# Patient Record
Sex: Female | Born: 1960
Health system: Southern US, Community
[De-identification: ages and names within clinical notes are randomized; demographics above are authoritative.]

## PROBLEM LIST (undated history)

## (undated) DIAGNOSIS — F909 Attention-deficit hyperactivity disorder, unspecified type: Secondary | ICD-10-CM

## (undated) DIAGNOSIS — K635 Polyp of colon: Secondary | ICD-10-CM

## (undated) DIAGNOSIS — K5790 Diverticulosis of intestine, part unspecified, without perforation or abscess without bleeding: Secondary | ICD-10-CM

## (undated) DIAGNOSIS — F32A Depression, unspecified: Secondary | ICD-10-CM

## (undated) DIAGNOSIS — F329 Major depressive disorder, single episode, unspecified: Secondary | ICD-10-CM

## (undated) DIAGNOSIS — E119 Type 2 diabetes mellitus without complications: Secondary | ICD-10-CM

## (undated) DIAGNOSIS — K219 Gastro-esophageal reflux disease without esophagitis: Secondary | ICD-10-CM

## (undated) DIAGNOSIS — K222 Esophageal obstruction: Secondary | ICD-10-CM

## (undated) DIAGNOSIS — F419 Anxiety disorder, unspecified: Secondary | ICD-10-CM

## (undated) DIAGNOSIS — I10 Essential (primary) hypertension: Secondary | ICD-10-CM

## (undated) DIAGNOSIS — E785 Hyperlipidemia, unspecified: Secondary | ICD-10-CM

## (undated) DIAGNOSIS — K449 Diaphragmatic hernia without obstruction or gangrene: Secondary | ICD-10-CM

## (undated) DIAGNOSIS — T7840XA Allergy, unspecified, initial encounter: Secondary | ICD-10-CM

## (undated) HISTORY — PX: COLONOSCOPY: SHX174

## (undated) HISTORY — DX: Diaphragmatic hernia without obstruction or gangrene: K44.9

## (undated) HISTORY — DX: Polyp of colon: K63.5

## (undated) HISTORY — DX: Allergy, unspecified, initial encounter: T78.40XA

## (undated) HISTORY — DX: Attention-deficit hyperactivity disorder, unspecified type: F90.9

## (undated) HISTORY — DX: Esophageal obstruction: K22.2

## (undated) HISTORY — DX: Major depressive disorder, single episode, unspecified: F32.9

## (undated) HISTORY — DX: Essential (primary) hypertension: I10

## (undated) HISTORY — PX: UPPER GASTROINTESTINAL ENDOSCOPY: SHX188

## (undated) HISTORY — DX: Type 2 diabetes mellitus without complications: E11.9

## (undated) HISTORY — DX: Hyperlipidemia, unspecified: E78.5

## (undated) HISTORY — PX: POLYPECTOMY: SHX149

## (undated) HISTORY — DX: Depression, unspecified: F32.A

## (undated) HISTORY — DX: Diverticulosis of intestine, part unspecified, without perforation or abscess without bleeding: K57.90

## (undated) HISTORY — DX: Anxiety disorder, unspecified: F41.9

## (undated) HISTORY — DX: Gastro-esophageal reflux disease without esophagitis: K21.9

## (undated) HISTORY — PX: CARPAL TUNNEL RELEASE: SHX101

---

## 2000-07-22 HISTORY — PX: BACK SURGERY: SHX140

## 2013-08-06 DIAGNOSIS — F4310 Post-traumatic stress disorder, unspecified: Secondary | ICD-10-CM | POA: Diagnosis not present

## 2013-08-11 DIAGNOSIS — F4310 Post-traumatic stress disorder, unspecified: Secondary | ICD-10-CM | POA: Diagnosis not present

## 2013-08-12 DIAGNOSIS — F319 Bipolar disorder, unspecified: Secondary | ICD-10-CM | POA: Diagnosis not present

## 2013-08-12 DIAGNOSIS — F4310 Post-traumatic stress disorder, unspecified: Secondary | ICD-10-CM | POA: Diagnosis not present

## 2013-08-12 DIAGNOSIS — F3110 Bipolar disorder, current episode manic without psychotic features, unspecified: Secondary | ICD-10-CM | POA: Diagnosis not present

## 2013-08-22 HISTORY — PX: SHOULDER SURGERY: SHX246

## 2013-08-26 DIAGNOSIS — F4310 Post-traumatic stress disorder, unspecified: Secondary | ICD-10-CM | POA: Diagnosis not present

## 2013-09-15 DIAGNOSIS — F3110 Bipolar disorder, current episode manic without psychotic features, unspecified: Secondary | ICD-10-CM | POA: Diagnosis not present

## 2013-09-15 DIAGNOSIS — F319 Bipolar disorder, unspecified: Secondary | ICD-10-CM | POA: Diagnosis not present

## 2013-09-22 DIAGNOSIS — F319 Bipolar disorder, unspecified: Secondary | ICD-10-CM | POA: Diagnosis not present

## 2013-09-29 DIAGNOSIS — R42 Dizziness and giddiness: Secondary | ICD-10-CM | POA: Diagnosis not present

## 2013-09-29 DIAGNOSIS — G541 Lumbosacral plexus disorders: Secondary | ICD-10-CM | POA: Diagnosis not present

## 2013-09-29 DIAGNOSIS — M542 Cervicalgia: Secondary | ICD-10-CM | POA: Diagnosis not present

## 2013-09-29 DIAGNOSIS — F519 Sleep disorder not due to a substance or known physiological condition, unspecified: Secondary | ICD-10-CM | POA: Diagnosis not present

## 2013-09-29 DIAGNOSIS — G603 Idiopathic progressive neuropathy: Secondary | ICD-10-CM | POA: Diagnosis not present

## 2013-09-29 DIAGNOSIS — M545 Low back pain, unspecified: Secondary | ICD-10-CM | POA: Diagnosis not present

## 2013-09-29 DIAGNOSIS — Z79899 Other long term (current) drug therapy: Secondary | ICD-10-CM | POA: Diagnosis not present

## 2013-09-29 DIAGNOSIS — G9009 Other idiopathic peripheral autonomic neuropathy: Secondary | ICD-10-CM | POA: Diagnosis not present

## 2013-09-29 DIAGNOSIS — R209 Unspecified disturbances of skin sensation: Secondary | ICD-10-CM | POA: Diagnosis not present

## 2013-09-29 DIAGNOSIS — M25519 Pain in unspecified shoulder: Secondary | ICD-10-CM | POA: Diagnosis not present

## 2013-10-01 ENCOUNTER — Ambulatory Visit (HOSPITAL_COMMUNITY): Admission: RE | Admit: 2013-10-01 | Payer: Medicare Other | Source: Ambulatory Visit

## 2013-10-01 ENCOUNTER — Emergency Department (HOSPITAL_COMMUNITY): Payer: Medicare Other

## 2013-10-01 ENCOUNTER — Emergency Department (HOSPITAL_COMMUNITY)
Admission: EM | Admit: 2013-10-01 | Discharge: 2013-10-01 | Discharge status: Against Medical Advice | Payer: Medicare Other | Attending: Emergency Medicine | Admitting: Emergency Medicine

## 2013-10-01 ENCOUNTER — Encounter (HOSPITAL_COMMUNITY): Payer: Self-pay | Admitting: Emergency Medicine

## 2013-10-01 DIAGNOSIS — G8929 Other chronic pain: Secondary | ICD-10-CM | POA: Diagnosis not present

## 2013-10-01 DIAGNOSIS — M25549 Pain in joints of unspecified hand: Secondary | ICD-10-CM | POA: Insufficient documentation

## 2013-10-01 DIAGNOSIS — Z9889 Other specified postprocedural states: Secondary | ICD-10-CM | POA: Diagnosis not present

## 2013-10-01 DIAGNOSIS — M542 Cervicalgia: Secondary | ICD-10-CM | POA: Insufficient documentation

## 2013-10-01 DIAGNOSIS — R209 Unspecified disturbances of skin sensation: Secondary | ICD-10-CM | POA: Insufficient documentation

## 2013-10-01 DIAGNOSIS — M25519 Pain in unspecified shoulder: Secondary | ICD-10-CM | POA: Diagnosis not present

## 2013-10-01 NOTE — ED Notes (Signed)
Pt states she started having the original pain in August,  Had surgery by Dr Carlyle Dolly in October,  For rotator cuff tear,  States she is worse now than prior to surgery,  Pt is states she is to go to  [place] [facility] follow up appts but doesn't have  Time because she is too busying working,  Pt is tearful in triage and figidty

## 2013-10-01 NOTE — ED Notes (Signed)
Patient left AMA after being seen by a medical provider. She did not alert staff that she was leaving.

## 2013-10-01 NOTE — ED Notes (Signed)
When ask pt about SI and HI she states she has to live for her 52 year old daughter and she is depressed but other than that she doesn't care if she is here,  As stated previous pt is crying and rambling

## 2013-10-02 NOTE — ED Provider Notes (Signed)
CSN: [id number]     Arrival date & time 10/01/13  2045 History   First MD Initiated Contact with Patient 10/01/13 2133     Chief Complaint  Patient presents with  . Shoulder Pain  . Hand Pain     (Consider location/radiation/quality/duration/timing/severity/associated sxs/prior Treatment) HPI Comments: Patient is 53 year old female who presents to the ED with complaints of bilateral shoulder and neck pain - she states that she just moved down here from [facility] where she had been being followed for shoulder pain - she states she had right shoulder rotator cuff repair there and that since the repair she has not had improvement in the pain,  She states that she then had to move here for work and that she has been having left shoulder pain as well.  She states that here she is seeing [facility] for her pain and they want to start doing injections.  She reports also chronic neck pain and numbness and states that the reason for her visit to the ED here is to get a MRI.  She denies any weakness, loss of control of bowels or bladder or change in her chronic pain.  She states she is taking tramadol for pain (did not list this on pain medications)  Patient is a 53 y.o. female presenting with shoulder pain and hand pain. The history is provided by the patient. No language interpreter was used.  Shoulder Pain This is a chronic problem. The current episode started in the past 7 days. The problem occurs constantly. The problem has been unchanged. Associated symptoms include arthralgias, neck pain and numbness. Pertinent negatives include no abdominal pain, anorexia, chest pain, chills, congestion, coughing, fatigue, headaches, joint swelling, nausea, rash, swollen glands, urinary symptoms, vertigo, vomiting or weakness. Nothing aggravates the symptoms. She has tried nothing for the symptoms. The treatment provided no relief.  Hand Pain Associated symptoms include arthralgias, neck pain and numbness. Pertinent  negatives include no abdominal pain, anorexia, chest pain, chills, congestion, coughing, fatigue, headaches, joint swelling, nausea, rash, swollen glands, urinary symptoms, vertigo, vomiting or weakness.    No past medical history on file. Past Surgical History  Procedure Laterality Date  . Shoulder surgery     History reviewed. No pertinent family history. History  Substance Use Topics  . Smoking status: Never Smoker   . Smokeless tobacco: Not on file  . Alcohol Use: No   OB History   Grav Para Term Preterm Abortions TAB SAB Ect Mult Living                 Review of Systems  Constitutional: Negative for chills and fatigue.  HENT: Negative for congestion.   Respiratory: Negative for cough.   Cardiovascular: Negative for chest pain.  Gastrointestinal: Negative for nausea, vomiting, abdominal pain and anorexia.  Musculoskeletal: Positive for arthralgias and neck pain. Negative for joint swelling.  Skin: Negative for rash.  Neurological: Positive for numbness. Negative for vertigo, weakness and headaches.  All other systems reviewed and are negative.      Allergies  Nitroglycerin  Home Medications  No current outpatient prescriptions on file. BP 164/95  Pulse 108  Temp(Src) 98 F (36.7 C) (Oral)  Resp 18  SpO2 99% Physical Exam  Nursing note and vitals reviewed. Constitutional: She is oriented to person, place, and time. She appears well-developed and well-nourished.  Pacing the room, tearful then angry  HENT:  Head: Normocephalic and atraumatic.  Mouth/Throat: Oropharynx is clear and moist.  Eyes:  Conjunctivae are normal. No scleral icterus.  Neck: Normal range of motion. Neck supple. No spinous process tenderness and no muscular tenderness present.  Pulmonary/Chest: Effort normal.  Musculoskeletal:       Right shoulder: She exhibits tenderness, bony tenderness and decreased strength. She exhibits normal range of motion, no spasm and normal pulse.       Left  shoulder: She exhibits tenderness and bony tenderness. She exhibits normal range of motion, normal pulse and normal strength.       Arms: Neurological: She is alert and oriented to person, place, and time. She exhibits normal muscle tone. Coordination normal.  Skin: Skin is warm and dry. No rash noted. No erythema. No pallor.  Psychiatric: She has a normal mood and affect. Her behavior is normal. Judgment and thought content normal.    ED Course  Procedures (including critical care time) Labs Review Labs Reviewed - No data to display Imaging Review No results found.   EKG Interpretation None      MDM   Chronic pain  Patient presents with bilateral shoulder pain and chronic neck pain - she is being followed by the [facility] for pain management and is unhappy with their management of the pain.  When I informed the patient that she would not be getting a MRI tonight, she became more angry and left AMA.  There are no alarming signs to suggest cauda equina or cord compression.    Idalia Needle Joelyn Oms, [name] 10/02/13 [phone]

## 2013-10-02 NOTE — ED Provider Notes (Signed)
Medical screening examination/treatment/procedure(s) were performed by non-physician practitioner and as supervising physician I was immediately available for consultation/collaboration.   EKG Interpretation None        Wandra Arthurs, MD 10/02/13 [phone]

## 2013-10-12 DIAGNOSIS — M5020 Other cervical disc displacement, unspecified cervical region: Secondary | ICD-10-CM | POA: Diagnosis not present

## 2013-10-13 DIAGNOSIS — M542 Cervicalgia: Secondary | ICD-10-CM | POA: Diagnosis not present

## 2013-10-13 DIAGNOSIS — Z79899 Other long term (current) drug therapy: Secondary | ICD-10-CM | POA: Diagnosis not present

## 2013-10-13 DIAGNOSIS — M5412 Radiculopathy, cervical region: Secondary | ICD-10-CM | POA: Diagnosis not present

## 2013-10-18 DIAGNOSIS — F319 Bipolar disorder, unspecified: Secondary | ICD-10-CM | POA: Diagnosis not present

## 2013-10-20 DIAGNOSIS — F4310 Post-traumatic stress disorder, unspecified: Secondary | ICD-10-CM | POA: Diagnosis not present

## 2013-10-22 DIAGNOSIS — M249 Joint derangement, unspecified: Secondary | ICD-10-CM | POA: Diagnosis not present

## 2013-10-22 DIAGNOSIS — M25519 Pain in unspecified shoulder: Secondary | ICD-10-CM | POA: Diagnosis not present

## 2013-10-28 DIAGNOSIS — Z09 Encounter for follow-up examination after completed treatment for conditions other than malignant neoplasm: Secondary | ICD-10-CM | POA: Diagnosis not present

## 2013-10-28 DIAGNOSIS — IMO0002 Reserved for concepts with insufficient information to code with codable children: Secondary | ICD-10-CM | POA: Diagnosis not present

## 2013-10-28 DIAGNOSIS — M75100 Unspecified rotator cuff tear or rupture of unspecified shoulder, not specified as traumatic: Secondary | ICD-10-CM | POA: Diagnosis not present

## 2013-10-28 DIAGNOSIS — R000 Tachycardia, unspecified: Secondary | ICD-10-CM | POA: Diagnosis not present

## 2013-10-29 DIAGNOSIS — Z01818 Encounter for other preprocedural examination: Secondary | ICD-10-CM | POA: Diagnosis not present

## 2013-10-29 DIAGNOSIS — IMO0002 Reserved for concepts with insufficient information to code with codable children: Secondary | ICD-10-CM | POA: Diagnosis not present

## 2013-10-29 DIAGNOSIS — M75100 Unspecified rotator cuff tear or rupture of unspecified shoulder, not specified as traumatic: Secondary | ICD-10-CM | POA: Diagnosis not present

## 2013-11-01 DIAGNOSIS — IMO0002 Reserved for concepts with insufficient information to code with codable children: Secondary | ICD-10-CM | POA: Diagnosis not present

## 2013-11-01 DIAGNOSIS — M75100 Unspecified rotator cuff tear or rupture of unspecified shoulder, not specified as traumatic: Secondary | ICD-10-CM | POA: Diagnosis not present

## 2013-11-01 DIAGNOSIS — Z01818 Encounter for other preprocedural examination: Secondary | ICD-10-CM | POA: Diagnosis not present

## 2013-11-02 DIAGNOSIS — E785 Hyperlipidemia, unspecified: Secondary | ICD-10-CM | POA: Diagnosis not present

## 2013-11-02 DIAGNOSIS — M719 Bursopathy, unspecified: Secondary | ICD-10-CM | POA: Diagnosis not present

## 2013-11-02 DIAGNOSIS — I10 Essential (primary) hypertension: Secondary | ICD-10-CM | POA: Diagnosis not present

## 2013-11-02 DIAGNOSIS — M75120 Complete rotator cuff tear or rupture of unspecified shoulder, not specified as traumatic: Secondary | ICD-10-CM | POA: Diagnosis not present

## 2013-11-02 DIAGNOSIS — Z79899 Other long term (current) drug therapy: Secondary | ICD-10-CM | POA: Diagnosis not present

## 2013-11-02 DIAGNOSIS — F341 Dysthymic disorder: Secondary | ICD-10-CM | POA: Diagnosis not present

## 2013-11-02 DIAGNOSIS — S43439A Superior glenoid labrum lesion of unspecified shoulder, initial encounter: Secondary | ICD-10-CM | POA: Diagnosis not present

## 2013-11-02 DIAGNOSIS — S46819A Strain of other muscles, fascia and tendons at shoulder and upper arm level, unspecified arm, initial encounter: Secondary | ICD-10-CM | POA: Diagnosis not present

## 2013-11-02 DIAGNOSIS — E119 Type 2 diabetes mellitus without complications: Secondary | ICD-10-CM | POA: Diagnosis not present

## 2013-11-02 DIAGNOSIS — M67919 Unspecified disorder of synovium and tendon, unspecified shoulder: Secondary | ICD-10-CM | POA: Diagnosis not present

## 2013-11-09 DIAGNOSIS — M719 Bursopathy, unspecified: Secondary | ICD-10-CM | POA: Diagnosis not present

## 2013-11-09 DIAGNOSIS — M25819 Other specified joint disorders, unspecified shoulder: Secondary | ICD-10-CM | POA: Diagnosis not present

## 2013-11-09 DIAGNOSIS — Z79899 Other long term (current) drug therapy: Secondary | ICD-10-CM | POA: Diagnosis not present

## 2013-11-09 DIAGNOSIS — F3289 Other specified depressive episodes: Secondary | ICD-10-CM | POA: Diagnosis not present

## 2013-11-09 DIAGNOSIS — M24119 Other articular cartilage disorders, unspecified shoulder: Secondary | ICD-10-CM | POA: Diagnosis not present

## 2013-11-09 DIAGNOSIS — I10 Essential (primary) hypertension: Secondary | ICD-10-CM | POA: Diagnosis not present

## 2013-11-09 DIAGNOSIS — E119 Type 2 diabetes mellitus without complications: Secondary | ICD-10-CM | POA: Diagnosis not present

## 2013-11-09 DIAGNOSIS — S43499A Other sprain of unspecified shoulder joint, initial encounter: Secondary | ICD-10-CM | POA: Diagnosis not present

## 2013-11-09 DIAGNOSIS — E785 Hyperlipidemia, unspecified: Secondary | ICD-10-CM | POA: Diagnosis not present

## 2013-11-09 DIAGNOSIS — M67919 Unspecified disorder of synovium and tendon, unspecified shoulder: Secondary | ICD-10-CM | POA: Diagnosis not present

## 2013-11-09 DIAGNOSIS — Z91040 Latex allergy status: Secondary | ICD-10-CM | POA: Diagnosis not present

## 2013-11-09 DIAGNOSIS — F329 Major depressive disorder, single episode, unspecified: Secondary | ICD-10-CM | POA: Diagnosis not present

## 2013-12-09 DIAGNOSIS — N912 Amenorrhea, unspecified: Secondary | ICD-10-CM | POA: Diagnosis not present

## 2013-12-09 DIAGNOSIS — Z0000 Encounter for general adult medical examination without abnormal findings: Secondary | ICD-10-CM | POA: Diagnosis not present

## 2013-12-09 DIAGNOSIS — E119 Type 2 diabetes mellitus without complications: Secondary | ICD-10-CM | POA: Diagnosis not present

## 2013-12-09 DIAGNOSIS — E785 Hyperlipidemia, unspecified: Secondary | ICD-10-CM | POA: Diagnosis not present

## 2013-12-09 DIAGNOSIS — R319 Hematuria, unspecified: Secondary | ICD-10-CM | POA: Diagnosis not present

## 2013-12-09 DIAGNOSIS — R109 Unspecified abdominal pain: Secondary | ICD-10-CM | POA: Diagnosis not present

## 2013-12-23 DIAGNOSIS — L909 Atrophic disorder of skin, unspecified: Secondary | ICD-10-CM | POA: Diagnosis not present

## 2013-12-23 DIAGNOSIS — F341 Dysthymic disorder: Secondary | ICD-10-CM | POA: Diagnosis not present

## 2013-12-23 DIAGNOSIS — E782 Mixed hyperlipidemia: Secondary | ICD-10-CM | POA: Diagnosis not present

## 2013-12-23 DIAGNOSIS — L919 Hypertrophic disorder of the skin, unspecified: Secondary | ICD-10-CM | POA: Diagnosis not present

## 2013-12-23 DIAGNOSIS — E119 Type 2 diabetes mellitus without complications: Secondary | ICD-10-CM | POA: Diagnosis not present

## 2013-12-27 DIAGNOSIS — M542 Cervicalgia: Secondary | ICD-10-CM | POA: Diagnosis not present

## 2013-12-27 DIAGNOSIS — M5430 Sciatica, unspecified side: Secondary | ICD-10-CM | POA: Diagnosis not present

## 2013-12-27 DIAGNOSIS — M5412 Radiculopathy, cervical region: Secondary | ICD-10-CM | POA: Diagnosis not present

## 2013-12-27 DIAGNOSIS — G540 Brachial plexus disorders: Secondary | ICD-10-CM | POA: Diagnosis not present

## 2013-12-27 DIAGNOSIS — Z79899 Other long term (current) drug therapy: Secondary | ICD-10-CM | POA: Diagnosis not present

## 2013-12-27 DIAGNOSIS — M545 Low back pain, unspecified: Secondary | ICD-10-CM | POA: Diagnosis not present

## 2013-12-27 DIAGNOSIS — M533 Sacrococcygeal disorders, not elsewhere classified: Secondary | ICD-10-CM | POA: Diagnosis not present

## 2013-12-27 DIAGNOSIS — G894 Chronic pain syndrome: Secondary | ICD-10-CM | POA: Diagnosis not present

## 2014-01-19 ENCOUNTER — Ambulatory Visit (INDEPENDENT_AMBULATORY_CARE_PROVIDER_SITE_OTHER): Payer: Medicare Other | Admitting: Family Medicine

## 2014-01-19 VITALS — BP 112/76 | HR 84 | Temp 97.9°F | Resp 16 | Ht 58.5 in | Wt <= 1120 oz

## 2014-01-19 DIAGNOSIS — R300 Dysuria: Secondary | ICD-10-CM | POA: Diagnosis not present

## 2014-01-19 DIAGNOSIS — N3001 Acute cystitis with hematuria: Secondary | ICD-10-CM

## 2014-01-19 DIAGNOSIS — N3000 Acute cystitis without hematuria: Secondary | ICD-10-CM | POA: Diagnosis not present

## 2014-01-19 LAB — POCT UA - MICROSCOPIC ONLY
CASTS, UR, LPF, POC: NEGATIVE
Crystals, Ur, HPF, POC: NEGATIVE
Mucus, UA: NEGATIVE
Yeast, UA: NEGATIVE

## 2014-01-19 LAB — POCT URINALYSIS DIPSTICK
Bilirubin, UA: NEGATIVE
GLUCOSE UA: NEGATIVE
Ketones, UA: NEGATIVE
NITRITE UA: NEGATIVE
Protein, UA: 30
Spec Grav, UA: 1.02
UROBILINOGEN UA: 0.2
pH, UA: 5

## 2014-01-19 MED ORDER — CEPHALEXIN 500 MG PO CAPS: 500.0000 mg | ORAL_CAPSULE | Freq: Two times a day (BID) | ORAL | Status: DC

## 2014-01-19 NOTE — Progress Notes (Signed)
[facility] [address], [place] [address] [phone]  Date:  01/19/2014   Name:  Amy Glass   DOB:  Apr 17, 1961   MRN:  [id number]  PCP:  No PCP Per Patient    Chief Complaint: Urinary Tract Infection   History of Present Illness:  Amy Glass is a 53 y.o. very pleasant female patient who presents with the following:  Here today as a new patient for possible UTI.  She has noted burning with urination for about 3 days. She has also noted frequency.  She has noted hematuria- trace of blood on TP after wiping- as well.  She did have a menstrual period on 12/30/13.  No nausea, vomiting, no fever.  She did have abdominal pain a couple of days ago, now resolved.  No vaginal sx  History of DM managed by the [facility].   Otherwise doing well today  There are no active problems to display for this patient.   Past Medical History  Diagnosis Date  . Anxiety   . Depression   . Diabetes mellitus without complication     Past Surgical History  Procedure Laterality Date  . Shoulder surgery    . Cesarean section      History  Substance Use Topics  . Smoking status: Never Smoker   . Smokeless tobacco: Not on file  . Alcohol Use: No    Family History  Problem Relation Age of Onset  . Diabetes Mother   . Heart disease Mother   . Hyperlipidemia Mother   . Hypertension Mother     Allergies  Allergen Reactions  . Nitroglycerin Other (See Comments)    Migraines, (only tried nitro patch. Has never tried the pills)    Medication list has been reviewed and updated.  No current outpatient prescriptions on file prior to visit.   No current facility-administered medications on file prior to visit.    Review of Systems:  As per HPI- otherwise negative.   Physical Examination: Filed Vitals:   01/19/14 1057  BP: 112/76  Pulse: 84  Temp: 97.9 F (36.6 C)  Resp: 16   Filed Vitals:   01/19/14 1057  Height: 4' 10.5" (1.486 m)  Weight:  15 lb 6.4 oz (6.985 kg)   Body mass index is 3.16 kg/(m^2). Ideal Body Weight: Weight in (lb) to have BMI = 25: 121.4  Noted erroneous weight entry above.  Would estimate that she weighs around 150 lbs GEN: WDWN, NAD, Non-toxic, A & O x 3 HEENT: Atraumatic, Normocephalic. Neck supple. No masses, No LAD. Ears and Nose: No external deformity. CV: RRR, No M/G/R. No JVD. No thrill. No extra heart sounds. PULM: CTA B, no wheezes, crackles, rhonchi. No retractions. No resp. distress. No accessory muscle use. ABD: S, NT, ND. No rebound. No HSM.  No CVA tenderness  EXTR: No c/c/e NEURO Normal gait.  PSYCH: Normally interactive. Conversant. Not depressed or anxious appearing.  Calm demeanor.    Results for orders placed in visit on 01/19/14  POCT URINALYSIS DIPSTICK      Result Value Ref Range   Color, UA yellow     Clarity, UA clear     Glucose, UA neg     Bilirubin, UA neg     Ketones, UA neg     Spec Grav, UA 1.020     Blood, UA moderate     pH, UA 5.0     Protein, UA 30     Urobilinogen,  UA 0.2     Nitrite, UA neg     Leukocytes, UA Trace    POCT UA - MICROSCOPIC ONLY      Result Value Ref Range   WBC, Ur, HPF, POC 5-7     RBC, urine, microscopic 1-3     Bacteria, U Microscopic trace     Mucus, UA neg     Epithelial cells, urine per micros 0-1     Crystals, Ur, HPF, POC neg     Casts, Ur, LPF, POC neg     Yeast, UA neg      Assessment and Plan: Acute cystitis with hematuria  Burning with urination - Plan: POCT urinalysis dipstick, POCT UA - Microscopic Only, cephALEXin (KEFLEX) 500 MG capsule, Urine culture  Treat for likely UTI with keflex.  She does describe some blood on TP after wiping so will need follow-up if ucx is negative.   See patient instructions for more details.     Signed Lamar Blinks, MD

## 2014-01-19 NOTE — Patient Instructions (Signed)
We are going to treat you for a UTI with keflex (antibiotic)- take this twice a day for one week.  At the drug store purchase some pyridium (azo is one brand name) to help with your discomfort.  It will make your urine orange!  Let me know if you do not feel better in the next couple of days Sooner if worse.

## 2014-01-22 ENCOUNTER — Encounter: Payer: Self-pay | Admitting: Family Medicine

## 2014-01-22 LAB — URINE CULTURE

## 2014-01-26 DIAGNOSIS — E119 Type 2 diabetes mellitus without complications: Secondary | ICD-10-CM | POA: Diagnosis not present

## 2014-01-26 DIAGNOSIS — E785 Hyperlipidemia, unspecified: Secondary | ICD-10-CM | POA: Diagnosis not present

## 2014-03-14 DIAGNOSIS — F319 Bipolar disorder, unspecified: Secondary | ICD-10-CM | POA: Diagnosis not present

## 2014-03-29 DIAGNOSIS — R82998 Other abnormal findings in urine: Secondary | ICD-10-CM | POA: Diagnosis not present

## 2014-03-29 DIAGNOSIS — N390 Urinary tract infection, site not specified: Secondary | ICD-10-CM | POA: Diagnosis not present

## 2014-06-06 DIAGNOSIS — F319 Bipolar disorder, unspecified: Secondary | ICD-10-CM | POA: Diagnosis not present

## 2014-07-19 DIAGNOSIS — E139 Other specified diabetes mellitus without complications: Secondary | ICD-10-CM | POA: Diagnosis not present

## 2014-07-19 DIAGNOSIS — E0840 Diabetes mellitus due to underlying condition with diabetic neuropathy, unspecified: Secondary | ICD-10-CM | POA: Diagnosis not present

## 2014-07-19 DIAGNOSIS — D229 Melanocytic nevi, unspecified: Secondary | ICD-10-CM | POA: Diagnosis not present

## 2014-07-19 DIAGNOSIS — E785 Hyperlipidemia, unspecified: Secondary | ICD-10-CM | POA: Diagnosis not present

## 2014-07-20 ENCOUNTER — Other Ambulatory Visit: Payer: Self-pay | Admitting: Specialist

## 2014-07-20 ENCOUNTER — Other Ambulatory Visit: Payer: Self-pay

## 2014-07-20 DIAGNOSIS — Z1231 Encounter for screening mammogram for malignant neoplasm of breast: Secondary | ICD-10-CM

## 2014-07-26 ENCOUNTER — Ambulatory Visit
Admission: RE | Admit: 2014-07-26 | Discharge: 2014-07-26 | Disposition: A | Discharge status: Home / Self Care | Payer: Medicare Other | Source: Ambulatory Visit | Attending: Specialist | Admitting: Specialist

## 2014-07-26 ENCOUNTER — Encounter (INDEPENDENT_AMBULATORY_CARE_PROVIDER_SITE_OTHER): Payer: Self-pay

## 2014-07-26 ENCOUNTER — Ambulatory Visit: Payer: Medicare Other

## 2014-07-26 DIAGNOSIS — Z1231 Encounter for screening mammogram for malignant neoplasm of breast: Secondary | ICD-10-CM

## 2014-08-02 DIAGNOSIS — E119 Type 2 diabetes mellitus without complications: Secondary | ICD-10-CM | POA: Diagnosis not present

## 2014-08-02 DIAGNOSIS — E785 Hyperlipidemia, unspecified: Secondary | ICD-10-CM | POA: Diagnosis not present

## 2014-08-02 DIAGNOSIS — D229 Melanocytic nevi, unspecified: Secondary | ICD-10-CM | POA: Diagnosis not present

## 2014-08-02 DIAGNOSIS — R1310 Dysphagia, unspecified: Secondary | ICD-10-CM | POA: Diagnosis not present

## 2014-08-09 DIAGNOSIS — D229 Melanocytic nevi, unspecified: Secondary | ICD-10-CM | POA: Diagnosis not present

## 2014-08-09 DIAGNOSIS — K220 Achalasia of cardia: Secondary | ICD-10-CM | POA: Diagnosis not present

## 2014-08-23 DIAGNOSIS — N951 Menopausal and female climacteric states: Secondary | ICD-10-CM | POA: Diagnosis not present

## 2014-08-23 DIAGNOSIS — L918 Other hypertrophic disorders of the skin: Secondary | ICD-10-CM | POA: Diagnosis not present

## 2014-08-23 DIAGNOSIS — Z124 Encounter for screening for malignant neoplasm of cervix: Secondary | ICD-10-CM | POA: Diagnosis not present

## 2014-08-23 DIAGNOSIS — R1310 Dysphagia, unspecified: Secondary | ICD-10-CM | POA: Diagnosis not present

## 2014-09-13 DIAGNOSIS — K760 Fatty (change of) liver, not elsewhere classified: Secondary | ICD-10-CM | POA: Diagnosis not present

## 2014-09-13 DIAGNOSIS — M549 Dorsalgia, unspecified: Secondary | ICD-10-CM | POA: Diagnosis not present

## 2014-09-14 DIAGNOSIS — K5900 Constipation, unspecified: Secondary | ICD-10-CM | POA: Diagnosis not present

## 2014-09-14 DIAGNOSIS — F319 Bipolar disorder, unspecified: Secondary | ICD-10-CM | POA: Diagnosis not present

## 2014-09-14 DIAGNOSIS — R109 Unspecified abdominal pain: Secondary | ICD-10-CM | POA: Diagnosis not present

## 2014-09-14 DIAGNOSIS — K760 Fatty (change of) liver, not elsewhere classified: Secondary | ICD-10-CM | POA: Diagnosis not present

## 2014-09-21 ENCOUNTER — Encounter: Payer: Self-pay | Admitting: Specialist

## 2014-09-25 DIAGNOSIS — R05 Cough: Secondary | ICD-10-CM | POA: Diagnosis not present

## 2014-09-25 DIAGNOSIS — B9789 Other viral agents as the cause of diseases classified elsewhere: Secondary | ICD-10-CM | POA: Diagnosis not present

## 2014-09-25 DIAGNOSIS — R0989 Other specified symptoms and signs involving the circulatory and respiratory systems: Secondary | ICD-10-CM | POA: Diagnosis not present

## 2014-09-25 DIAGNOSIS — I10 Essential (primary) hypertension: Secondary | ICD-10-CM | POA: Diagnosis not present

## 2014-09-25 DIAGNOSIS — J069 Acute upper respiratory infection, unspecified: Secondary | ICD-10-CM | POA: Diagnosis not present

## 2014-10-30 DIAGNOSIS — M542 Cervicalgia: Secondary | ICD-10-CM | POA: Diagnosis not present

## 2014-10-30 DIAGNOSIS — M545 Low back pain: Secondary | ICD-10-CM | POA: Diagnosis not present

## 2014-11-09 ENCOUNTER — Emergency Department (HOSPITAL_COMMUNITY)
Admission: EM | Admit: 2014-11-09 | Discharge: 2014-11-09 | Disposition: A | Discharge status: Home / Self Care | Payer: Medicare Other | Attending: Emergency Medicine | Admitting: Emergency Medicine

## 2014-11-09 ENCOUNTER — Telehealth: Payer: Self-pay | Admitting: *Deleted

## 2014-11-09 ENCOUNTER — Encounter (HOSPITAL_COMMUNITY): Payer: Self-pay | Admitting: Emergency Medicine

## 2014-11-09 DIAGNOSIS — F419 Anxiety disorder, unspecified: Secondary | ICD-10-CM | POA: Diagnosis not present

## 2014-11-09 DIAGNOSIS — G8929 Other chronic pain: Secondary | ICD-10-CM | POA: Insufficient documentation

## 2014-11-09 DIAGNOSIS — F329 Major depressive disorder, single episode, unspecified: Secondary | ICD-10-CM | POA: Diagnosis not present

## 2014-11-09 DIAGNOSIS — M545 Low back pain: Secondary | ICD-10-CM | POA: Insufficient documentation

## 2014-11-09 DIAGNOSIS — E119 Type 2 diabetes mellitus without complications: Secondary | ICD-10-CM | POA: Insufficient documentation

## 2014-11-09 DIAGNOSIS — G5601 Carpal tunnel syndrome, right upper limb: Secondary | ICD-10-CM | POA: Insufficient documentation

## 2014-11-09 DIAGNOSIS — Z79899 Other long term (current) drug therapy: Secondary | ICD-10-CM | POA: Diagnosis not present

## 2014-11-09 MED ORDER — CYCLOBENZAPRINE HCL 10 MG PO TABS: 10.0000 mg | ORAL_TABLET | Freq: Two times a day (BID) | ORAL | Status: DC | PRN

## 2014-11-09 MED ORDER — MELOXICAM 15 MG PO TABS: 15.0000 mg | ORAL_TABLET | Freq: Every day | ORAL | Status: DC

## 2014-11-09 NOTE — Discharge Instructions (Signed)
1. Medications: robaxin, naproxyn, vicodin, usual home medications 2. Treatment: rest, drink plenty of fluids, gentle stretching as discussed, alternate ice and heat 3. Follow Up: Please followup with your primary doctor in 3 days for discussion of your diagnoses and further evaluation after today's visit; if you do not have a primary care doctor use the resource guide provided to find one;  Return to the ER for worsening back pain, difficulty walking, loss of bowel or bladder control or other concerning symptoms     Back Exercises Back exercises help treat and prevent back injuries. The goal of back exercises is to increase the strength of your abdominal and back muscles and the flexibility of your back. These exercises should be started when you no longer have back pain. Back exercises include:  Pelvic Tilt. Lie on your back with your knees bent. Tilt your pelvis until the lower part of your back is against the floor. Hold this position 5 to 10 sec and repeat 5 to 10 times.  Knee to Chest. Pull first 1 knee up against your chest and hold for 20 to 30 seconds, repeat this with the other knee, and then both knees. This may be done with the other leg straight or bent, whichever feels better.  Sit-Ups or Curl-Ups. Bend your knees 90 degrees. Start with tilting your pelvis, and do a partial, slow sit-up, lifting your trunk only 30 to 45 degrees off the floor. Take at least 2 to 3 seconds for each sit-up. Do not do sit-ups with your knees out straight. If partial sit-ups are difficult, simply do the above but with only tightening your abdominal muscles and holding it as directed.  Hip-Lift. Lie on your back with your knees flexed 90 degrees. Push down with your feet and shoulders as you raise your hips a couple inches off the floor; hold for 10 seconds, repeat 5 to 10 times.  Back arches. Lie on your stomach, propping yourself up on bent elbows. Slowly press on your hands, causing an arch in your low  back. Repeat 3 to 5 times. Any initial stiffness and discomfort should lessen with repetition over time.  Shoulder-Lifts. Lie face down with arms beside your body. Keep hips and torso pressed to floor as you slowly lift your head and shoulders off the floor. Do not overdo your exercises, especially in the beginning. Exercises may cause you some mild back discomfort which lasts for a few minutes; however, if the pain is more severe, or lasts for more than 15 minutes, do not continue exercises until you see your caregiver. Improvement with exercise therapy for back problems is slow.  See your caregivers for assistance with developing a proper back exercise program. Document Released: 08/15/2004 Document Revised: 09/30/2011 Document Reviewed: 05/09/2011 [facility] Patient Information 2015 [place], [place]. This information is not intended to replace advice given to you by your health care provider. Make sure you discuss any questions you have with your health care provider.    Emergency Department Resource Guide 1) Find a Doctor and Pay Out of Pocket Although you won't have to find out who is covered by your insurance plan, it is a good idea to ask around and get recommendations. You will then need to call the office and see if the doctor you have chosen will accept you as a new patient and what types of options they offer for patients who are self-pay. Some doctors offer discounts or will set up payment plans for their patients who do not  have insurance, but you will need to ask so you aren't surprised when you get to your appointment.  2) Contact Your Local Health Department Not all health departments have doctors that can see patients for sick visits, but many do, so it is worth a call to see if yours does. If you don't know where your local health department is, you can check in your phone book. The CDC also has a tool to help you locate your state's health department, and many state websites also have  listings of all of their local health departments.  3) Find a [facility] If your illness is not likely to be very severe or complicated, you may want to try a walk in clinic. These are popping up all over the country in pharmacies, drugstores, and shopping centers. They're usually staffed by nurse practitioners or physician assistants that have been trained to treat common illnesses and complaints. They're usually fairly quick and inexpensive. However, if you have serious medical issues or chronic medical problems, these are probably not your best option.  No Primary Care Doctor: - Call Health Connect at  [phone] - they can help you locate a primary care doctor that  accepts your insurance, provides certain services, etc. - Physician Referral Service- [phone]  Chronic Pain Problems: Organization         Address  Phone   Notes  [facility]  [phone] Patients need to be referred by their primary care doctor.   Medication Assistance: Organization         Address  Phone   Notes  [facility] Medication [facility] [address] [place], [place] [address] [phone] --Must be a resident of [facility] -- Must have NO insurance coverage whatsoever (no Medicaid/ Medicare, etc.) -- The pt. MUST have a primary care doctor that directs their care regularly and follows them in the community   [facility]ist  [phone]   [employer]  [phone]    Agencies that provide inexpensive medical care: Organization         Address  Phone   Notes  [place]  [phone]   [facility]    [phone]   [facility] [place], [place] [address] [phone]   [place] [address] [place] [phone]   Planned Parenthood    [phone]   [facility]    [phone]   [place] and [place]  [address], [place] Phone:  [phone], Fax:  [phone] Hours of Operation:  9 am - 6 pm, M-F.  Also accepts Medicaid/Medicare and self-pay.  [facility] for [place], [address],  Phone: [phone], Fax: [phone]. Hours of Operation:  8:30 am - 5:30 pm, M-F.  Also accepts Medicaid and self-pay.  [facility] [address] [place] Phone: [phone]   [place], [place], [place] [phone], Ext. [phone] Mondays & Thursdays: 7-9 AM.  First 15 patients are seen on a first come, first serve basis.    [place] Providers:  Organization         Address  Phone   Notes  [facility] [address],  [phone] Also accepts self-pay patients.  [place]  [address] Dolores Patty [place], [place]  [phone]   [place], [address], [place] [phone]   [place] [address], [place] [phone]   Lucianne Lei [address], [place]   [phone] Only accepts [place] Access [place] patients after they have their name applied to their card.   Self-Pay (no insurance) in [facility]:  Organization         Address  Phone   Notes  Sickle Cell Patients, [facility] [place] [phone]   [facility] [place] [phone]   [facility] [place]  [place], [place], [place] [phone]   [facility]/Dr. Osei-Bonsu  [address] [place] or [address], [place] [phone] Phone number for both [place] and [place] locations is the same.  Urgent Medical and [facility] [address], [place] [phone]   [facility] [address],  [place] or [address] [phone]   [facility] [address] [place] [phone], phone; [phone], fax Sees patients 1st and 3rd Saturday of every month.  Must not qualify for public or private insurance (i.e. Medicaid, Medicare, [place] Health Choice, Veterans' Benefits)  Household income should be no more than 200% of the poverty level The clinic cannot treat you if you are pregnant or think you are pregnant  Sexually transmitted diseases are not treated at the clinic.    Dental Care: Organization         Address  Phone  Notes  [facility] Department of [facility] [phone] Accepts children up to age 70 who are enrolled in [place] or [place]; pregnant women with a Medicaid card; and children who have applied for Medicaid or [place] Health Choice, but were declined, whose parents can pay a reduced fee at time of service.  [facility] Department of [facility]  [address], [place] [phone] Accepts children up to age 49 who are enrolled in [place] or [place]; pregnant women with a Medicaid card; and children who have applied for Medicaid or  Health Choice, but were declined, whose parents can pay a reduced fee at time of service.  [place] Adult Dental Access PROGRAM  [place] [phone] Patients are seen by appointment only. Walk-ins are not accepted. [place] will see patients 58 years of age and older. Monday - Tuesday (8am-5pm) Most Wednesdays (8:30-5pm) $30 per visit, cash only  [facility] Adult Dental Access PROGRAM  [address], [facility] [phone] Patients are seen by appointment only. Walk-ins are not accepted. [place] will see patients 72 years of age and older. One Wednesday Evening (Monthly: Volunteer Based).  $30 per visit, cash only  [place]  [phone] for adults; Children under age 37, call Graduate Pediatric Dentistry at [phone]. Children aged 3-14, please call [phone] to request a pediatric application.  Dental services are provided in all areas of dental care including fillings, crowns and bridges, complete and partial dentures, implants, gum treatment, root canals, and extractions. Preventive care is also provided. Treatment is provided to both adults and children. Patients are selected  via a lottery and there is often a waiting list.   [facility] [address], [place]  [phone] [email or web address]   [facility] [address] [place], [place] [phone], Ext. [phone] Second and Fourth Thursday of each month, opens at 6:30 AM; Clinic ends at 9 AM.  Patients are seen on a first-come first-served basis, and a limited number are seen during each clinic.   [facility]  [address] Hillard Danker [place], [place] [phone]   Eligibility Requirements You must have lived in [place], [place], or [place] counties for at least the last three months.   You cannot be eligible for state or federal sponsored [employer], including [employer], [place], or [employer].   You generally cannot be eligible for healthcare insurance through your employer.    How to apply: Eligibility screenings are held every Tuesday and Wednesday afternoon from 1:00 pm until 4:00 pm. You do not need an appointment for the interview!  [facility] [address], [place], [place]   [place]  [place] Department  [place]  [phone]    Behavioral Health Resources in the Community: Intensive Outpatient Programs Organization         Address  Phone  Notes  [place] [facility]. [address] [place], [place] [phone]     [facility] Outpatient [address] [name], [place]   ADS: Alcohol & Drug S[facility] [address] [place], [place]   [place] [address],  [place], [place] or [phone]   Substance Abuse Resources Organization         Address  Phone  Notes  Alcohol and Drug Services  [phone]   [place]  [phone]   [facility] [place]   Chinita Pester  [phone]   Residential & Outpatient Substance Abuse Program  [phone]   Psychological Services Organization         Address  Phone  Notes  [facility] [place]  [place]  [phone]   [place] [address], [place] or [phone]    Mobile Crisis Teams Organization         Address  Phone  Notes  [facility]atives, Mobile Crisis Care Unit  [phone]   [facility]  [address] [place], [place]   [place] [address], [place] [phone]    Self-Help/Support Groups Organization         Address  Phone             Notes  [name]. of [place] - variety of support groups  [place] Call for more information  Narcotics Anonymous (NA), [facility] [address], [employer] [place]  2 meetings at this location   Special educational needs teacher         Address  Phone  Notes  ASAP Residential Treatment [place],    [place]  [phone]   [facility]  [address] [place] [address]24097, [place], [place]   [place], [place] [phone] Admissions: 8am-3pm M-F  Incentives Substance [place] [address],    [place], [place]   [facility] [name]  Jadene Pierini [place], [place]   [facility] [address],  [place],  [place]   [facility] - Intensive Outpatient [address] Kristeen [address], [place], [place]   [facility] ([place].) [place].,  [place], [place] [phone] or [phone]   [facility] Services (RTS) [address], [place], [place] Accepts Medicaid  [facility] [place] [address]  [place] [phone] Substance Abuse/Addiction Treatment   [facility] [place] Organization         Address  Phone  Notes  [facility]  [phone]   Domenic Schwab, PhD [name] Arlis Porta [place], [place]   [phone] or [phone]   [place], [place] [phone]   [facility] [address], [place], [place] [phone] Insurance/Medicaid/sponsorship through [facility] and Families [address] [place]                                    [place], [place] [phone] [place] [address], [place] [phone]    Dr. Adele Schilder  [phone]   [facility] Dept. 1) [address] [place] 2) [place] 3)  [place] [address], [place] [phone]  [phone]   [place] [phone] or [phone] (After Hours)

## 2014-11-09 NOTE — Telephone Encounter (Signed)
Patient called to make a new patient appointment.  Transferred her to scheduler

## 2014-11-09 NOTE — ED Provider Notes (Signed)
CSN: [id number]     Arrival date & time 11/09/14  1511 History   This chart is scribed for non-physician practitioner, Abigail Butts, PA-C, working with Noemi Chapel, MD by Chester Holstein, ED Scribe.  This patient was seen in room TR05C/TR05C and the patient's care was started 6:05 PM.      Chief Complaint  Patient presents with  . Back Pain     Patient is a 54 y.o. female presenting with back pain. The history is provided by the patient and medical records. No language interpreter was used.  Back Pain Associated symptoms: numbness   Associated symptoms: no abdominal pain, no bladder incontinence, no bowel incontinence, no chest pain, no dysuria, no fever, no headaches, no paresthesias, no perianal numbness and no weakness    HPI Comments: Amy Glass is a 54 y.o. female with PMHx of DM and anxiety who presents to the Emergency Department complaining of chronic back pain with radiation to neck worsening over the last month. Pt states she had back surgery in 2002 and bilateral shoulder surgery last year. Pt notes associated waxing and waning numbness and tingling in right hand for 4 months. Pt is right handed. Pt has an appointment to see an orthopedist on 11/21/14. Pt has not been taking any muscle relaxers. Pt denies h/o CA, IVDA, use of blood thinners, and prolonged steroid use. Pt denies numbness and tingling in bilateral LEs, weakness, fever, chills, injury to neck, fall, and bowel or bladder incontinence.  Pt does not have a PCP currently.  Patient reports she spends most of her day at work bending and lifting. She reports that after work her back hurts significantly more.    Past Medical History  Diagnosis Date  . Anxiety   . Depression   . Diabetes mellitus without complication    Past Surgical History  Procedure Laterality Date  . Shoulder surgery    . Cesarean section    . Back surgery  2002   Family History  Problem Relation Age of Onset  . Diabetes Mother   .  Heart disease Mother   . Hyperlipidemia Mother   . Hypertension Mother    History  Substance Use Topics  . Smoking status: Never Smoker   . Smokeless tobacco: Not on file  . Alcohol Use: No   OB History    No data available     Review of Systems  Constitutional: Negative for fever, chills and fatigue.  Respiratory: Negative for chest tightness and shortness of breath.   Cardiovascular: Negative for chest pain.  Gastrointestinal: Negative for nausea, vomiting, abdominal pain, diarrhea and bowel incontinence.  Genitourinary: Negative for bladder incontinence, dysuria, urgency, frequency and hematuria.  Musculoskeletal: Positive for back pain. Negative for joint swelling, gait problem, neck pain and neck stiffness.  Skin: Negative for rash.  Neurological: Positive for numbness. Negative for weakness, light-headedness, headaches and paresthesias.  All other systems reviewed and are negative.     Allergies  Nitroglycerin  Home Medications   Prior to Admission medications   Medication Sig Start Date End Date Taking? Authorizing Provider  Cyanocobalamin (VITAMIN B-12 PO) Take 1 tablet by mouth daily.   Yes Historical Provider, MD  DULoxetine (CYMBALTA) 60 MG capsule Take 60 mg by mouth daily. 09/24/14  Yes Historical Provider, MD  FOLIC ACID PO Take 1 tablet by mouth at bedtime.   Yes Historical Provider, MD  ibuprofen (ADVIL,MOTRIN) 200 MG tablet Take 400 mg by mouth every 6 (six) hours as needed for  mild pain or moderate pain.   Yes Historical Provider, MD  lisinopril (PRINIVIL,ZESTRIL) 10 MG tablet Take 10 mg by mouth daily.   Yes Historical Provider, MD  OVER THE COUNTER MEDICATION Apply 1 application topically daily as needed (spots on face).   Yes Historical Provider, MD  Oxymetazoline HCl (NASAL SPRAY NA) Place 1-2 sprays into both nostrils daily as needed (allergies).   Yes Historical Provider, MD  traZODone (DESYREL) 50 MG tablet Take 50 mg by mouth at bedtime. 10/08/14  Yes  Historical Provider, MD  cephALEXin (KEFLEX) 500 MG capsule Take 1 capsule (500 mg total) by mouth 2 (two) times daily. Patient not taking: Reported on 11/09/2014 01/19/14   Darreld Mclean, MD  cyclobenzaprine (FLEXERIL) 10 MG tablet Take 1 tablet (10 mg total) by mouth 2 (two) times daily as needed for muscle spasms. 11/09/14   Jamea Robicheaux, PA-C  meloxicam (MOBIC) 15 MG tablet Take 1 tablet (15 mg total) by mouth daily. 11/09/14   Kimoni Pagliarulo, PA-C   BP 124/80 mmHg  Pulse 88  Temp(Src) 98.8 F (37.1 C) (Oral)  Resp 16  Ht 4\' 11"  (1.499 m)  Wt 145 lb (65.772 kg)  BMI 29.27 kg/m2  SpO2 100% Physical Exam  Constitutional: She is oriented to person, place, and time. She appears well-developed and well-nourished. No distress.  HENT:  Head: Normocephalic and atraumatic.  Mouth/Throat: Oropharynx is clear and moist. No oropharyngeal exudate.  Eyes: Conjunctivae are normal.  Neck: Normal range of motion. Neck supple.  Full ROM without pain  Cardiovascular: Normal rate, regular rhythm, normal heart sounds and intact distal pulses.   No murmur heard. Pulmonary/Chest: Effort normal and breath sounds normal. No respiratory distress. She has no wheezes.  Abdominal: Soft. She exhibits no distension. There is no tenderness.  Musculoskeletal:  Full range of motion of the T-spine and L-spine No tenderness to palpation of the spinous processes of the T-spine or L-spine No tenderness to palpation of the paraspinous muscles of the L-spine Pain to palpation over the right SI joint.  Negative axial load test; positive Phalen's and Tinel's  Lymphadenopathy:    She has no cervical adenopathy.  Neurological: She is alert and oriented to person, place, and time.  Reflex Scores:      Bicep reflexes are 2+ on the right side and 2+ on the left side.      Brachioradialis reflexes are 2+ on the right side and 2+ on the left side.      Patellar reflexes are 1+ on the right side and 1+ on the  left side.      Achilles reflexes are 2+ on the right side and 2+ on the left side. Speech is clear and goal oriented, follows commands Normal 5/5 strength in upper and lower extremities bilaterally including dorsiflexion and plantar flexion, strong and equal grip strength Sensation normal to light and sharp touch Moves extremities without ataxia, coordination intact Normal gait Normal balance No Clonus   Skin: Skin is warm and dry. No rash noted. She is not diaphoretic. No erythema.  Psychiatric: She has a normal mood and affect. Her behavior is normal.  Nursing note and vitals reviewed.   ED Course  Procedures (including critical care time) DIAGNOSTIC STUDIES: Oxygen Saturation is 100% on room air, normal by my interpretation.    COORDINATION OF CARE: 6:14 PM Discussed treatment plan with patient at beside, the patient agrees with the plan and has no further questions at this time.   Labs Review  Labs Reviewed - No data to display  Imaging Review No results found.   EKG Interpretation None      MDM   Final diagnoses:  Chronic low back pain  Carpal tunnel syndrome of right wrist   Melena L Shibley presents with c/o acute exacerbation of chronic back pain.  Patient with back pain.  No neurological deficits and normal neuro exam.  Patient can walk but states is painful.  No loss of bowel or bladder control.  No concern for cauda equina.  No fever, night sweats, weight loss, h/o cancer, IVDU.  Patient's clinical exam is consistent with carpal tunnel and her paresthesias are isolated to her right hand. They are not involved in her right shoulder and are not reproducible with cervical manipulation. RICE protocol was relaxers indicated and discussed with patient. Patient requests narcotics multiple times. I do not feel it is taped to treat her pain with narcotic pain medicine. Recommend she follow-up with primary care in orthopedics for further discussion of pain control.  BP  124/80 mmHg  Pulse 88  Temp(Src) 98.8 F (37.1 C) (Oral)  Resp 16  Ht 4\' 11"  (1.499 m)  Wt 145 lb (65.772 kg)  BMI 29.27 kg/m2  SpO2 100%  I personally performed the services described in this documentation, which was scribed in my presence. The recorded information has been reviewed and is accurate.    Jarrett Soho Xia Stohr, PA-C 11/09/14 [place], MD 11/10/14 [phone]

## 2014-11-09 NOTE — ED Notes (Signed)
C/o upper back pain for months.  She had surgery on her back and the pain is related to that.  [place] doctor is in [place]

## 2014-11-09 NOTE — ED Notes (Signed)
Pt c/o lower back pain increasing over past month. Hx of prior surgery. No recently injury. C/o numbness/tingling in R arm after pain became worse. Pt ambulatory.

## 2014-11-25 ENCOUNTER — Ambulatory Visit: Payer: Medicare Other | Admitting: Family Medicine

## 2014-11-26 ENCOUNTER — Emergency Department (HOSPITAL_COMMUNITY)
Admission: EM | Admit: 2014-11-26 | Discharge: 2014-11-27 | Disposition: A | Discharge status: Home / Self Care | Payer: Medicare Other | Attending: Emergency Medicine | Admitting: Emergency Medicine

## 2014-11-26 ENCOUNTER — Encounter (HOSPITAL_COMMUNITY): Payer: Self-pay | Admitting: Emergency Medicine

## 2014-11-26 DIAGNOSIS — F329 Major depressive disorder, single episode, unspecified: Secondary | ICD-10-CM | POA: Insufficient documentation

## 2014-11-26 DIAGNOSIS — K625 Hemorrhage of anus and rectum: Secondary | ICD-10-CM | POA: Diagnosis present

## 2014-11-26 DIAGNOSIS — F419 Anxiety disorder, unspecified: Secondary | ICD-10-CM | POA: Diagnosis not present

## 2014-11-26 DIAGNOSIS — E119 Type 2 diabetes mellitus without complications: Secondary | ICD-10-CM | POA: Insufficient documentation

## 2014-11-26 DIAGNOSIS — Z79899 Other long term (current) drug therapy: Secondary | ICD-10-CM | POA: Diagnosis not present

## 2014-11-26 DIAGNOSIS — Z791 Long term (current) use of non-steroidal anti-inflammatories (NSAID): Secondary | ICD-10-CM | POA: Insufficient documentation

## 2014-11-26 LAB — COMPREHENSIVE METABOLIC PANEL
ALBUMIN: 3.8 g/dL (ref 3.5–5.0)
ALT: 23 U/L (ref 14–54)
ANION GAP: 5 (ref 5–15)
AST: 21 U/L (ref 15–41)
Alkaline Phosphatase: 89 U/L (ref 38–126)
BUN: 10 mg/dL (ref 6–20)
CHLORIDE: 102 mmol/L (ref 101–111)
CO2: 22 mmol/L (ref 22–32)
Calcium: 8.5 mg/dL — ABNORMAL LOW (ref 8.9–10.3)
Creatinine, Ser: 0.54 mg/dL (ref 0.44–1.00)
GFR calc Af Amer: >60 mL/min (ref >60)
GFR calc non Af Amer: >60 mL/min (ref >60)
Glucose, Bld: 103 mg/dL — ABNORMAL HIGH (ref 70–99)
Potassium: 3.9 mmol/L (ref 3.5–5.1)
Sodium: 129 mmol/L — ABNORMAL LOW (ref 135–145)
TOTAL PROTEIN: 6.6 g/dL (ref 6.5–8.1)
Total Bilirubin: 0.4 mg/dL (ref 0.3–1.2)

## 2014-11-26 LAB — URINALYSIS, ROUTINE W REFLEX MICROSCOPIC
Bilirubin Urine: NEGATIVE
Glucose, UA: NEGATIVE mg/dL
Ketones, ur: NEGATIVE mg/dL
Leukocytes, UA: NEGATIVE
Nitrite: NEGATIVE
PROTEIN: NEGATIVE mg/dL
Specific Gravity, Urine: 1.017 (ref 1.005–1.030)
UROBILINOGEN UA: 0.2 mg/dL (ref 0.0–1.0)
pH: 5.5 (ref 5.0–8.0)

## 2014-11-26 LAB — CBC WITH DIFFERENTIAL/PLATELET
BASOS ABS: 0.1 10*3/uL (ref 0.0–0.1)
Basophils Relative: 1 % (ref 0–1)
EOS ABS: 0.2 10*3/uL (ref 0.0–0.7)
Eosinophils Relative: 3 % (ref 0–5)
HCT: 38.8 % (ref 36.0–46.0)
Hemoglobin: 12.6 g/dL (ref 12.0–15.0)
Lymphocytes Relative: 43 % (ref 12–46)
Lymphs Abs: 2.5 10*3/uL (ref 0.7–4.0)
MCH: 26.4 pg (ref 26.0–34.0)
MCHC: 32.5 g/dL (ref 30.0–36.0)
MCV: 81.2 fL (ref 78.0–100.0)
Monocytes Absolute: 0.5 10*3/uL (ref 0.1–1.0)
Monocytes Relative: 8 % (ref 3–12)
NEUTROS PCT: 45 % (ref 43–77)
Neutro Abs: 2.7 10*3/uL (ref 1.7–7.7)
PLATELETS: 208 10*3/uL (ref 150–400)
RBC: 4.78 MIL/uL (ref 3.87–5.11)
RDW: 15.8 % — ABNORMAL HIGH (ref 11.5–15.5)
WBC: 5.8 10*3/uL (ref 4.0–10.5)

## 2014-11-26 LAB — URINE MICROSCOPIC-ADD ON

## 2014-11-26 NOTE — ED Notes (Signed)
Pt. reports rectal bleeding onset today , denies abdominal pain , pt. also stated right side body aches onset last week .

## 2014-11-26 NOTE — ED Provider Notes (Signed)
CSN: [id number]     Arrival date & time 11/26/14  2133 History  This chart was scribed for Veryl Speak, MD by [name], ED Scribe. This patient was seen in room B14C/B14C and the patient's care was started at 12:06 AM.     Chief Complaint  Patient presents with  . Rectal Bleeding   Patient is a 54 y.o. female presenting with hematochezia. The history is provided by the patient. No language interpreter was used.  Rectal Bleeding Duration:  1 day Chronicity:  New Context: rectal pain   Context: not constipation and not hemorrhoids   Pain details:    Severity:  Moderate Relieved by:  Nothing Worsened by:  Nothing tried Ineffective treatments:  None tried Associated symptoms: no fever    HPI Comments: Amy Glass is a 54 y.o. female who presents to the Emergency Department complaining of new rectal bleeding onset 1 day ago. Pt states she has associated rectal pain. Pt reports change in her bowel movements and increased flatus. Pt doesn't report any alleviating factors or treatments tried. Pt also state she is having some right sided hip pain that's worse with moving. Pt denies constipation, fever, dysuria. Denies Hx hemorrhoids. Pt has a Hx of colonoscopy 3 years prior. Pt reports Hx of c-section. Pt states she [facility].   Past Medical History  Diagnosis Date  . Anxiety   . Depression   . Diabetes mellitus without complication    Past Surgical History  Procedure Laterality Date  . Shoulder surgery    . Cesarean section    . Back surgery  2002   Family History  Problem Relation Age of Onset  . Diabetes Mother   . Heart disease Mother   . Hyperlipidemia Mother   . Hypertension Mother    History  Substance Use Topics  . Smoking status: Never Smoker   . Smokeless tobacco: Not on file  . Alcohol Use: No   OB History    No data available      Review of Systems  Constitutional: Negative for fever.  Gastrointestinal:  Positive for hematochezia, anal bleeding and rectal pain. Negative for constipation.  All other systems reviewed and are negative.   Allergies  Nitroglycerin  Home Medications   Prior to Admission medications   Medication Sig Start Date End Date Taking? Authorizing Provider  cephALEXin (KEFLEX) 500 MG capsule Take 1 capsule (500 mg total) by mouth 2 (two) times daily. Patient not taking: Reported on 11/09/2014 01/19/14   Darreld Mclean, MD  Cyanocobalamin (VITAMIN B-12 PO) Take 1 tablet by mouth daily.    Historical Provider, MD  cyclobenzaprine (FLEXERIL) 10 MG tablet Take 1 tablet (10 mg total) by mouth 2 (two) times daily as needed for muscle spasms. 11/09/14   Hannah Muthersbaugh, PA-C  DULoxetine (CYMBALTA) 60 MG capsule Take 60 mg by mouth daily. 09/24/14   Historical Provider, MD  FOLIC ACID PO Take 1 tablet by mouth at bedtime.    Historical Provider, MD  ibuprofen (ADVIL,MOTRIN) 200 MG tablet Take 400 mg by mouth every 6 (six) hours as needed for mild pain or moderate pain.    Historical Provider, MD  lisinopril (PRINIVIL,ZESTRIL) 10 MG tablet Take 10 mg by mouth daily.    Historical Provider, MD  meloxicam (MOBIC) 15 MG tablet Take 1 tablet (15 mg total) by mouth daily. 11/09/14   Hannah Muthersbaugh, PA-C  OVER THE COUNTER MEDICATION Apply 1 application topically daily as  needed (spots on face).    Historical Provider, MD  Oxymetazoline HCl (NASAL SPRAY NA) Place 1-2 sprays into both nostrils daily as needed (allergies).    Historical Provider, MD  traZODone (DESYREL) 50 MG tablet Take 50 mg by mouth at bedtime. 10/08/14   Historical Provider, MD   BP 145/92 mmHg  Pulse 82  Temp(Src) 98.2 F (36.8 C) (Oral)  Resp 16  SpO2 99%   Physical Exam  Constitutional: She is oriented to person, place, and time. She appears well-developed and well-nourished. No distress.  HENT:  Head: Normocephalic and atraumatic.  Eyes: Conjunctivae and EOM are normal.  Neck: Neck supple. No tracheal  deviation present.  Cardiovascular: Normal rate, regular rhythm and normal heart sounds.   No murmur heard. Pulmonary/Chest: Effort normal and breath sounds normal. No respiratory distress.  Abdominal: Soft. There is no tenderness.  Genitourinary: Rectum normal.  Chaperone present.   Musculoskeletal: Normal range of motion.  Neurological: She is alert and oriented to person, place, and time.  Skin: Skin is warm and dry.  Psychiatric: She has a normal mood and affect. Her behavior is normal.  Nursing note and vitals reviewed.   ED Course  Procedures (including critical care time) DIAGNOSTIC STUDIES: Oxygen Saturation is 99% on RA, normal by my interpretation.    COORDINATION OF CARE: 12:29 AM-Discussed treatment plan with pt at bedside and pt agreed to plan.    Labs Review Labs Reviewed  CBC WITH DIFFERENTIAL/PLATELET - Abnormal; Notable for the following:    RDW 15.8 (*)    All other components within normal limits  COMPREHENSIVE METABOLIC PANEL - Abnormal; Notable for the following:    Sodium 129 (*)    Glucose, Bld 103 (*)    Calcium 8.5 (*)    All other components within normal limits  URINALYSIS, ROUTINE W REFLEX MICROSCOPIC - Abnormal; Notable for the following:    Hgb urine dipstick TRACE (*)    All other components within normal limits  URINE MICROSCOPIC-ADD ON    Imaging Review No results found.   EKG Interpretation None      MDM   Final diagnoses:  None      Patient is a 54 year old female who presents with complaints of rectal bleeding starting earlier today. She denies any abdominal pain, fevers, or chills. She has no prior history of hemorrhoids that she is aware of. Her physical examination is unremarkable. I see no hemorrhoids, fissures, or masses on rectal exam. She does have heme positive stool, however she is hemodynamically stable and hemoglobin is 12.6. I see no indication for admission and believe outpatient referral for colonoscopy is  appropriate. She has been provided with the contact information for the gastroenterology clinic and she can call on Monday to arrange this appointment.   I personally performed the services described in this documentation, which was scribed in my presence. The recorded information has been reviewed and is accurate.      Veryl Speak, MD 11/27/14 [phone]

## 2014-11-27 LAB — POC OCCULT BLOOD, ED: FECAL OCCULT BLD: POSITIVE — AB

## 2014-11-27 NOTE — Discharge Instructions (Signed)
Call [facility] to arrange a follow-up appointment for this week. Contact information for the office has been provided in this discharge summary.  Return to the emergency department for severe abdominal pain, worsening bleeding, or other new and concerning symptoms.   Rectal Bleeding Rectal bleeding is when blood passes out of the anus. It is usually a sign that something is wrong. It may not be serious, but it should always be evaluated. Rectal bleeding may present as bright red blood or extremely dark stools. The color may range from dark red or maroon to black (like tar). It is important that the cause of rectal bleeding be identified so treatment can be started and the problem corrected. CAUSES   Hemorrhoids. These are enlarged (dilated) blood vessels or veins in the anal or rectal area.  Fistulas. Theseare abnormal, burrowing channels that usually run from inside the rectum to the skin around the anus. They can bleed.  Anal fissures. This is a tear in the tissue of the anus. Bleeding occurs with bowel movements.  Diverticulosis. This is a condition in which pockets or sacs project from the bowel wall. Occasionally, the sacs can bleed.  Diverticulitis. Thisis an infection involving diverticulosis of the colon.  Proctitis and colitis. These are conditions in which the rectum, colon, or both, can become inflamed and pitted (ulcerated).  Polyps and cancer. Polyps are non-cancerous (benign) growths in the colon that may bleed. Certain types of polyps turn into cancer.  Protrusion of the rectum. Part of the rectum can project from the anus and bleed.  Certain medicines.  Intestinal infections.  Blood vessel abnormalities. HOME CARE INSTRUCTIONS  Eat a high-fiber diet to keep your stool soft.  Limit activity.  Drink enough fluids to keep your urine clear or pale yellow.  Warm baths may be useful to soothe rectal pain.  Follow up with your caregiver as  directed. SEEK IMMEDIATE MEDICAL CARE IF:  You develop increased bleeding.  You have black or dark red stools.  You vomit blood or material that looks like coffee grounds.  You have abdominal pain or tenderness.  You have a fever.  You feel weak, nauseous, or you faint.  You have severe rectal pain or you are unable to have a bowel movement. MAKE SURE YOU:  Understand these instructions.  Will watch your condition.  Will get help right away if you are not doing well or get worse. Document Released: 12/28/2001 Document Revised: 09/30/2011 Document Reviewed: 12/23/2010 [facility] Patient Information 2015 [place], [place]. This information is not intended to replace advice given to you by your health care provider. Make sure you discuss any questions you have with your health care provider.

## 2014-11-27 NOTE — ED Notes (Signed)
Pt A&OX4, ambulatory at d/c with steady gait, NAD 

## 2014-11-28 DIAGNOSIS — M25512 Pain in left shoulder: Secondary | ICD-10-CM | POA: Diagnosis not present

## 2014-11-28 DIAGNOSIS — M542 Cervicalgia: Secondary | ICD-10-CM | POA: Diagnosis not present

## 2014-11-28 DIAGNOSIS — G603 Idiopathic progressive neuropathy: Secondary | ICD-10-CM | POA: Diagnosis not present

## 2014-11-28 DIAGNOSIS — M546 Pain in thoracic spine: Secondary | ICD-10-CM | POA: Diagnosis not present

## 2014-11-28 DIAGNOSIS — M79651 Pain in right thigh: Secondary | ICD-10-CM | POA: Diagnosis not present

## 2014-11-28 DIAGNOSIS — M25511 Pain in right shoulder: Secondary | ICD-10-CM | POA: Diagnosis not present

## 2014-11-28 DIAGNOSIS — M25572 Pain in left ankle and joints of left foot: Secondary | ICD-10-CM | POA: Diagnosis not present

## 2014-11-28 DIAGNOSIS — G8929 Other chronic pain: Secondary | ICD-10-CM | POA: Diagnosis not present

## 2014-11-28 DIAGNOSIS — M25551 Pain in right hip: Secondary | ICD-10-CM | POA: Diagnosis not present

## 2014-11-28 DIAGNOSIS — G541 Lumbosacral plexus disorders: Secondary | ICD-10-CM | POA: Diagnosis not present

## 2014-12-02 ENCOUNTER — Ambulatory Visit: Payer: Medicare Other | Attending: Family Medicine | Admitting: Family Medicine

## 2014-12-02 ENCOUNTER — Encounter: Payer: Self-pay | Admitting: Family Medicine

## 2014-12-02 VITALS — BP 133/88 | HR 88 | Temp 98.4°F | Resp 16 | Ht 59.0 in | Wt 142.0 lb

## 2014-12-02 DIAGNOSIS — Z114 Encounter for screening for human immunodeficiency virus [HIV]: Secondary | ICD-10-CM | POA: Diagnosis not present

## 2014-12-02 DIAGNOSIS — M25551 Pain in right hip: Secondary | ICD-10-CM

## 2014-12-02 DIAGNOSIS — R200 Anesthesia of skin: Secondary | ICD-10-CM

## 2014-12-02 DIAGNOSIS — E559 Vitamin D deficiency, unspecified: Secondary | ICD-10-CM | POA: Diagnosis not present

## 2014-12-02 DIAGNOSIS — K921 Melena: Secondary | ICD-10-CM | POA: Insufficient documentation

## 2014-12-02 DIAGNOSIS — E1110 Type 2 diabetes mellitus with ketoacidosis without coma: Secondary | ICD-10-CM

## 2014-12-02 DIAGNOSIS — R209 Unspecified disturbances of skin sensation: Secondary | ICD-10-CM | POA: Diagnosis not present

## 2014-12-02 DIAGNOSIS — Z1159 Encounter for screening for other viral diseases: Secondary | ICD-10-CM | POA: Diagnosis not present

## 2014-12-02 DIAGNOSIS — E781 Pure hyperglyceridemia: Secondary | ICD-10-CM

## 2014-12-02 DIAGNOSIS — E1310 Other specified diabetes mellitus with ketoacidosis without coma: Secondary | ICD-10-CM | POA: Insufficient documentation

## 2014-12-02 DIAGNOSIS — E119 Type 2 diabetes mellitus without complications: Secondary | ICD-10-CM | POA: Diagnosis not present

## 2014-12-02 DIAGNOSIS — R202 Paresthesia of skin: Secondary | ICD-10-CM | POA: Insufficient documentation

## 2014-12-02 LAB — LIPID PANEL
CHOL/HDL RATIO: 11.8 ratio
CHOLESTEROL: 271 mg/dL — AB (ref 0–200)
HDL: 23 mg/dL — AB (ref >=46)
Triglycerides: 1302 mg/dL — ABNORMAL HIGH (ref <150)

## 2014-12-02 LAB — POCT GLYCOSYLATED HEMOGLOBIN (HGB A1C): HEMOGLOBIN A1C: 6.4

## 2014-12-02 LAB — VITAMIN B12: Vitamin B-12: 1405 pg/mL — ABNORMAL HIGH (ref 211–911)

## 2014-12-02 LAB — TSH: TSH: 1.625 u[IU]/mL (ref 0.350–4.500)

## 2014-12-02 LAB — GLUCOSE, POCT (MANUAL RESULT ENTRY): POC Glucose: 103 mg/dl — AB (ref 70–99)

## 2014-12-02 MED ORDER — SITAGLIPTIN PHOSPHATE 100 MG PO TABS: 100.0000 mg | ORAL_TABLET | Freq: Every day | ORAL | Status: DC

## 2014-12-02 NOTE — Patient Instructions (Addendum)
Amy Glass,  Thank you for coming in today. It was a pleasure meeting you. I look forward to being your primary doctor.  1. R lateral hip pain that is chronic is worsening: ? Trochanteric bursitis vs referred pain from low back  Continue ultracet twice daily Tylenol 1000 mg twice daily  No more than 3000 mg of tylenol in 24 hrs   Sports medicine referral   2. Blood in stool: Will get previous records of colonoscopy GI referral   3. Diabetes: Well controlled Ordered juniva 100 mg once daily since janumet too expensive and metformin upsets your stomach  4. Numbness in R hand: B12  Vit D TSH   Will need records of: orthopedic surgeries (low back and shoulders) and colonoscopies  F/u in 6 weeks to discuss jaw pain   Dr. Adrian Blackwater

## 2014-12-02 NOTE — Assessment & Plan Note (Addendum)
.   Diabetes: Well controlled Ordered juniva 100 mg once daily since janumet too expensive and metformin upsets your stomach

## 2014-12-02 NOTE — Progress Notes (Signed)
Establish Care Complaining of hip pain, unable to sit for a long time Requesting colonoscopy referral due to blood on stool  Problem with TMJ

## 2014-12-02 NOTE — Assessment & Plan Note (Signed)
.   Blood in stool: with heme positive stool, ? Internal hemorrhoid.  Bleeding has resolved  Will get previous records of colonoscopy GI referral

## 2014-12-02 NOTE — Assessment & Plan Note (Signed)
Numbness in R hand: B12  Vit D TSH

## 2014-12-02 NOTE — Assessment & Plan Note (Signed)
R lateral hip pain that is chronic is worsening: ? Trochanteric bursitis vs referred pain from low back  Continue ultracet twice daily Tylenol 1000 mg twice daily  No more than 3000 mg of tylenol in 24 hrs   Sports medicine referral
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
## 2014-12-02 NOTE — Assessment & Plan Note (Signed)
Screening HIV ordered  

## 2014-12-03 LAB — MICROALBUMIN, URINE: MICROALB UR: 9 mg/dL — AB (ref <2.0)

## 2014-12-03 LAB — HIV ANTIBODY (ROUTINE TESTING W REFLEX): HIV 1&2 Ab, 4th Generation: NONREACTIVE

## 2014-12-03 LAB — VITAMIN D 25 HYDROXY (VIT D DEFICIENCY, FRACTURES): VIT D 25 HYDROXY: 20 ng/mL — AB (ref 30–100)

## 2014-12-05 DIAGNOSIS — E781 Pure hyperglyceridemia: Secondary | ICD-10-CM | POA: Insufficient documentation

## 2014-12-05 DIAGNOSIS — E559 Vitamin D deficiency, unspecified: Secondary | ICD-10-CM | POA: Insufficient documentation

## 2014-12-05 MED ORDER — VITAMIN D3 50 MCG (2000 UT) PO TABS: 2000.0000 [IU] | ORAL_TABLET | Freq: Every day | ORAL | Status: DC

## 2014-12-05 NOTE — Addendum Note (Signed)
Addended by: Boykin Nearing on: 12/05/2014 03:06 PM   Modules accepted: Orders

## 2014-12-06 DIAGNOSIS — F319 Bipolar disorder, unspecified: Secondary | ICD-10-CM | POA: Diagnosis not present

## 2014-12-12 ENCOUNTER — Telehealth: Payer: Self-pay | Admitting: Family Medicine

## 2014-12-12 ENCOUNTER — Telehealth: Payer: Self-pay | Admitting: *Deleted

## 2014-12-12 DIAGNOSIS — M25551 Pain in right hip: Secondary | ICD-10-CM

## 2014-12-12 NOTE — Telephone Encounter (Signed)
Pt called requesting to speak to nurse regarding her colonoscopy, patient states she has been having a lot of pain and is waiting on records from [place] but they are taking longer. Please f/u with patient

## 2014-12-12 NOTE — Telephone Encounter (Signed)
Patient called stating that she is currently still waiting for her referral but has been having a lot of pain but does not know what to do in the mean time. Please f/u with pt.

## 2014-12-12 NOTE — Telephone Encounter (Signed)
Patient was called regarding her colonoscopy and rectal pain.  Advised patient that if pain or bleeding was severe to go to the emergency room.  Patient understood.

## 2014-12-13 MED ORDER — TRAMADOL HCL 50 MG PO TABS: 50.0000 mg | ORAL_TABLET | Freq: Three times a day (TID) | ORAL | Status: DC | PRN

## 2014-12-13 NOTE — Telephone Encounter (Signed)
Left voice message  Rx at front office

## 2014-12-13 NOTE — Telephone Encounter (Signed)
Please call patient.  I have refilled tramadol it is ready for pick up.

## 2014-12-14 NOTE — Telephone Encounter (Signed)
Amy Glass Gi put a note in the referral  5/23 pt refuses to sch office visit with provider. Wants to wait until records are received and schd direct COLON.

## 2014-12-16 ENCOUNTER — Telehealth: Payer: Self-pay | Admitting: *Deleted

## 2014-12-16 NOTE — Telephone Encounter (Signed)
-----   Message from Boykin Nearing, MD sent at 12/05/2014  3:04 PM EDT ----- Normal labs except vit D insufficiency and  high cholesterol with very high TGs, low HDL,   Will treat with vit D 2 K U daily  Will need to repeat lipids fasting before deciding on a treatment plan

## 2014-12-16 NOTE — Telephone Encounter (Signed)
Pt aware of results  Pt has appointment with Dr Adrian Blackwater on 01/16/2015

## 2014-12-28 ENCOUNTER — Ambulatory Visit (INDEPENDENT_AMBULATORY_CARE_PROVIDER_SITE_OTHER): Payer: Medicare Other | Admitting: Internal Medicine

## 2014-12-28 ENCOUNTER — Encounter: Payer: Self-pay | Admitting: Internal Medicine

## 2014-12-28 VITALS — BP 124/88 | HR 90 | Ht 58.5 in | Wt 146.4 lb

## 2014-12-28 DIAGNOSIS — Z86010 Personal history of colonic polyps: Secondary | ICD-10-CM

## 2014-12-28 DIAGNOSIS — R1310 Dysphagia, unspecified: Secondary | ICD-10-CM

## 2014-12-28 DIAGNOSIS — M25561 Pain in right knee: Secondary | ICD-10-CM | POA: Diagnosis not present

## 2014-12-28 DIAGNOSIS — M545 Low back pain: Secondary | ICD-10-CM | POA: Diagnosis not present

## 2014-12-28 DIAGNOSIS — R1314 Dysphagia, pharyngoesophageal phase: Secondary | ICD-10-CM

## 2014-12-28 DIAGNOSIS — G8929 Other chronic pain: Secondary | ICD-10-CM | POA: Diagnosis not present

## 2014-12-28 DIAGNOSIS — M79651 Pain in right thigh: Secondary | ICD-10-CM | POA: Diagnosis not present

## 2014-12-28 DIAGNOSIS — M25572 Pain in left ankle and joints of left foot: Secondary | ICD-10-CM | POA: Diagnosis not present

## 2014-12-28 DIAGNOSIS — M25512 Pain in left shoulder: Secondary | ICD-10-CM | POA: Diagnosis not present

## 2014-12-28 DIAGNOSIS — R195 Other fecal abnormalities: Secondary | ICD-10-CM | POA: Diagnosis not present

## 2014-12-28 DIAGNOSIS — K625 Hemorrhage of anus and rectum: Secondary | ICD-10-CM

## 2014-12-28 DIAGNOSIS — M546 Pain in thoracic spine: Secondary | ICD-10-CM | POA: Diagnosis not present

## 2014-12-28 MED ORDER — NA SULFATE-K SULFATE-MG SULF 17.5-3.13-1.6 GM/177ML PO SOLN: 1.0000 | Freq: Once | ORAL | Status: DC

## 2014-12-28 NOTE — Progress Notes (Addendum)
Patient ID: Amy Glass, female   DOB: 1960-12-24, 54 y.o.   MRN: [id number] HPI: Amy Glass is a 54 yo female with PMH of colon polyps, esophageal dysphagia, HTN, DM, anxiety and depression who is seen in consultation at the request of Dr. Adrian Blackwater to evaluate recent rectal bleeding. She is also having issue with solid food dysphagia. Patient had several days of bright red blood per rectum occurring about 6 weeks ago. She's also had change in her bowel movements with feeling of incomplete evacuation and increased abdominal bloating and gas. She does intermittently report rectal pain which can radiate down her right leg. Stool was found to be heme positive by primary care. She denies diarrhea. She reports long-standing issues with swallowing dating back to 30. She gets the sensation of food sticking and needing to force food down with water. She also has odynophagia. There is nausea without vomiting. She denies weight loss. Reportedly workup previously involved upper endoscopy, barium swallow. Unclear if she ever have manometry. She denies a family history of colon cancer. Her father did have stomach cancer. No IBD. She moved from [place] recently to be close to her mother...  She reports colonoscopy approximately 2 years ago and recalls colon polyps needing to be removed.  Past Medical History  Diagnosis Date  . Anxiety     as child   . Depression     as child  . Diabetes mellitus without complication Dx 8502  . Hypertension Dx 2003    Past Surgical History  Procedure Laterality Date  . Shoulder surgery  08/2013     b/l shoulder   . Back surgery  2002    lumbar  . Cesarean section  05/20/2003     Outpatient Prescriptions Prior to Visit  Medication Sig Dispense Refill  . Cholecalciferol (VITAMIN D3) 2000 UNITS TABS Take 2,000 Units by mouth daily. 30 tablet 11  . DULoxetine (CYMBALTA) 60 MG capsule Take 60 mg by mouth daily.  2  . FOLIC ACID PO Take 1 tablet by mouth at bedtime.     . hydrOXYzine (ATARAX/VISTARIL) 25 MG tablet Take 25 mg by mouth 3 (three) times daily as needed.    . lamoTRIgine (LAMICTAL) 150 MG tablet Take 150 mg by mouth daily.    Marland Kitchen lisinopril (PRINIVIL,ZESTRIL) 10 MG tablet Take 10 mg by mouth daily.    . Oxymetazoline HCl (NASAL SPRAY NA) Place 1-2 sprays into both nostrils daily as needed (allergies).    . traMADol (ULTRAM) 50 MG tablet Take 1 tablet (50 mg total) by mouth every 8 (eight) hours as needed. 60 tablet 0  . traZODone (DESYREL) 50 MG tablet Take 50 mg by mouth at bedtime.  2  . sitaGLIPtin (JANUVIA) 100 MG tablet Take 1 tablet (100 mg total) by mouth daily. 30 tablet 11   No facility-administered medications prior to visit.    Allergies  Allergen Reactions  . Nitroglycerin Other (See Comments)    Migraines, (only tried nitro patch. Has never tried the pills)    Family History  Problem Relation Age of Onset  . Diabetes Mother   . Heart disease Mother   . Hyperlipidemia Mother   . Hypertension Mother   . Renal cancer Maternal Aunt   . Stomach cancer Maternal Uncle     History  Substance Use Topics  . Smoking status: Never Smoker   . Smokeless tobacco: Never Used  . Alcohol Use: No    ROS: As per history of present illness,  otherwise negative  BP 124/88 mmHg  Pulse 90  Ht 4' 10.5" (1.486 m)  Wt 146 lb 6 oz (66.395 kg)  BMI 30.07 kg/m2 Constitutional: Well-developed and well-nourished. No distress. HEENT: Normocephalic and atraumatic. Oropharynx is clear and moist. No oropharyngeal exudate. Conjunctivae are normal.  No scleral icterus. Neck: Neck supple. Trachea midline. Cardiovascular: Normal rate, regular rhythm and intact distal pulses. No M/R/G Pulmonary/chest: Effort normal and breath sounds normal. No wheezing, rales or rhonchi. Abdominal: Soft, nontender, nondistended. Bowel sounds active throughout.  Extremities: no clubbing, cyanosis, or edema Lymphadenopathy: No cervical adenopathy noted. Neurological:  Alert and oriented to person place and time. Skin: Skin is warm and dry. No rashes noted. Psychiatric: Normal mood and affect. Behavior is normal.  RELEVANT LABS AND IMAGING: CBC    Component Value Date/Time   WBC 5.8 11/26/2014 2150   RBC 4.78 11/26/2014 2150   HGB 12.6 11/26/2014 2150   HCT 38.8 11/26/2014 2150   PLT 208 11/26/2014 2150   MCV 81.2 11/26/2014 2150   MCH 26.4 11/26/2014 2150   MCHC 32.5 11/26/2014 2150   RDW 15.8* 11/26/2014 2150   LYMPHSABS 2.5 11/26/2014 2150   MONOABS 0.5 11/26/2014 2150   EOSABS 0.2 11/26/2014 2150   BASOSABS 0.1 11/26/2014 2150    CMP     Component Value Date/Time   NA 129* 11/26/2014 2150   K 3.9 11/26/2014 2150   CL 102 11/26/2014 2150   CO2 22 11/26/2014 2150   GLUCOSE 103* 11/26/2014 2150   BUN 10 11/26/2014 2150   CREATININE 0.54 11/26/2014 2150   CALCIUM 8.5* 11/26/2014 2150   PROT 6.6 11/26/2014 2150   ALBUMIN 3.8 11/26/2014 2150   AST 21 11/26/2014 2150   ALT 23 11/26/2014 2150   ALKPHOS 89 11/26/2014 2150   BILITOT 0.4 11/26/2014 2150   GFRNONAA >60 11/26/2014 2150   GFRAA >60 11/26/2014 2150    ASSESSMENT/PLAN:  54 yo female with PMH of colon polyps, esophageal dysphagia, HTN, DM, anxiety and depression who is seen in consultation at the request of Dr. Adrian Blackwater to evaluate recent rectal bleeding.  1. Rectal bleeding/history of colon polyps/change in bowel habits -- colonoscopy recommended for these reasons. We discussed the risks, benefits and alternatives and she is agreeable to proceed. Blood counts recently normal which is encouraging.  2. Solid food dysphagia with odynophagia -- query esophageal dysmotility. Upper endoscopy recommended with possible dilation. She denies heartburn or regurgitation. Reportedly previously used PPI without benefit. Await upper endoscopy results and review of records. If negative would strongly consider esophageal manometry    AV:[id number] [place], [place]  [id number]   Addendum for records received: EGD and colonoscopy 12/02/2012 -- modest erythema of the distal esophagus with overlying small hiatal hernia of about 3 cm in length. Diffuse gastritis. Duodenal folds appear thickened with erythema. Pathology results = unremarkable small bowel with intact villous architecture. Unremarkable squamous mucosa of the esophagus. Colonoscopy diverticulosis, 2 distal transverse colon polyps removed. Internal hemorrhoids. Pathology = adenomatous polyp

## 2014-12-28 NOTE — Patient Instructions (Signed)
You have been scheduled for an endoscopy and colonoscopy. Please follow the written instructions given to you at your visit today. Please pick up your prep supplies at the pharmacy within the next 1-3 days. If you use inhalers (even only as needed), please bring them with you on the day of your procedure.  

## 2015-01-02 ENCOUNTER — Encounter: Payer: Self-pay | Admitting: Family Medicine

## 2015-01-02 ENCOUNTER — Ambulatory Visit (INDEPENDENT_AMBULATORY_CARE_PROVIDER_SITE_OTHER): Payer: Medicare Other | Admitting: Family Medicine

## 2015-01-02 VITALS — BP 119/58 | Ht 59.0 in | Wt 145.0 lb

## 2015-01-02 DIAGNOSIS — M25551 Pain in right hip: Secondary | ICD-10-CM

## 2015-01-02 NOTE — Patient Instructions (Signed)
Please get the X RAY of your hips and see me back in clinic. The orders for your hip x ray are in already.
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
## 2015-01-03 NOTE — Assessment & Plan Note (Signed)
I suspect she has some component of gluteus medius weakness and possibly some greater trochanteric bursitis. It was a difficult exam to perform because she yelled some pain if I just gently touch her lateral hip. I think we'll start with x-rays and then I'll see her back.

## 2015-01-04 ENCOUNTER — Ambulatory Visit (HOSPITAL_COMMUNITY)
Admission: RE | Admit: 2015-01-04 | Discharge: 2015-01-04 | Disposition: A | Discharge status: Home / Self Care | Payer: Medicare Other | Source: Ambulatory Visit | Attending: Family Medicine | Admitting: Family Medicine

## 2015-01-04 DIAGNOSIS — M25552 Pain in left hip: Secondary | ICD-10-CM | POA: Diagnosis not present

## 2015-01-04 DIAGNOSIS — M160 Bilateral primary osteoarthritis of hip: Secondary | ICD-10-CM | POA: Diagnosis not present

## 2015-01-04 DIAGNOSIS — M25551 Pain in right hip: Secondary | ICD-10-CM | POA: Diagnosis not present

## 2015-01-10 ENCOUNTER — Encounter: Payer: Self-pay | Admitting: Sports Medicine

## 2015-01-10 ENCOUNTER — Ambulatory Visit (INDEPENDENT_AMBULATORY_CARE_PROVIDER_SITE_OTHER): Payer: Medicare Other | Admitting: Sports Medicine

## 2015-01-10 ENCOUNTER — Ambulatory Visit (HOSPITAL_COMMUNITY)
Admission: RE | Admit: 2015-01-10 | Discharge: 2015-01-10 | Disposition: A | Discharge status: Home / Self Care | Payer: Medicare Other | Source: Ambulatory Visit | Attending: Sports Medicine | Admitting: Sports Medicine

## 2015-01-10 VITALS — BP 147/97 | HR 89 | Ht 59.0 in | Wt 145.0 lb

## 2015-01-10 DIAGNOSIS — M5136 Other intervertebral disc degeneration, lumbar region: Secondary | ICD-10-CM | POA: Diagnosis not present

## 2015-01-10 DIAGNOSIS — M47896 Other spondylosis, lumbar region: Secondary | ICD-10-CM | POA: Diagnosis not present

## 2015-01-10 DIAGNOSIS — M51369 Other intervertebral disc degeneration, lumbar region without mention of lumbar back pain or lower extremity pain: Secondary | ICD-10-CM | POA: Insufficient documentation

## 2015-01-10 DIAGNOSIS — M25551 Pain in right hip: Secondary | ICD-10-CM

## 2015-01-10 DIAGNOSIS — M545 Low back pain: Secondary | ICD-10-CM | POA: Insufficient documentation

## 2015-01-10 DIAGNOSIS — M47816 Spondylosis without myelopathy or radiculopathy, lumbar region: Secondary | ICD-10-CM | POA: Diagnosis not present

## 2015-01-10 MED ORDER — METHYLPREDNISOLONE ACETATE 40 MG/ML IJ SUSP: 40.0000 mg | Freq: Once | INTRAMUSCULAR | Status: DC

## 2015-01-10 NOTE — Assessment & Plan Note (Signed)
Most likely related to greater trochanteric bursitis with associated gluteus medius tendinopathy. The underlying etiology to this condition may be related to neurologic related denervation of the gluteus medius muscle relating from previous lumbar fusion surgery. 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  -We will obtain lumbar spine x-rays to evaluate her hardware and for adjacent segment disease. -She tolerated the greater trochanteric injection well today with relief of symptoms. -I would like her to start some formal physical therapy to work on gluteus medius strengthening -She'll follow-up in one month for repeat evaluation. We'll follow-up her x-rays sooner and I will give her call to relay what they showed. She may call sooner in the interim if needed.

## 2015-01-10 NOTE — Progress Notes (Signed)
   Subjective:    Patient ID: Amy Glass, female    DOB: 07-Dec-1960, 54 y.o.   MRN: [id number]  HPI Mrs. Swinford is a 54 year old female presents for follow-up of right hip pain. She was last seen by Dr. Nori Riis on 01/03/15. She has obtained x-rays of the bilateral hips since then. These showed normal-appearing hip joints without any sign of avascular necrosis or degenerative change. The pedicle screws at L4-L5 appear to be in normal AP alignment without sign of loosening, though lateral views were not obtained. I asked her about the prior L4-5 fusion surgery and she says this was performed in 2002 in [place], [place].   As far as the right hip pain is concerned, this has persisted and is chronic in nature. Location of pain is the right posterolateral hip and gluteal region. Symptoms are aggravated with palpation or with laying on the right side. She previously had greater trochanteric injections at a pain management clinic in [place] a few years ago. This had relieved her symptoms. She denies any deep groin pain. Occasionally the pain will radiate down the lateral thigh and to the anterior knee. She denies any radicular symptoms beyond the knee. Pain is intermittent and sharp quality. She has not tried any other interventions recently. She notes associated right hip weakness. She denies any numbness, tingling, loss of bladder or bowel, or saddle anesthesia.  Past medical history, social history, medications, and allergies were reviewed and are up to date in the chart.  Review of Systems Past medical history, social history, medications, and allergies were reviewed and are up to date in the chart.     Objective:   Physical Exam BP 147/97 mmHg  Pulse 89  Ht 4\' 11"  (1.499 m)  Wt 145 lb (65.772 kg)  BMI 29.27 kg/m2 GEN: The patient is well-developed well-nourished female and in no acute distress.  She is awake alert and oriented x3. SKIN: warm and well-perfused, no rash  EXTR: No lower  extremity edema or calf tenderness Neuro: Strength 5/5 globally. Sensation intact throughout. DTRs 1/4 bilateral patellar reflexes. Down-going babinski. No focal deficits. Vasc: +2 bilateral distal pulses. No edema.  MSK: Examination of the lumbar spine reveals mild lumbosacral tenderness to palpation at the L5-S1 region. Point of maximal tenderness is posterior to the right greater trochanter. Positive Faber test. Significant weakness of the right hip abductors. The right hip greater trochanter seem slightly more prominent, likely related to gluteus medius atrophy. No overlying erythema or induration. She has good internal and external range of motion of the bilateral hips without pain or catching. Negative pelvic compression test. No leg length discrepancy.  Right Greater Trochanteric Injection Procedure: The procedure was explained to the patient in detail, all questions were answered.  After obtaining informed verbal consent, the right hip was sterilely prepped with alcohol. Using strict sterile technique a 22 gauge, spinalneedle was inserted into the point of maximal tenderness over the greater trochanter.  It was directed down to bone and then slightly off.  A mixture of 2 cc of methylprednisolone and 6 cc 1% lidocaine without epinephrine was injected without resistance. The needle was removed, and a sterile bandage was applied. The patient tolerated the procedure well and was observed for 5-10 minutes.  Post injection instructions, including signs and symptoms of complications were discussed.     Assessment & Plan:  Please see problem based assessment and plan in the problem list.

## 2015-01-10 NOTE — Assessment & Plan Note (Signed)
-  Concern for adjacent segment disease with compression of the L4 nerve on the right creating her right lateral hip and anterior thigh pain. -Plan for x-rays of the lumbar spine, I will call her regarding the results -Plan for follow-up in 4 weeks or sooner if needed.

## 2015-01-12 ENCOUNTER — Telehealth: Payer: Self-pay | Admitting: Sports Medicine

## 2015-01-12 NOTE — Telephone Encounter (Signed)
Called patient. Relayed x-ray results. LS hardware in good position and alignment without sign of loosening. Neural foramina and disc heights at adjacent segments appear to be well preserved. Patient with some ongoing lateral leg pain, worse with crossing her leg over. Still think gluteus medius tendinopathy with greater trochanteric bursa inflammation is most likely. Discussed attending physical therapy for this. Plan that if pain radiating down the leg persists, then advanced imaging may be a consideration. She agreed with our plan.

## 2015-01-16 ENCOUNTER — Ambulatory Visit: Payer: Medicare Other | Attending: Family Medicine | Admitting: Family Medicine

## 2015-01-16 ENCOUNTER — Encounter: Payer: Self-pay | Admitting: Family Medicine

## 2015-01-16 VITALS — BP 136/82 | HR 96 | Temp 98.6°F | Resp 16 | Ht 59.0 in | Wt 145.0 lb

## 2015-01-16 DIAGNOSIS — E119 Type 2 diabetes mellitus without complications: Secondary | ICD-10-CM | POA: Diagnosis not present

## 2015-01-16 DIAGNOSIS — M2669 Other specified disorders of temporomandibular joint: Secondary | ICD-10-CM | POA: Diagnosis not present

## 2015-01-16 DIAGNOSIS — K088 Other specified disorders of teeth and supporting structures: Secondary | ICD-10-CM | POA: Diagnosis not present

## 2015-01-16 DIAGNOSIS — M26629 Arthralgia of temporomandibular joint, unspecified side: Secondary | ICD-10-CM | POA: Insufficient documentation

## 2015-01-16 DIAGNOSIS — K089 Disorder of teeth and supporting structures, unspecified: Secondary | ICD-10-CM | POA: Insufficient documentation

## 2015-01-16 LAB — GLUCOSE, POCT (MANUAL RESULT ENTRY): POC Glucose: 150 mg/dl — AB (ref 70–99)

## 2015-01-16 MED ORDER — SITAGLIPTIN PHOS-METFORMIN HCL 50-500 MG PO TABS: 1.0000 | ORAL_TABLET | Freq: Two times a day (BID) | ORAL | Status: DC

## 2015-01-16 MED ORDER — METFORMIN HCL 500 MG PO TABS: 500.0000 mg | ORAL_TABLET | Freq: Two times a day (BID) | ORAL | Status: DC

## 2015-01-16 MED ORDER — NAPROXEN DR 500 MG PO TBEC: 500.0000 mg | DELAYED_RELEASE_TABLET | Freq: Two times a day (BID) | ORAL | Status: DC | PRN

## 2015-01-16 MED ORDER — LISINOPRIL 10 MG PO TABS: 10.0000 mg | ORAL_TABLET | Freq: Every day | ORAL | Status: DC

## 2015-01-16 NOTE — Patient Instructions (Addendum)
Mrs. Amy Glass,  Thank you for coming in today  1. Diabetes: Refilled Amy Glass  Ophthalmology referral  2. HTN:  Refilled lisinopril 10 mg daily   3. Jaw pain and poor dentition: I suspect TMJ pain  Dental referral placed Naproxen   Soft diet: avoid bagels, steak apples   F/u in 3 months for diabetes   Dr. Adrian Blackwater

## 2015-01-16 NOTE — Progress Notes (Signed)
   Subjective:    Patient ID: Amy Glass, female    DOB: Dec 22, 1960, 54 y.o.   MRN: [id number] CC: jaw pain, diabetes f/u HPI  1. Jaw pain: R side mostly. No swelling. Trouble opening her mouth. Under stress. Pain has improved.  2. CHRONIC DIABETES  Disease Monitoring    Polyuria: yes   Visual problems: no   Medication Compliance: yes  Medication Side Effects  Hypoglycemia: no   Soc Hx: non smoker  Review of Systems  Constitutional: Negative for fever and chills.  HENT: Positive for dental problem. Negative for mouth sores.        Objective:   Physical Exam BP 136/82 mmHg  Pulse 96  Temp(Src) 98.6 F (37 C) (Oral)  Resp 16  Ht 4\' 11"  (1.499 m)  Wt 145 lb (65.772 kg)  BMI 29.27 kg/m2  SpO2 100% General appearance: alert, cooperative and no distress Throat: lips, mucosa, and tongue normal; teeth and gums normal  Lab Results  Component Value Date   HGBA1C 6.4 12/02/2014   CBG 150      Assessment & Plan:

## 2015-01-16 NOTE — Progress Notes (Signed)
Complaining of jaw pain. no Hx injury

## 2015-01-17 NOTE — Assessment & Plan Note (Signed)
Diabetes: Refilled [place]  Ophthalmology referral

## 2015-01-17 NOTE — Assessment & Plan Note (Signed)
Jaw pain and poor dentition: I suspect TMJ pain  Dental referral placed Naproxen

## 2015-01-18 ENCOUNTER — Ambulatory Visit: Payer: Medicare Other | Admitting: Gastroenterology

## 2015-01-20 ENCOUNTER — Ambulatory Visit: Payer: Medicare Other | Admitting: Family Medicine

## 2015-01-24 ENCOUNTER — Encounter: Payer: Self-pay | Admitting: Internal Medicine

## 2015-01-24 ENCOUNTER — Ambulatory Visit (AMBULATORY_SURGERY_CENTER): Payer: Medicare Other | Admitting: Internal Medicine

## 2015-01-24 VITALS — BP 116/75 | HR 73 | Temp 98.5°F | Resp 27 | Ht 58.5 in | Wt 146.0 lb

## 2015-01-24 DIAGNOSIS — R1319 Other dysphagia: Secondary | ICD-10-CM

## 2015-01-24 DIAGNOSIS — R1310 Dysphagia, unspecified: Secondary | ICD-10-CM | POA: Diagnosis not present

## 2015-01-24 DIAGNOSIS — D127 Benign neoplasm of rectosigmoid junction: Secondary | ICD-10-CM

## 2015-01-24 DIAGNOSIS — K635 Polyp of colon: Secondary | ICD-10-CM

## 2015-01-24 DIAGNOSIS — D123 Benign neoplasm of transverse colon: Secondary | ICD-10-CM

## 2015-01-24 DIAGNOSIS — I10 Essential (primary) hypertension: Secondary | ICD-10-CM | POA: Diagnosis not present

## 2015-01-24 DIAGNOSIS — R194 Change in bowel habit: Secondary | ICD-10-CM | POA: Diagnosis not present

## 2015-01-24 DIAGNOSIS — K625 Hemorrhage of anus and rectum: Secondary | ICD-10-CM | POA: Diagnosis not present

## 2015-01-24 DIAGNOSIS — R1314 Dysphagia, pharyngoesophageal phase: Secondary | ICD-10-CM

## 2015-01-24 DIAGNOSIS — Z860100 Personal history of colon polyps, unspecified: Secondary | ICD-10-CM

## 2015-01-24 DIAGNOSIS — E119 Type 2 diabetes mellitus without complications: Secondary | ICD-10-CM | POA: Diagnosis not present

## 2015-01-24 DIAGNOSIS — R198 Other specified symptoms and signs involving the digestive system and abdomen: Secondary | ICD-10-CM

## 2015-01-24 LAB — GLUCOSE, CAPILLARY
GLUCOSE-CAPILLARY: 95 mg/dL (ref 65–99)
Glucose-Capillary: 83 mg/dL (ref 65–99)

## 2015-01-24 MED ORDER — SODIUM CHLORIDE 0.9 % IV SOLN: 500.0000 mL | INTRAVENOUS | Status: DC

## 2015-01-24 MED ORDER — PANTOPRAZOLE SODIUM 40 MG PO TBEC: 40.0000 mg | DELAYED_RELEASE_TABLET | Freq: Every day | ORAL | Status: DC

## 2015-01-24 NOTE — Op Note (Signed)
[place]  [address] [place], [id number]   ENDOSCOPY PROCEDURE REPORT  PATIENT: Amy Glass, Amy Glass  MR#: [id number] BIRTHDATE: 07-05-1961 , 31  yrs. old GENDER: female ENDOSCOPIST: Jerene Bears, MD REFERRED BY:  Boykin Nearing, MD PROCEDURE DATE:  01/24/2015 PROCEDURE:  EGD, diagnostic and EGD w/ balloon dilation ASA CLASS:     Class II INDICATIONS:  dysphagia.  odynophagia. MEDICATIONS: Monitored anesthesia care and Propofol 250 mg IV TOPICAL ANESTHETIC: none  DESCRIPTION OF PROCEDURE: After the risks benefits and alternatives of the procedure were thoroughly explained, informed consent was obtained.  The LB [id number] endoscope was introduced through the mouth and advanced to the second portion of the duodenum , Without limitations.  The instrument was slowly withdrawn as the mucosa was fully examined.      ESOPHAGUS: There was LA Class A reflux esophagitis noted at GEJ.   A Schatzki ring was found at the gastroesophageal junction.  Using a TTS-balloon the stricture was dilated up to 20m (after 15 mm and 16.5 met no resistance).  The balloon was held inflated for 60 seconds.   The mucosa of the esophagus appeared otherwise normal.   STOMACH: A 2 cm hiatal hernia was noted.   The mucosa of the stomach appeared normal.  DUODENUM: The duodenal mucosa showed no abnormalities in the bulb and 2nd part of the duodenum.  Retroflexed views revealed a hiatal hernia.     The scope was then withdrawn from the patient and the procedure completed.  COMPLICATIONS: There were no immediate complications.   ENDOSCOPIC IMPRESSION: 1.   There was LA Class A reflux esophagitis noted 2.   Schatzki ring was found at the gastroesophageal junction; balloon dilation to 136m3.   The mucosa of the esophagus appeared otherwise normal 4.   2 cm hiatal hernia 5.   The mucosa of the stomach appeared normal 6.   The duodenal mucosa showed no abnormalities in the  bulb and 2nd part of the duodenum  RECOMMENDATIONS: 1.  Begin pantoprazole 40 mg daily for reflux given esophagitis and dysphagia 2.  Office followup in 6-8 weeks 3.  Reflux precautions/diet   eSigned:  JaJerene BearsMD 01/24/2015 3:14 PM    CCSU:PJSRPRXuAdrian BlackwaterMD and The Patient  PATIENT NAME:  [name], CapleyR#: [id number]

## 2015-01-24 NOTE — Progress Notes (Signed)
To recovery, report to Myers, RN, VSS. 

## 2015-01-24 NOTE — Op Note (Signed)
[place]  [address]. [place], [address]   COLONOSCOPY PROCEDURE REPORT  PATIENT: Amy Glass, Amy Glass  MR#: [id number] BIRTHDATE: Jun 16, 1961 , 4  yrs. old GENDER: female ENDOSCOPIST: Jerene Bears, MD REFERRED PQ:[id number] Funches, MD PROCEDURE DATE:  01/24/2015 PROCEDURE:   Colonoscopy, diagnostic and Colonoscopy with cold biopsy polypectomy First Screening Colonoscopy - Avg.  risk and is 50 yrs.  old or older - No.  Prior Negative Screening - Now for repeat screening. N/A  History of Adenoma - Now for follow-up colonoscopy & has been > or = to 3 yrs.  No.  It has been less than 3 yrs since last colonoscopy.  Medical reason.  Polyps removed today? Yes ASA CLASS:   Class II INDICATIONS:history of colon polyps, intermittent rectal bleeding, change in bowel habits. MEDICATIONS: Monitored anesthesia care, Propofol 150 mg IV, and Residual sedation present  DESCRIPTION OF PROCEDURE:   After the risks benefits and alternatives of the procedure were thoroughly explained, informed consent was obtained.  The digital rectal exam revealed no rectal mass.   The LB [id number]  endoscope was introduced through the anus and advanced to the cecum, which was identified by both the appendix and ileocecal valve. No adverse events experienced. The quality of the prep was good.  (Suprep was used)  The instrument was then slowly withdrawn as the colon was fully examined. Estimated blood loss is zero unless otherwise noted in this procedure report.  COLON FINDINGS: Two sessile polyps ranging from 2 to 66mm in size were found in the proximal transverse colon and rectosigmoid colon. Polypectomies were performed with cold forceps.  The resection was complete, the polyp tissue was completely retrieved and sent to histology.   There was mild diverticulosis noted in the ascending colon, descending colon, and sigmoid colon.   The examination was otherwise normal.  Retroflexed views  revealed no abnormalities. The time to cecum = 4.3 Withdrawal time = 11.5   The scope was withdrawn and the procedure completed. COMPLICATIONS: There were no immediate complications.  ENDOSCOPIC IMPRESSION: 1.   Two sessile polyps ranging from 2 to 22mm in size were found in the proximal transverse colon and rectosigmoid colon; polypectomies were performed with cold forceps 2.   Mild diverticulosis was noted in the ascending colon, descending colon, and sigmoid colon 3.   The examination was otherwise normal  RECOMMENDATIONS: 1.  Await pathology results 2.  High fiber diet 3.  Repeat Colonoscopy in 5 years. 4.  You will receive a letter within 1-2 weeks with the results of your biopsy as well as final recommendations.  Please call my office if you have not received a letter after 3 weeks.  eSigned:  Jerene Bears, MD 01/24/2015 3:17 PM   cc: Boykin Nearing, MD and The Patient

## 2015-01-24 NOTE — Patient Instructions (Addendum)

## 2015-01-24 NOTE — Progress Notes (Signed)
Called to room to assist during endoscopic procedure.  Patient ID and intended procedure confirmed with present staff. Received instructions for my participation in the procedure from the performing physician.  

## 2015-01-25 ENCOUNTER — Telehealth: Payer: Self-pay | Admitting: *Deleted

## 2015-01-25 NOTE — Telephone Encounter (Signed)
  Follow up Call-  Call back number 01/24/2015  Post procedure Call Back phone  # [phone] hm  Permission to leave phone message Yes     Patient questions:  Do you have a fever, pain , or abdominal swelling? No. Pain Score  0 *  Have you tolerated food without any problems? Yes.    Have you been able to return to your normal activities? Yes.    Do you have any questions about your discharge instructions: Diet   No. Medications  No. Follow up visit  No.  Do you have questions or concerns about your Care? No.  Actions: * If pain score is 4 or above: No action needed, pain <4.

## 2015-01-30 ENCOUNTER — Ambulatory Visit: Payer: Medicare Other

## 2015-01-30 ENCOUNTER — Ambulatory Visit: Payer: Medicare Other | Attending: Sports Medicine | Admitting: Physical Therapy

## 2015-01-30 ENCOUNTER — Encounter: Payer: Self-pay | Admitting: Physical Therapy

## 2015-01-30 DIAGNOSIS — G8929 Other chronic pain: Secondary | ICD-10-CM | POA: Insufficient documentation

## 2015-01-30 DIAGNOSIS — M79661 Pain in right lower leg: Secondary | ICD-10-CM | POA: Insufficient documentation

## 2015-01-30 DIAGNOSIS — M25561 Pain in right knee: Secondary | ICD-10-CM | POA: Diagnosis not present

## 2015-01-30 DIAGNOSIS — M25551 Pain in right hip: Secondary | ICD-10-CM
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
## 2015-01-31 ENCOUNTER — Encounter: Payer: Self-pay | Admitting: Internal Medicine

## 2015-02-02 ENCOUNTER — Ambulatory Visit: Payer: Medicare Other | Admitting: Physical Therapy

## 2015-02-06 ENCOUNTER — Ambulatory Visit: Payer: Medicare Other | Admitting: Sports Medicine

## 2015-02-09 DIAGNOSIS — M25512 Pain in left shoulder: Secondary | ICD-10-CM | POA: Diagnosis not present

## 2015-02-09 DIAGNOSIS — M5136 Other intervertebral disc degeneration, lumbar region: Secondary | ICD-10-CM | POA: Diagnosis not present

## 2015-02-09 DIAGNOSIS — Z79899 Other long term (current) drug therapy: Secondary | ICD-10-CM | POA: Diagnosis not present

## 2015-02-09 DIAGNOSIS — G8929 Other chronic pain: Secondary | ICD-10-CM | POA: Diagnosis not present

## 2015-02-09 DIAGNOSIS — F329 Major depressive disorder, single episode, unspecified: Secondary | ICD-10-CM | POA: Diagnosis not present

## 2015-02-16 DIAGNOSIS — G8929 Other chronic pain: Secondary | ICD-10-CM | POA: Diagnosis not present

## 2015-02-16 DIAGNOSIS — M5136 Other intervertebral disc degeneration, lumbar region: Secondary | ICD-10-CM | POA: Diagnosis not present

## 2015-02-16 DIAGNOSIS — M25512 Pain in left shoulder: Secondary | ICD-10-CM | POA: Diagnosis not present

## 2015-02-20 ENCOUNTER — Telehealth: Payer: Self-pay | Admitting: Family Medicine

## 2015-02-20 NOTE — Telephone Encounter (Signed)
Pt. Has medicare part a and b only, she is having tooth issues, she was advised of scheduling an appt with her pcp here and was also informed of the [employer] org. To inquire about dental assistance......pt. Would like to know what her pcp would advice her to do if she is unable to receive any assistance for her dental issues.Marland KitchenMarland Kitchen

## 2015-02-22 DIAGNOSIS — Z79899 Other long term (current) drug therapy: Secondary | ICD-10-CM | POA: Diagnosis not present

## 2015-02-22 DIAGNOSIS — M5136 Other intervertebral disc degeneration, lumbar region: Secondary | ICD-10-CM | POA: Diagnosis not present

## 2015-02-22 DIAGNOSIS — G8929 Other chronic pain: Secondary | ICD-10-CM | POA: Diagnosis not present

## 2015-02-22 NOTE — Telephone Encounter (Signed)
Pt requesting Dentist information  Mail list of Dentist to pt

## 2015-03-01 ENCOUNTER — Encounter: Payer: Self-pay | Admitting: *Deleted

## 2015-03-07 DIAGNOSIS — F319 Bipolar disorder, unspecified: Secondary | ICD-10-CM | POA: Diagnosis not present

## 2015-03-13 ENCOUNTER — Emergency Department (HOSPITAL_COMMUNITY)
Admission: EM | Admit: 2015-03-13 | Discharge: 2015-03-14 | Disposition: A | Discharge status: Home / Self Care | Payer: Medicare Other | Attending: Emergency Medicine | Admitting: Emergency Medicine

## 2015-03-13 ENCOUNTER — Telehealth: Payer: Self-pay | Admitting: Family Medicine

## 2015-03-13 ENCOUNTER — Encounter (HOSPITAL_COMMUNITY): Payer: Self-pay | Admitting: Vascular Surgery

## 2015-03-13 DIAGNOSIS — R1084 Generalized abdominal pain: Secondary | ICD-10-CM

## 2015-03-13 DIAGNOSIS — R160 Hepatomegaly, not elsewhere classified: Secondary | ICD-10-CM | POA: Diagnosis not present

## 2015-03-13 DIAGNOSIS — F419 Anxiety disorder, unspecified: Secondary | ICD-10-CM | POA: Diagnosis not present

## 2015-03-13 DIAGNOSIS — F329 Major depressive disorder, single episode, unspecified: Secondary | ICD-10-CM | POA: Diagnosis not present

## 2015-03-13 DIAGNOSIS — E119 Type 2 diabetes mellitus without complications: Secondary | ICD-10-CM | POA: Diagnosis not present

## 2015-03-13 DIAGNOSIS — R109 Unspecified abdominal pain: Secondary | ICD-10-CM | POA: Diagnosis present

## 2015-03-13 DIAGNOSIS — Z3202 Encounter for pregnancy test, result negative: Secondary | ICD-10-CM | POA: Diagnosis not present

## 2015-03-13 DIAGNOSIS — Z79899 Other long term (current) drug therapy: Secondary | ICD-10-CM | POA: Diagnosis not present

## 2015-03-13 DIAGNOSIS — Z86010 Personal history of colonic polyps: Secondary | ICD-10-CM | POA: Diagnosis not present

## 2015-03-13 DIAGNOSIS — R140 Abdominal distension (gaseous): Secondary | ICD-10-CM | POA: Diagnosis not present

## 2015-03-13 DIAGNOSIS — R1011 Right upper quadrant pain: Secondary | ICD-10-CM | POA: Diagnosis not present

## 2015-03-13 DIAGNOSIS — K429 Umbilical hernia without obstruction or gangrene: Secondary | ICD-10-CM | POA: Diagnosis not present

## 2015-03-13 DIAGNOSIS — G8929 Other chronic pain: Secondary | ICD-10-CM | POA: Insufficient documentation

## 2015-03-13 DIAGNOSIS — M545 Low back pain: Secondary | ICD-10-CM | POA: Diagnosis not present

## 2015-03-13 DIAGNOSIS — R110 Nausea: Secondary | ICD-10-CM | POA: Diagnosis not present

## 2015-03-13 LAB — URINE MICROSCOPIC-ADD ON

## 2015-03-13 LAB — COMPREHENSIVE METABOLIC PANEL
ALBUMIN: 4.1 g/dL (ref 3.5–5.0)
ALT: 21 U/L (ref 14–54)
ANION GAP: 11 (ref 5–15)
AST: 32 U/L (ref 15–41)
Alkaline Phosphatase: 97 U/L (ref 38–126)
BUN: 12 mg/dL (ref 6–20)
CHLORIDE: 99 mmol/L — AB (ref 101–111)
CO2: 24 mmol/L (ref 22–32)
Calcium: 9.6 mg/dL (ref 8.9–10.3)
Creatinine, Ser: 0.45 mg/dL (ref 0.44–1.00)
GFR calc Af Amer: >60 mL/min (ref >60)
Glucose, Bld: 178 mg/dL — ABNORMAL HIGH (ref 65–99)
POTASSIUM: 4.2 mmol/L (ref 3.5–5.1)
Sodium: 134 mmol/L — ABNORMAL LOW (ref 135–145)
Total Bilirubin: 0.9 mg/dL (ref 0.3–1.2)
Total Protein: 7.2 g/dL (ref 6.5–8.1)

## 2015-03-13 LAB — CBC
HEMATOCRIT: 39.5 % (ref 36.0–46.0)
HEMOGLOBIN: 13.5 g/dL (ref 12.0–15.0)
MCH: 28.5 pg (ref 26.0–34.0)
MCHC: 34.2 g/dL (ref 30.0–36.0)
MCV: 83.3 fL (ref 78.0–100.0)
Platelets: 273 10*3/uL (ref 150–400)
RBC: 4.74 MIL/uL (ref 3.87–5.11)
RDW: 15.1 % (ref 11.5–15.5)
WBC: 7.2 10*3/uL (ref 4.0–10.5)

## 2015-03-13 LAB — URINALYSIS, ROUTINE W REFLEX MICROSCOPIC
Bilirubin Urine: NEGATIVE
Glucose, UA: NEGATIVE mg/dL
Ketones, ur: NEGATIVE mg/dL
LEUKOCYTES UA: NEGATIVE
NITRITE: NEGATIVE
PH: 5.5 (ref 5.0–8.0)
Protein, ur: 100 mg/dL — AB
SPECIFIC GRAVITY, URINE: 1.026 (ref 1.005–1.030)
Urobilinogen, UA: 0.2 mg/dL (ref 0.0–1.0)

## 2015-03-13 LAB — LIPASE, BLOOD: LIPASE: 42 U/L (ref 22–51)

## 2015-03-13 LAB — POC URINE PREG, ED: PREG TEST UR: NEGATIVE

## 2015-03-13 NOTE — Telephone Encounter (Signed)
Patient called requesting medication refill on her cholesterol medication, patient states PCP has not prescribed it but patient is running low on medication. Please f/u

## 2015-03-13 NOTE — ED Notes (Signed)
Pt reports to the ED for eval of nausea and abd pain x 2 days. She denies any active V/D. She also reports bilateral flank pain. Pt denies any urinary symptoms, vaginal bleeding, or d/c. She reports chills but unknown fevers. Pt A&Ox4, resp e/u, and skin warm and dry.

## 2015-03-14 ENCOUNTER — Encounter (HOSPITAL_COMMUNITY): Payer: Self-pay

## 2015-03-14 ENCOUNTER — Emergency Department (HOSPITAL_COMMUNITY): Payer: Medicare Other

## 2015-03-14 DIAGNOSIS — R160 Hepatomegaly, not elsewhere classified: Secondary | ICD-10-CM | POA: Diagnosis not present

## 2015-03-14 DIAGNOSIS — K429 Umbilical hernia without obstruction or gangrene: Secondary | ICD-10-CM | POA: Diagnosis not present

## 2015-03-14 MED ORDER — IOHEXOL 300 MG/ML  SOLN
80.0000 mL | Freq: Once | INTRAMUSCULAR | Status: AC | PRN
  Administered 2015-03-14: 100 mL via INTRAVENOUS

## 2015-03-14 MED ORDER — DICYCLOMINE HCL 10 MG PO CAPS
10.0000 mg | ORAL_CAPSULE | Freq: Once | ORAL | Status: AC
  Administered 2015-03-14: 10 mg via ORAL
  Filled 2015-03-14: qty 1

## 2015-03-14 MED ORDER — ONDANSETRON 4 MG PO TBDP: ORAL_TABLET | ORAL | Status: DC

## 2015-03-14 MED ORDER — DICYCLOMINE HCL 20 MG PO TABS: 20.0000 mg | ORAL_TABLET | Freq: Two times a day (BID) | ORAL | Status: DC | PRN

## 2015-03-14 NOTE — ED Notes (Signed)
Patient now fiinished consuming PO contrast.

## 2015-03-14 NOTE — Discharge Instructions (Signed)
Abdominal Pain, Women °Abdominal (stomach, pelvic, or belly) pain can be caused by many things. It is important to tell your doctor: °· The location of the pain. °· Does it come and go or is it present all the time? °· Are there things that start the pain (eating certain foods, exercise)? °· Are there other symptoms associated with the pain (fever, nausea, vomiting, diarrhea)? °All of this is helpful to know when trying to find the cause of the pain. °CAUSES  °· Stomach: virus or bacteria infection, or ulcer. °· Intestine: appendicitis (inflamed appendix), regional ileitis (Crohn's disease), ulcerative colitis (inflamed colon), irritable bowel syndrome, diverticulitis (inflamed diverticulum of the colon), or cancer of the stomach or intestine. °· Gallbladder disease or stones in the gallbladder. °· Kidney disease, kidney stones, or infection. °· Pancreas infection or cancer. °· Fibromyalgia (pain disorder). °· Diseases of the female organs: °¨ Uterus: fibroid (non-cancerous) tumors or infection. °¨ Fallopian tubes: infection or tubal pregnancy. °¨ Ovary: cysts or tumors. °¨ Pelvic adhesions (scar tissue). °¨ Endometriosis (uterus lining tissue growing in the pelvis and on the pelvic organs). °¨ Pelvic congestion syndrome (female organs filling up with blood just before the menstrual period). °¨ Pain with the menstrual period. °¨ Pain with ovulation (producing an egg). °¨ Pain with an IUD (intrauterine device, birth control) in the uterus. °¨ Cancer of the female organs. °· Functional pain (pain not caused by a disease, may improve without treatment). °· Psychological pain. °· Depression. °DIAGNOSIS  °Your doctor will decide the seriousness of your pain by doing an examination. °· Blood tests. °· X-rays. °· Ultrasound. °· CT scan (computed tomography, special type of X-ray). °· MRI (magnetic resonance imaging). °· Cultures, for infection. °· Barium enema (dye inserted in the large intestine, to better view it with  X-rays). °· Colonoscopy (looking in intestine with a lighted tube). °· Laparoscopy (minor surgery, looking in abdomen with a lighted tube). °· Major abdominal exploratory surgery (looking in abdomen with a large incision). °TREATMENT  °The treatment will depend on the cause of the pain.  °· Many cases can be observed and treated at home. °· Over-the-counter medicines recommended by your caregiver. °· Prescription medicine. °· Antibiotics, for infection. °· Birth control pills, for painful periods or for ovulation pain. °· Hormone treatment, for endometriosis. °· Nerve blocking injections. °· Physical therapy. °· Antidepressants. °· Counseling with a psychologist or psychiatrist. °· Minor or major surgery. °HOME CARE INSTRUCTIONS  °· Do not take laxatives, unless directed by your caregiver. °· Take over-the-counter pain medicine only if ordered by your caregiver. Do not take aspirin because it can cause an upset stomach or bleeding. °· Try a clear liquid diet (broth or water) as ordered by your caregiver. Slowly move to a bland diet, as tolerated, if the pain is related to the stomach or intestine. °· Have a thermometer and take your temperature several times a day, and record it. °· Bed rest and sleep, if it helps the pain. °· Avoid sexual intercourse, if it causes pain. °· Avoid stressful situations. °· Keep your follow-up appointments and tests, as your caregiver orders. °· If the pain does not go away with medicine or surgery, you may try: °¨ Acupuncture. °¨ Relaxation exercises (yoga, meditation). °¨ Group therapy. °¨ Counseling. °SEEK MEDICAL CARE IF:  °· You notice certain foods cause stomach pain. °· Your home care treatment is not helping your pain. °· You need stronger pain medicine. °· You want your IUD removed. °· You feel faint or   lightheaded. °· You develop nausea and vomiting. °· You develop a rash. °· You are having side effects or an allergy to your medicine. °SEEK IMMEDIATE MEDICAL CARE IF:  °· Your  pain does not go away or gets worse. °· You have a fever. °· Your pain is felt only in portions of the abdomen. The right side could possibly be appendicitis. The left lower portion of the abdomen could be colitis or diverticulitis. °· You are passing blood in your stools (bright red or black tarry stools, with or without vomiting). °· You have blood in your urine. °· You develop chills, with or without a fever. °· You pass out. °MAKE SURE YOU:  °· Understand these instructions. °· Will watch your condition. °· Will get help right away if you are not doing well or get worse. °Document Released: 05/05/2007 Document Revised: 11/22/2013 Document Reviewed: 05/25/2009 °Exit[employer]® Patient Information ©2015 [employer]. This information is not intended to replace advice given to you by your health care provider. Make sure you discuss any questions you have with your health care provider. ° °

## 2015-03-14 NOTE — ED Notes (Signed)
Patient currently drinking contrast.

## 2015-03-14 NOTE — ED Provider Notes (Signed)
CSN: [id number]     Arrival date & time 03/13/15  2051 History  This chart was scribed for Amy Rice, MD by Amy Glass, ED Scribe. This patient was seen in room D34C/D34C and the patient's care was started at 12:04 AM.    Chief Complaint  Patient presents with  . Abdominal Pain   The history is provided by the patient. No language interpreter was used.   HPI Comments: Amy Glass is a 54 y.o. female who presents to the Emergency Department complaining of chronic abdominal pain for several years that has recently worsened over the past 2 days. She states she has associated distention and nausea. She states that she would sometimes eat different foods and that would provide relief sometimes. She states that receiving a back massage would provide slight relief. She states that he last normal BM was this morning. Denies constipation, blood in stool, fever, vomiting, dysuria or other related symptoms.   Past Medical History  Diagnosis Date  . Anxiety     as child   . Depression     as child  . Diabetes mellitus without complication Dx 2878  . Hypertension Dx 2003  . Allergy   . Schatzki's ring   . Hiatal hernia   . Hyperplastic colon polyp    Past Surgical History  Procedure Laterality Date  . Shoulder surgery  08/2013     b/l shoulder   . Back surgery  2002    lumbar  . Cesarean section  05/20/2003    Family History  Problem Relation Age of Onset  . Diabetes Mother   . Heart disease Mother   . Hyperlipidemia Mother   . Hypertension Mother   . Renal cancer Maternal Aunt   . Stomach cancer Maternal Uncle   . Colon cancer Neg Hx   . Esophageal cancer Neg Hx   . Rectal cancer Neg Hx    Social History  Substance Use Topics  . Smoking status: Never Smoker   . Smokeless tobacco: Never Used  . Alcohol Use: No   OB History    No data available     Review of Systems  Constitutional: Negative for fever and chills.  Respiratory: Negative for shortness of breath.    Cardiovascular: Negative for chest pain, palpitations and leg swelling.  Gastrointestinal: Positive for nausea, abdominal pain and abdominal distention. Negative for vomiting, diarrhea, constipation and blood in stool.  Genitourinary: Negative for dysuria.  Musculoskeletal: Positive for myalgias and back pain. Negative for neck pain and neck stiffness.  Skin: Negative for rash and wound.  Neurological: Negative for dizziness, weakness, light-headedness, numbness and headaches.  All other systems reviewed and are negative.    Allergies  Nitroglycerin  Home Medications   Prior to Admission medications   Medication Sig Start Date End Date Taking? Authorizing Provider  Cholecalciferol (VITAMIN D3) 2000 UNITS TABS Take 2,000 Units by mouth daily. 12/05/14  Yes Josalyn Funches, MD  DULoxetine (CYMBALTA) 20 MG capsule Take 80 mg by mouth daily. Take 4 tablets a day 12/21/14  Yes Historical Provider, MD  lamoTRIgine (LAMICTAL) 150 MG tablet Take 150 mg by mouth daily.   Yes Historical Provider, MD  lisinopril (PRINIVIL,ZESTRIL) 10 MG tablet Take 1 tablet (10 mg total) by mouth daily. 01/16/15  Yes Boykin Nearing, MD  oxyCODONE-acetaminophen (PERCOCET/ROXICET) 5-325 MG per tablet Take 0.5-1 tablets by mouth every 4 (four) hours as needed for moderate pain or severe pain.   Yes Historical Provider, MD  sitaGLIPtin-metformin Carlyn Reichert)  50-500 MG per tablet Take 1 tablet by mouth 2 (two) times daily with a meal. 01/16/15  Yes Josalyn Funches, MD  traZODone (DESYREL) 50 MG tablet Take 50 mg by mouth at bedtime. 10/08/14  Yes Historical Provider, MD  dicyclomine (BENTYL) 20 MG tablet Take 1 tablet (20 mg total) by mouth 2 (two) times daily as needed for spasms. 03/14/15   Amy Rice, MD  ondansetron (ZOFRAN ODT) 4 MG disintegrating tablet 4mg  ODT q4 hours prn nausea/vomit 03/14/15   Amy Rice, MD  pantoprazole (PROTONIX) 40 MG tablet Take 1 tablet (40 mg total) by mouth daily. Patient not taking:  Reported on 03/14/2015 01/24/15   Amy Bears, MD   BP 156/100 mmHg  Pulse 74  Temp(Src) 97.6 F (36.4 C) (Oral)  Resp 18  SpO2 99%   Physical Exam  Constitutional: She is oriented to person, place, and time. She appears well-developed and well-nourished. No distress.  HENT:  Head: Normocephalic and atraumatic.  Mouth/Throat: Oropharynx is clear and moist.  Eyes: EOM are normal. Pupils are equal, round, and reactive to light.  Neck: Normal range of motion. Neck supple.  Cardiovascular: Normal rate and regular rhythm.   Pulmonary/Chest: Effort normal and breath sounds normal. No respiratory distress. She has no wheezes. She has no rales.  Abdominal: Soft. Bowel sounds are normal. She exhibits no distension and no mass. There is tenderness ( right upper quadrant tenderness with palpation no rebound or guarding.). There is no rebound and no guarding.  Musculoskeletal: Normal range of motion. She exhibits no edema or tenderness.  No CVA tenderness bilaterally. Patient does have bilateral paraspinal lumbar tenderness to palpation. No midline thoracic or lumbar tenderness.  Neurological: She is alert and oriented to person, place, and time.  Moves all extremities without deficit. Sensation is fully intact. Patient is ambulatory without any difficulty.  Skin: Skin is warm and dry. No rash noted. No erythema.  Psychiatric: She has a normal mood and affect. Her behavior is normal.  Nursing note and vitals reviewed.   ED Course  Procedures (including critical care time) DIAGNOSTIC STUDIES: Oxygen Saturation is 99% on RA, normal by my interpretation.    COORDINATION OF CARE: 12:13 AM-Discussed treatment plan with pt at bedside and pt agreed to plan.     Labs Review Labs Reviewed  COMPREHENSIVE METABOLIC PANEL - Abnormal; Notable for the following:    Sodium 134 (*)    Chloride 99 (*)    Glucose, Bld 178 (*)    All other components within normal limits  URINALYSIS, ROUTINE W REFLEX  MICROSCOPIC (NOT AT [facility]) - Abnormal; Notable for the following:    APPearance CLOUDY (*)    Hgb urine dipstick TRACE (*)    Protein, ur 100 (*)    All other components within normal limits  URINE MICROSCOPIC-ADD ON - Abnormal; Notable for the following:    Squamous Epithelial / LPF MANY (*)    Crystals CA OXALATE CRYSTALS (*)    All other components within normal limits  LIPASE, BLOOD  CBC  POC URINE PREG, ED    Imaging Review Ct Abdomen Pelvis W Contrast  03/14/2015   CLINICAL DATA:  Abdominal pain and nausea for 2 days.  EXAM: CT ABDOMEN AND PELVIS WITH CONTRAST  TECHNIQUE: Multidetector CT imaging of the abdomen and pelvis was performed using the standard protocol following bolus administration of intravenous contrast.  CONTRAST:  169mL OMNIPAQUE IOHEXOL 300 MG/ML  SOLN  COMPARISON:  None.  FINDINGS: Lower chest:  The included lung bases are clear.  Liver: Prominent size measuring 19.5 cm in cranial caudal dimension. No focal hepatic lesion.  Hepatobiliary: Gallbladder decompressed.  No biliary dilatation.  Pancreas: Normal.  Spleen: Normal.  Adrenal glands: No nodule.  Kidneys: Symmetric renal enhancement. No hydronephrosis. Subcentimeter cyst in the interpolar left kidney.  Stomach/Bowel: Stomach physiologically distended. There are no dilated or thickened small bowel loops. Moderate volume of stool throughout the colon without colonic wall thickening. The appendix is normal.  Vascular/Lymphatic: No retroperitoneal adenopathy. Abdominal aorta is normal in caliber. Minimal atherosclerosis.  Reproductive: The uterus is bulbous with probable fibroids involving the posterior fundus and lower uterine segment Ovaries are relatively symmetric in size. No adnexal mass or free fluid.  Bladder: Physiologically distended.  Other: No free air, free fluid, or intra-abdominal fluid collection. Small fat containing umbilical hernia.  Musculoskeletal: There are no acute or suspicious osseous abnormalities.  Postsurgical change at L4-L5.  IMPRESSION: 1. No acute abnormality in the abdomen/pelvis. 2. Mild hepatomegaly without focal lesion. 3. Probable uterine fibroids.   Electronically Signed   By: Jeb Levering M.D.   On: 03/14/2015 01:36   I have personally reviewed and evaluated these images and lab results as part of my medical decision-making.   EKG Interpretation None      MDM   Final diagnoses:  Generalized abdominal pain  Nausea      I personally performed the services described in this documentation, which was scribed in my presence. The recorded information has been reviewed and is accurate.  CT scan without any definite cause for the patient's symptoms. Laboratory workup is normal. Patient's symptoms have improved. She has follow-up appointment with her gastroenterologist. She's been given return precautions and is voiced understanding.     Amy Rice, MD 03/14/15 [phone]

## 2015-03-14 NOTE — ED Notes (Signed)
Dr. Leda Quail at the bedside.

## 2015-03-20 DIAGNOSIS — E119 Type 2 diabetes mellitus without complications: Secondary | ICD-10-CM | POA: Diagnosis not present

## 2015-03-20 DIAGNOSIS — F329 Major depressive disorder, single episode, unspecified: Secondary | ICD-10-CM | POA: Diagnosis not present

## 2015-03-20 DIAGNOSIS — E780 Pure hypercholesterolemia: Secondary | ICD-10-CM | POA: Diagnosis not present

## 2015-03-21 NOTE — Telephone Encounter (Signed)
Verified date of birth Pt stated need cholesterol medication Pt advised to schedule lab appointment. Lipids was order in May  Transfer call to front office for appointment

## 2015-03-28 ENCOUNTER — Ambulatory Visit (INDEPENDENT_AMBULATORY_CARE_PROVIDER_SITE_OTHER): Payer: Medicare Other | Admitting: Internal Medicine

## 2015-03-28 ENCOUNTER — Encounter: Payer: Self-pay | Admitting: Internal Medicine

## 2015-03-28 VITALS — BP 138/96 | HR 96 | Ht 58.5 in | Wt 153.4 lb

## 2015-03-28 DIAGNOSIS — R1013 Epigastric pain: Secondary | ICD-10-CM | POA: Diagnosis not present

## 2015-03-28 DIAGNOSIS — R109 Unspecified abdominal pain: Secondary | ICD-10-CM

## 2015-03-28 DIAGNOSIS — K2100 Gastro-esophageal reflux disease with esophagitis, without bleeding: Secondary | ICD-10-CM

## 2015-03-28 DIAGNOSIS — K589 Irritable bowel syndrome without diarrhea: Secondary | ICD-10-CM

## 2015-03-28 MED ORDER — OMEPRAZOLE 20 MG PO CPDR: 40.0000 mg | DELAYED_RELEASE_CAPSULE | Freq: Every day | ORAL | Status: DC

## 2015-03-28 MED ORDER — DICYCLOMINE HCL 20 MG PO TABS: 20.0000 mg | ORAL_TABLET | Freq: Three times a day (TID) | ORAL | Status: DC | PRN

## 2015-03-28 NOTE — Progress Notes (Signed)
Subjective:    Patient ID: Amy Glass, female    DOB: Mar 01, 1961, 54 y.o.   MRN: [id number]  HPI Amy Glass is a 54 year old female with past medical history of colon polyps, GERD with esophagitis, esophageal dysphagia, hypertension, diabetes, anxiety and depression who is seen in follow-up. She was initially seen in June 2016 to evaluate rectal bleeding and solid food dysphagia. She came for upper and lower endoscopy on 01/24/2015. EGD revealed mild reflux esophagitis and Schatzki's ring at the GEJ.  Balloon dilation to 18 mm was performed across the GE junction. A 2 cm hiatal hernia was noted. The stomach and duodenum appear normal. Colonoscopy showed 2 diminutive colon polyps in the proximal transverse and rectosigmoid colon. These were hyperplastic by pathology. There is mild diverticulosis in the ascending and left colon and the exam is otherwise normal.  She reports that she has continued to have issues with heartburn epigastric and left-sided abdominal cramping. Previously she was having cramping in her lower back and also midabdomen associated with menstrual periods. Now it has become somewhat more frequent and can be associated with nausea. She's been gaining weight. She decided to stop all of her medications and was offered about 6 weeks she has since added back Janumet and lisinopril. She did not start pantoprazole as recommended at the time of her esophagitis diagnosis. She was having issues with constipation but after stopping her medicine shes had 2-3 soft but formed stools daily. She was seen in the ER recently to evaluate her abdominal discomfort. CT scan was performed which was unremarkable. See below.  No recent rectal bleeding or melena. She was given dicyclomine by the ER and this helps with cramping but she ran out. She did try to fill the pantoprazole but it was too expensive.   Review of Systems As per history of present illness, otherwise negative  Current  Medications, Allergies, Past Medical History, Past Surgical History, Family History and Social History were reviewed in [employer] record.     Objective:   Physical Exam BP 138/96 mmHg  Pulse 96  Ht 4' 10.5" (1.486 m)  Wt 153 lb 6 oz (69.57 kg)  BMI 31.51 kg/m2 Constitutional: Well-developed and well-nourished. No distress. HEENT: Normocephalic and atraumatic. Conjunctivae are normal.  No scleral icterus. Neck: Neck supple. Trachea midline. Cardiovascular: Normal rate, regular rhythm and intact distal pulses. No M/R/G Pulmonary/chest: Effort normal and breath sounds normal. No wheezing, rales or rhonchi. Abdominal: Soft, nontender, nondistended. Bowel sounds active throughout. Extremities: no clubbing, cyanosis, or edema Neurological: Alert and oriented to person place and time. Skin: Skin is warm and dry. No rashes noted. Psychiatric: Normal mood and affect. Behavior is normal.  Egd/colon reviewed with the patient  CBC    Component Value Date/Time   WBC 7.2 03/13/2015 2138   RBC 4.74 03/13/2015 2138   HGB 13.5 03/13/2015 2138   HCT 39.5 03/13/2015 2138   PLT 273 03/13/2015 2138   MCV 83.3 03/13/2015 2138   MCH 28.5 03/13/2015 2138   MCHC 34.2 03/13/2015 2138   RDW 15.1 03/13/2015 2138   LYMPHSABS 2.5 11/26/2014 2150   MONOABS 0.5 11/26/2014 2150   EOSABS 0.2 11/26/2014 2150   BASOSABS 0.1 11/26/2014 2150    CMP     Component Value Date/Time   NA 134* 03/13/2015 2138   K 4.2 03/13/2015 2138   CL 99* 03/13/2015 2138   CO2 24 03/13/2015 2138   GLUCOSE 178* 03/13/2015 2138   BUN 12 03/13/2015  2138   CREATININE 0.45 03/13/2015 2138   CALCIUM 9.6 03/13/2015 2138   PROT 7.2 03/13/2015 2138   ALBUMIN 4.1 03/13/2015 2138   AST 32 03/13/2015 2138   ALT 21 03/13/2015 2138   ALKPHOS 97 03/13/2015 2138   BILITOT 0.9 03/13/2015 2138   GFRNONAA >60 03/13/2015 2138   GFRAA >60 03/13/2015 2138    Lipase     Component Value Date/Time   LIPASE 42  03/13/2015 2138   Lab Results  Component Value Date   TSH 1.625 12/02/2014   Celiac panel neg  CT ABDOMEN AND PELVIS WITH CONTRAST   TECHNIQUE: Multidetector CT imaging of the abdomen and pelvis was performed using the standard protocol following bolus administration of intravenous contrast.   CONTRAST:  145mL OMNIPAQUE IOHEXOL 300 MG/ML  SOLN   COMPARISON:  None.   FINDINGS: Lower chest:  The included lung bases are clear.   Liver: Prominent size measuring 19.5 cm in cranial caudal dimension. No focal hepatic lesion.   Hepatobiliary: Gallbladder decompressed.  No biliary dilatation.   Pancreas: Normal.   Spleen: Normal.   Adrenal glands: No nodule.   Kidneys: Symmetric renal enhancement. No hydronephrosis. Subcentimeter cyst in the interpolar left kidney.   Stomach/Bowel: Stomach physiologically distended. There are no dilated or thickened small bowel loops. Moderate volume of stool throughout the colon without colonic wall thickening. The appendix is normal.   Vascular/Lymphatic: No retroperitoneal adenopathy. Abdominal aorta is normal in caliber. Minimal atherosclerosis.   Reproductive: The uterus is bulbous with probable fibroids involving the posterior fundus and lower uterine segment Ovaries are relatively symmetric in size. No adnexal mass or free fluid.   Bladder: Physiologically distended.   Other: No free air, free fluid, or intra-abdominal fluid collection. Small fat containing umbilical hernia.   Musculoskeletal: There are no acute or suspicious osseous abnormalities. Postsurgical change at L4-L5.   IMPRESSION: 1. No acute abnormality in the abdomen/pelvis. 2. Mild hepatomegaly without focal lesion. 3. Probable uterine fibroids.     Electronically Signed   By: Jeb Levering M.D.   On: 03/14/2015 01:36     Assessment & Plan:  54 year old female with past medical history of colon polyps, GERD with esophagitis, esophageal dysphagia,  hypertension, diabetes, anxiety and depression who is seen in follow-up.  1. GERD/esophagitis/epigastric pain -- her epigastric pain is near the xiphoid and this likely relates to uncontrolled reflux and possibly ongoing esophagitis. I recommended she begin PPI therapy and we discussed the reasons why. Pantoprazole was too expensive and so we will try Nexium 40 mg daily as this appears to be covered by her insurance.  2. Crampy abdominal pain -- likely irritable bowel related. This may have worsened after she stopped her Cymbalta on her own. We discussed how this medication can help with chronic abdominal pain particularly in IBS. I asked that she discuss restarting this medication with her primary care provider. Refill Bentyl 20 mg 3 times a day when necessary.  3. Dysphagia -- she reports this did improve after dilation. Occasionally has issues with food feeling like it sticks which is likely related to uncontrolled esophagitis. We are starting PPI as discussed in #1.  4. History of colon polyps -- hyperplastic at last check, repeat colonoscopy recommended in 2021 July.  Follow-up in 3-4 months, sooner if necessary

## 2015-03-28 NOTE — Patient Instructions (Addendum)
We have sent the following medications to your pharmacy for you to pick up at your convenience: Omeprazole 40 mg daily Bentyl 20 mg three times daily as needed.  Please call our office if medication is too expensive.  Follow up with Dr Hilarie Fredrickson in 6 months or sooner if needed.

## 2015-04-13 ENCOUNTER — Telehealth: Payer: Self-pay | Admitting: Internal Medicine

## 2015-04-13 ENCOUNTER — Emergency Department (HOSPITAL_COMMUNITY): Payer: No Typology Code available for payment source

## 2015-04-13 ENCOUNTER — Encounter (HOSPITAL_COMMUNITY): Payer: Self-pay | Admitting: Emergency Medicine

## 2015-04-13 ENCOUNTER — Emergency Department (HOSPITAL_COMMUNITY)
Admission: EM | Admit: 2015-04-13 | Discharge: 2015-04-13 | Disposition: A | Discharge status: Home / Self Care | Payer: No Typology Code available for payment source | Attending: Emergency Medicine | Admitting: Emergency Medicine

## 2015-04-13 DIAGNOSIS — F419 Anxiety disorder, unspecified: Secondary | ICD-10-CM | POA: Insufficient documentation

## 2015-04-13 DIAGNOSIS — T148XXA Other injury of unspecified body region, initial encounter: Secondary | ICD-10-CM

## 2015-04-13 DIAGNOSIS — Z8719 Personal history of other diseases of the digestive system: Secondary | ICD-10-CM | POA: Diagnosis not present

## 2015-04-13 DIAGNOSIS — S161XXA Strain of muscle, fascia and tendon at neck level, initial encounter: Secondary | ICD-10-CM | POA: Diagnosis not present

## 2015-04-13 DIAGNOSIS — Y9389 Activity, other specified: Secondary | ICD-10-CM | POA: Diagnosis not present

## 2015-04-13 DIAGNOSIS — Y92410 Unspecified street and highway as the place of occurrence of the external cause: Secondary | ICD-10-CM | POA: Diagnosis not present

## 2015-04-13 DIAGNOSIS — M25511 Pain in right shoulder: Secondary | ICD-10-CM | POA: Diagnosis not present

## 2015-04-13 DIAGNOSIS — E119 Type 2 diabetes mellitus without complications: Secondary | ICD-10-CM | POA: Diagnosis not present

## 2015-04-13 DIAGNOSIS — R55 Syncope and collapse: Secondary | ICD-10-CM | POA: Diagnosis not present

## 2015-04-13 DIAGNOSIS — R51 Headache: Secondary | ICD-10-CM | POA: Diagnosis not present

## 2015-04-13 DIAGNOSIS — T1490XA Injury, unspecified, initial encounter: Secondary | ICD-10-CM

## 2015-04-13 DIAGNOSIS — S0990XA Unspecified injury of head, initial encounter: Secondary | ICD-10-CM | POA: Diagnosis not present

## 2015-04-13 DIAGNOSIS — M542 Cervicalgia: Secondary | ICD-10-CM | POA: Diagnosis not present

## 2015-04-13 DIAGNOSIS — Q394 Esophageal web: Secondary | ICD-10-CM | POA: Insufficient documentation

## 2015-04-13 DIAGNOSIS — Y998 Other external cause status: Secondary | ICD-10-CM | POA: Diagnosis not present

## 2015-04-13 DIAGNOSIS — S3992XA Unspecified injury of lower back, initial encounter: Secondary | ICD-10-CM | POA: Diagnosis not present

## 2015-04-13 DIAGNOSIS — I10 Essential (primary) hypertension: Secondary | ICD-10-CM | POA: Insufficient documentation

## 2015-04-13 DIAGNOSIS — S199XXA Unspecified injury of neck, initial encounter: Secondary | ICD-10-CM | POA: Diagnosis not present

## 2015-04-13 DIAGNOSIS — M546 Pain in thoracic spine: Secondary | ICD-10-CM | POA: Diagnosis not present

## 2015-04-13 DIAGNOSIS — Z79899 Other long term (current) drug therapy: Secondary | ICD-10-CM | POA: Insufficient documentation

## 2015-04-13 DIAGNOSIS — F329 Major depressive disorder, single episode, unspecified: Secondary | ICD-10-CM | POA: Insufficient documentation

## 2015-04-13 MED ORDER — IBUPROFEN 400 MG PO TABS: 400.0000 mg | ORAL_TABLET | Freq: Four times a day (QID) | ORAL | Status: DC | PRN

## 2015-04-13 NOTE — Discharge Instructions (Signed)
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
## 2015-04-13 NOTE — Telephone Encounter (Signed)
Left message for pt to call back  °
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
## 2015-04-14 DIAGNOSIS — E119 Type 2 diabetes mellitus without complications: Secondary | ICD-10-CM | POA: Diagnosis not present

## 2015-04-14 DIAGNOSIS — E780 Pure hypercholesterolemia: Secondary | ICD-10-CM | POA: Diagnosis not present

## 2015-04-14 DIAGNOSIS — F329 Major depressive disorder, single episode, unspecified: Secondary | ICD-10-CM | POA: Diagnosis not present

## 2015-04-17 DIAGNOSIS — S0500XA Injury of conjunctiva and corneal abrasion without foreign body, unspecified eye, initial encounter: Secondary | ICD-10-CM | POA: Diagnosis not present

## 2015-04-17 DIAGNOSIS — Z6831 Body mass index (BMI) 31.0-31.9, adult: Secondary | ICD-10-CM | POA: Diagnosis not present

## 2015-04-17 DIAGNOSIS — S0093XA Contusion of unspecified part of head, initial encounter: Secondary | ICD-10-CM | POA: Diagnosis not present

## 2015-04-17 DIAGNOSIS — M62838 Other muscle spasm: Secondary | ICD-10-CM | POA: Diagnosis not present

## 2015-04-18 NOTE — Telephone Encounter (Signed)
Left message for pt to call back  °

## 2015-04-19 NOTE — Telephone Encounter (Signed)
Pt states she has had to stop the omeprazole. States that since she started taking it she has not been able to eat dairy products at all. States she has been having terrible gas. Pt wants to know what else she can try, please advise.

## 2015-04-21 MED ORDER — RABEPRAZOLE SODIUM 20 MG PO TBEC: 20.0000 mg | DELAYED_RELEASE_TABLET | Freq: Every day | ORAL | Status: DC

## 2015-04-21 NOTE — Telephone Encounter (Signed)
Pantoprazole previously not covered Can try aciphex 20 mg daily

## 2015-04-21 NOTE — Telephone Encounter (Signed)
Spoke with pt and she is aware, script sent to pharmacy. 

## 2015-04-26 DIAGNOSIS — M7551 Bursitis of right shoulder: Secondary | ICD-10-CM | POA: Diagnosis not present

## 2015-04-26 DIAGNOSIS — M7581 Other shoulder lesions, right shoulder: Secondary | ICD-10-CM | POA: Diagnosis not present

## 2015-04-26 DIAGNOSIS — S4991XA Unspecified injury of right shoulder and upper arm, initial encounter: Secondary | ICD-10-CM | POA: Diagnosis not present

## 2015-04-26 DIAGNOSIS — M19011 Primary osteoarthritis, right shoulder: Secondary | ICD-10-CM | POA: Diagnosis not present

## 2015-04-28 DIAGNOSIS — M25519 Pain in unspecified shoulder: Secondary | ICD-10-CM | POA: Diagnosis not present

## 2015-04-28 DIAGNOSIS — Z6831 Body mass index (BMI) 31.0-31.9, adult: Secondary | ICD-10-CM | POA: Diagnosis not present

## 2015-04-28 DIAGNOSIS — S0093XA Contusion of unspecified part of head, initial encounter: Secondary | ICD-10-CM | POA: Diagnosis not present

## 2015-05-11 DIAGNOSIS — E7800 Pure hypercholesterolemia, unspecified: Secondary | ICD-10-CM | POA: Diagnosis not present

## 2015-05-11 DIAGNOSIS — F329 Major depressive disorder, single episode, unspecified: Secondary | ICD-10-CM | POA: Diagnosis not present

## 2015-05-11 DIAGNOSIS — E119 Type 2 diabetes mellitus without complications: Secondary | ICD-10-CM | POA: Diagnosis not present

## 2015-05-12 DIAGNOSIS — M25511 Pain in right shoulder: Secondary | ICD-10-CM | POA: Diagnosis not present

## 2015-05-16 DIAGNOSIS — M47816 Spondylosis without myelopathy or radiculopathy, lumbar region: Secondary | ICD-10-CM | POA: Diagnosis not present

## 2015-05-16 DIAGNOSIS — G8929 Other chronic pain: Secondary | ICD-10-CM | POA: Diagnosis not present

## 2015-05-16 DIAGNOSIS — M1731 Unilateral post-traumatic osteoarthritis, right knee: Secondary | ICD-10-CM | POA: Diagnosis not present

## 2015-05-17 ENCOUNTER — Telehealth: Payer: Self-pay | Admitting: Internal Medicine

## 2015-05-17 DIAGNOSIS — M25511 Pain in right shoulder: Secondary | ICD-10-CM | POA: Diagnosis not present

## 2015-05-17 DIAGNOSIS — M222X9 Patellofemoral disorders, unspecified knee: Secondary | ICD-10-CM | POA: Diagnosis not present

## 2015-05-17 NOTE — Telephone Encounter (Signed)
Left message for patient to call back  

## 2015-05-18 MED ORDER — OMEPRAZOLE 20 MG PO CPDR: 20.0000 mg | DELAYED_RELEASE_CAPSULE | Freq: Two times a day (BID) | ORAL | Status: DC

## 2015-05-18 NOTE — Telephone Encounter (Signed)
Patient states that she was to try Aciphex recently which she did and it did not help her symptoms. She originally called our office to tell us that she could not take omeprazole due to not being able to eat dairy products while on the medication and due to excessive gas. Patient states that she has since tried to take omeprazole 20 mg 1 tablet twice daily rather than 2 capsules at once and it seems to help her symptoms more. She would like rx for omeprazole twice daily sent to pharmacy. Rx for omeprazole bid has been sent to pharmacy.

## 2015-05-22 ENCOUNTER — Ambulatory Visit (HOSPITAL_COMMUNITY)
Admission: RE | Admit: 2015-05-22 | Discharge: 2015-05-22 | Disposition: A | Discharge status: Home / Self Care | Payer: Medicare Other | Source: Ambulatory Visit | Attending: Specialist | Admitting: Specialist

## 2015-05-22 ENCOUNTER — Other Ambulatory Visit (HOSPITAL_COMMUNITY): Payer: Self-pay | Admitting: Specialist

## 2015-05-22 DIAGNOSIS — M25561 Pain in right knee: Secondary | ICD-10-CM | POA: Diagnosis not present

## 2015-05-22 DIAGNOSIS — M179 Osteoarthritis of knee, unspecified: Secondary | ICD-10-CM | POA: Insufficient documentation

## 2015-05-22 DIAGNOSIS — R52 Pain, unspecified: Secondary | ICD-10-CM

## 2015-05-24 DIAGNOSIS — M222X9 Patellofemoral disorders, unspecified knee: Secondary | ICD-10-CM | POA: Diagnosis not present

## 2015-05-31 ENCOUNTER — Ambulatory Visit: Payer: Medicare Other | Attending: Family Medicine | Admitting: Physical Therapy

## 2015-06-07 DIAGNOSIS — F330 Major depressive disorder, recurrent, mild: Secondary | ICD-10-CM | POA: Diagnosis not present

## 2015-06-08 DIAGNOSIS — F319 Bipolar disorder, unspecified: Secondary | ICD-10-CM | POA: Diagnosis not present

## 2015-06-12 DIAGNOSIS — M5136 Other intervertebral disc degeneration, lumbar region: Secondary | ICD-10-CM | POA: Diagnosis not present

## 2015-06-12 DIAGNOSIS — Z79891 Long term (current) use of opiate analgesic: Secondary | ICD-10-CM | POA: Diagnosis not present

## 2015-06-12 DIAGNOSIS — G8929 Other chronic pain: Secondary | ICD-10-CM | POA: Diagnosis not present

## 2015-06-12 DIAGNOSIS — M47816 Spondylosis without myelopathy or radiculopathy, lumbar region: Secondary | ICD-10-CM | POA: Diagnosis not present

## 2015-06-22 DIAGNOSIS — F330 Major depressive disorder, recurrent, mild: Secondary | ICD-10-CM | POA: Diagnosis not present

## 2015-07-05 DIAGNOSIS — F319 Bipolar disorder, unspecified: Secondary | ICD-10-CM | POA: Diagnosis not present

## 2015-07-06 DIAGNOSIS — M62838 Other muscle spasm: Secondary | ICD-10-CM | POA: Diagnosis not present

## 2015-07-28 DIAGNOSIS — M47816 Spondylosis without myelopathy or radiculopathy, lumbar region: Secondary | ICD-10-CM | POA: Diagnosis not present

## 2015-07-28 DIAGNOSIS — M4723 Other spondylosis with radiculopathy, cervicothoracic region: Secondary | ICD-10-CM | POA: Diagnosis not present

## 2015-07-28 DIAGNOSIS — M5136 Other intervertebral disc degeneration, lumbar region: Secondary | ICD-10-CM | POA: Diagnosis not present

## 2015-07-28 DIAGNOSIS — Z79891 Long term (current) use of opiate analgesic: Secondary | ICD-10-CM | POA: Diagnosis not present

## 2015-07-28 DIAGNOSIS — G8929 Other chronic pain: Secondary | ICD-10-CM | POA: Diagnosis not present

## 2015-07-28 MED FILL — BUPROPION HCL XL 150 MG TAB: 150 | 30 days supply | Qty: 30 | Fill #1 | Status: TO

## 2015-07-28 MED FILL — LISINOPRIL 10 MG TABLET: 10 | 30 days supply | Qty: 30 | Fill #3

## 2015-08-10 MED FILL — lamoTRIgine 200 MG TABS: 200 | 30 days supply | Qty: 30 | Fill #2

## 2015-08-10 MED FILL — CYCLOBENZAPRINE 10 MG TAB: 10 | 30 days supply | Qty: 30 | Fill #1 | Status: TO

## 2015-08-10 MED FILL — BUPROPION HCL XL 300 MG TAB: 300 | 30 days supply | Qty: 30 | Fill #1

## 2015-08-10 MED FILL — traZODone HCL 50 MG TABS: 50 | 30 days supply | Qty: 30 | Fill #2

## 2015-08-10 MED FILL — OMEPRAZOLE DR 20 MG CAPSULE: 20 | 30 days supply | Qty: 60 | Fill #2

## 2015-08-11 DIAGNOSIS — M25572 Pain in left ankle and joints of left foot: Secondary | ICD-10-CM | POA: Diagnosis not present

## 2015-08-11 DIAGNOSIS — M25511 Pain in right shoulder: Secondary | ICD-10-CM | POA: Diagnosis not present

## 2015-08-17 DIAGNOSIS — M542 Cervicalgia: Secondary | ICD-10-CM | POA: Diagnosis not present

## 2015-08-17 DIAGNOSIS — S161XXD Strain of muscle, fascia and tendon at neck level, subsequent encounter: Secondary | ICD-10-CM | POA: Diagnosis not present

## 2015-08-17 DIAGNOSIS — M25511 Pain in right shoulder: Secondary | ICD-10-CM | POA: Diagnosis not present

## 2015-08-22 DIAGNOSIS — S161XXD Strain of muscle, fascia and tendon at neck level, subsequent encounter: Secondary | ICD-10-CM | POA: Diagnosis not present

## 2015-08-22 DIAGNOSIS — M25511 Pain in right shoulder: Secondary | ICD-10-CM | POA: Diagnosis not present

## 2015-08-22 DIAGNOSIS — M542 Cervicalgia: Secondary | ICD-10-CM | POA: Diagnosis not present

## 2015-08-24 DIAGNOSIS — M542 Cervicalgia: Secondary | ICD-10-CM | POA: Diagnosis not present

## 2015-08-24 DIAGNOSIS — S161XXD Strain of muscle, fascia and tendon at neck level, subsequent encounter: Secondary | ICD-10-CM | POA: Diagnosis not present

## 2015-08-24 DIAGNOSIS — M25511 Pain in right shoulder: Secondary | ICD-10-CM | POA: Diagnosis not present

## 2015-08-31 DIAGNOSIS — F319 Bipolar disorder, unspecified: Secondary | ICD-10-CM | POA: Diagnosis not present

## 2015-08-31 MED FILL — LISINOPRIL 10 MG TABLET: 10 | 30 days supply | Qty: 30 | Fill #4

## 2015-09-01 MED FILL — DULoxetine HCL 60 MG CPEP: 60 | 30 days supply | Qty: 30 | Fill #0

## 2015-09-07 DIAGNOSIS — M25511 Pain in right shoulder: Secondary | ICD-10-CM | POA: Diagnosis not present

## 2015-09-07 DIAGNOSIS — M25572 Pain in left ankle and joints of left foot: Secondary | ICD-10-CM | POA: Diagnosis not present

## 2015-09-12 DIAGNOSIS — M25512 Pain in left shoulder: Secondary | ICD-10-CM | POA: Diagnosis not present

## 2015-09-12 DIAGNOSIS — M25532 Pain in left wrist: Secondary | ICD-10-CM | POA: Diagnosis not present

## 2015-09-12 DIAGNOSIS — M25531 Pain in right wrist: Secondary | ICD-10-CM | POA: Diagnosis not present

## 2015-09-14 DIAGNOSIS — M542 Cervicalgia: Secondary | ICD-10-CM | POA: Diagnosis not present

## 2015-09-14 DIAGNOSIS — M25511 Pain in right shoulder: Secondary | ICD-10-CM | POA: Diagnosis not present

## 2015-09-14 DIAGNOSIS — S161XXD Strain of muscle, fascia and tendon at neck level, subsequent encounter: Secondary | ICD-10-CM | POA: Diagnosis not present

## 2015-09-19 MED FILL — traZODone HCL 50 MG TABS: 50 | 30 days supply | Qty: 30 | Fill #0

## 2015-09-19 MED FILL — lamoTRIgine 200 MG TABS: 200 | 30 days supply | Qty: 30 | Fill #0

## 2015-09-19 MED FILL — JANUMET 50-500 MG TABLET: 50-500 | 30 days supply | Qty: 60 | Fill #0

## 2015-09-27 ENCOUNTER — Ambulatory Visit: Payer: Medicare Other | Attending: Family Medicine | Admitting: Family Medicine

## 2015-09-27 ENCOUNTER — Encounter: Payer: Self-pay | Admitting: Family Medicine

## 2015-09-27 VITALS — BP 136/83 | HR 80 | Temp 98.7°F | Resp 18 | Ht 59.0 in | Wt 143.0 lb

## 2015-09-27 DIAGNOSIS — N644 Mastodynia: Secondary | ICD-10-CM | POA: Diagnosis not present

## 2015-09-27 DIAGNOSIS — L83 Acanthosis nigricans: Secondary | ICD-10-CM | POA: Insufficient documentation

## 2015-09-27 DIAGNOSIS — Z0000 Encounter for general adult medical examination without abnormal findings: Secondary | ICD-10-CM

## 2015-09-27 MED ORDER — ACCU-CHEK AVIVA PLUS W/DEVICE KIT: 1.0000 | PACK | Freq: Once | Status: DC

## 2015-09-27 MED ORDER — ACCU-CHEK FASTCLIX LANCETS MISC: 1.0000 | Freq: Once | Status: DC

## 2015-09-27 MED ORDER — DULOXETINE HCL 60 MG PO CPEP: 60.0000 mg | ORAL_CAPSULE | Freq: Every day | ORAL | Status: DC

## 2015-09-27 MED ORDER — GLUCOSE BLOOD VI STRP: ORAL_STRIP | Status: DC

## 2015-09-27 MED FILL — LISINOPRIL 10 MG TABLET: 10 | 30 days supply | Qty: 30 | Fill #5

## 2015-09-27 NOTE — Progress Notes (Signed)
Patient is here for Breast Pain  Patient complains of breast tenderness on the right side for the past 3 weeks. Patient denies any discharge or knots.  Patient would like flu shot today. Patient tolerated flu shot well.

## 2015-09-27 NOTE — Progress Notes (Signed)
   Subjective:    Patient ID: Amy Glass, female    DOB: 03-05-1961, 55 y.o.   MRN: [id number]  HPI Patient is here for complaint of a pain in the R breast above the areolus.  Started about 3 weeks ago, notices it when she is not wearing bra.  Not present when wearing the bra.  No nipple discharge, no skin changes.  She has done SBE and has not felt any masses or lumps.  Last screening mammo was Jan 2016 and was unremarkable.   Has noticed that her CBGs at home go into the 90s in the mornings; she was symptomatic on occasion for what she interpreted as hypoglycemia.  Is taking Janumet irregularly out of concern for this. Has lost 10 lbs recently since coming off Percocet and starting duloxetine for chronic pain, managed by pain management and [facility].  She tries to eat healthily.   Noticed a sensitive area of skin on her chin when plucking hairs.  Uses a moisturizer cream on face.  Since noticing the sensitive skin, has used Bacitracin over the counter on the chin.   Family Hx; No family hx of breast cancer.    Review of Systems  No fevers or chills, no chest pain, no nipple discharge. No breast masses on SBE.  LMP 5 months ago.     Objective:   Physical Exam Well appearing, no distress HEENT Neck supple, no cervical adenopathy. Skin on chin with patch of raised, velvety hyperpigmented skin and some mild redness surrounding.  No similar lesions elsewhere (specifically on the nape of neck, postauricular, or neck line).  Moist mucus membranes.  BREAST: No skin changes or asymmetry. No palpable masses in either breast. No nipple discharge from either breast. No axillary adenopathy palpable bilaterally.        Assessment & Plan:  1. R breast pain: suspect related to not using bra.  To order diagnostic mammography at [facility]; recommend that she use bra whenever possible.    2. DM2: her A1C today is 5.7%; recommended that she hold the Janumet for the time being, as she has had  weight loss and diet that are helping to control her glucose. She reports GI intolerance of metformin alone, but does tolerate it when in [place].  To recheck A1C in 3 months and reassess at that time.   3. Skin changes c/w acanthosis nigricans, albeit in unusual location. Also with some skin irritation that may be from hair plucking.  To avoid creams or lotions, including the bacitracin.  Avoid hair plucking. Follow up at next visit.   Dalbert Mayotte, MD

## 2015-09-27 NOTE — Patient Instructions (Addendum)
It is a pleasure to see you today. Your hemoglobin A1C is 5.7%, which is extremely good.  I would like to ask you to hold the Janumet for the time being and continue to watch your diet and maintain an active lifestyle.   I am ordering a diagnostic mammography of the right breast to evaluate the breast pain you have been having.  I recommend that you wear a bra, even when around the house.   For the spots on the chin, I ask that you not use any facial creams or products for the coming several weeks, and follow up with Dr Adrian Blackwater in the coming month.

## 2015-09-29 ENCOUNTER — Other Ambulatory Visit: Payer: Self-pay | Admitting: Family Medicine

## 2015-09-29 DIAGNOSIS — N644 Mastodynia: Secondary | ICD-10-CM

## 2015-09-29 MED FILL — DULoxetine HCL 60 MG CPEP: 60 | 30 days supply | Qty: 30 | Fill #1

## 2015-10-03 ENCOUNTER — Telehealth: Payer: Self-pay | Admitting: Family Medicine

## 2015-10-03 ENCOUNTER — Telehealth: Payer: Self-pay | Admitting: Internal Medicine

## 2015-10-03 NOTE — Telephone Encounter (Signed)
Orders are in See chart Placed by Dr. Lindell Noe Please schedule

## 2015-10-03 NOTE — Telephone Encounter (Signed)
Pt states she is having problems swallowing again. States this started about 2 months ago. Pt reports she can swallow liquids but no solid food. Pt states she is putting her food in the blender. Pt has OV scheduled in April. Do you want pt seen for an OV or could she be a direct EGD with dil? Please advise.

## 2015-10-03 NOTE — Telephone Encounter (Signed)
Pt. Called stating that she had orders to get a mammogram done. The order was put in and she called the facility and was told that they had no orders for her to get a mammogram done. Please f/u with pt.

## 2015-10-04 ENCOUNTER — Other Ambulatory Visit: Payer: Self-pay

## 2015-10-04 DIAGNOSIS — R1310 Dysphagia, unspecified: Secondary | ICD-10-CM

## 2015-10-04 NOTE — Telephone Encounter (Signed)
Pt has appointment schedule on 10/06/2015 Pt aware

## 2015-10-04 NOTE — Telephone Encounter (Signed)
Please ensure that she continues Nexium 40 mg daily given her history of reflux, hiatal hernia and history of esophagitis Prior to office visit with me I recommend barium swallow with tablet to evaluate motility

## 2015-10-04 NOTE — Telephone Encounter (Signed)
Pt scheduled for barium swallow at [facility] 10/17/15@11 :30am. Pt to arrive there at 11:15am and be NPO after midnight. Pt aware. Pt to keep scheduled OV with Dr. Hilarie Fredrickson.

## 2015-10-05 ENCOUNTER — Other Ambulatory Visit: Payer: Self-pay | Admitting: Family Medicine

## 2015-10-05 ENCOUNTER — Other Ambulatory Visit: Payer: Self-pay

## 2015-10-05 ENCOUNTER — Other Ambulatory Visit: Payer: Medicare Other

## 2015-10-05 DIAGNOSIS — N644 Mastodynia: Secondary | ICD-10-CM

## 2015-10-06 ENCOUNTER — Other Ambulatory Visit: Payer: Self-pay | Admitting: *Deleted

## 2015-10-06 ENCOUNTER — Ambulatory Visit
Admission: RE | Admit: 2015-10-06 | Discharge: 2015-10-06 | Disposition: A | Discharge status: Home / Self Care | Payer: Medicare Other | Source: Ambulatory Visit | Attending: Family Medicine | Admitting: Family Medicine

## 2015-10-06 ENCOUNTER — Telehealth: Payer: Self-pay | Admitting: Family Medicine

## 2015-10-06 DIAGNOSIS — N644 Mastodynia: Secondary | ICD-10-CM

## 2015-10-06 DIAGNOSIS — M25512 Pain in left shoulder: Secondary | ICD-10-CM | POA: Diagnosis not present

## 2015-10-06 DIAGNOSIS — M25511 Pain in right shoulder: Secondary | ICD-10-CM | POA: Diagnosis not present

## 2015-10-06 MED ORDER — ACCU-CHEK FASTCLIX LANCETS MISC
1.0000 | Freq: Once | Status: DC
Start: 2015-10-06 — End: 2015-10-09

## 2015-10-06 MED ORDER — ACCU-CHEK AVIVA PLUS W/DEVICE KIT: 1.0000 | PACK | Freq: Once | Status: DC

## 2015-10-06 MED ORDER — GLUCOSE BLOOD VI STRP: ORAL_STRIP | Status: DC

## 2015-10-06 NOTE — Telephone Encounter (Signed)
Patient came in stating that the DM meter that was prescribed to her is not used at out pharmacy. Patient is requesting a paper prescription so she can take it to another pharmacy. Please follow up.

## 2015-10-09 MED ORDER — ACCU-CHEK AVIVA PLUS W/DEVICE KIT: 1.0000 | PACK | Freq: Once | Status: DC

## 2015-10-09 MED ORDER — ACCU-CHEK FASTCLIX LANCETS MISC: 1.0000 | Freq: Once | Status: DC

## 2015-10-09 MED ORDER — GLUCOSE BLOOD VI STRP: ORAL_STRIP | Status: DC

## 2015-10-09 NOTE — Telephone Encounter (Signed)
Rx res[facility] to [facility] Pt notified

## 2015-10-17 ENCOUNTER — Telehealth: Payer: Self-pay | Admitting: Internal Medicine

## 2015-10-17 ENCOUNTER — Ambulatory Visit (HOSPITAL_COMMUNITY)
Admission: RE | Admit: 2015-10-17 | Discharge: 2015-10-17 | Disposition: A | Discharge status: Home / Self Care | Payer: Medicare Other | Source: Ambulatory Visit | Attending: Internal Medicine | Admitting: Internal Medicine

## 2015-10-17 DIAGNOSIS — R1310 Dysphagia, unspecified: Secondary | ICD-10-CM | POA: Insufficient documentation

## 2015-10-17 DIAGNOSIS — M25512 Pain in left shoulder: Secondary | ICD-10-CM | POA: Diagnosis not present

## 2015-10-17 DIAGNOSIS — K228 Other specified diseases of esophagus: Secondary | ICD-10-CM | POA: Diagnosis not present

## 2015-10-17 NOTE — Telephone Encounter (Signed)
Pt had barium swallow done today and states she is frustrated. She states when she was eating regular food she was having problems swallowing. States that her throat was very irritated and inflamed when she was eating regular food. Pt states she has been on a soft diet with pureed food for quite sometime now. Today she did not have any issues swallowing and was told the barium swallow was good at radiology. Pt states she cannot continue to eat pureed food all the time, wants something else done. Please advise.

## 2015-10-17 NOTE — Telephone Encounter (Signed)
Barium swallow unremarkable Barium tablet passes easily Given persistent symptoms would recommend proceeding with high resolution esophageal manometry If this is unrevealing she may need to see tertiary care esophageal specialist at [facility]

## 2015-10-18 NOTE — Telephone Encounter (Signed)
Pt aware and scheduled for EM at [facility] 10/25/15@8 :30am, pt to arrive at 8am. Prep instructions mailed to pt and she is aware of appt.

## 2015-10-19 DIAGNOSIS — M25511 Pain in right shoulder: Secondary | ICD-10-CM | POA: Diagnosis not present

## 2015-10-24 DIAGNOSIS — M24812 Other specific joint derangements of left shoulder, not elsewhere classified: Secondary | ICD-10-CM | POA: Diagnosis not present

## 2015-10-24 DIAGNOSIS — M24811 Other specific joint derangements of right shoulder, not elsewhere classified: Secondary | ICD-10-CM | POA: Diagnosis not present

## 2015-10-24 DIAGNOSIS — M25512 Pain in left shoulder: Secondary | ICD-10-CM | POA: Diagnosis not present

## 2015-10-24 DIAGNOSIS — M25511 Pain in right shoulder: Secondary | ICD-10-CM | POA: Diagnosis not present

## 2015-10-24 MED FILL — lamoTRIgine 200 MG TABS: 200 | 30 days supply | Qty: 30 | Fill #1

## 2015-10-24 MED FILL — DULoxetine HCL 60 MG CPEP: 60 | 30 days supply | Qty: 30 | Fill #2

## 2015-10-24 MED FILL — LISINOPRIL 10 MG TABLET: 10 | 30 days supply | Qty: 30 | Fill #6

## 2015-10-24 MED FILL — traZODone HCL 50 MG TABS: 50 | 30 days supply | Qty: 30 | Fill #1

## 2015-10-25 ENCOUNTER — Ambulatory Visit (HOSPITAL_COMMUNITY)
Admission: RE | Admit: 2015-10-25 | Discharge: 2015-10-25 | Disposition: A | Discharge status: Home / Self Care | Payer: Medicare Other | Source: Ambulatory Visit | Attending: Internal Medicine | Admitting: Internal Medicine

## 2015-10-25 ENCOUNTER — Encounter (HOSPITAL_COMMUNITY)
Admission: RE | Disposition: A | Discharge status: Home / Self Care | Payer: Self-pay | Source: Ambulatory Visit | Attending: Internal Medicine

## 2015-10-25 DIAGNOSIS — R1310 Dysphagia, unspecified: Secondary | ICD-10-CM | POA: Insufficient documentation

## 2015-10-25 HISTORY — PX: ESOPHAGEAL MANOMETRY: SHX5429

## 2015-10-25 SURGERY — MANOMETRY, ESOPHAGUS
Anesthesia: LOCAL

## 2015-10-25 MED ORDER — LIDOCAINE VISCOUS 2 % MT SOLN
OROMUCOSAL | Status: AC
  Filled 2015-10-25: qty 15

## 2015-10-25 SURGICAL SUPPLY — 2 items
FACESHIELD LNG OPTICON STERILE (SAFETY) IMPLANT
GLOVE BIO SURGEON STRL SZ8 (GLOVE) ×4 IMPLANT

## 2015-10-25 NOTE — Progress Notes (Signed)
Esophageal Manometry done per protocol. Pt tolerated well with not complications. Report to be sent to Dr. Hilarie Fredrickson.

## 2015-10-26 ENCOUNTER — Encounter (HOSPITAL_COMMUNITY): Payer: Self-pay | Admitting: Internal Medicine

## 2015-11-09 DIAGNOSIS — M24811 Other specific joint derangements of right shoulder, not elsewhere classified: Secondary | ICD-10-CM | POA: Diagnosis not present

## 2015-11-10 ENCOUNTER — Encounter: Payer: Medicare Other | Admitting: Family Medicine

## 2015-11-14 ENCOUNTER — Telehealth: Payer: Self-pay

## 2015-11-14 ENCOUNTER — Encounter: Payer: Self-pay | Admitting: *Deleted

## 2015-11-14 NOTE — Telephone Encounter (Signed)
Results letter mailed to pt

## 2015-11-14 NOTE — Telephone Encounter (Signed)
-----   Message from Jerene Bears, MD sent at 11/10/2015 12:45 PM EDT ----- Regarding: [place] Donovan done for dysphagia. Please make pt aware of results.  Impressions  1. Normal high resolution esophageal manometry with intact peristalsis and normal LES relaxation 2. Normal esophagal impedence (10 swallows only) 3. No esophageal cause found to explain dysphagia symptoms.  Patient could be referred for second opinion in the Esophageal Disorders Clinic at [facility] if symptoms persist and she is interested.    Dr. Zenovia Jarred, M.D.

## 2015-11-15 DIAGNOSIS — M67911 Unspecified disorder of synovium and tendon, right shoulder: Secondary | ICD-10-CM | POA: Diagnosis not present

## 2015-11-20 HISTORY — PX: SHOULDER SURGERY: SHX246

## 2015-11-24 MED FILL — traZODone HCL 50 MG TABS: 50 | 30 days supply | Qty: 30 | Fill #2

## 2015-11-24 MED FILL — **JANUMET 50-500 MG TABLET: 50-500 | 30 days supply | Qty: 60 | Fill #1

## 2015-11-24 MED FILL — LISINOPRIL 10 MG TABLET: 10 | 30 days supply | Qty: 30 | Fill #7

## 2015-11-24 MED FILL — !LAMICTAL 200MG TABLET: 200 | 30 days supply | Qty: 30 | Fill #2

## 2015-11-28 DIAGNOSIS — G8918 Other acute postprocedural pain: Secondary | ICD-10-CM | POA: Diagnosis not present

## 2015-11-28 DIAGNOSIS — Y939 Activity, unspecified: Secondary | ICD-10-CM | POA: Diagnosis not present

## 2015-11-28 DIAGNOSIS — M19011 Primary osteoarthritis, right shoulder: Secondary | ICD-10-CM | POA: Diagnosis not present

## 2015-11-28 DIAGNOSIS — Y999 Unspecified external cause status: Secondary | ICD-10-CM | POA: Diagnosis not present

## 2015-11-28 DIAGNOSIS — X58XXXA Exposure to other specified factors, initial encounter: Secondary | ICD-10-CM | POA: Diagnosis not present

## 2015-11-28 DIAGNOSIS — S43431A Superior glenoid labrum lesion of right shoulder, initial encounter: Secondary | ICD-10-CM | POA: Diagnosis not present

## 2015-11-28 DIAGNOSIS — Y929 Unspecified place or not applicable: Secondary | ICD-10-CM | POA: Diagnosis not present

## 2015-11-28 DIAGNOSIS — M7541 Impingement syndrome of right shoulder: Secondary | ICD-10-CM | POA: Diagnosis not present

## 2015-11-28 DIAGNOSIS — M75111 Incomplete rotator cuff tear or rupture of right shoulder, not specified as traumatic: Secondary | ICD-10-CM | POA: Diagnosis not present

## 2015-11-28 DIAGNOSIS — S46011A Strain of muscle(s) and tendon(s) of the rotator cuff of right shoulder, initial encounter: Secondary | ICD-10-CM | POA: Diagnosis not present

## 2015-12-05 ENCOUNTER — Ambulatory Visit: Payer: Medicare Other | Admitting: Internal Medicine

## 2015-12-06 DIAGNOSIS — M19011 Primary osteoarthritis, right shoulder: Secondary | ICD-10-CM | POA: Diagnosis not present

## 2015-12-07 ENCOUNTER — Telehealth: Payer: Self-pay | Admitting: *Deleted

## 2015-12-07 NOTE — Telephone Encounter (Signed)
No show letter sent.

## 2015-12-08 ENCOUNTER — Telehealth: Payer: Self-pay | Admitting: Family Medicine

## 2015-12-08 NOTE — Telephone Encounter (Signed)
Pt. Called stating that she had surgery done on her shoulder and has a sleeve. Pt. Stated that she has a rash and it's b/c of the sleeve. She would like to come in and see her PCP, and was told that her PCP did not have any appointments available.  Pt. Was advised to go to urgent care and she stated that she had to pay a co-pay. She said she was going to take her  Chances and come to the facility see if she could be seen. Please f/u

## 2015-12-13 DIAGNOSIS — M25511 Pain in right shoulder: Secondary | ICD-10-CM | POA: Diagnosis not present

## 2015-12-13 DIAGNOSIS — M24811 Other specific joint derangements of right shoulder, not elsewhere classified: Secondary | ICD-10-CM | POA: Diagnosis not present

## 2015-12-15 DIAGNOSIS — M25511 Pain in right shoulder: Secondary | ICD-10-CM | POA: Diagnosis not present

## 2015-12-15 DIAGNOSIS — M24811 Other specific joint derangements of right shoulder, not elsewhere classified: Secondary | ICD-10-CM | POA: Diagnosis not present

## 2015-12-20 DIAGNOSIS — M25511 Pain in right shoulder: Secondary | ICD-10-CM | POA: Diagnosis not present

## 2015-12-20 DIAGNOSIS — M24811 Other specific joint derangements of right shoulder, not elsewhere classified: Secondary | ICD-10-CM | POA: Diagnosis not present

## 2015-12-20 MED FILL — OMEPRAZOLE DR 20 MG CAPSULE: 20 | 30 days supply | Qty: 60 | Fill #3

## 2015-12-21 ENCOUNTER — Other Ambulatory Visit (HOSPITAL_COMMUNITY)
Admission: RE | Admit: 2015-12-21 | Discharge: 2015-12-21 | Disposition: A | Discharge status: Home / Self Care | Payer: Medicare Other | Source: Ambulatory Visit | Attending: Family Medicine | Admitting: Family Medicine

## 2015-12-21 ENCOUNTER — Ambulatory Visit: Payer: Medicare Other | Attending: Family Medicine | Admitting: Family Medicine

## 2015-12-21 ENCOUNTER — Encounter: Payer: Self-pay | Admitting: Family Medicine

## 2015-12-21 VITALS — BP 127/79 | HR 105 | Temp 98.6°F | Resp 16 | Ht 59.0 in | Wt 151.0 lb

## 2015-12-21 DIAGNOSIS — E781 Pure hyperglyceridemia: Secondary | ICD-10-CM | POA: Diagnosis not present

## 2015-12-21 DIAGNOSIS — H6121 Impacted cerumen, right ear: Secondary | ICD-10-CM | POA: Insufficient documentation

## 2015-12-21 DIAGNOSIS — E119 Type 2 diabetes mellitus without complications: Secondary | ICD-10-CM | POA: Insufficient documentation

## 2015-12-21 DIAGNOSIS — Z7984 Long term (current) use of oral hypoglycemic drugs: Secondary | ICD-10-CM | POA: Diagnosis not present

## 2015-12-21 DIAGNOSIS — Z319 Encounter for procreative management, unspecified: Secondary | ICD-10-CM

## 2015-12-21 DIAGNOSIS — Z01419 Encounter for gynecological examination (general) (routine) without abnormal findings: Secondary | ICD-10-CM | POA: Diagnosis not present

## 2015-12-21 DIAGNOSIS — Z113 Encounter for screening for infections with a predominantly sexual mode of transmission: Secondary | ICD-10-CM | POA: Insufficient documentation

## 2015-12-21 DIAGNOSIS — J309 Allergic rhinitis, unspecified: Secondary | ICD-10-CM | POA: Diagnosis not present

## 2015-12-21 DIAGNOSIS — Z124 Encounter for screening for malignant neoplasm of cervix: Secondary | ICD-10-CM | POA: Diagnosis not present

## 2015-12-21 DIAGNOSIS — M25511 Pain in right shoulder: Secondary | ICD-10-CM | POA: Diagnosis not present

## 2015-12-21 DIAGNOSIS — Z0001 Encounter for general adult medical examination with abnormal findings: Secondary | ICD-10-CM | POA: Diagnosis not present

## 2015-12-21 DIAGNOSIS — Z79899 Other long term (current) drug therapy: Secondary | ICD-10-CM | POA: Insufficient documentation

## 2015-12-21 DIAGNOSIS — Z1159 Encounter for screening for other viral diseases: Secondary | ICD-10-CM | POA: Diagnosis not present

## 2015-12-21 DIAGNOSIS — N760 Acute vaginitis: Secondary | ICD-10-CM | POA: Insufficient documentation

## 2015-12-21 DIAGNOSIS — Z1151 Encounter for screening for human papillomavirus (HPV): Secondary | ICD-10-CM | POA: Diagnosis not present

## 2015-12-21 DIAGNOSIS — Z9889 Other specified postprocedural states: Secondary | ICD-10-CM | POA: Diagnosis not present

## 2015-12-21 DIAGNOSIS — K0889 Other specified disorders of teeth and supporting structures: Secondary | ICD-10-CM | POA: Insufficient documentation

## 2015-12-21 LAB — HEMOGLOBIN A1C
HEMOGLOBIN A1C: 6.5 % — AB (ref <5.7)
MEAN PLASMA GLUCOSE: 140 mg/dL

## 2015-12-21 LAB — FSH/LH
FSH: 11.9 m[IU]/mL
LH: 12.8 m[IU]/mL

## 2015-12-21 MED ORDER — TRAMADOL-ACETAMINOPHEN 37.5-325 MG PO TABS
1.0000 | ORAL_TABLET | Freq: Four times a day (QID) | ORAL | Status: DC | PRN
Start: 2015-12-21 — End: 2016-02-08

## 2015-12-21 MED ORDER — CARBAMIDE PEROXIDE 6.5 % OT SOLN: 5.0000 [drp] | Freq: Two times a day (BID) | OTIC | Status: DC

## 2015-12-21 MED ORDER — CETIRIZINE HCL 10 MG PO TABS: 10.0000 mg | ORAL_TABLET | Freq: Every day | ORAL | Status: DC

## 2015-12-21 MED ORDER — FLUTICASONE PROPIONATE 50 MCG/ACT NA SUSP: 2.0000 | Freq: Every day | NASAL | Status: DC

## 2015-12-21 MED FILL — JANUMET 50-500 MG TABLET: 50-500 | 30 days supply | Qty: 60 | Fill #2

## 2015-12-21 MED FILL — LISINOPRIL 10 MG TABLET: 10 | 30 days supply | Qty: 30 | Fill #8

## 2015-12-21 NOTE — Progress Notes (Signed)
SUBJECTIVE:  55 y.o. female for annual routine Pap and checkup. She recently had R shoulder arthroscopy. She is having a rash from her sling. She is having pain in her R shoulder and has not tolerated Vicodin due to nausea.  She has tolerated ultracet well in the past. She would like to have another child.  Past Surgical History  Procedure Laterality Date  . Shoulder surgery  08/2013     b/l shoulder   . Back surgery  2002    lumbar  . Cesarean section  05/20/2003   . Esophageal manometry N/A 10/25/2015    Procedure: ESOPHAGEAL MANOMETRY (EM);  Surgeon: Jerene Bears, MD;  Location: [facility];  Service: Gastroenterology;  Laterality: N/A;    Social History  Substance Use Topics  . Smoking status: Never Smoker   . Smokeless tobacco: Never Used  . Alcohol Use: No   Current Outpatient Prescriptions  Medication Sig Dispense Refill  . ACCU-CHEK FASTCLIX LANCETS MISC 1 each by Does not apply route once. Used TID before meal /code E11.9 102 each 12  . Blood Glucose Monitoring Suppl (ACCU-CHEK AVIVA PLUS) w/Device KIT 1 each by Does not apply route once. Used TID before meal /code E11.9 1 kit 0  . Cholecalciferol (VITAMIN D3) 2000 UNITS TABS Take 2,000 Units by mouth daily. (Patient not taking: Reported on 09/27/2015) 30 tablet 11  . dicyclomine (BENTYL) 20 MG tablet Take 1 tablet (20 mg total) by mouth 3 (three) times daily as needed for spasms (and cramping). 30 tablet 0  . DULoxetine (CYMBALTA) 60 MG capsule Take 1 capsule (60 mg total) by mouth daily.  3  . glucose blood (ACCU-CHEK AVIVA PLUS) test strip Use as instructed TID before meal Code E.11.9 100 each 12  . ibuprofen (ADVIL,MOTRIN) 400 MG tablet Take 1 tablet (400 mg total) by mouth every 6 (six) hours as needed. 30 tablet 0  . lisinopril (PRINIVIL,ZESTRIL) 10 MG tablet Take 1 tablet (10 mg total) by mouth daily. 30 tablet 11  . omeprazole (PRILOSEC) 20 MG capsule Take 1 capsule (20 mg total) by mouth 2 (two) times daily. 60 capsule 3   . sitaGLIPtin-metformin (JANUMET) 50-500 MG per tablet Take 1 tablet by mouth 2 (two) times daily with a meal. 60 tablet 5   No current facility-administered medications for this visit.   Allergies: Nitroglycerin  No LMP recorded. Patient is not currently having periods (Reason: Perimenopausal).  ROS:  Feeling well. No dyspnea or chest pain on exertion.  No abdominal pain, change in bowel habits, black or bloody stools.  No urinary tract symptoms. GYN ROS: normal menses, no abnormal bleeding, pelvic pain or discharge, no breast pain or new or enlarging lumps on self exam. No neurological complaints. MSK R shoulder pain following recent scope.   OBJECTIVE:  The patient appears well, alert, oriented x 3, in no distress. BP 127/79 mmHg  Pulse 105  Temp(Src) 98.6 F (37 C) (Oral)  Resp 16  Ht '4\' 11"'  (1.499 m)  Wt 151 lb (68.493 kg)  BMI 30.48 kg/m2  SpO2 97% ENT normal poor dentition.  Cerumen in R ear.  Tenderness in frontal sinuses.  Neck supple. No adenopathy or thyromegaly. PERLA. Lungs are clear, good air entry, no wheezes, rhonchi or rales. S1 and S2 normal, no murmurs, regular rate and rhythm. Abdomen soft without tenderness, guarding, mass or organomegaly. Extremities show no edema, normal peripheral pulses. Neurological is normal, no focal findings. MSK healed scope wounds in R shoulder. Decreased ROM of  shoulder. SKIN mild erythema R arm   BREAST EXAM: breasts appear normal, no suspicious masses, no skin or nipple changes or axillary nodes  PELVIC EXAM: normal external genitalia, vulva, vagina, cervix, uterus and adnexa  ASSESSMENT:  well woman  PLAN:  pap smear  Diagnoses and all orders for this visit:  Pap smear for cervical cancer screening -     Cytology - PAP  Right shoulder pain -     traMADol-acetaminophen (ULTRACET) 37.5-325 MG tablet; Take 1 tablet by mouth every 6 (six) hours as needed.  Pain, dental -     Ambulatory referral to Dentistry  Need for  hepatitis C screening test -     Hepatitis C antibody, reflex  Allergic sinusitis -     fluticasone (FLONASE) 50 MCG/ACT nasal spray; Place 2 sprays into both nostrils daily. -     cetirizine (ZYRTEC) 10 MG tablet; Take 1 tablet (10 mg total) by mouth daily.  Cerumen impaction, right -     carbamide peroxide (DEBROX) 6.5 % otic solution; Place 5 drops into the right ear 2 (two) times daily.  Controlled type 2 diabetes mellitus without complication, without long-term current use of insulin (HCC) -     Hemoglobin A1c -     COMPLETE METABOLIC PANEL WITH GFR  High triglycerides -     Lipid Panel  Patient desires pregnancy -     FSH/LH -     Ambulatory referral to Obstetrics / Gynecology  Other orders -     Hepatitis C antibody

## 2015-12-21 NOTE — Patient Instructions (Addendum)
Diagnoses and all orders for this visit:  Pap smear for cervical cancer screening -     Cytology - PAP  Right shoulder pain -     traMADol-acetaminophen (ULTRACET) 37.5-325 MG tablet; Take 1 tablet by mouth every 6 (six) hours as needed.  Pain, dental -     Ambulatory referral to Dentistry  Need for hepatitis C screening test -     Hepatitis C antibody, reflex  Allergic sinusitis -     fluticasone (FLONASE) 50 MCG/ACT nasal spray; Place 2 sprays into both nostrils daily. -     cetirizine (ZYRTEC) 10 MG tablet; Take 1 tablet (10 mg total) by mouth daily.  Cerumen impaction, right -     carbamide peroxide (DEBROX) 6.5 % otic solution; Place 5 drops into the right ear 2 (two) times daily.  Controlled type 2 diabetes mellitus without complication, without long-term current use of insulin (HCC) -     Hemoglobin A1c -     COMPLETE METABOLIC PANEL WITH GFR  High triglycerides -     Lipid Panel  Patient desires pregnancy -     FSH/LH -     Ambulatory referral to Obstetrics / Gynecology   F/u in 6 months for HTN and diabetes   Dr. Adrian Blackwater

## 2015-12-21 NOTE — Progress Notes (Signed)
Annual physical  Sexually active, no pain with intercourse  No vaginal discharge  No pain today  No suicidal thoughts in the past two weeks

## 2015-12-22 ENCOUNTER — Telehealth: Payer: Self-pay | Admitting: *Deleted

## 2015-12-22 ENCOUNTER — Encounter: Payer: Self-pay | Admitting: Family Medicine

## 2015-12-22 LAB — COMPLETE METABOLIC PANEL WITH GFR
ALT: 19 U/L (ref 6–29)
AST: 17 U/L (ref 10–35)
Albumin: 4.5 g/dL (ref 3.6–5.1)
Alkaline Phosphatase: 110 U/L (ref 33–130)
BILIRUBIN TOTAL: 0.4 mg/dL (ref 0.2–1.2)
BUN: 11 mg/dL (ref 7–25)
CHLORIDE: 100 mmol/L (ref 98–110)
CO2: 20 mmol/L (ref 20–31)
Calcium: 9.7 mg/dL (ref 8.6–10.4)
Creat: 0.56 mg/dL (ref 0.50–1.05)
Glucose, Bld: 116 mg/dL — ABNORMAL HIGH (ref 65–99)
Potassium: 4.2 mmol/L (ref 3.5–5.3)
Sodium: 138 mmol/L (ref 135–146)
Total Protein: 7.7 g/dL (ref 6.1–8.1)

## 2015-12-22 LAB — LIPID PANEL
CHOL/HDL RATIO: 12.6 ratio — AB (ref <=5.0)
Cholesterol: 290 mg/dL — ABNORMAL HIGH (ref 125–200)
HDL: 23 mg/dL — AB (ref >=46)
TRIGLYCERIDES: 1422 mg/dL — AB (ref <150)

## 2015-12-22 LAB — HEPATITIS C ANTIBODY: HCV Ab: NEGATIVE

## 2015-12-22 NOTE — Telephone Encounter (Signed)
Pt. Returned call. Please f/u °

## 2015-12-22 NOTE — Telephone Encounter (Signed)
-----   Message from Boykin Nearing, MD sent at 12/22/2015  8:37 AM EDT ----- Labs normal A1c 6.5 Cholesterol elevated, add lipitor 40 mg daily

## 2015-12-22 NOTE — Telephone Encounter (Signed)
Date of birth verified by pt  Lab results  A1c 6.5  Lab results normal Elevated cholesterol add Lipitor 40 mg daily Pt verbalized understanding

## 2015-12-22 NOTE — Telephone Encounter (Signed)
Patient is calling back again for her results...  Please follow up  Patient is upset and states it is difficult to get thru to speak to nurse.

## 2015-12-22 NOTE — Telephone Encounter (Signed)
LVM to return call.

## 2015-12-23 DIAGNOSIS — Z9889 Other specified postprocedural states: Secondary | ICD-10-CM | POA: Diagnosis not present

## 2015-12-25 LAB — CERVICOVAGINAL ANCILLARY ONLY
Chlamydia: NEGATIVE
Neisseria Gonorrhea: NEGATIVE
Wet Prep (BD Affirm): NEGATIVE

## 2015-12-25 LAB — CYTOLOGY - PAP

## 2015-12-26 DIAGNOSIS — M24811 Other specific joint derangements of right shoulder, not elsewhere classified: Secondary | ICD-10-CM | POA: Diagnosis not present

## 2015-12-26 DIAGNOSIS — F319 Bipolar disorder, unspecified: Secondary | ICD-10-CM | POA: Diagnosis not present

## 2015-12-26 DIAGNOSIS — M25511 Pain in right shoulder: Secondary | ICD-10-CM | POA: Diagnosis not present

## 2015-12-26 MED FILL — ALL DAY ALLERGY 10 MG TAB: 10 | 30 days supply | Qty: 30 | Fill #0

## 2015-12-26 MED FILL — lamoTRIgine 200 MG TABS: 200 | 30 days supply | Qty: 30 | Fill #0

## 2015-12-26 MED FILL — traZODone HCL 50 MG TABS: 50 | 30 days supply | Qty: 30 | Fill #0

## 2015-12-26 MED FILL — DULoxetine HCL 60 MG CPEP: 60 | 30 days supply | Qty: 30 | Fill #0

## 2015-12-27 ENCOUNTER — Telehealth: Payer: Self-pay | Admitting: *Deleted

## 2015-12-27 DIAGNOSIS — H6121 Impacted cerumen, right ear: Secondary | ICD-10-CM

## 2015-12-27 MED ORDER — ATORVASTATIN CALCIUM 40 MG PO TABS: 40.0000 mg | ORAL_TABLET | Freq: Every day | ORAL | Status: DC

## 2015-12-27 NOTE — Telephone Encounter (Signed)
Date of birth verified by pt  Pap smear Gc/chalm results given  Pt verbalized understanding  Requesting Rx Lipitor send to [place]  Rx send

## 2015-12-27 NOTE — Telephone Encounter (Signed)
-----   Message from Boykin Nearing, MD sent at 12/25/2015  5:58 PM EDT ----- Screening wet prep negative

## 2015-12-27 NOTE — Telephone Encounter (Signed)
-----   Message from Boykin Nearing, MD sent at 12/25/2015  3:40 PM EDT ----- Negative pap Screening Gc/chlam negative

## 2015-12-28 DIAGNOSIS — M24811 Other specific joint derangements of right shoulder, not elsewhere classified: Secondary | ICD-10-CM | POA: Diagnosis not present

## 2015-12-28 DIAGNOSIS — M25511 Pain in right shoulder: Secondary | ICD-10-CM | POA: Diagnosis not present

## 2016-01-02 DIAGNOSIS — M25511 Pain in right shoulder: Secondary | ICD-10-CM | POA: Diagnosis not present

## 2016-01-02 DIAGNOSIS — Z79891 Long term (current) use of opiate analgesic: Secondary | ICD-10-CM | POA: Diagnosis not present

## 2016-01-02 DIAGNOSIS — M5136 Other intervertebral disc degeneration, lumbar region: Secondary | ICD-10-CM | POA: Diagnosis not present

## 2016-01-02 DIAGNOSIS — G8929 Other chronic pain: Secondary | ICD-10-CM | POA: Diagnosis not present

## 2016-01-03 DIAGNOSIS — M25511 Pain in right shoulder: Secondary | ICD-10-CM | POA: Diagnosis not present

## 2016-01-03 DIAGNOSIS — M24811 Other specific joint derangements of right shoulder, not elsewhere classified: Secondary | ICD-10-CM | POA: Diagnosis not present

## 2016-01-08 DIAGNOSIS — M24811 Other specific joint derangements of right shoulder, not elsewhere classified: Secondary | ICD-10-CM | POA: Diagnosis not present

## 2016-01-08 DIAGNOSIS — M25511 Pain in right shoulder: Secondary | ICD-10-CM | POA: Diagnosis not present

## 2016-01-10 DIAGNOSIS — M24811 Other specific joint derangements of right shoulder, not elsewhere classified: Secondary | ICD-10-CM | POA: Diagnosis not present

## 2016-01-10 DIAGNOSIS — M25511 Pain in right shoulder: Secondary | ICD-10-CM | POA: Diagnosis not present

## 2016-01-15 DIAGNOSIS — M24811 Other specific joint derangements of right shoulder, not elsewhere classified: Secondary | ICD-10-CM | POA: Diagnosis not present

## 2016-01-15 DIAGNOSIS — M25511 Pain in right shoulder: Secondary | ICD-10-CM | POA: Diagnosis not present

## 2016-01-17 DIAGNOSIS — M25511 Pain in right shoulder: Secondary | ICD-10-CM | POA: Diagnosis not present

## 2016-01-17 DIAGNOSIS — M24811 Other specific joint derangements of right shoulder, not elsewhere classified: Secondary | ICD-10-CM | POA: Diagnosis not present

## 2016-01-18 ENCOUNTER — Other Ambulatory Visit: Payer: Self-pay | Admitting: Family Medicine

## 2016-01-18 MED FILL — lamoTRIgine 200 MG TABS: 200 | 30 days supply | Qty: 30 | Fill #1

## 2016-01-18 MED FILL — FLUTICASONE PROP 50 MCG SPR: 50 | 20 days supply | Qty: 16 | Fill #0 | Status: TO

## 2016-01-18 MED FILL — LISINOPRIL 10 MG TABLET: 10 | 30 days supply | Qty: 30 | Fill #0 | Status: TO

## 2016-01-18 MED FILL — ATORVASTATIN 40 MG TABLET: 40 | 30 days supply | Qty: 30 | Fill #0

## 2016-01-18 MED FILL — JANUMET 50-500 MG TABLET: 50-500 | 30 days supply | Qty: 60 | Fill #0 | Status: TO

## 2016-01-18 MED FILL — traZODone HCL 50 MG TABS: 50 | 30 days supply | Qty: 30 | Fill #1

## 2016-01-18 MED FILL — DULoxetine HCL 60 MG CPEP: 60 | 30 days supply | Qty: 30 | Fill #1 | Status: TO

## 2016-01-30 DIAGNOSIS — F329 Major depressive disorder, single episode, unspecified: Secondary | ICD-10-CM | POA: Diagnosis not present

## 2016-01-30 DIAGNOSIS — Z79891 Long term (current) use of opiate analgesic: Secondary | ICD-10-CM | POA: Diagnosis not present

## 2016-01-30 DIAGNOSIS — M5136 Other intervertebral disc degeneration, lumbar region: Secondary | ICD-10-CM | POA: Diagnosis not present

## 2016-01-30 DIAGNOSIS — M25511 Pain in right shoulder: Secondary | ICD-10-CM | POA: Diagnosis not present

## 2016-01-30 DIAGNOSIS — M47816 Spondylosis without myelopathy or radiculopathy, lumbar region: Secondary | ICD-10-CM | POA: Diagnosis not present

## 2016-02-02 DIAGNOSIS — G5602 Carpal tunnel syndrome, left upper limb: Secondary | ICD-10-CM | POA: Diagnosis not present

## 2016-02-02 DIAGNOSIS — M546 Pain in thoracic spine: Secondary | ICD-10-CM | POA: Diagnosis not present

## 2016-02-02 DIAGNOSIS — G5601 Carpal tunnel syndrome, right upper limb: Secondary | ICD-10-CM | POA: Diagnosis not present

## 2016-02-02 DIAGNOSIS — M545 Low back pain: Secondary | ICD-10-CM | POA: Diagnosis not present

## 2016-02-06 ENCOUNTER — Telehealth: Payer: Self-pay | Admitting: Family Medicine

## 2016-02-06 DIAGNOSIS — M545 Low back pain: Secondary | ICD-10-CM | POA: Diagnosis not present

## 2016-02-06 NOTE — Telephone Encounter (Signed)
Pt. Called stating that she cut her finger and does not want to go to urgent care or the ED. Pt. Was told that she has an appointment with her PCP on  02/08/16 and she stated that she would wait until her appointment to discuss with her PCP.

## 2016-02-07 DIAGNOSIS — Z9889 Other specified postprocedural states: Secondary | ICD-10-CM | POA: Diagnosis not present

## 2016-02-07 DIAGNOSIS — M546 Pain in thoracic spine: Secondary | ICD-10-CM | POA: Diagnosis not present

## 2016-02-07 DIAGNOSIS — M47816 Spondylosis without myelopathy or radiculopathy, lumbar region: Secondary | ICD-10-CM | POA: Diagnosis not present

## 2016-02-08 ENCOUNTER — Ambulatory Visit: Payer: Medicare Other | Attending: Family Medicine | Admitting: Family Medicine

## 2016-02-08 ENCOUNTER — Encounter: Payer: Self-pay | Admitting: Family Medicine

## 2016-02-08 ENCOUNTER — Telehealth: Payer: Self-pay | Admitting: Pediatrics

## 2016-02-08 VITALS — BP 114/69 | HR 87 | Temp 98.2°F | Ht 59.0 in | Wt 145.6 lb

## 2016-02-08 DIAGNOSIS — R229 Localized swelling, mass and lump, unspecified: Secondary | ICD-10-CM

## 2016-02-08 DIAGNOSIS — M47816 Spondylosis without myelopathy or radiculopathy, lumbar region: Secondary | ICD-10-CM | POA: Diagnosis not present

## 2016-02-08 DIAGNOSIS — E119 Type 2 diabetes mellitus without complications: Secondary | ICD-10-CM

## 2016-02-08 DIAGNOSIS — L719 Rosacea, unspecified: Secondary | ICD-10-CM | POA: Diagnosis not present

## 2016-02-08 DIAGNOSIS — F329 Major depressive disorder, single episode, unspecified: Secondary | ICD-10-CM | POA: Insufficient documentation

## 2016-02-08 DIAGNOSIS — S61012A Laceration without foreign body of left thumb without damage to nail, initial encounter: Secondary | ICD-10-CM

## 2016-02-08 DIAGNOSIS — R220 Localized swelling, mass and lump, head: Secondary | ICD-10-CM

## 2016-02-08 DIAGNOSIS — F32A Depression, unspecified: Secondary | ICD-10-CM

## 2016-02-08 DIAGNOSIS — H6121 Impacted cerumen, right ear: Secondary | ICD-10-CM

## 2016-02-08 DIAGNOSIS — S61002A Unspecified open wound of left thumb without damage to nail, initial encounter: Secondary | ICD-10-CM

## 2016-02-08 DIAGNOSIS — Z0000 Encounter for general adult medical examination without abnormal findings: Secondary | ICD-10-CM

## 2016-02-08 MED ORDER — BRIMONIDINE TARTRATE 0.33 % EX GEL: 1.0000 "application " | Freq: Every day | CUTANEOUS | Status: DC

## 2016-02-08 MED ORDER — VENLAFAXINE HCL ER 75 MG PO CP24: 75.0000 mg | ORAL_CAPSULE | Freq: Every day | ORAL | Status: DC

## 2016-02-08 MED ORDER — CARBAMIDE PEROXIDE 6.5 % OT SOLN: 5.0000 [drp] | Freq: Two times a day (BID) | OTIC | Status: DC

## 2016-02-08 MED FILL — EAR DROPS 6.5%: 6.5 | 10 days supply | Qty: 15 | Fill #0

## 2016-02-08 NOTE — Progress Notes (Signed)
Subjective:  Patient ID: Amy Glass, female    DOB: [birth year]  Age: 55 y.o. MRN: [id number]  CC: Wound Check; Mass; and Rash  Psychiatrist: [facility], Dr. Vaughan Sine Mengitsu   HPI Amy Glass has diabetes and depression she  presents for   1. Rash: on face for past 16 years. Worsening over last 3 years. Redness on forehead and cheeks. With skin sensitivity and burning sensation.   2. L thumb laceration: 2 days ago while slicing a bagel. Bled and was painful. No longer bleeding. Tender to touch. No redness, swelling or drainage.  3. Mass: L scalp behind ear for past 20-30 years. Enlarging over past 6 months. No drainage. Firm. No trauma.   4. Depression: worsening over past 3 months. She is taking Cymbalta. Having depressed mood. Feels useless. Having hot flashes. Irritable. Unemployed.    Social History  Substance Use Topics  . Smoking status: Never Smoker   . Smokeless tobacco: Never Used  . Alcohol Use: No    Outpatient Prescriptions Prior to Visit  Medication Sig Dispense Refill  . ACCU-CHEK FASTCLIX LANCETS MISC 1 each by Does not apply route once. Used TID before meal /code E11.9 102 each 12  . atorvastatin (LIPITOR) 40 MG tablet Take 1 tablet (40 mg total) by mouth daily. 30 tablet 3  . Blood Glucose Monitoring Suppl (ACCU-CHEK AVIVA PLUS) w/Device KIT 1 each by Does not apply route once. Used TID before meal /code E11.9 1 kit 0  . carbamide peroxide (DEBROX) 6.5 % otic solution Place 5 drops into the right ear 2 (two) times daily. 15 mL 0  . cetirizine (ZYRTEC) 10 MG tablet Take 1 tablet (10 mg total) by mouth daily. 30 tablet 11  . Cholecalciferol (VITAMIN D3) 2000 UNITS TABS Take 2,000 Units by mouth daily. (Patient not taking: Reported on 12/21/2015) 30 tablet 11  . dicyclomine (BENTYL) 20 MG tablet Take 1 tablet (20 mg total) by mouth 3 (three) times daily as needed for spasms (and cramping). (Patient not taking: Reported on 12/21/2015) 30 tablet 0  . DULoxetine  (CYMBALTA) 60 MG capsule Take 1 capsule (60 mg total) by mouth daily.  3  . fluticasone (FLONASE) 50 MCG/ACT nasal spray Place 2 sprays into both nostrils daily. 16 g 6  . glucose blood (ACCU-CHEK AVIVA PLUS) test strip Use as instructed TID before meal Code E.11.9 100 each 12  . ibuprofen (ADVIL,MOTRIN) 400 MG tablet Take 1 tablet (400 mg total) by mouth every 6 (six) hours as needed. (Patient not taking: Reported on 12/21/2015) 30 tablet 0  . JANUMET 50-500 MG tablet TAKE 1 TABLET BY MOUTH 2 TIMES DAILY WITH A MEAL. 60 tablet 2  . lisinopril (PRINIVIL,ZESTRIL) 10 MG tablet TAKE 1 TABLET BY MOUTH DAILY. 30 tablet 2  . omeprazole (PRILOSEC) 20 MG capsule Take 1 capsule (20 mg total) by mouth 2 (two) times daily. 60 capsule 3  . traMADol-acetaminophen (ULTRACET) 37.5-325 MG tablet Take 1 tablet by mouth every 6 (six) hours as needed. 60 tablet 0   No facility-administered medications prior to visit.    ROS Review of Systems  Constitutional: Negative for fever and chills.  HENT: Positive for ear pain.   Eyes: Negative for visual disturbance.  Respiratory: Negative for shortness of breath.   Cardiovascular: Negative for chest pain.  Gastrointestinal: Negative for abdominal pain and blood in stool.  Musculoskeletal: Positive for arthralgias. Negative for back pain.  Skin: Positive for rash.  Allergic/Immunologic: Negative for immunocompromised state.  Hematological: Negative for adenopathy. Does not bruise/bleed easily.  Psychiatric/Behavioral: Positive for dysphoric mood. Negative for suicidal ideas.    Objective:  BP 114/69 mmHg  Pulse 87  Temp(Src) 98.2 F (36.8 C) (Oral)  Ht _0  (1.499 m)  Wt 145 lb 9.6 oz (66.044 kg)  BMI 29.39 kg/m2  SpO2 97%  BP/Weight 02/08/2016 02/24/276 10/20/2876  Systolic BP 676 720 947  Diastolic BP 69 79 83  Wt. (Lbs) 145.6 151 143  BMI 29.39 30.48 28.87    Physical Exam  Constitutional: She is oriented to person, place, and time. She appears  well-developed and well-nourished. No distress.  HENT:  Head: Normocephalic and atraumatic.    Cardiovascular: Normal rate, regular rhythm, normal heart sounds and intact distal pulses.   Pulmonary/Chest: Effort normal and breath sounds normal.  Musculoskeletal: She exhibits no edema.       Hands: Neurological: She is alert and oriented to person, place, and time.  Skin: Skin is warm and dry. No rash noted.     Psychiatric: She has a normal mood and affect.    Lab Results  Component Value Date   HGBA1C 6.5* 12/21/2015   Depression screen [facility] 2/9 02/08/2016 09/27/2015 01/16/2015 01/16/2015 01/10/2015  Decreased Interest 3 0 0 0 0  Down, Depressed, Hopeless 3 0 0 0 0  PHQ - 2 Score 6 0 0 0 0  Altered sleeping 3 - - - -  Tired, decreased energy 3 - - - -  Change in appetite 3 - - - -  Feeling bad or failure about yourself  3 - - - -  Trouble concentrating 3 - - - -  Moving slowly or fidgety/restless 3 - - - -  Suicidal thoughts 1 - - - -  PHQ-9 Score 25 - - - -  Difficult doing work/chores Very difficult - - - -    GAD 7 : Generalized Anxiety Score 02/08/2016  Nervous, Anxious, on Edge 3  Control/stop worrying 3  Worry too much - different things 3  Trouble relaxing 3  Restless 2  Easily annoyed or irritable 0  Afraid - awful might happen 1  Total GAD 7 Score 15  Anxiety Difficulty Somewhat difficult      Assessment & Plan:   Othell was seen today for wound check, mass and rash.  Diagnoses and all orders for this visit:  Controlled type 2 diabetes mellitus without complication, without long-term current use of insulin (HCC) -     Cancel: Glucose (CBG) -     Ambulatory referral to Ophthalmology  Cerumen impaction, right -     carbamide peroxide (DEBROX) 6.5 % otic solution; Place 5 drops into the right ear 2 (two) times daily.  Depression -     venlafaxine XR (EFFEXOR XR) 75 MG 24 hr capsule; Take 1 capsule (75 mg total) by mouth daily with breakfast.  Rosacea -      Brimonidine Tartrate 0.33 % GEL; Apply 1 application topically daily.  Swelling edema of scalp during labor  Scalp mass -     DG Skull 1-3 Views; Future  Healthcare maintenance -     Ambulatory referral to Dentistry  Laceration of left thumb, initial encounter    No orders of the defined types were placed in this encounter.    Follow-up: No Follow-up on file.   Boykin Nearing MD

## 2016-02-08 NOTE — Telephone Encounter (Signed)
ERROR

## 2016-02-08 NOTE — Assessment & Plan Note (Signed)
Rosacea Plan Sun avoidance Topical brimonidine

## 2016-02-08 NOTE — Assessment & Plan Note (Signed)
Hard mass feels like assymmetic bony prominence along zygomatic arch  Scalp x-ray Reassurance

## 2016-02-08 NOTE — Assessment & Plan Note (Addendum)
A:  Worsening depression with hot flashes and dysmorphic mood P: Change from cymbalta 60 mg daily to effexor 75 mg XR daily with plan to titrate up to 150 mg as tolerated Advised mental health f/u as well

## 2016-02-08 NOTE — Patient Instructions (Addendum)
Amy Glass was seen today for wound check, mass and rash.  Diagnoses and all orders for this visit:  Controlled type 2 diabetes mellitus without complication, without long-term current use of insulin (HCC) -     Cancel: Glucose (CBG) -     Ambulatory referral to Ophthalmology  Cerumen impaction, right -     carbamide peroxide (DEBROX) 6.5 % otic solution; Place 5 drops into the right ear 2 (two) times daily.  Depression -     venlafaxine XR (EFFEXOR XR) 75 MG 24 hr capsule; Take 1 capsule (75 mg total) by mouth daily with breakfast.  Rosacea -     Brimonidine Tartrate 0.33 % GEL; Apply 1 application topically daily.  Swelling edema of scalp during labor  Scalp mass -     DG Skull 1-3 Views; Future  Healthcare maintenance -     Ambulatory referral to Dentistry   Stop cymbalta switch to effexor 75 mg daily. These are in the same class. I will send your records to [facility]  F/u with Amy Glass for your depression.   F/u here in 8 weeks   Amy Glass

## 2016-02-12 MED FILL — VENLAFAXINE HCL ER 75 MG CA: 75 | 30 days supply | Qty: 30 | Fill #0 | Status: TO

## 2016-02-19 ENCOUNTER — Other Ambulatory Visit: Payer: Self-pay | Admitting: Internal Medicine

## 2016-02-19 ENCOUNTER — Telehealth: Payer: Self-pay | Admitting: Internal Medicine

## 2016-02-19 MED ORDER — OMEPRAZOLE 20 MG PO CPDR: 20.0000 mg | DELAYED_RELEASE_CAPSULE | Freq: Two times a day (BID) | ORAL | 3 refills | Status: DC

## 2016-02-19 NOTE — Telephone Encounter (Signed)
Prescription refill sent to pharmacy for pt. Per last note from Dr. Hilarie Fredrickson if pt was still having issues she could be referred to [facility] esophageal disorder clinic for a second opinion. Left message for pt to call back if she was interested in being referred to [facility].

## 2016-02-27 ENCOUNTER — Ambulatory Visit (HOSPITAL_COMMUNITY)
Admission: RE | Admit: 2016-02-27 | Discharge: 2016-02-27 | Disposition: A | Discharge status: Home / Self Care | Payer: Medicare Other | Source: Ambulatory Visit | Attending: Family Medicine | Admitting: Family Medicine

## 2016-02-27 ENCOUNTER — Ambulatory Visit: Payer: Medicare Other | Admitting: Podiatry

## 2016-02-27 DIAGNOSIS — D169 Benign neoplasm of bone and articular cartilage, unspecified: Secondary | ICD-10-CM | POA: Diagnosis not present

## 2016-02-27 DIAGNOSIS — R221 Localized swelling, mass and lump, neck: Secondary | ICD-10-CM | POA: Diagnosis not present

## 2016-02-27 DIAGNOSIS — R229 Localized swelling, mass and lump, unspecified: Secondary | ICD-10-CM | POA: Diagnosis present

## 2016-02-27 DIAGNOSIS — R220 Localized swelling, mass and lump, head: Secondary | ICD-10-CM

## 2016-02-28 ENCOUNTER — Other Ambulatory Visit: Payer: Self-pay | Admitting: Family Medicine

## 2016-02-28 ENCOUNTER — Telehealth: Payer: Self-pay

## 2016-02-28 DIAGNOSIS — L719 Rosacea, unspecified: Secondary | ICD-10-CM

## 2016-02-28 DIAGNOSIS — G5601 Carpal tunnel syndrome, right upper limb: Secondary | ICD-10-CM | POA: Diagnosis not present

## 2016-02-28 MED ORDER — BRIMONIDINE TARTRATE 0.33 % EX GEL: 1.0000 "application " | Freq: Every day | CUTANEOUS | 0 refills | Status: DC

## 2016-02-28 NOTE — Telephone Encounter (Signed)
Meds ordered

## 2016-02-28 NOTE — Telephone Encounter (Signed)
Pt. Returned call. Please f/u °

## 2016-02-28 NOTE — Telephone Encounter (Signed)
Left message for patient to return the phone call.

## 2016-02-28 NOTE — Telephone Encounter (Signed)
Pt called returning nurse's call to review results

## 2016-02-28 NOTE — Telephone Encounter (Signed)
Pt. Called to let her PCP know that she had an x-ray done and pt. Would also like an Rx for a cream because she has a rash in her face. Pt. States that her PCP had prescribed her the cream before. Please f/u with pt.

## 2016-03-01 NOTE — Telephone Encounter (Signed)
Pt. Returned call. Please f/u °

## 2016-03-01 NOTE — Telephone Encounter (Signed)
Left message on patient's voicemail.

## 2016-03-04 DIAGNOSIS — G5601 Carpal tunnel syndrome, right upper limb: Secondary | ICD-10-CM | POA: Diagnosis not present

## 2016-03-05 ENCOUNTER — Emergency Department (HOSPITAL_COMMUNITY)
Admission: EM | Admit: 2016-03-05 | Discharge: 2016-03-06 | Discharge status: Against Medical Advice | Payer: Medicare Other

## 2016-03-05 NOTE — ED Triage Notes (Signed)
Called to Triage x's 2 without any answer

## 2016-03-06 NOTE — Telephone Encounter (Signed)
Returned patient phone call , no answer results will be mailed out today 8/16.

## 2016-03-13 DIAGNOSIS — M5136 Other intervertebral disc degeneration, lumbar region: Secondary | ICD-10-CM | POA: Diagnosis not present

## 2016-03-13 DIAGNOSIS — Z79891 Long term (current) use of opiate analgesic: Secondary | ICD-10-CM | POA: Diagnosis not present

## 2016-03-13 DIAGNOSIS — M25511 Pain in right shoulder: Secondary | ICD-10-CM | POA: Diagnosis not present

## 2016-03-13 DIAGNOSIS — M47816 Spondylosis without myelopathy or radiculopathy, lumbar region: Secondary | ICD-10-CM | POA: Diagnosis not present

## 2016-03-13 DIAGNOSIS — G8929 Other chronic pain: Secondary | ICD-10-CM | POA: Diagnosis not present

## 2016-03-19 DIAGNOSIS — F319 Bipolar disorder, unspecified: Secondary | ICD-10-CM | POA: Diagnosis not present

## 2016-03-22 ENCOUNTER — Other Ambulatory Visit: Payer: Self-pay | Admitting: Family Medicine

## 2016-04-02 DIAGNOSIS — M5416 Radiculopathy, lumbar region: Secondary | ICD-10-CM | POA: Diagnosis not present

## 2016-04-11 DIAGNOSIS — M5136 Other intervertebral disc degeneration, lumbar region: Secondary | ICD-10-CM | POA: Diagnosis not present

## 2016-04-11 DIAGNOSIS — M47816 Spondylosis without myelopathy or radiculopathy, lumbar region: Secondary | ICD-10-CM | POA: Diagnosis not present

## 2016-04-11 DIAGNOSIS — Z79891 Long term (current) use of opiate analgesic: Secondary | ICD-10-CM | POA: Diagnosis not present

## 2016-04-11 DIAGNOSIS — M25511 Pain in right shoulder: Secondary | ICD-10-CM | POA: Diagnosis not present

## 2016-04-11 DIAGNOSIS — F329 Major depressive disorder, single episode, unspecified: Secondary | ICD-10-CM | POA: Diagnosis not present

## 2016-04-22 ENCOUNTER — Ambulatory Visit: Payer: Medicare Other | Admitting: Internal Medicine

## 2016-04-23 ENCOUNTER — Telehealth: Payer: Self-pay | Admitting: *Deleted

## 2016-04-23 DIAGNOSIS — M545 Low back pain: Secondary | ICD-10-CM | POA: Diagnosis not present

## 2016-04-23 DIAGNOSIS — M4716 Other spondylosis with myelopathy, lumbar region: Secondary | ICD-10-CM | POA: Diagnosis not present

## 2016-04-23 NOTE — Telephone Encounter (Signed)
No show letter mailed to patient. 

## 2016-04-26 DIAGNOSIS — M722 Plantar fascial fibromatosis: Secondary | ICD-10-CM | POA: Diagnosis not present

## 2016-05-06 DIAGNOSIS — M48061 Spinal stenosis, lumbar region without neurogenic claudication: Secondary | ICD-10-CM | POA: Diagnosis not present

## 2016-05-09 ENCOUNTER — Telehealth: Payer: Self-pay | Admitting: Family Medicine

## 2016-05-09 DIAGNOSIS — J309 Allergic rhinitis, unspecified: Secondary | ICD-10-CM

## 2016-05-09 DIAGNOSIS — F329 Major depressive disorder, single episode, unspecified: Secondary | ICD-10-CM

## 2016-05-09 DIAGNOSIS — F32A Depression, unspecified: Secondary | ICD-10-CM

## 2016-05-09 MED ORDER — FLUTICASONE PROPIONATE 50 MCG/ACT NA SUSP: 2.0000 | Freq: Every day | NASAL | 0 refills | Status: DC

## 2016-05-09 MED ORDER — LISINOPRIL 10 MG PO TABS: 10.0000 mg | ORAL_TABLET | Freq: Every day | ORAL | 0 refills | Status: DC

## 2016-05-09 MED ORDER — SITAGLIPTIN PHOS-METFORMIN HCL 50-500 MG PO TABS: ORAL_TABLET | ORAL | 0 refills | Status: DC

## 2016-05-09 MED ORDER — ATORVASTATIN CALCIUM 40 MG PO TABS: 40.0000 mg | ORAL_TABLET | Freq: Every day | ORAL | 0 refills | Status: DC

## 2016-05-09 MED ORDER — CETIRIZINE HCL 10 MG PO TABS: 10.0000 mg | ORAL_TABLET | Freq: Every day | ORAL | 0 refills | Status: DC

## 2016-05-09 MED ORDER — OMEPRAZOLE 20 MG PO CPDR: 20.0000 mg | DELAYED_RELEASE_CAPSULE | Freq: Two times a day (BID) | ORAL | 0 refills | Status: DC

## 2016-05-09 MED ORDER — VENLAFAXINE HCL ER 75 MG PO CP24: 75.0000 mg | ORAL_CAPSULE | Freq: Every day | ORAL | 0 refills | Status: DC

## 2016-05-09 NOTE — Telephone Encounter (Signed)
Requested medications refilled - patient needs office visit with Dr. Funches 

## 2016-05-09 NOTE — Telephone Encounter (Signed)
Pt. Called requesting a refill on all her current medications. Pt. [facility]ses [facility] [address]. Please f/u

## 2016-05-16 DIAGNOSIS — M722 Plantar fascial fibromatosis: Secondary | ICD-10-CM | POA: Diagnosis not present

## 2016-05-16 DIAGNOSIS — M5136 Other intervertebral disc degeneration, lumbar region: Secondary | ICD-10-CM | POA: Diagnosis not present

## 2016-05-16 DIAGNOSIS — M25511 Pain in right shoulder: Secondary | ICD-10-CM | POA: Diagnosis not present

## 2016-05-16 DIAGNOSIS — Z79891 Long term (current) use of opiate analgesic: Secondary | ICD-10-CM | POA: Diagnosis not present

## 2016-05-16 DIAGNOSIS — G5602 Carpal tunnel syndrome, left upper limb: Secondary | ICD-10-CM | POA: Diagnosis not present

## 2016-05-16 DIAGNOSIS — G5601 Carpal tunnel syndrome, right upper limb: Secondary | ICD-10-CM | POA: Diagnosis not present

## 2016-05-16 DIAGNOSIS — M47816 Spondylosis without myelopathy or radiculopathy, lumbar region: Secondary | ICD-10-CM | POA: Diagnosis not present

## 2016-05-17 ENCOUNTER — Telehealth: Payer: Self-pay | Admitting: Family Medicine

## 2016-05-17 NOTE — Telephone Encounter (Signed)
Patient called the office to speak with nurse and/or PCP regarding the pain and pressure that she has been feeling. Advice patient to go to Urgent care because we don't have availability. Patient said no. Please follow up.

## 2016-05-21 NOTE — Telephone Encounter (Signed)
Requested return call to [facility].

## 2016-05-22 DIAGNOSIS — G5601 Carpal tunnel syndrome, right upper limb: Secondary | ICD-10-CM | POA: Diagnosis not present

## 2016-05-22 DIAGNOSIS — G5602 Carpal tunnel syndrome, left upper limb: Secondary | ICD-10-CM | POA: Diagnosis not present

## 2016-05-24 ENCOUNTER — Ambulatory Visit: Payer: Medicare Other | Attending: Family Medicine | Admitting: Physician Assistant

## 2016-05-24 VITALS — BP 106/72 | HR 84 | Temp 97.9°F | Resp 16 | Ht 59.0 in | Wt 144.0 lb

## 2016-05-24 DIAGNOSIS — Z888 Allergy status to other drugs, medicaments and biological substances status: Secondary | ICD-10-CM | POA: Diagnosis not present

## 2016-05-24 DIAGNOSIS — B356 Tinea cruris: Secondary | ICD-10-CM | POA: Diagnosis not present

## 2016-05-24 DIAGNOSIS — R21 Rash and other nonspecific skin eruption: Secondary | ICD-10-CM | POA: Diagnosis present

## 2016-05-24 DIAGNOSIS — H02403 Unspecified ptosis of bilateral eyelids: Secondary | ICD-10-CM | POA: Diagnosis not present

## 2016-05-24 DIAGNOSIS — E119 Type 2 diabetes mellitus without complications: Secondary | ICD-10-CM | POA: Diagnosis not present

## 2016-05-24 DIAGNOSIS — I10 Essential (primary) hypertension: Secondary | ICD-10-CM | POA: Insufficient documentation

## 2016-05-24 LAB — GLUCOSE, POCT (MANUAL RESULT ENTRY): POC GLUCOSE: 143 mg/dL — AB (ref 70–99)

## 2016-05-24 LAB — POCT GLYCOSYLATED HEMOGLOBIN (HGB A1C): HEMOGLOBIN A1C: 6.1

## 2016-05-24 MED ORDER — KETOCONAZOLE 200 MG PO TABS: 200.0000 mg | ORAL_TABLET | Freq: Every day | ORAL | 0 refills | Status: DC

## 2016-05-24 NOTE — Patient Instructions (Signed)
Do not take Atorvastatin for the next 3 weeks  Body Ringworm Ringworm (tinea corporis) is a fungal infection of the skin on the body. This infection is not caused by worms, but is actually caused by a fungus. Fungus normally lives on the top of your skin and can be useful. However, in the case of ringworms, the fungus grows out of control and causes a skin infection. It can involve any area of skin on the body and can spread easily from one person to another (contagious). Ringworm is a common problem for children, but it can affect adults as well. Ringworm is also often found in athletes, especially wrestlers who share equipment and mats.  CAUSES  Ringworm of the body is caused by a fungus called dermatophyte. It can spread by:  Touchingother people who are infected.  Touchinginfected pets.  Touching or sharingobjects that have been in contact with the infected person or pet (hats, combs, towels, clothing, sports equipment). SYMPTOMS   Itchy, raised red spots and bumps on the skin.  Ring-shaped rash.  Redness near the border of the rash with a clear center.  Dry and scaly skin on or around the rash. Not every person develops a ring-shaped rash. Some develop only the red, scaly patches. DIAGNOSIS  Most often, ringworm can be diagnosed by performing a skin exam. Your caregiver may choose to take a skin scraping from the affected area. The sample will be examined under the microscope to see if the fungus is present.  TREATMENT  Body ringworm may be treated with a topical antifungal cream or ointment. Sometimes, an antifungal shampoo that can be used on your body is prescribed. You may be prescribed antifungal medicines to take by mouth if your ringworm is severe, keeps coming back, or lasts a long time.  HOME CARE INSTRUCTIONS   Only take over-the-counter or prescription medicines as directed by your caregiver.  Wash the infected area and dry it completely before applying yourcream or  ointment.  When using antifungal shampoo to treat the ringworm, leave the shampoo on the body for 3-5 minutes before rinsing.   Wear loose clothing to stop clothes from rubbing and irritating the rash.  Wash or change your bed sheets every night while you have the rash.  Have your pet treated by your veterinarian if it has the same infection. To prevent ringworm:   Practice good hygiene.  Wear sandals or shoes in public places and showers.  Do not share personal items with others.  Avoid touching red patches of skin on other people.  Avoid touching pets that have bald spots or wash your hands after doing so. SEEK MEDICAL CARE IF:   Your rash continues to spread after 7 days of treatment.  Your rash is not gone in 4 weeks.  The area around your rash becomes red, warm, tender, and swollen.   This information is not intended to replace advice given to you by your health care provider. Make sure you discuss any questions you have with your health care provider.   Document Released: 07/05/2000 Document Revised: 04/01/2012 Document Reviewed: 01/20/2012 Elsevier Interactive Patient Education [employer].

## 2016-05-24 NOTE — Progress Notes (Signed)
C/o Ha x 2 months. Tried cold packs. Vaginal itching x 2 months. Tried vagisil.

## 2016-05-25 NOTE — Progress Notes (Signed)
Amy Glass, is a 55 y.o. female  ZCH:[id number]  OIN:[id number]  DOB - 28-Aug-1960  Subjective:  Chief Complaint and HPI: Amy Glass is a 55 y.o. female here today for rash in the inguinal region and labia majora.  Balmex/diaper rash ointment has helped. The rash is pruritic. This has been going on about 2-3 months.  She denies vaginal discharge/pelvic pain/ or urinary s/sx.  She is compliant with her diabetes medications. Also, she wants to see about seeing a surgeon to remove excess skin from her upper eyelids.  This is a problem she has had for many years that has worsened with age.  No vision changes. No other complaints today.   ROS:   Constitutional:  No f/c, No night sweats, No unexplained weight loss. EENT:  No vision changes, No blurry vision, No hearing changes. No mouth, throat, or ear problems.  Respiratory: No cough, No SOB Cardiac: No CP, no palpitations GI:  No abd pain, No N/V/D. GU: No Urinary s/sx, no vaginal d/c Musculoskeletal: No joint pain Neuro: No headache, no dizziness, no motor weakness.  Skin: + rash external genitalia Endocrine:  No polydipsia. No polyuria.  Psych: Denies SI/HI  No problems updated.  ALLERGIES: Allergies  Allergen Reactions  . Nitroglycerin Other (See Comments)    Migraines, (only tried nitro patch. Has never tried the pills)    PAST MEDICAL HISTORY: Past Medical History:  Diagnosis Date  . Allergy   . Anxiety    as child   . Depression    as child  . Diabetes mellitus without complication ([place]) Dx 7096  . Diverticulosis   . Hiatal hernia   . Hyperplastic colon polyp   . Hypertension Dx 2003  . Schatzki's ring     MEDICATIONS AT HOME: Prior to Admission medications   Medication Sig Start Date End Date Taking? Authorizing Provider  atorvastatin (LIPITOR) 40 MG tablet Take 1 tablet (40 mg total) by mouth daily. 05/09/16  Yes Josalyn Funches, MD  cetirizine (ZYRTEC) 10 MG tablet Take 1 tablet (10 mg total) by mouth  daily. 05/09/16  Yes Josalyn Funches, MD  fluticasone (FLONASE) 50 MCG/ACT nasal spray Place 2 sprays into both nostrils daily. 05/09/16  Yes Josalyn Funches, MD  lisinopril (PRINIVIL,ZESTRIL) 10 MG tablet Take 1 tablet (10 mg total) by mouth daily. 05/09/16  Yes Josalyn Funches, MD  omeprazole (PRILOSEC) 20 MG capsule Take 1 capsule (20 mg total) by mouth 2 (two) times daily. 05/09/16  Yes Josalyn Funches, MD  sitaGLIPtin-metformin (JANUMET) 50-500 MG tablet TAKE 1 TABLET BY MOUTH 2 TIMES DAILY WITH A MEAL. 05/09/16  Yes Josalyn Funches, MD  venlafaxine XR (EFFEXOR XR) 75 MG 24 hr capsule Take 1 capsule (75 mg total) by mouth daily with breakfast. 05/09/16  Yes Boykin Nearing, MD  ACCU-CHEK FASTCLIX LANCETS MISC 1 each by Does not apply route once. Used TID before meal /code E11.9 10/09/15   Josalyn Funches, MD  Blood Glucose Monitoring Suppl (ACCU-CHEK AVIVA PLUS) w/Device KIT 1 each by Does not apply route once. Used TID before meal /code E11.9 10/09/15   Josalyn Funches, MD  Brimonidine Tartrate 0.33 % GEL Apply 1 application topically daily. 02/28/16   Josalyn Funches, MD  carbamide peroxide (DEBROX) 6.5 % otic solution Place 5 drops into the right ear 2 (two) times daily. Patient not taking: Reported on 05/24/2016 02/08/16   Boykin Nearing, MD  Cholecalciferol (VITAMIN D3) 2000 UNITS TABS Take 2,000 Units by mouth daily. Patient not taking: Reported on 12/21/2015  12/05/14   Josalyn Funches, MD  dicyclomine (BENTYL) 20 MG tablet Take 1 tablet (20 mg total) by mouth 3 (three) times daily as needed for spasms (and cramping). Patient not taking: Reported on 12/21/2015 03/28/15   Jerene Bears, MD  glucose blood (ACCU-CHEK AVIVA PLUS) test strip Use as instructed TID before meal Code E.11.9 10/09/15   Boykin Nearing, MD  ketoconazole (NIZORAL) 200 MG tablet Take 1 tablet (200 mg total) by mouth daily. 05/24/16   Argentina Donovan, PA-C     Objective:  EXAM:   Vitals:   05/24/16 1458  BP: 106/72  Pulse:  84  Resp: 16  Temp: 97.9 F (36.6 C)  TempSrc: Oral  SpO2: 96%  Weight: 144 lb (65.3 kg)  Height: '4\' 11"'  (1.499 m)    General appearance : A&OX3. NAD. Non-toxic-appearing HEENT: Atraumatic and Normocephalic.  PERRLA. EOM intact.  Thick upper lids with excess skin.  Neurologically normal, but excess skin into line of vision problematic.  TM clear B. Mouth-MMM, post pharynx WNL w/o erythema, No PND. Neck: supple, no JVD. No cervical lymphadenopathy. No thyromegaly Chest/Lungs:  Breathing-non-labored, Good air entry bilaterally, breath sounds normal without rales, rhonchi, or wheezing  CVS: S1 S2 regular, no murmurs, gallops, rubs  GU:  External genitalia with beefy erythema, well demarcated rash around labia major and inguinal region with satellite lesions present.  No internal exam Extremities: Bilateral Lower Ext shows no edema, both legs are warm to touch with = pulse throughout Neurology:  CN II-XII grossly intact, Non focal.   Psych:  TP linear. J/I WNL. Normal speech. Appropriate eye contact and affect.  Skin:  No Rash  Data Review Lab Results  Component Value Date   HGBA1C 6.1 05/24/2016   HGBA1C 6.5 (H) 12/21/2015   HGBA1C 6.4 12/02/2014     Assessment & Plan   1. Controlled type 2 diabetes mellitus without complication, without long-term current use of insulin (HCC) Improving control-continue current regimen - POCT glucose (manual entry) - HgB A1c  2. Tinea cruris - ketoconazole (NIZORAL) 200 MG tablet; Take 1 tablet (200 mg total) by mouth daily.  Dispense: 14 tablet; Refill: 0  3. Blepharoptosis of both eyelids - Ambulatory referral to Plastic Surgery     Patient have been counseled extensively about nutrition and exercise  Return in about 3 months (around 08/24/2016) for f/up Dr Adrian Blackwater DM/htn/high cholesterol.  The patient was given clear instructions to go to ER or return to medical center if symptoms don't improve, worsen or new problems develop. The  patient verbalized understanding. The patient was told to call to get lab results if they haven't heard anything in the next week.     Freeman Caldron, PA-C [facility] [place] [place], [place]   05/25/2016, 5:22 PMPatient ID: [name], female   DOB: 1960/12/24, 55 y.o.   MRN: [id number]

## 2016-05-27 ENCOUNTER — Telehealth: Payer: Self-pay | Admitting: Family Medicine

## 2016-05-27 DIAGNOSIS — B356 Tinea cruris: Secondary | ICD-10-CM

## 2016-05-27 NOTE — Telephone Encounter (Signed)
[facility] pharmacy calling stating there is a drug interaction with ketoconazole (NIZORAL) 200 MG tablet  Pharmacy states the insurance company needs an override or else for the Rx to be changed  Pharmacy states they have been talking to the clinical team for the insurance company for a prior authoirzation and have been unsuccessful. Pharmacy provided prior authorization number: [id number]

## 2016-05-28 MED ORDER — TERBINAFINE HCL 250 MG PO TABS: 250.0000 mg | ORAL_TABLET | Freq: Every day | ORAL | 0 refills | Status: DC

## 2016-05-28 NOTE — Telephone Encounter (Signed)
Pt was called on 11/07 and a VM was left for pt informing her of lamisil script being sent over to her pharmacy.

## 2016-05-28 NOTE — Telephone Encounter (Signed)
lamisil ordered to replace nizoral for tinea cruris

## 2016-05-31 DIAGNOSIS — G5601 Carpal tunnel syndrome, right upper limb: Secondary | ICD-10-CM | POA: Diagnosis not present

## 2016-06-04 ENCOUNTER — Other Ambulatory Visit: Payer: Self-pay | Admitting: Family Medicine

## 2016-06-05 ENCOUNTER — Other Ambulatory Visit: Payer: Self-pay | Admitting: Family Medicine

## 2016-06-05 DIAGNOSIS — F329 Major depressive disorder, single episode, unspecified: Secondary | ICD-10-CM

## 2016-06-05 DIAGNOSIS — F32A Depression, unspecified: Secondary | ICD-10-CM

## 2016-06-06 ENCOUNTER — Encounter (HOSPITAL_COMMUNITY): Payer: Self-pay | Admitting: Emergency Medicine

## 2016-06-06 ENCOUNTER — Emergency Department (HOSPITAL_COMMUNITY): Payer: Medicare Other

## 2016-06-06 ENCOUNTER — Other Ambulatory Visit: Payer: Self-pay | Admitting: Family Medicine

## 2016-06-06 DIAGNOSIS — R1310 Dysphagia, unspecified: Secondary | ICD-10-CM | POA: Insufficient documentation

## 2016-06-06 DIAGNOSIS — E119 Type 2 diabetes mellitus without complications: Secondary | ICD-10-CM | POA: Insufficient documentation

## 2016-06-06 DIAGNOSIS — R1013 Epigastric pain: Secondary | ICD-10-CM | POA: Diagnosis not present

## 2016-06-06 DIAGNOSIS — I10 Essential (primary) hypertension: Secondary | ICD-10-CM | POA: Diagnosis not present

## 2016-06-06 LAB — I-STAT CHEM 8, ED
BUN: 8 mg/dL (ref 6–20)
CALCIUM ION: 1.15 mmol/L (ref 1.15–1.40)
CREATININE: 0.8 mg/dL (ref 0.44–1.00)
Chloride: 98 mmol/L — ABNORMAL LOW (ref 101–111)
GLUCOSE: 115 mg/dL — AB (ref 65–99)
HEMATOCRIT: 38 % (ref 36.0–46.0)
Hemoglobin: 12.9 g/dL (ref 12.0–15.0)
Potassium: 3.9 mmol/L (ref 3.5–5.1)
Sodium: 136 mmol/L (ref 135–145)
TCO2: 24 mmol/L (ref 0–100)

## 2016-06-06 NOTE — ED Triage Notes (Signed)
Patient with problems with food getting stuck after she eats.  She does not have anything stuck in her throat at this time, she feels it is stuck lower near her stomach.  Patient has had testing, but it continues to get stuck.

## 2016-06-07 ENCOUNTER — Emergency Department (HOSPITAL_COMMUNITY)
Admission: EM | Admit: 2016-06-07 | Discharge: 2016-06-07 | Disposition: A | Discharge status: Home / Self Care | Payer: Medicare Other | Attending: Emergency Medicine | Admitting: Emergency Medicine

## 2016-06-07 DIAGNOSIS — R1013 Epigastric pain: Secondary | ICD-10-CM

## 2016-06-07 DIAGNOSIS — R1310 Dysphagia, unspecified: Secondary | ICD-10-CM

## 2016-06-07 LAB — COMPREHENSIVE METABOLIC PANEL
ALK PHOS: 71 U/L (ref 38–126)
ALT: 18 U/L (ref 14–54)
ANION GAP: 10 (ref 5–15)
AST: 24 U/L (ref 15–41)
Albumin: 4.5 g/dL (ref 3.5–5.0)
BILIRUBIN TOTAL: 0.6 mg/dL (ref 0.3–1.2)
BUN: 7 mg/dL (ref 6–20)
CHLORIDE: 101 mmol/L (ref 101–111)
CO2: 26 mmol/L (ref 22–32)
Calcium: 9.8 mg/dL (ref 8.9–10.3)
Creatinine, Ser: 0.67 mg/dL (ref 0.44–1.00)
GLUCOSE: 111 mg/dL — AB (ref 65–99)
Potassium: 3.7 mmol/L (ref 3.5–5.1)
Sodium: 137 mmol/L (ref 135–145)
Total Protein: 7.3 g/dL (ref 6.5–8.1)

## 2016-06-07 LAB — CBC WITH DIFFERENTIAL/PLATELET
BASOS ABS: 0.1 10*3/uL (ref 0.0–0.1)
Basophils Relative: 1 %
Eosinophils Absolute: 0.1 10*3/uL (ref 0.0–0.7)
Eosinophils Relative: 2 %
HEMATOCRIT: 39.2 % (ref 36.0–46.0)
Hemoglobin: 12.7 g/dL (ref 12.0–15.0)
LYMPHS PCT: 43 %
Lymphs Abs: 2.5 10*3/uL (ref 0.7–4.0)
MCH: 27.8 pg (ref 26.0–34.0)
MCHC: 32.4 g/dL (ref 30.0–36.0)
MCV: 85.8 fL (ref 78.0–100.0)
MONO ABS: 0.4 10*3/uL (ref 0.1–1.0)
Monocytes Relative: 6 %
NEUTROS ABS: 2.9 10*3/uL (ref 1.7–7.7)
Neutrophils Relative %: 48 %
Platelets: 251 10*3/uL (ref 150–400)
RBC: 4.57 MIL/uL (ref 3.87–5.11)
RDW: 13.5 % (ref 11.5–15.5)
WBC: 6 10*3/uL (ref 4.0–10.5)

## 2016-06-07 LAB — LIPASE, BLOOD: Lipase: 31 U/L (ref 11–51)

## 2016-06-07 LAB — TROPONIN I

## 2016-06-07 MED ORDER — METOCLOPRAMIDE HCL 10 MG PO TABS: 10.0000 mg | ORAL_TABLET | Freq: Four times a day (QID) | ORAL | 0 refills | Status: DC

## 2016-06-07 MED ORDER — GI COCKTAIL ~~LOC~~
30.0000 mL | Freq: Once | ORAL | Status: AC
  Administered 2016-06-07: 30 mL via ORAL
  Filled 2016-06-07: qty 30

## 2016-06-07 MED ORDER — GLUCAGON HCL RDNA (DIAGNOSTIC) 1 MG IJ SOLR
1.0000 mg | Freq: Once | INTRAMUSCULAR | Status: AC
  Administered 2016-06-07: 1 mg via INTRAVENOUS
  Filled 2016-06-07: qty 1

## 2016-06-07 MED ORDER — ONDANSETRON HCL 4 MG/2ML IJ SOLN
4.0000 mg | Freq: Once | INTRAMUSCULAR | Status: AC
  Administered 2016-06-07: 4 mg via INTRAVENOUS
  Filled 2016-06-07: qty 2

## 2016-06-07 NOTE — Discharge Instructions (Signed)
Call Dr. Hilarie Fredrickson to try to get a sooner appointment. Take your medications as prescribed. Return to the ED if you develop new or worsening symptoms.

## 2016-06-07 NOTE — ED Notes (Signed)
Pt very anxious to leave, refused discharge vitals.  Pt removed IV herself.

## 2016-06-07 NOTE — ED Provider Notes (Signed)
[facility] DEPT Provider Note   CSN: [id number] Arrival date & time: 06/06/16  2309 By signing my name below, I, Dyke Brackett, attest that this documentation has been prepared under the direction and in the presence of Ezequiel Essex, MD . Electronically Signed: Dyke Brackett, Scribe. 06/07/2016. 3:26 AM.   History   Chief Complaint Chief Complaint  Patient presents with  . Foreign Body   HPI Amy Glass is a 55 y.o. female with hx of HTN, DM, hiatal hernia who presents to the Emergency Department complaining of sensation of food getting stuck in her throat which began 20 years ago, but has worsened over the last six months. Pt is followed at [facility]; last dilation one year ago. Pt had EGD in 01/24/15 that showed reflux esophagitis, hiatal hernia, and Schatzki's ring at the  junction. She notes associated nausea which has resolved, abdominal pain, chest pain and back pain while swallowing. She has taken Prilosec with no relief. No cardiac hx. Pt denies vomiting or diarrhea.   The history is provided by the patient. No language interpreter was used.   Past Medical History:  Diagnosis Date  . Allergy   . Anxiety    as child   . Depression    as child  . Diabetes mellitus without complication ([place]) Dx 9211  . Diverticulosis   . Hiatal hernia   . Hyperplastic colon polyp   . Hypertension Dx 2003  . Schatzki's ring     Patient Active Problem List   Diagnosis Date Noted  . Depression 02/08/2016  . Scalp mass 02/08/2016  . Rosacea 02/08/2016  . Laceration of left thumb 02/08/2016  . Right shoulder pain 12/22/2015  . Cerumen impaction 12/22/2015  . Allergic sinusitis 12/22/2015  . Patient desires pregnancy 12/22/2015  . Dysphagia   . Acanthosis nigricans 09/27/2015  . Breast pain, right 09/27/2015  . TMJ pain dysfunction syndrome 01/16/2015  . Poor dentition 01/16/2015  . Lumbar degenerative disc disease 01/10/2015  . Vitamin D deficiency 12/05/2014  . High  triglycerides 12/05/2014  . Diabetes type 2, controlled ([place]) 12/02/2014  . Lateral pain of right hip 12/02/2014  . Numbness and tingling in right hand 12/02/2014    Past Surgical History:  Procedure Laterality Date  . BACK SURGERY  2002   lumbar  . CESAREAN SECTION  05/20/2003   . ESOPHAGEAL MANOMETRY N/A 10/25/2015   Procedure: ESOPHAGEAL MANOMETRY (EM);  Surgeon: Jerene Bears, MD;  Location: [facility] ENDOSCOPY;  Service: Gastroenterology;  Laterality: N/A;  . SHOULDER SURGERY  08/2013    b/l shoulder     OB History    No data available      Home Medications    Prior to Admission medications   Medication Sig Start Date End Date Taking? Authorizing Provider  ACCU-CHEK FASTCLIX LANCETS MISC 1 each by Does not apply route once. Used TID before meal /code E11.9 10/09/15   Josalyn Funches, MD  atorvastatin (LIPITOR) 40 MG tablet TAKE 1 TABLET (40 MG TOTAL) BY MOUTH DAILY. 06/06/16   Argentina Donovan, PA-C  Blood Glucose Monitoring Suppl (ACCU-CHEK AVIVA PLUS) w/Device KIT 1 each by Does not apply route once. Used TID before meal /code E11.9 10/09/15   Josalyn Funches, MD  Brimonidine Tartrate 0.33 % GEL Apply 1 application topically daily. 02/28/16   Josalyn Funches, MD  carbamide peroxide (DEBROX) 6.5 % otic solution Place 5 drops into the right ear 2 (two) times daily. Patient not taking: Reported on 05/24/2016 02/08/16  Boykin Nearing, MD  cetirizine (ZYRTEC) 10 MG tablet Take 1 tablet (10 mg total) by mouth daily. 05/09/16   Josalyn Funches, MD  Cholecalciferol (VITAMIN D3) 2000 UNITS TABS Take 2,000 Units by mouth daily. Patient not taking: Reported on 12/21/2015 12/05/14   Boykin Nearing, MD  dicyclomine (BENTYL) 20 MG tablet Take 1 tablet (20 mg total) by mouth 3 (three) times daily as needed for spasms (and cramping). Patient not taking: Reported on 12/21/2015 03/28/15   Jerene Bears, MD  fluticasone [facility]) 50 MCG/ACT nasal spray Place 2 sprays into both nostrils daily. 05/09/16   Josalyn  Funches, MD  glucose blood (ACCU-CHEK AVIVA PLUS) test strip Use as instructed TID before meal Code E.11.9 10/09/15   Josalyn Funches, MD  JANUMET 50-500 MG tablet TAKE 1 TABLET BY MOUTH 2 TIMES DAILY WITH A MEAL. 06/06/16   Argentina Donovan, PA-C  lisinopril (PRINIVIL,ZESTRIL) 10 MG tablet Take 1 tablet (10 mg total) by mouth daily. 05/09/16   Josalyn Funches, MD  omeprazole (PRILOSEC) 20 MG capsule TAKE 1 CAPSULE (20 MG TOTAL) BY MOUTH 2 (TWO) TIMES DAILY. 06/05/16   Josalyn Funches, MD  terbinafine (LAMISIL) 250 MG tablet Take 1 tablet (250 mg total) by mouth daily. For 2- 6 weeks until rash heals 05/28/16   Boykin Nearing, MD  venlafaxine XR (EFFEXOR-XR) 75 MG 24 hr capsule TAKE 1 CAPSULE BY MOUTH DAILY WITH BREAKFAST 06/05/16   Boykin Nearing, MD    Family History Family History  Problem Relation Age of Onset  . Diabetes Mother   . Heart disease Mother   . Hyperlipidemia Mother   . Hypertension Mother   . Renal cancer Maternal Aunt   . Stomach cancer Maternal Uncle   . Colon cancer Neg Hx   . Esophageal cancer Neg Hx   . Rectal cancer Neg Hx     Social History Social History  Substance Use Topics  . Smoking status: Never Smoker  . Smokeless tobacco: Never Used  . Alcohol use No     Allergies   Nitroglycerin   Review of Systems Review of Systems   Physical Exam Updated Vital Signs BP 136/74 (BP Location: Right Arm)   Pulse 66   Temp 97.9 F (36.6 C) (Oral)   Resp 16   Ht _0  (1.499 m)   Wt 145 lb (65.8 kg)   SpO2 96%   BMI 29.29 kg/m   Physical Exam  Constitutional: She is oriented to person, place, and time. She appears well-developed and well-nourished. No distress.  Well appearing and well hydrated   HENT:  Head: Normocephalic and atraumatic.  Mouth/Throat: Oropharynx is clear and moist. No oropharyngeal exudate.  Eyes: Conjunctivae and EOM are normal. Pupils are equal, round, and reactive to light.  Neck: Normal range of motion. Neck supple.  No  meningismus.  Cardiovascular: Normal rate, regular rhythm, normal heart sounds and intact distal pulses.   No murmur heard. Pulmonary/Chest: Effort normal and breath sounds normal. No respiratory distress.  Abdominal: Soft. There is no tenderness. There is no rebound and no guarding.  Mild epigastric pain  Musculoskeletal: Normal range of motion. She exhibits no edema or tenderness.  Neurological: She is alert and oriented to person, place, and time. No cranial nerve deficit. She exhibits normal muscle tone. Coordination normal.   5/5 strength throughout. CN 2-12 intact.Equal grip strength.   Skin: Skin is warm.  Psychiatric: She has a normal mood and affect. Her behavior is normal.  Nursing note and vitals  reviewed.  ED Treatments / Results  DIAGNOSTIC STUDIES:  Oxygen Saturation is 96% on RA, adequate by my interpretation.    COORDINATION OF CARE:  3:09 AM Discussed treatment plan with pt at bedside and pt agreed to plan.   Labs (all labs ordered are listed, but only abnormal results are displayed) Labs Reviewed  COMPREHENSIVE METABOLIC PANEL - Abnormal; Notable for the following:       Result Value   Glucose, Bld 111 (*)    All other components within normal limits  I-STAT CHEM 8, ED - Abnormal; Notable for the following:    Chloride 98 (*)    Glucose, Bld 115 (*)    All other components within normal limits  CBC WITH DIFFERENTIAL/PLATELET  LIPASE, BLOOD  TROPONIN I    EKG  EKG Interpretation  Date/Time:  Friday June 07 2016 04:25:12 EST Ventricular Rate:  69 PR Interval:    QRS Duration: 101 QT Interval:  415 QTC Calculation: 445 R Axis:   36 Text Interpretation:  Sinus rhythm No previous ECGs available Confirmed by Wyvonnia Dusky  MD, Joie Reamer [phone]) on 06/07/2016 4:31:53 AM       Radiology Dg Chest 2 View  Result Date: 06/07/2016 CLINICAL DATA:  Chronic epigastric abdominal pain after eating. Initial encounter. EXAM: CHEST  2 VIEW COMPARISON:  Thoracic  spine radiographs performed 04/13/2015 FINDINGS: The lungs are well-aerated and clear. There is no evidence of focal opacification, pleural effusion or pneumothorax. The heart is normal in size; the mediastinal contour is within normal limits. No acute osseous abnormalities are seen. IMPRESSION: No acute cardiopulmonary process seen. Electronically Signed   By: Garald Balding M.D.   On: 06/07/2016 00:00    Procedures Procedures (including critical care time)  Medications Ordered in ED Medications - No data to display   Initial Impression / Assessment and Plan / ED Course  I have reviewed the triage vital signs and the nursing notes.  Pertinent labs & imaging results that were available during my care of the patient were reviewed by me and considered in my medical decision making (see chart for details).  Clinical Course   Patient with ongoing issues of "food getting stuck" when she eats.  Had dilation last year.  Has had issue ongoing for several months.  No vomiting.  No diarrhea.  Well appearing, well hydrated.  TTP epigastrium  Labs are reassuring. Patient is able tolerate by mouth. She is given GI cocktail.  No vomiting in the ED. She is able to drink and eat crackers. Patient able to speak and swallow without a problem. Discussed with patient she should call her GI physician to schedule another EGD and possible dilation.  This is an ongoing problem for several months and progressively worsening. Continue PPI, avoid alcohol, NSAIDs, caffeine, spicy foods. No evidence of esophageal food impaction.   Final Clinical Impressions(s) / ED Diagnoses   Final diagnoses:  Dysphagia, unspecified type    New Prescriptions New Prescriptions   No medications on file  I personally performed the services described in this documentation, which was scribed in my presence. The recorded information has been reviewed and is accurate.    Ezequiel Essex, MD 06/07/16 [phone]

## 2016-06-07 NOTE — ED Notes (Signed)
Attempted IV access with blood draw twice, without success. Having other RN take another shot before contacting IV Team.

## 2016-06-07 NOTE — ED Notes (Signed)
Pt provided crackers and water, and did well with it.

## 2016-06-17 ENCOUNTER — Encounter: Payer: Self-pay | Admitting: Physician Assistant

## 2016-06-17 ENCOUNTER — Ambulatory Visit (INDEPENDENT_AMBULATORY_CARE_PROVIDER_SITE_OTHER): Payer: Medicare Other | Admitting: Physician Assistant

## 2016-06-17 VITALS — BP 104/74 | HR 76 | Ht 59.0 in | Wt 144.6 lb

## 2016-06-17 DIAGNOSIS — K222 Esophageal obstruction: Secondary | ICD-10-CM

## 2016-06-17 DIAGNOSIS — R1319 Other dysphagia: Secondary | ICD-10-CM | POA: Diagnosis not present

## 2016-06-17 NOTE — Progress Notes (Addendum)
Subjective:    Patient ID: Amy Glass, female    DOB: Dec 14, 1960, 55 y.o.   MRN: [id number]  HPI Amy Glass is a 55 year old Hispanic female known to Dr. Hilarie Fredrickson who comes in today with complaints of significant dysphagia. She wasseen in July 2016 at which time she had EGD with finding of reflux esophagitis and a Schatzki's ring at the GE junction. This was balloon dilated to 18 mm. Also noted to have a 2 cm hiatal hernia. Prior to that she had had barium swallow which was negative and esophageal manometry was done in April 2017 and normal . Patient is very descriptive and dramatic in her donation of her symptoms. She explains that she has a very narrowed 'pipe" and that foods stack supple on top of this narrowed and has to be gradually pushed through. She has no difficulty with liquids however all solid food and pills cause problems. She says she even has difficulty at times with pured-type foods. She frequently feels that food is sitting in her esophagus for several hours prior to emptying out. She became tearful when she explained that she had eaten a cheeseburger yesterday afternoon and one is unable to sleep last night because of the sensation of food still being stuck. She finally was able to go to sleep at about 3 AM when her symptoms resolved. She has no complaints of heartburn or indigestion. She is on omeprazole 20 mg by mouth twice a day. She  says she did not get any improvement in her symptoms after balloon dilation was done and asks if there isn't something else that can be done like" replacing part of her esophagus."  Review of Systems Pertinent positive and negative review of systems were noted in the above HPI section.  All other review of systems was otherwise negative.  Outpatient Encounter Prescriptions as of 06/17/2016  Medication Sig  . ACCU-CHEK FASTCLIX LANCETS MISC 1 each by Does not apply route once. Used TID before meal /code E11.9  . atorvastatin (LIPITOR) 40 MG tablet  TAKE 1 TABLET (40 MG TOTAL) BY MOUTH DAILY.  Marland Kitchen Blood Glucose Monitoring Suppl (ACCU-CHEK AVIVA PLUS) w/Device KIT 1 each by Does not apply route once. Used TID before meal /code E11.9  . carbamide peroxide (DEBROX) 6.5 % otic solution Place 5 drops into the right ear 2 (two) times daily.  . fluticasone (FLONASE) 50 MCG/ACT nasal spray Place 2 sprays into both nostrils daily.  Marland Kitchen glucose blood (ACCU-CHEK AVIVA PLUS) test strip Use as instructed TID before meal Code E.11.9  . JANUMET 50-500 MG tablet TAKE 1 TABLET BY MOUTH 2 TIMES DAILY WITH A MEAL.  Marland Kitchen lisinopril (PRINIVIL,ZESTRIL) 10 MG tablet Take 1 tablet (10 mg total) by mouth daily.  Marland Kitchen omeprazole (PRILOSEC) 20 MG capsule TAKE 1 CAPSULE (20 MG TOTAL) BY MOUTH 2 (TWO) TIMES DAILY.  Marland Kitchen terbinafine (LAMISIL) 250 MG tablet Take 1 tablet (250 mg total) by mouth daily. For 2- 6 weeks until rash heals  . traZODone (DESYREL) 50 MG tablet Take 50 mg by mouth at bedtime.  Marland Kitchen venlafaxine XR (EFFEXOR-XR) 75 MG 24 hr capsule TAKE 1 CAPSULE BY MOUTH DAILY WITH BREAKFAST  . [DISCONTINUED] Brimonidine Tartrate 0.33 % GEL Apply 1 application topically daily. (Patient not taking: Reported on 06/07/2016)  . [DISCONTINUED] cetirizine (ZYRTEC) 10 MG tablet Take 1 tablet (10 mg total) by mouth daily.  . [DISCONTINUED] Cholecalciferol (VITAMIN D3) 2000 UNITS TABS Take 2,000 Units by mouth daily. (Patient not taking: Reported on 06/07/2016)  . [  DISCONTINUED] dicyclomine (BENTYL) 20 MG tablet Take 1 tablet (20 mg total) by mouth 3 (three) times daily as needed for spasms (and cramping). (Patient not taking: Reported on 06/07/2016)  . [DISCONTINUED] metoCLOPramide (REGLAN) 10 MG tablet Take 1 tablet (10 mg total) by mouth every 6 (six) hours.   No facility-administered encounter medications on file as of 06/17/2016.    Allergies  Allergen Reactions  . Nitroglycerin Other (See Comments)    Migraines, (only tried nitro patch. Has never tried the pills)   Patient Active  Problem List   Diagnosis Date Noted  . Depression 02/08/2016  . Scalp mass 02/08/2016  . Rosacea 02/08/2016  . Laceration of left thumb 02/08/2016  . Right shoulder pain 12/22/2015  . Cerumen impaction 12/22/2015  . Allergic sinusitis 12/22/2015  . Patient desires pregnancy 12/22/2015  . Dysphagia   . Acanthosis nigricans 09/27/2015  . Breast pain, right 09/27/2015  . TMJ pain dysfunction syndrome 01/16/2015  . Poor dentition 01/16/2015  . Lumbar degenerative disc disease 01/10/2015  . Vitamin D deficiency 12/05/2014  . High triglycerides 12/05/2014  . Diabetes type 2, controlled ([place]) 12/02/2014  . Lateral pain of right hip 12/02/2014  . Numbness and tingling in right hand 12/02/2014   Social History   Social History  . Marital status: Married    Spouse name: N/A  . Number of children: N/A  . Years of education: N/A   Occupational History  . Cashier    Social History Main Topics  . Smoking status: Never Smoker  . Smokeless tobacco: Never Used  . Alcohol use No  . Drug use: No  . Sexual activity: Not on file   Other Topics Concern  . Not on file   Social History Narrative  . No narrative on file    Amy Glass's family history includes Diabetes in her mother; Heart disease in her mother; Hyperlipidemia in her mother; Hypertension in her mother; Renal cancer in her maternal aunt; Stomach cancer in her maternal uncle.      Objective:    Vitals:   06/17/16 1116  BP: 104/74  Pulse: 76    Physical Exam  well-developed Hispanic female in no acute dis;tress, blood pressure 104/74 pulse 76 height 4 foot 11 weight 144 BMI 29.21. HEENT nontraumatic normocephalic EOMI PERRLA sclera anicteric, Cardiovascular ;regular rate and rhythm with S1-S2 no murmur or gallop, Pulmonary ;clear bilaterally, Abdomen soft, bowel sounds are present no palpable mass or hepatosplenomegaly, Extremities ;no clubbing cyanosis or edema skin warm and dry, Neuropsych; mood and affect  appropriate       Assessment & Plan:   #77 55 year old Hispanic female with previously documented Schatzki's ring at the GE junction status post balloon dilation to 18 mm July 2016 with no improvement in dysphagia who comes in today with persistent complaint of solid food and pill dysphagia over the past year and a half which is occurring on a daily basis Prior barium swallow negative and esophageal manometry normal  #2 history of adenomatous colon polyps, last colonoscopy July 2016 2 hyperplastic polyps removed, patient buys to have 5 year interval follow-up #3 diverticulosis #4 adult onset diabetes mellitus    Plan; continue omeprazole 1 mg by mouth twice daily Patient will be scheduled for EGD with repeat balloon dilation with Dr. Hilarie Fredrickson. Procedure discussed at length with the patient including risks and benefits and she is agreeable to proceed.   Ridley Dileo S Benjaman Artman PA-C 06/17/2016   Cc: Boykin Nearing, MD  Addendum: Reviewed and agree  with management. Jerene Bears, MD

## 2016-06-17 NOTE — Patient Instructions (Signed)

## 2016-06-20 DIAGNOSIS — H02834 Dermatochalasis of left upper eyelid: Secondary | ICD-10-CM | POA: Diagnosis not present

## 2016-06-20 DIAGNOSIS — R1310 Dysphagia, unspecified: Secondary | ICD-10-CM | POA: Diagnosis not present

## 2016-06-20 DIAGNOSIS — Z79899 Other long term (current) drug therapy: Secondary | ICD-10-CM | POA: Diagnosis not present

## 2016-06-20 DIAGNOSIS — H02831 Dermatochalasis of right upper eyelid: Secondary | ICD-10-CM | POA: Diagnosis not present

## 2016-06-20 DIAGNOSIS — H02403 Unspecified ptosis of bilateral eyelids: Secondary | ICD-10-CM | POA: Diagnosis not present

## 2016-06-20 DIAGNOSIS — E119 Type 2 diabetes mellitus without complications: Secondary | ICD-10-CM | POA: Diagnosis not present

## 2016-06-21 ENCOUNTER — Ambulatory Visit: Payer: Medicare Other | Admitting: Internal Medicine

## 2016-06-21 HISTORY — PX: CARPAL TUNNEL RELEASE: SHX101

## 2016-06-24 DIAGNOSIS — H01021 Squamous blepharitis right upper eyelid: Secondary | ICD-10-CM | POA: Diagnosis not present

## 2016-06-24 DIAGNOSIS — H01024 Squamous blepharitis left upper eyelid: Secondary | ICD-10-CM | POA: Diagnosis not present

## 2016-06-24 DIAGNOSIS — H25013 Cortical age-related cataract, bilateral: Secondary | ICD-10-CM | POA: Diagnosis not present

## 2016-06-24 DIAGNOSIS — H01025 Squamous blepharitis left lower eyelid: Secondary | ICD-10-CM | POA: Diagnosis not present

## 2016-06-24 DIAGNOSIS — E119 Type 2 diabetes mellitus without complications: Secondary | ICD-10-CM | POA: Diagnosis not present

## 2016-06-24 DIAGNOSIS — H02423 Myogenic ptosis of bilateral eyelids: Secondary | ICD-10-CM | POA: Diagnosis not present

## 2016-06-24 DIAGNOSIS — H02834 Dermatochalasis of left upper eyelid: Secondary | ICD-10-CM | POA: Diagnosis not present

## 2016-06-24 DIAGNOSIS — H02831 Dermatochalasis of right upper eyelid: Secondary | ICD-10-CM | POA: Diagnosis not present

## 2016-06-24 DIAGNOSIS — H01022 Squamous blepharitis right lower eyelid: Secondary | ICD-10-CM | POA: Diagnosis not present

## 2016-06-27 DIAGNOSIS — F319 Bipolar disorder, unspecified: Secondary | ICD-10-CM | POA: Diagnosis not present

## 2016-07-03 DIAGNOSIS — Z79891 Long term (current) use of opiate analgesic: Secondary | ICD-10-CM | POA: Diagnosis not present

## 2016-07-03 DIAGNOSIS — G5601 Carpal tunnel syndrome, right upper limb: Secondary | ICD-10-CM | POA: Diagnosis not present

## 2016-07-03 DIAGNOSIS — M25511 Pain in right shoulder: Secondary | ICD-10-CM | POA: Diagnosis not present

## 2016-07-03 DIAGNOSIS — G5602 Carpal tunnel syndrome, left upper limb: Secondary | ICD-10-CM | POA: Diagnosis not present

## 2016-07-03 DIAGNOSIS — M47816 Spondylosis without myelopathy or radiculopathy, lumbar region: Secondary | ICD-10-CM | POA: Diagnosis not present

## 2016-07-03 DIAGNOSIS — M5136 Other intervertebral disc degeneration, lumbar region: Secondary | ICD-10-CM | POA: Diagnosis not present

## 2016-07-04 ENCOUNTER — Other Ambulatory Visit: Payer: Self-pay | Admitting: Family Medicine

## 2016-07-04 DIAGNOSIS — J309 Allergic rhinitis, unspecified: Secondary | ICD-10-CM

## 2016-07-05 DIAGNOSIS — G5602 Carpal tunnel syndrome, left upper limb: Secondary | ICD-10-CM | POA: Diagnosis not present

## 2016-07-08 ENCOUNTER — Encounter (HOSPITAL_BASED_OUTPATIENT_CLINIC_OR_DEPARTMENT_OTHER): Payer: Self-pay

## 2016-07-08 ENCOUNTER — Ambulatory Visit (HOSPITAL_BASED_OUTPATIENT_CLINIC_OR_DEPARTMENT_OTHER): Admit: 2016-07-08 | Payer: Medicare Other | Admitting: Plastic Surgery

## 2016-07-08 SURGERY — REPAIR, BLEPHAROPTOSIS
Anesthesia: General | Laterality: Bilateral

## 2016-07-09 ENCOUNTER — Encounter: Payer: Self-pay | Admitting: Internal Medicine

## 2016-07-09 ENCOUNTER — Ambulatory Visit (AMBULATORY_SURGERY_CENTER): Payer: Medicare Other | Admitting: Internal Medicine

## 2016-07-09 VITALS — BP 130/70 | HR 70 | Temp 97.7°F | Resp 7 | Ht 59.0 in | Wt 144.0 lb

## 2016-07-09 DIAGNOSIS — E669 Obesity, unspecified: Secondary | ICD-10-CM | POA: Diagnosis not present

## 2016-07-09 DIAGNOSIS — K219 Gastro-esophageal reflux disease without esophagitis: Secondary | ICD-10-CM | POA: Diagnosis not present

## 2016-07-09 DIAGNOSIS — I10 Essential (primary) hypertension: Secondary | ICD-10-CM | POA: Diagnosis not present

## 2016-07-09 DIAGNOSIS — E119 Type 2 diabetes mellitus without complications: Secondary | ICD-10-CM | POA: Diagnosis not present

## 2016-07-09 DIAGNOSIS — R1310 Dysphagia, unspecified: Secondary | ICD-10-CM | POA: Diagnosis not present

## 2016-07-09 LAB — GLUCOSE, CAPILLARY
GLUCOSE-CAPILLARY: 95 mg/dL (ref 65–99)
GLUCOSE-CAPILLARY: 89 mg/dL (ref 65–99)

## 2016-07-09 MED ORDER — SODIUM CHLORIDE 0.9 % IV SOLN: 500.0000 mL | INTRAVENOUS | Status: DC

## 2016-07-09 NOTE — Progress Notes (Signed)
Called to room to assist during endoscopic procedure.  Patient ID and intended procedure confirmed with present staff. Received instructions for my participation in the procedure from the performing physician.  

## 2016-07-09 NOTE — Op Note (Signed)
[facility] Patient Name: Amy Glass Procedure Date: 07/09/2016 1:58 PM MRN: [id number] Endoscopist: Jerene Bears , MD Age: 55 Referring MD:  Date of Birth: 1961-05-28 Gender: Female Account #: [id number] Procedure:                Upper GI endoscopy Indications:              Dysphagia; prior normal esophagram, prior normal                            manometry Medicines:                Monitored Anesthesia Care Procedure:                Pre-Anesthesia Assessment:                           - Prior to the procedure, a History and Physical                            was performed, and patient medications and                            allergies were reviewed. The patient's tolerance of                            previous anesthesia was also reviewed. The risks                            and benefits of the procedure and the sedation                            options and risks were discussed with the patient.                            All questions were answered, and informed consent                            was obtained. Prior Anticoagulants: The patient has                            taken no previous anticoagulant or antiplatelet                            agents. ASA Grade Assessment: II - A patient with                            mild systemic disease. After reviewing the risks                            and benefits, the patient was deemed in                            satisfactory condition to undergo the procedure.  After obtaining informed consent, the endoscope was                            passed under direct vision. Throughout the                            procedure, the patient's blood pressure, pulse, and                            oxygen saturations were monitored continuously. The                            Model GIF-[id number]) scope was introduced                            through the mouth, and advanced to the second  part                            of duodenum. The upper GI endoscopy was                            accomplished without difficulty. The patient                            tolerated the procedure well. Scope In: Scope Out: Findings:                 Normal mucosa was found in the entire esophagus.                           A 2 cm hiatal hernia was present.                           A TTS dilator was passed through the scope.                            Dilation with a 16-17-18 mm balloon dilator was                            performed to 18 mm in the lower third of the                            esophagus across the GE junction.                           The entire examined stomach was normal.                           The cardia and gastric fundus were normal on                            retroflexion.                           The examined duodenum was normal. Complications:  No immediate complications. Estimated Blood Loss:     Estimated blood loss: none. Impression:               - Normal mucosa was found in the entire esophagus.                           - 2 cm hiatal hernia.                           - Dilation performed in the lower third of the                            esophagus across the GE junction to 18 mm.                           - Normal stomach.                           - Normal examined duodenum.                           - No specimens collected. Recommendation:           - Patient has a contact number available for                            emergencies. The signs and symptoms of potential                            delayed complications were discussed with the                            patient. Return to normal activities tomorrow.                            Written discharge instructions were provided to the                            patient.                           - Resume previous diet.                           - Continue present medications.                            - If dysphagia symptoms persist then recommend                            referral to [facility]. Jerene Bears, MD 07/09/2016 2:27:58 PM This report has been signed electronically.

## 2016-07-09 NOTE — Progress Notes (Signed)
Report to PACU, RN, vss, BBS= Clear.  

## 2016-07-09 NOTE — Patient Instructions (Signed)
YOU HAD AN ENDOSCOPIC PROCEDURE TODAY AT [facility]:   Refer to the procedure report that was given to you for any specific questions about what was found during the examination.  If the procedure report does not answer your questions, please call your gastroenterologist to clarify.  If you requested that your care partner not be given the details of your procedure findings, then the procedure report has been included in a sealed envelope for you to review at your convenience later.  YOU SHOULD EXPECT: Some feelings of bloating in the abdomen. Passage of more gas than usual.  Walking can help get rid of the air that was put into your GI tract during the procedure and reduce the bloating. If you had a lower endoscopy (such as a colonoscopy or flexible sigmoidoscopy) you may notice spotting of blood in your stool or on the toilet paper. If you underwent a bowel prep for your procedure, you may not have a normal bowel movement for a few days.  Please Note:  You might notice some irritation and congestion in your nose or some drainage.  This is from the oxygen used during your procedure.  There is no need for concern and it should clear up in a day or so.  SYMPTOMS TO REPORT IMMEDIATELY:     Following upper endoscopy (EGD)  Vomiting of blood or coffee ground material  New chest pain or pain under the shoulder blades  Painful or persistently difficult swallowing  New shortness of breath  Fever of 100F or higher  Black, tarry-looking stools  For urgent or emergent issues, a gastroenterologist can be reached at any hour by calling [phone].   DIET:  Follow dilatation diet given to you today   ACTIVITY:  You should plan to take it easy for the rest of today and you should NOT DRIVE or use heavy machinery until tomorrow (because of the sedation medicines used during the test).    FOLLOW UP: Our staff will call the number listed on your records the next business day  following your procedure to check on you and address any questions or concerns that you may have regarding the information given to you following your procedure. If we do not reach you, we will leave a message.  However, if you are feeling well and you are not experiencing any problems, there is no need to return our call.  We will assume that you have returned to your regular daily activities without incident.  If any biopsies were taken you will be contacted by phone or by letter within the next 1-3 weeks.  Please call us at [phone] if you have not heard about the biopsies in 3 weeks.    SIGNATURES/CONFIDENTIALITY: You and/or your care partner have signed paperwork which will be entered into your electronic medical record.  These signatures attest to the fact that that the information above on your After Visit Summary has been reviewed and is understood.  Full responsibility of the confidentiality of this discharge information lies with you and/or your care-partner.

## 2016-07-10 ENCOUNTER — Telehealth: Payer: Self-pay | Admitting: *Deleted

## 2016-07-10 NOTE — Telephone Encounter (Signed)
  Follow up Call-  Call back number 07/09/2016 01/24/2015  Post procedure Call Back phone  # [phone] hm  Permission to leave phone message Yes Yes  Some recent data might be hidden     Patient questions:  Do you have a fever, pain , or abdominal swelling? No. Pain Score  0 *  Have you tolerated food without any problems? Yes.    Have you been able to return to your normal activities? Yes.    Do you have any questions about your discharge instructions: Diet   No. Medications  No. Follow up visit  No.  Do you have questions or concerns about your Care? No.  Actions: * If pain score is 4 or above: No action needed, pain <4.

## 2016-07-11 ENCOUNTER — Other Ambulatory Visit: Payer: Self-pay | Admitting: Family Medicine

## 2016-07-11 DIAGNOSIS — B356 Tinea cruris: Secondary | ICD-10-CM

## 2016-07-24 ENCOUNTER — Telehealth: Payer: Self-pay | Admitting: Internal Medicine

## 2016-07-24 NOTE — Telephone Encounter (Signed)
Pt states she is having difficulty swallowing, that the procedure "didn't work." Pt is also complaining of nausea and would like something called in for nausea. Please advise.

## 2016-07-24 NOTE — Telephone Encounter (Signed)
Referral to [facility] for dysphagia despite empiric dilation, normal mano, neg EoE eval, and normal esophagram While referral pending - can use zofran 4 mg TIDPRN for nausea

## 2016-07-24 NOTE — Telephone Encounter (Signed)
Called back to ask for a script for nausea medicine. Uses [facility] at [place]

## 2016-07-25 ENCOUNTER — Other Ambulatory Visit: Payer: Self-pay

## 2016-07-25 MED ORDER — ONDANSETRON HCL 4 MG PO TABS
4.0000 mg | ORAL_TABLET | Freq: Three times a day (TID) | ORAL | 0 refills | Status: DC | PRN
Start: 2016-07-25 — End: 2016-12-26

## 2016-07-25 NOTE — Telephone Encounter (Signed)
Referral made to [name], script sent to pharmacy, patient aware.

## 2016-07-30 ENCOUNTER — Other Ambulatory Visit: Payer: Self-pay | Admitting: Family Medicine

## 2016-08-03 ENCOUNTER — Other Ambulatory Visit: Payer: Self-pay | Admitting: Family Medicine

## 2016-08-15 ENCOUNTER — Telehealth: Payer: Self-pay | Admitting: Internal Medicine

## 2016-08-16 NOTE — Telephone Encounter (Signed)
According to notes patient was referred on January 3rd.  She is notified that she should hear back in the next few weeks from [facility]

## 2016-08-21 DIAGNOSIS — F319 Bipolar disorder, unspecified: Secondary | ICD-10-CM | POA: Diagnosis not present

## 2016-08-24 ENCOUNTER — Other Ambulatory Visit: Payer: Self-pay | Admitting: Family Medicine

## 2016-08-24 DIAGNOSIS — F329 Major depressive disorder, single episode, unspecified: Secondary | ICD-10-CM

## 2016-08-24 DIAGNOSIS — F32A Depression, unspecified: Secondary | ICD-10-CM

## 2016-08-29 DIAGNOSIS — H02831 Dermatochalasis of right upper eyelid: Secondary | ICD-10-CM | POA: Diagnosis not present

## 2016-08-29 DIAGNOSIS — H02834 Dermatochalasis of left upper eyelid: Secondary | ICD-10-CM | POA: Diagnosis not present

## 2016-08-29 NOTE — H&P (Signed)
Subjective:     Patient ID: Amy Glass is a 56 y.o. female.  Pre-op Exam   Here for discussion prior to planned upper blepharoplasty and ptosis correction bilateral.    Patient reports this as several year problem, feels heavy, feels unable to see well, worse at end of day. No corrective lenses or contacts. Denies dry eyes, reports occasional tearing for which she will use OTC allergy medication. She underwent VFE testing and this showed bilateral VFE with >20 degree obstruction that resolved with taping.   PMH significant for DM, last HbA1c 6.1 Also consultation at [facility] for esophagus. History of dyspahgia long standing, not relieved by balloon dilation. Has history Schatzki ring and hiatal hernia. Previous anesthesia no problems.   Review of Systems     Objective:   Physical Exam  Cardiovascular: Normal rate, regular rhythm and normal heart sounds.   Pulmonary/Chest: Effort normal.  Skin:  Fitzpatrick 4  HEENT: EOMI, - Bells,  With frontalis blocked, Bilateral dermatochalasia with < 1 mm pretarsal show, bilateral ptosis 2-3 mm present, levator excursion 12 mm bilateral, brows at supraorbital rim  Patient tilts head back as compensatory mechanism     Assessment:     Ptosis eyelids Dermatochalasia    Plan:     Reviewed has both ptosis and dermatochalasia, the former more severe and cause of symptoms. Plan upper blepharoplasty with ptosis repair. Reviewed levator plication or advancement with resection redundant skin. Reviewed bruising, sutures, scar maturation over months, risk hyper and hypopigmentation scars, changes with aging and need for additional surgery. Discussed brows will descend over time and could cause similar symptoms, affect cosmetic result. Reviewed post procedure visits and limitations, expected time off work. Reviewed risk of asymmetry and need for revision greater with ptosis surgeries. Additional risks including blindness, DVT/PE, cardiopulmonary  complications, unacceptable cosmetic result discussed.  No ASA or NSAIDS for week prior.  New pictures today. Patient has Norco she uses chronically and no additional Rx given today. Letter provided for off work from surgery through September 27 2016     Irene Limbo, MD [facility] [phone], pin [phone]

## 2016-09-11 ENCOUNTER — Encounter (HOSPITAL_BASED_OUTPATIENT_CLINIC_OR_DEPARTMENT_OTHER): Payer: Self-pay | Admitting: *Deleted

## 2016-09-13 ENCOUNTER — Encounter (HOSPITAL_BASED_OUTPATIENT_CLINIC_OR_DEPARTMENT_OTHER)
Admission: RE | Admit: 2016-09-13 | Discharge: 2016-09-13 | Disposition: A | Discharge status: Home / Self Care | Payer: Medicare Other | Source: Ambulatory Visit | Attending: Plastic Surgery | Admitting: Plastic Surgery

## 2016-09-13 DIAGNOSIS — H02836 Dermatochalasis of left eye, unspecified eyelid: Secondary | ICD-10-CM | POA: Insufficient documentation

## 2016-09-13 DIAGNOSIS — H02403 Unspecified ptosis of bilateral eyelids: Secondary | ICD-10-CM | POA: Diagnosis not present

## 2016-09-13 DIAGNOSIS — H02833 Dermatochalasis of right eye, unspecified eyelid: Secondary | ICD-10-CM | POA: Insufficient documentation

## 2016-09-13 LAB — BASIC METABOLIC PANEL
Anion gap: 11 (ref 5–15)
BUN: 7 mg/dL (ref 6–20)
CO2: 28 mmol/L (ref 22–32)
Calcium: 9.8 mg/dL (ref 8.9–10.3)
Chloride: 99 mmol/L — ABNORMAL LOW (ref 101–111)
Creatinine, Ser: 0.59 mg/dL (ref 0.44–1.00)
GFR calc Af Amer: >60 mL/min (ref >60)
Glucose, Bld: 97 mg/dL (ref 65–99)
POTASSIUM: 4.4 mmol/L (ref 3.5–5.1)
Sodium: 138 mmol/L (ref 135–145)

## 2016-09-16 ENCOUNTER — Other Ambulatory Visit: Payer: Self-pay | Admitting: Physician Assistant

## 2016-09-17 ENCOUNTER — Ambulatory Visit (HOSPITAL_BASED_OUTPATIENT_CLINIC_OR_DEPARTMENT_OTHER)
Admission: RE | Admit: 2016-09-17 | Discharge: 2016-09-17 | Disposition: A | Discharge status: Home / Self Care | Payer: Medicare Other | Source: Ambulatory Visit | Attending: Plastic Surgery | Admitting: Plastic Surgery

## 2016-09-17 ENCOUNTER — Encounter (HOSPITAL_BASED_OUTPATIENT_CLINIC_OR_DEPARTMENT_OTHER): Payer: Self-pay | Admitting: Anesthesiology

## 2016-09-17 ENCOUNTER — Ambulatory Visit (HOSPITAL_BASED_OUTPATIENT_CLINIC_OR_DEPARTMENT_OTHER): Payer: Medicare Other | Admitting: Anesthesiology

## 2016-09-17 ENCOUNTER — Encounter (HOSPITAL_BASED_OUTPATIENT_CLINIC_OR_DEPARTMENT_OTHER)
Admission: RE | Disposition: A | Discharge status: Home / Self Care | Payer: Self-pay | Source: Ambulatory Visit | Attending: Plastic Surgery

## 2016-09-17 DIAGNOSIS — K449 Diaphragmatic hernia without obstruction or gangrene: Secondary | ICD-10-CM | POA: Insufficient documentation

## 2016-09-17 DIAGNOSIS — Z01818 Encounter for other preprocedural examination: Secondary | ICD-10-CM | POA: Diagnosis not present

## 2016-09-17 DIAGNOSIS — H02403 Unspecified ptosis of bilateral eyelids: Secondary | ICD-10-CM | POA: Insufficient documentation

## 2016-09-17 DIAGNOSIS — H02831 Dermatochalasis of right upper eyelid: Secondary | ICD-10-CM | POA: Diagnosis not present

## 2016-09-17 DIAGNOSIS — H02834 Dermatochalasis of left upper eyelid: Secondary | ICD-10-CM | POA: Diagnosis not present

## 2016-09-17 DIAGNOSIS — E119 Type 2 diabetes mellitus without complications: Secondary | ICD-10-CM | POA: Insufficient documentation

## 2016-09-17 DIAGNOSIS — H02839 Dermatochalasis of unspecified eye, unspecified eyelid: Secondary | ICD-10-CM | POA: Diagnosis not present

## 2016-09-17 DIAGNOSIS — H5347 Heteronymous bilateral field defects: Secondary | ICD-10-CM | POA: Diagnosis not present

## 2016-09-17 DIAGNOSIS — R1310 Dysphagia, unspecified: Secondary | ICD-10-CM | POA: Diagnosis not present

## 2016-09-17 DIAGNOSIS — F329 Major depressive disorder, single episode, unspecified: Secondary | ICD-10-CM | POA: Diagnosis not present

## 2016-09-17 HISTORY — PX: PTOSIS REPAIR: SHX6568

## 2016-09-17 HISTORY — PX: BROW LIFT: SHX178

## 2016-09-17 LAB — GLUCOSE, CAPILLARY
Glucose-Capillary: 92 mg/dL (ref 65–99)
Glucose-Capillary: 89 mg/dL (ref 65–99)

## 2016-09-17 LAB — POCT HEMOGLOBIN-HEMACUE: HEMOGLOBIN: 13 g/dL (ref 12.0–15.0)

## 2016-09-17 SURGERY — REPAIR, BLEPHAROPTOSIS
Anesthesia: General | Laterality: Bilateral

## 2016-09-17 MED ORDER — LACTATED RINGERS IV SOLN
INTRAVENOUS | Status: DC
  Administered 2016-09-17 (×2): via INTRAVENOUS

## 2016-09-17 MED ORDER — FENTANYL CITRATE (PF) 100 MCG/2ML IJ SOLN: 50.0000 ug | INTRAMUSCULAR | Status: DC | PRN

## 2016-09-17 MED ORDER — HYDROMORPHONE HCL 1 MG/ML IJ SOLN
0.2500 mg | INTRAMUSCULAR | Status: DC | PRN
  Administered 2016-09-17 (×2): 0.25 mg via INTRAVENOUS
  Administered 2016-09-17 (×2): 0.5 mg via INTRAVENOUS

## 2016-09-17 MED ORDER — BACITRACIN-POLYMYXIN B 500-10000 UNIT/GM OP OINT
TOPICAL_OINTMENT | OPHTHALMIC | Status: AC
  Filled 2016-09-17: qty 3.5

## 2016-09-17 MED ORDER — MIDAZOLAM HCL 2 MG/2ML IJ SOLN: 1.0000 mg | INTRAMUSCULAR | Status: DC | PRN

## 2016-09-17 MED ORDER — SCOPOLAMINE 1 MG/3DAYS TD PT72: 1.0000 | MEDICATED_PATCH | Freq: Once | TRANSDERMAL | Status: DC | PRN

## 2016-09-17 MED ORDER — DEXAMETHASONE SODIUM PHOSPHATE 10 MG/ML IJ SOLN
INTRAMUSCULAR | Status: DC | PRN
  Administered 2016-09-17: 10 mg via INTRAVENOUS

## 2016-09-17 MED ORDER — BSS IO SOLN
INTRAOCULAR | Status: AC
  Filled 2016-09-17: qty 15

## 2016-09-17 MED ORDER — LIDOCAINE 2% (20 MG/ML) 5 ML SYRINGE
INTRAMUSCULAR | Status: AC
  Filled 2016-09-17: qty 5

## 2016-09-17 MED ORDER — CEFAZOLIN SODIUM-DEXTROSE 2-4 GM/100ML-% IV SOLN
INTRAVENOUS | Status: AC
Start: 2016-09-17 — End: 2016-09-17
  Filled 2016-09-17: qty 100

## 2016-09-17 MED ORDER — ROCURONIUM BROMIDE 50 MG/5ML IV SOSY
PREFILLED_SYRINGE | INTRAVENOUS | Status: AC
  Filled 2016-09-17: qty 5

## 2016-09-17 MED ORDER — LACTATED RINGERS IV SOLN: INTRAVENOUS | Status: DC | PRN

## 2016-09-17 MED ORDER — MIDAZOLAM HCL 2 MG/2ML IJ SOLN
INTRAMUSCULAR | Status: AC
  Filled 2016-09-17: qty 2

## 2016-09-17 MED ORDER — HYDROMORPHONE HCL 1 MG/ML IJ SOLN
INTRAMUSCULAR | Status: AC
  Filled 2016-09-17: qty 1

## 2016-09-17 MED ORDER — ONDANSETRON HCL 4 MG/2ML IJ SOLN
INTRAMUSCULAR | Status: AC
Start: 2016-09-17 — End: 2016-09-17
  Filled 2016-09-17: qty 2

## 2016-09-17 MED ORDER — LIDOCAINE-EPINEPHRINE 2 %-1:100000 IJ SOLN
INTRAMUSCULAR | Status: DC | PRN
  Administered 2016-09-17: 2.5 mL via INTRADERMAL

## 2016-09-17 MED ORDER — MIDAZOLAM HCL 2 MG/2ML IJ SOLN
INTRAMUSCULAR | Status: DC | PRN
  Administered 2016-09-17: 2 mg via INTRAVENOUS

## 2016-09-17 MED ORDER — CEFAZOLIN SODIUM-DEXTROSE 2-4 GM/100ML-% IV SOLN
2.0000 g | INTRAVENOUS | Status: AC
  Administered 2016-09-17: 2 g via INTRAVENOUS

## 2016-09-17 MED ORDER — PHENYLEPHRINE 40 MCG/ML (10ML) SYRINGE FOR IV PUSH (FOR BLOOD PRESSURE SUPPORT)
PREFILLED_SYRINGE | INTRAVENOUS | Status: DC | PRN
  Administered 2016-09-17: 80 ug via INTRAVENOUS

## 2016-09-17 MED ORDER — PROPOFOL 10 MG/ML IV BOLUS
INTRAVENOUS | Status: AC
  Filled 2016-09-17: qty 20

## 2016-09-17 MED ORDER — FENTANYL CITRATE (PF) 100 MCG/2ML IJ SOLN
INTRAMUSCULAR | Status: AC
  Filled 2016-09-17: qty 2

## 2016-09-17 MED ORDER — ONDANSETRON HCL 4 MG/2ML IJ SOLN
INTRAMUSCULAR | Status: DC | PRN
  Administered 2016-09-17: 4 mg via INTRAVENOUS

## 2016-09-17 MED ORDER — FENTANYL CITRATE (PF) 100 MCG/2ML IJ SOLN
INTRAMUSCULAR | Status: AC
Start: 2016-09-17 — End: 2016-09-17
  Filled 2016-09-17: qty 2

## 2016-09-17 MED ORDER — BSS IO SOLN
INTRAOCULAR | Status: DC | PRN
  Administered 2016-09-17: 15 mL

## 2016-09-17 MED ORDER — PROPOFOL 10 MG/ML IV BOLUS
INTRAVENOUS | Status: DC | PRN
  Administered 2016-09-17: 180 mg via INTRAVENOUS

## 2016-09-17 MED ORDER — DEXAMETHASONE SODIUM PHOSPHATE 10 MG/ML IJ SOLN
INTRAMUSCULAR | Status: AC
  Filled 2016-09-17: qty 1

## 2016-09-17 MED ORDER — FENTANYL CITRATE (PF) 100 MCG/2ML IJ SOLN
INTRAMUSCULAR | Status: DC | PRN
  Administered 2016-09-17 (×2): 50 ug via INTRAVENOUS

## 2016-09-17 MED ORDER — LIDOCAINE HCL (CARDIAC) 20 MG/ML IV SOLN
INTRAVENOUS | Status: DC | PRN
  Administered 2016-09-17: 50 mg via INTRAVENOUS

## 2016-09-17 MED ORDER — GLYCOPYRROLATE 0.2 MG/ML IJ SOLN
INTRAMUSCULAR | Status: DC | PRN
  Administered 2016-09-17: 0.2 mg via INTRAVENOUS

## 2016-09-17 SURGICAL SUPPLY — 40 items
APPLICATOR COTTON TIP 6IN STRL (MISCELLANEOUS) IMPLANT
APPLICATOR DR MATTHEWS STRL (MISCELLANEOUS) ×3 IMPLANT
BANDAGE EYE OVAL (MISCELLANEOUS) IMPLANT
BLADE SURG 15 STRL LF DISP TIS (BLADE) ×2 IMPLANT
BLADE SURG 15 STRL SS (BLADE) ×4
CLOSURE WOUND 1/2 X4 (GAUZE/BANDAGES/DRESSINGS) ×1
CORDS BIPOLAR (ELECTRODE) IMPLANT
COVER BACK TABLE 60X90IN (DRAPES) ×3 IMPLANT
COVER MAYO STAND STRL (DRAPES) ×3 IMPLANT
DECANTER SPIKE VIAL GLASS SM (MISCELLANEOUS) IMPLANT
DRAPE U-SHAPE 76X120 STRL (DRAPES) ×3 IMPLANT
ELECT NEEDLE BLADE 2-5/6 (NEEDLE) ×3 IMPLANT
ELECT REM PT RETURN 9FT ADLT (ELECTROSURGICAL) ×3
ELECTRODE REM PT RTRN 9FT ADLT (ELECTROSURGICAL) ×1 IMPLANT
GLOVE BIO SURGEON STRL SZ 6 (GLOVE) ×3 IMPLANT
GOWN STRL REUS W/ TWL LRG LVL3 (GOWN DISPOSABLE) ×2 IMPLANT
GOWN STRL REUS W/TWL LRG LVL3 (GOWN DISPOSABLE) ×4
LIQUID BAND (GAUZE/BANDAGES/DRESSINGS) IMPLANT
NEEDLE HYPO 30GX1 BEV (NEEDLE) ×3 IMPLANT
NEEDLE PRECISIONGLIDE 27X1.5 (NEEDLE) IMPLANT
PACK BASIN DAY SURGERY FS (CUSTOM PROCEDURE TRAY) ×3 IMPLANT
PENCIL BUTTON HOLSTER BLD 10FT (ELECTRODE) ×3 IMPLANT
SHEILD EYE MED CORNL SHD 22X21 (OPHTHALMIC RELATED) ×6
SHIELD EYE LENSE ONLY DISP (MISCELLANEOUS) IMPLANT
SHIELD EYE MED CORNL SHD 22X21 (OPHTHALMIC RELATED) ×2 IMPLANT
SLEEVE SCD COMPRESS KNEE MED (MISCELLANEOUS) ×3 IMPLANT
STAPLER VISISTAT 35W (STAPLE) ×3 IMPLANT
STRIP CLOSURE SKIN 1/2X4 (GAUZE/BANDAGES/DRESSINGS) ×2 IMPLANT
STRIP SUTURE WOUND CLOSURE 1/2 (SUTURE) IMPLANT
SUT MERSILENE 5 0 P 3 (SUTURE) IMPLANT
SUT MERSILENE 5-0 (SUTURE) IMPLANT
SUT MNCRL 6-0 UNDY P1 1X18 (SUTURE) IMPLANT
SUT MONOCRYL 6-0 P1 1X18 (SUTURE)
SUT PLAIN 5 0 P 3 18 (SUTURE) ×3 IMPLANT
SUT PROLENE 6 0 P 1 18 (SUTURE) ×6 IMPLANT
SUT VIC AB 5-0 P-3 18X BRD (SUTURE) ×2 IMPLANT
SUT VIC AB 5-0 P3 18 (SUTURE) ×4
SYR CONTROL 10ML LL (SYRINGE) ×3 IMPLANT
TOWEL OR 17X24 6PK STRL BLUE (TOWEL DISPOSABLE) ×6 IMPLANT
TRAY DSU PREP LF (CUSTOM PROCEDURE TRAY) ×3 IMPLANT

## 2016-09-17 NOTE — Discharge Instructions (Signed)

## 2016-09-17 NOTE — Transfer of Care (Signed)
Immediate Anesthesia Transfer of Care Note  Patient: Amy Glass  Procedure(s) Performed: Procedure(s): BILATERAL PTOSIS REPAIR EYELID WITH SUTURE TECHNIQUE, BILATERAL UPPER LID BLEPHAROPLASTY WITH EXCESS SKIN WEIGHING EYELID DOWN. (Bilateral) BLEPHAROPLASTY (Bilateral)  Patient Location: PACU  Anesthesia Type:General  Level of Consciousness: awake, alert  and oriented  Airway & Oxygen Therapy: Patient Spontanous Breathing and Patient connected to face mask oxygen  Post-op Assessment: Report given to RN and Post -op Vital signs reviewed and stable  Post vital signs: Reviewed and stable  Last Vitals:  Vitals:   09/17/16 0943 09/17/16 1156  BP:  (!) 142/98  Pulse: 68 90  Resp: 16 13  Temp: 36.9 C (P) 36.4 C    Last Pain:  Vitals:   09/17/16 0943  TempSrc: Oral  PainSc: 0-No pain         Complications: No apparent anesthesia complications

## 2016-09-17 NOTE — Op Note (Signed)
Operative Note   DATE OF OPERATION: 2.27.18  LOCATION: [facility]-outpatient  SURGICAL DIVISION: Plastic Surgery  PREOPERATIVE DIAGNOSES:  1. Dermatochalasia 2. Upper eyelid ptosis bilateral 3. Obstructed visual fields  POSTOPERATIVE DIAGNOSES:  same  PROCEDURE:  1. Bilateral ptosis repair with suture technique 2. Bilateral upper blepharoplasty with excess skin weighing eyelid down  SURGEON: Irene Limbo MD MBA  ASSISTANT: none  ANESTHESIA:  General.   EBL: minimal  COMPLICATIONS: None immediate.   INDICATIONS FOR PROCEDURE:  The patient, Amy Glass, is a 56 y.o. female born on Feb 23, 1961, is here for blepharoplasty with ptosis repair in setting normal levator muscle function and documented visual field obstruction.   FINDINGS: Levator plication performed bilateral.  DESCRIPTION OF PROCEDURE:  The patient was taken to the operating room. SCDs were placed and IV antibiotics were given. Corneal shields and ophthalmic lubricant were placed bilateral. The patient's operative site was prepped and draped in a sterile fashion. A time out was performed and all information was confirmed to be correct. Local anesthetic was infiltrated to perform bilateral supraorbital nerve blocks and within upper eyelid areas marked for resection.Caudal skin incision marked at 7 mm from lash line at level of pupil and lateral canthus bilateral. Superior extent resection skin marked at junction eyelid and brow skin. Care taken to limit skin resection to 5 mm skin medially. I began on right. Skin excised and orbicularis divided superior tarsal plate and anterior surface tarsal plate exposed. Dissection completed superiorly beneath orbicularis oculi to expose levator aponeurosis. Dissection with blunt scissors performed over medial and lateral fat pads. With gentle pressure on globe, any bulging pad excised with cautery. Hemostasis ensured. 5-0 vicryl suture placed over anterior surface of tarsal  plate lateral to pupillary line and sutured to levator aponeurosis. Care taken to ensure sutures through tarsal plate not full thickness and no suture material exposed along conjunctiva. Additional suture placed medial and lateral to this central stitch in similar manner. The corneal shield removed and resting position of lid examined. The shield was replaced.  I then performed similar skin excision and exposure tarsal plate and levator aponeurosis on left. Central suture at pupillary line from tarsal plate to aponeurosis completed. Dissection completed over medial and lateral fat pads and bulging fat excised with cautery. Levator plication sutures performed medial and lateral to this to ensure appropriate contour of upper lid. Corneal shields removed and patient assessed for symmetric position of upper lids. Shields replaced and closure completed with running 6-0 prolene on each side.  Corneal shields removed and eyes irrigated with basic salt solution. Tobradex ophthalmic placed in each eye. The patient was allowed to wake from anesthesia, extubated and taken to the recovery room in satisfactory condition.   SPECIMENS: none  DRAINS: none  Irene Limbo, MD [facility] [phone], pin [phone]

## 2016-09-17 NOTE — Anesthesia Procedure Notes (Signed)
Procedure Name: LMA Insertion Date/Time: 09/17/2016 10:32 AM Performed by: Rayvon Char Pre-anesthesia Checklist: Patient identified, Emergency Drugs available, Patient being monitored and Timeout performed Patient Re-evaluated:Patient Re-evaluated prior to inductionOxygen Delivery Method: Circle system utilized Preoxygenation: Pre-oxygenation with 100% oxygen Intubation Type: IV induction Ventilation: Mask ventilation without difficulty LMA: LMA inserted LMA Size: 3.0 Number of attempts: 1 Dental Injury: Teeth and Oropharynx as per pre-operative assessment

## 2016-09-17 NOTE — Anesthesia Postprocedure Evaluation (Signed)
Anesthesia Post Note  Patient: Amy Glass  Procedure(s) Performed: Procedure(s) (LRB): BILATERAL PTOSIS REPAIR EYELID WITH SUTURE TECHNIQUE, BILATERAL UPPER LID BLEPHAROPLASTY WITH EXCESS SKIN WEIGHING EYELID DOWN. (Bilateral) BLEPHAROPLASTY (Bilateral)  Patient location during evaluation: PACU Anesthesia Type: General Level of consciousness: awake Pain management: pain level controlled Vital Signs Assessment: post-procedure vital signs reviewed and stable Respiratory status: spontaneous breathing Cardiovascular status: stable Anesthetic complications: no       Last Vitals:  Vitals:   09/17/16 0943 09/17/16 1156  BP:  (!) 142/98  Pulse: 68 90  Resp: 16 13  Temp: 36.9 C 36.4 C    Last Pain:  Vitals:   09/17/16 1156  TempSrc:   PainSc: 0-No pain                 Amous Crewe

## 2016-09-17 NOTE — Anesthesia Preprocedure Evaluation (Addendum)
Anesthesia Evaluation  Patient identified by MRN, date of birth, ID band Patient awake    Reviewed: Allergy & Precautions, NPO status , Patient's Chart, lab work & pertinent test results  Airway Mallampati: II  TM Distance: >3 FB     Dental   Pulmonary neg pulmonary ROS,    breath sounds clear to auscultation       Cardiovascular hypertension,  Rhythm:Regular Rate:Normal     Neuro/Psych  Neuromuscular disease    GI/Hepatic hiatal hernia,   Endo/Other  diabetes  Renal/GU      Musculoskeletal negative musculoskeletal ROS (+)   Abdominal   Peds  Hematology   Anesthesia Other Findings   Reproductive/Obstetrics                             Anesthesia Physical Anesthesia Plan  ASA: III  Anesthesia Plan: General   Post-op Pain Management:    Induction: Intravenous  Airway Management Planned: LMA  Additional Equipment:   Intra-op Plan:   Post-operative Plan:   Informed Consent: I have reviewed the patients History and Physical, chart, labs and discussed the procedure including the risks, benefits and alternatives for the proposed anesthesia with the patient or authorized representative who has indicated his/her understanding and acceptance.   Dental advisory given  Plan Discussed with: CRNA, Anesthesiologist and Surgeon  Anesthesia Plan Comments:         Anesthesia Quick Evaluation

## 2016-09-17 NOTE — Interval H&P Note (Signed)
History and Physical Interval Note:  09/17/2016 9:58 AM  Amy Glass  has presented today for surgery, with the diagnosis of BILATERAL EYELID PTOSIS, DERMATOCHALASIA  The various methods of treatment have been discussed with the patient and family. After consideration of risks, benefits and other options for treatment, the patient has consented to  Procedure(s): BILATERAL PTOSIS REPAIR EYELID WITH SUTURE TECHNIQUE, BILATERAL UPPER LID BLEPHAROPLASTY WITH EXCESS SKIN WEIGHING EYELID DOWN. (Bilateral) BLEPHAROPLASTY (Bilateral) as a surgical intervention .  The patient's history has been reviewed, patient examined, no change in status, stable for surgery.  I have reviewed the patient's chart and labs.  Questions were answered to the patient's satisfaction.     Salene Mohamud

## 2016-09-18 ENCOUNTER — Encounter (HOSPITAL_BASED_OUTPATIENT_CLINIC_OR_DEPARTMENT_OTHER): Payer: Self-pay | Admitting: Plastic Surgery

## 2016-10-08 ENCOUNTER — Other Ambulatory Visit: Payer: Self-pay | Admitting: Family Medicine

## 2016-10-10 ENCOUNTER — Telehealth: Payer: Self-pay

## 2016-10-10 NOTE — Telephone Encounter (Signed)
Called [facility] to check on pts appt for dysphagia. Pt was scheduled at [facility] for 09/16/16 and did not show for the appt. If pt calls back and needs to be seen again at [facility] a new referral will have to be sent.

## 2016-10-24 DIAGNOSIS — G5601 Carpal tunnel syndrome, right upper limb: Secondary | ICD-10-CM | POA: Diagnosis not present

## 2016-10-24 DIAGNOSIS — G5602 Carpal tunnel syndrome, left upper limb: Secondary | ICD-10-CM | POA: Diagnosis not present

## 2016-10-25 ENCOUNTER — Other Ambulatory Visit: Payer: Self-pay | Admitting: Pharmacist

## 2016-10-25 DIAGNOSIS — F32A Depression, unspecified: Secondary | ICD-10-CM

## 2016-10-25 DIAGNOSIS — F329 Major depressive disorder, single episode, unspecified: Secondary | ICD-10-CM

## 2016-10-25 MED ORDER — VENLAFAXINE HCL ER 75 MG PO CP24: ORAL_CAPSULE | ORAL | 0 refills | Status: DC

## 2016-10-25 MED ORDER — OMEPRAZOLE 20 MG PO CPDR: 20.0000 mg | DELAYED_RELEASE_CAPSULE | Freq: Two times a day (BID) | ORAL | 0 refills | Status: DC

## 2016-10-30 ENCOUNTER — Telehealth: Payer: Self-pay | Admitting: Family Medicine

## 2016-10-30 DIAGNOSIS — E119 Type 2 diabetes mellitus without complications: Secondary | ICD-10-CM

## 2016-10-30 NOTE — Telephone Encounter (Signed)
Will route to PCP 

## 2016-10-30 NOTE — Telephone Encounter (Signed)
Patient states JANUMET 50-500 MG tablet [[id number]] Rx is too expensive and would like to know if there is an alternative option

## 2016-10-31 MED ORDER — SITAGLIPTIN PHOSPHATE 100 MG PO TABS: 100.0000 mg | ORAL_TABLET | Freq: Every day | ORAL | 5 refills | Status: DC

## 2016-10-31 MED ORDER — METFORMIN HCL ER 500 MG PO TB24: 1000.0000 mg | ORAL_TABLET | Freq: Every day | ORAL | 5 refills | Status: DC

## 2016-10-31 NOTE — Telephone Encounter (Signed)
Patient advised to take [place] and metformin separately Ordered

## 2016-11-12 ENCOUNTER — Other Ambulatory Visit: Payer: Self-pay | Admitting: Family Medicine

## 2016-11-25 DIAGNOSIS — F319 Bipolar disorder, unspecified: Secondary | ICD-10-CM | POA: Diagnosis not present

## 2016-11-26 DIAGNOSIS — M47812 Spondylosis without myelopathy or radiculopathy, cervical region: Secondary | ICD-10-CM | POA: Diagnosis not present

## 2016-11-26 DIAGNOSIS — M25511 Pain in right shoulder: Secondary | ICD-10-CM | POA: Diagnosis not present

## 2016-11-26 DIAGNOSIS — Z79891 Long term (current) use of opiate analgesic: Secondary | ICD-10-CM | POA: Diagnosis not present

## 2016-11-26 DIAGNOSIS — M25572 Pain in left ankle and joints of left foot: Secondary | ICD-10-CM | POA: Diagnosis not present

## 2016-11-26 DIAGNOSIS — G894 Chronic pain syndrome: Secondary | ICD-10-CM | POA: Diagnosis not present

## 2016-11-26 DIAGNOSIS — M47816 Spondylosis without myelopathy or radiculopathy, lumbar region: Secondary | ICD-10-CM | POA: Diagnosis not present

## 2016-12-04 ENCOUNTER — Encounter: Payer: Self-pay | Admitting: Family Medicine

## 2016-12-17 ENCOUNTER — Other Ambulatory Visit: Payer: Self-pay | Admitting: Family Medicine

## 2016-12-17 DIAGNOSIS — F329 Major depressive disorder, single episode, unspecified: Secondary | ICD-10-CM

## 2016-12-17 DIAGNOSIS — F32A Depression, unspecified: Secondary | ICD-10-CM

## 2016-12-26 ENCOUNTER — Ambulatory Visit: Payer: Medicare Other | Attending: Family Medicine | Admitting: Family Medicine

## 2016-12-26 ENCOUNTER — Encounter: Payer: Self-pay | Admitting: Family Medicine

## 2016-12-26 VITALS — BP 150/90 | HR 78 | Temp 98.1°F | Wt 141.6 lb

## 2016-12-26 DIAGNOSIS — E781 Pure hyperglyceridemia: Secondary | ICD-10-CM

## 2016-12-26 DIAGNOSIS — M79672 Pain in left foot: Secondary | ICD-10-CM | POA: Diagnosis not present

## 2016-12-26 DIAGNOSIS — I10 Essential (primary) hypertension: Secondary | ICD-10-CM

## 2016-12-26 DIAGNOSIS — F419 Anxiety disorder, unspecified: Secondary | ICD-10-CM | POA: Diagnosis not present

## 2016-12-26 DIAGNOSIS — M47816 Spondylosis without myelopathy or radiculopathy, lumbar region: Secondary | ICD-10-CM | POA: Diagnosis not present

## 2016-12-26 DIAGNOSIS — F319 Bipolar disorder, unspecified: Secondary | ICD-10-CM | POA: Diagnosis not present

## 2016-12-26 DIAGNOSIS — F329 Major depressive disorder, single episode, unspecified: Secondary | ICD-10-CM | POA: Diagnosis not present

## 2016-12-26 DIAGNOSIS — H1011 Acute atopic conjunctivitis, right eye: Secondary | ICD-10-CM | POA: Diagnosis not present

## 2016-12-26 DIAGNOSIS — E119 Type 2 diabetes mellitus without complications: Secondary | ICD-10-CM | POA: Diagnosis not present

## 2016-12-26 DIAGNOSIS — E1159 Type 2 diabetes mellitus with other circulatory complications: Secondary | ICD-10-CM | POA: Insufficient documentation

## 2016-12-26 DIAGNOSIS — Z780 Asymptomatic menopausal state: Secondary | ICD-10-CM | POA: Insufficient documentation

## 2016-12-26 DIAGNOSIS — M47812 Spondylosis without myelopathy or radiculopathy, cervical region: Secondary | ICD-10-CM | POA: Diagnosis not present

## 2016-12-26 DIAGNOSIS — Z23 Encounter for immunization: Secondary | ICD-10-CM

## 2016-12-26 DIAGNOSIS — M79671 Pain in right foot: Secondary | ICD-10-CM | POA: Diagnosis not present

## 2016-12-26 DIAGNOSIS — Z7984 Long term (current) use of oral hypoglycemic drugs: Secondary | ICD-10-CM | POA: Diagnosis not present

## 2016-12-26 DIAGNOSIS — F32A Depression, unspecified: Secondary | ICD-10-CM

## 2016-12-26 DIAGNOSIS — G894 Chronic pain syndrome: Secondary | ICD-10-CM | POA: Diagnosis not present

## 2016-12-26 DIAGNOSIS — N951 Menopausal and female climacteric states: Secondary | ICD-10-CM

## 2016-12-26 DIAGNOSIS — H578 Other specified disorders of eye and adnexa: Secondary | ICD-10-CM | POA: Diagnosis present

## 2016-12-26 LAB — POCT GLYCOSYLATED HEMOGLOBIN (HGB A1C): Hemoglobin A1C: 6.2

## 2016-12-26 LAB — GLUCOSE, POCT (MANUAL RESULT ENTRY): POC GLUCOSE: 94 mg/dL (ref 70–99)

## 2016-12-26 MED ORDER — VENLAFAXINE HCL ER 75 MG PO CP24: ORAL_CAPSULE | ORAL | 2 refills | Status: DC

## 2016-12-26 MED ORDER — KETOTIFEN FUMARATE 0.025 % OP SOLN: 1.0000 [drp] | Freq: Two times a day (BID) | OPHTHALMIC | 1 refills | Status: DC

## 2016-12-26 MED ORDER — HYDROXYZINE HCL 25 MG PO TABS: 12.5000 mg | ORAL_TABLET | Freq: Three times a day (TID) | ORAL | 5 refills | Status: DC | PRN

## 2016-12-26 MED ORDER — VENLAFAXINE HCL ER 150 MG PO CP24: ORAL_CAPSULE | ORAL | 11 refills | Status: DC

## 2016-12-26 MED ORDER — TRUEPLUS LANCETS 28G MISC: 1.0000 | Freq: Three times a day (TID) | 11 refills | Status: DC

## 2016-12-26 MED ORDER — LISINOPRIL 20 MG PO TABS: 20.0000 mg | ORAL_TABLET | Freq: Every day | ORAL | 5 refills | Status: DC

## 2016-12-26 MED ORDER — TRAZODONE HCL 50 MG PO TABS: 50.0000 mg | ORAL_TABLET | Freq: Every day | ORAL | 5 refills | Status: DC

## 2016-12-26 MED ORDER — TRUE METRIX METER W/DEVICE KIT: 1.0000 | PACK | 0 refills | Status: DC | PRN

## 2016-12-26 MED ORDER — LISINOPRIL 20 MG PO TABS: 20.0000 mg | ORAL_TABLET | Freq: Two times a day (BID) | ORAL | 11 refills | Status: DC

## 2016-12-26 MED ORDER — GLUCOSE BLOOD VI STRP: 1.0000 | ORAL_STRIP | Freq: Every day | 11 refills | Status: DC

## 2016-12-26 MED FILL — TRUE METRIX TEST STRIP: 30 days supply | Qty: 50 | Fill #0

## 2016-12-26 MED FILL — TRUEplus LANCETS 28G MISC: 30 days supply | Qty: 100 | Fill #0

## 2016-12-26 MED FILL — ?HYDROXYZINE HCL 25 MG TAB: 25 MG | 10 days supply | Qty: 30 | Fill #0

## 2016-12-26 MED FILL — VENLAFAXINE HCL ER 150 MG C: 150 | 30 days supply | Qty: 30 | Fill #0

## 2016-12-26 MED FILL — ?TRAZODONE 50 MG TABLET: 50 | 30 days supply | Qty: 30 | Fill #0

## 2016-12-26 MED FILL — LISINOPRIL 20 MG TABLET: 20 | 30 days supply | Qty: 60 | Fill #0

## 2016-12-26 MED FILL — !TRUE METRIX BLOOD GLUCOSE: 30 days supply | Qty: 1 | Fill #0

## 2016-12-26 NOTE — Progress Notes (Signed)
SUBJECTIVE:  56 y.o. female for routine check up. She has diabetes and is due for diabetes follow up. She also complains of eye problem.   1. R eye itching: x 2 weeks. Also associated with redness.   2. L eye wandering: this has been presents since she was a little girl but is worsening. She also feels trouble focusing. She feel her L eye wanders to the left. No vision loss.   3. Diabetes: she is not taking janumet for past 4 weeks. She is not taking Lipitor for several months.   4. HTN: she has been out of lisinopril for the past 4 weeks.   5. Anxiety: improved with Effexor. Reports recent stressor of infidelity by  her husband. She reports being irritable. She request increase dose of Effexor.   6. Foot pain: x 10 years. both feet. Worse on the R side. Burning pain. Her feet feel swollen.   Social History  Substance Use Topics  . Smoking status: Never Smoker  . Smokeless tobacco: Never Used  . Alcohol use No   Current Outpatient Prescriptions  Medication Sig Dispense Refill  . atorvastatin (LIPITOR) 40 MG tablet TAKE 1 TABLET BY MOUTH EVERY DAY 30 tablet 3  . Blood Glucose Monitoring Suppl (ACCU-CHEK AVIVA PLUS) w/Device KIT Use to test blood sugar 3 times daily before meals. E11.9 1 kit 0  . fluticasone (FLONASE) 50 MCG/ACT nasal spray INSTILL 2 SPRAYS INTO EACH NOSTRIL ONCE DAILY 16 g 1  . glucose blood (ACCU-CHEK AVIVA PLUS) test strip Use as instructed TID before meal Code E.11.9 100 each 12  . hydrOXYzine (ATARAX/VISTARIL) 25 MG tablet Take 25 mg by mouth 3 (three) times daily as needed.    Marland Kitchen lisinopril (PRINIVIL,ZESTRIL) 10 MG tablet TAKE 1 TABLET (10 MG TOTAL) BY MOUTH DAILY. 30 tablet 2  . metFORMIN (GLUCOPHAGE XR) 500 MG 24 hr tablet Take 2 tablets (1,000 mg total) by mouth daily after supper. 60 tablet 5  . omeprazole (PRILOSEC) 20 MG capsule Take 1 capsule (20 mg total) by mouth 2 (two) times daily. 180 capsule 0  . omeprazole (PRILOSEC) 20 MG capsule TAKE 1 CAPSULE (20  MG TOTAL) BY MOUTH 2 (TWO) TIMES DAILY. 60 capsule 0  . ondansetron (ZOFRAN) 4 MG tablet Take 1 tablet (4 mg total) by mouth every 8 (eight) hours as needed for nausea or vomiting. 30 tablet 0  . sitaGLIPtin (JANUVIA) 100 MG tablet Take 1 tablet (100 mg total) by mouth daily. 30 tablet 5  . traZODone (DESYREL) 50 MG tablet Take 50 mg by mouth at bedtime.    Marland Kitchen venlafaxine XR (EFFEXOR-XR) 75 MG 24 hr capsule TAKE 1 CAPSULE BY MOUTH DAILY WITH BREAKFAST 90 capsule 0  . venlafaxine XR (EFFEXOR-XR) 75 MG 24 hr capsule TAKE 1 CAPSULE BY MOUTH DAILY WITH BREAKFAST 30 capsule 2   No current facility-administered medications for this visit.    Allergies: Nitroglycerin  No LMP recorded. Patient is not currently having periods (Reason: Perimenopausal).  ROS:  Feeling well. No dyspnea or chest pain on exertion.  No abdominal pain, change in bowel habits, black or bloody stools.  No urinary tract symptoms. GYN ROS: no breast pain or new or enlarging lumps on self exam, no vaginal bleeding. No neurological complaints.  OBJECTIVE:  The patient appears well, alert, oriented x 3, in no distress. BP (!) 150/90   Pulse 78   Temp 98.1 F (36.7 C) (Oral)   Wt 141 lb 9.6 oz (64.2 kg)  SpO2 100%   BMI 28.60 kg/m   BP Readings from Last 3 Encounters:  12/26/16 (!) 150/90  09/17/16 (!) 150/99  07/09/16 130/70    ENT normal.  Neck supple. No adenopathy or thyromegaly. PERLA. Lungs are clear, good air entry, no wheezes, rhonchi or rales. S1 and S2 normal, no murmurs, regular rate and rhythm. Abdomen soft without tenderness, guarding, mass or organomegaly. Extremities show no edema, normal peripheral pulses. Neurological is normal, no focal findings.  BREAST EXAM: breasts appear normal, no suspicious masses, no skin or nipple changes or axillary nodes  PELVIC EXAM: examination not indicated   ASSESSMENT:  well woman  PLAN:  mammogram return annually or prn  Diagnoses and all orders for this  visit:  Controlled type 2 diabetes mellitus without complication, without long-term current use of insulin (HCC) -     POCT glycosylated hemoglobin (Hb A1C) -     Glucose (CBG) -     Blood Glucose Monitoring Suppl (TRUE METRIX METER) w/Device KIT; 1 each by Does not apply route as needed. -     glucose blood (TRUE METRIX BLOOD GLUCOSE TEST) test strip; 1 each by Other route daily. -     TRUEPLUS LANCETS 28G MISC; 1 each by Does not apply route 3 (three) times daily.  Hypertension, unspecified type -     Discontinue: lisinopril (PRINIVIL,ZESTRIL) 20 MG tablet; Take 1 tablet (20 mg total) by mouth daily. -     lisinopril (PRINIVIL,ZESTRIL) 20 MG tablet; Take 1 tablet (20 mg total) by mouth 2 (two) times daily.  Depression, unspecified depression type -     Discontinue: venlafaxine XR (EFFEXOR-XR) 75 MG 24 hr capsule; TAKE 1 CAPSULE BY MOUTH DAILY WITH BREAKFAST -     traZODone (DESYREL) 50 MG tablet; Take 1 tablet (50 mg total) by mouth at bedtime. -     hydrOXYzine (ATARAX/VISTARIL) 25 MG tablet; Take 0.5-1 tablets (12.5-25 mg total) by mouth 3 (three) times daily as needed. -     Ambulatory referral to Ophthalmology -     venlafaxine XR (EFFEXOR-XR) 150 MG 24 hr capsule; TAKE 1 CAPSULE BY MOUTH DAILY WITH BREAKFAST  Foot pain, bilateral -     Ambulatory referral to Podiatry  High triglycerides -     Lipid Panel  Acute allergic conjunctivitis, right -     ketotifen (ZADITOR) 0.025 % ophthalmic solution; Place 1 drop into both eyes 2 (two) times daily.  Perimenopause -     FSH/LH -     Estradiol  Other orders -     Pneumococcal polysaccharide vaccine 23-valent greater than or equal to 2yo subcutaneous/IM -     Tdap vaccine greater than or equal to 7yo IM

## 2016-12-26 NOTE — Patient Instructions (Addendum)
Diagnoses and all orders for this visit:  Controlled type 2 diabetes mellitus without complication, without long-term current use of insulin (HCC) -     POCT glycosylated hemoglobin (Hb A1C) -     Glucose (CBG) -     Blood Glucose Monitoring Suppl (TRUE METRIX METER) w/Device KIT; 1 each by Does not apply route as needed. -     glucose blood (TRUE METRIX BLOOD GLUCOSE TEST) test strip; 1 each by Other route daily. -     TRUEPLUS LANCETS 28G MISC; 1 each by Does not apply route 3 (three) times daily.  Hypertension, unspecified type -     Discontinue: lisinopril (PRINIVIL,ZESTRIL) 20 MG tablet; Take 1 tablet (20 mg total) by mouth daily. -     lisinopril (PRINIVIL,ZESTRIL) 20 MG tablet; Take 1 tablet (20 mg total) by mouth 2 (two) times daily.  Depression, unspecified depression type -     Discontinue: venlafaxine XR (EFFEXOR-XR) 75 MG 24 hr capsule; TAKE 1 CAPSULE BY MOUTH DAILY WITH BREAKFAST -     traZODone (DESYREL) 50 MG tablet; Take 1 tablet (50 mg total) by mouth at bedtime. -     hydrOXYzine (ATARAX/VISTARIL) 25 MG tablet; Take 0.5-1 tablets (12.5-25 mg total) by mouth 3 (three) times daily as needed. -     Ambulatory referral to Ophthalmology -     venlafaxine XR (EFFEXOR-XR) 150 MG 24 hr capsule; TAKE 1 CAPSULE BY MOUTH DAILY WITH BREAKFAST  Foot pain, bilateral -     Ambulatory referral to Podiatry  High triglycerides -     Lipid Panel  Acute allergic conjunctivitis, right -     ketotifen (ZADITOR) 0.025 % ophthalmic solution; Place 1 drop into both eyes 2 (two) times daily.  Perimenopause -     FSH/LH -     Estradiol   F/u in 4 weeks for BP check with nurse  F/u in 3 months for HTN and diabetes   Dr. Adrian Blackwater

## 2016-12-27 ENCOUNTER — Other Ambulatory Visit: Payer: Self-pay | Admitting: Family Medicine

## 2016-12-27 LAB — FSH/LH
FSH: 34.1 m[IU]/mL
LH: 24 m[IU]/mL

## 2016-12-27 LAB — LIPID PANEL
CHOL/HDL RATIO: 10.6 ratio — AB (ref 0.0–4.4)
Cholesterol, Total: 276 mg/dL — ABNORMAL HIGH (ref 100–199)
HDL: 26 mg/dL — ABNORMAL LOW (ref >39)
Triglycerides: 1030 mg/dL (ref 0–149)

## 2016-12-27 LAB — ESTRADIOL: Estradiol: <5 pg/mL

## 2016-12-27 MED ORDER — ATORVASTATIN CALCIUM 40 MG PO TABS: 40.0000 mg | ORAL_TABLET | Freq: Every day | ORAL | 3 refills | Status: DC

## 2016-12-30 MED FILL — ATORVASTATIN 40 MG TABLET: 40 | 30 days supply | Qty: 30 | Fill #0

## 2016-12-30 NOTE — Assessment & Plan Note (Signed)
Depression and anxiety Increase Effexor to 150 mg daily

## 2016-12-30 NOTE — Assessment & Plan Note (Signed)
Elevated BP Increase lisinopril to 20 mg BID

## 2017-01-06 ENCOUNTER — Telehealth: Payer: Self-pay

## 2017-01-06 NOTE — Telephone Encounter (Signed)
Pt was called and informed to return phone call for lab results. If pt returns call:Hormone levels are in keeping with postmenopausal state. Cholesterol is severely elevated and I have sent a prescription for atorvastatin to the pharmacy. Atorvastatin(onsite pharmacy)

## 2017-01-10 DIAGNOSIS — F4310 Post-traumatic stress disorder, unspecified: Secondary | ICD-10-CM | POA: Diagnosis not present

## 2017-01-10 DIAGNOSIS — F319 Bipolar disorder, unspecified: Secondary | ICD-10-CM | POA: Diagnosis not present

## 2017-01-11 ENCOUNTER — Other Ambulatory Visit: Payer: Self-pay | Admitting: Family Medicine

## 2017-01-16 ENCOUNTER — Ambulatory Visit: Payer: Medicare Other | Admitting: Podiatry

## 2017-01-28 MED FILL — VENLAFAXINE HCL ER 150 MG C: 150 | 30 days supply | Qty: 30 | Fill #1

## 2017-01-31 ENCOUNTER — Ambulatory Visit (HOSPITAL_COMMUNITY)
Admission: EM | Admit: 2017-01-31 | Discharge: 2017-01-31 | Disposition: A | Discharge status: Home / Self Care | Payer: Medicare Other | Attending: Internal Medicine | Admitting: Internal Medicine

## 2017-01-31 ENCOUNTER — Encounter (HOSPITAL_COMMUNITY): Payer: Self-pay | Admitting: Emergency Medicine

## 2017-01-31 DIAGNOSIS — K529 Noninfective gastroenteritis and colitis, unspecified: Secondary | ICD-10-CM

## 2017-01-31 LAB — GLUCOSE, CAPILLARY: Glucose-Capillary: 153 mg/dL — ABNORMAL HIGH (ref 65–99)

## 2017-01-31 MED ORDER — ONDANSETRON HCL 4 MG PO TABS: 4.0000 mg | ORAL_TABLET | Freq: Three times a day (TID) | ORAL | 0 refills | Status: AC | PRN

## 2017-01-31 NOTE — ED Notes (Signed)
Rx  Did  Not  Go  Through  On  Computer        rx phoned  To  [facility]  [address]   zofran

## 2017-01-31 NOTE — ED Triage Notes (Signed)
Pt here for HTN onset 3 weeks associated w/nausea and vomiting, HA, diaphoresis   Denies CP  Brought back on wheelchair.... A&O x4... NAD... Ambulatory

## 2017-01-31 NOTE — Discharge Instructions (Signed)
Bland diet (rice, applesauce, bananas, toast, crackers, etc.)  Avoid dairy products and greasy foods  Stay hydrated (Gatorade, water)  Avoid sodas and caffeine  Zofran as needed for nausea/vomiting

## 2017-01-31 NOTE — ED Provider Notes (Signed)
CSN: [id number]     Arrival date & time 01/31/17  1709 History   First MD Initiated Contact with Patient 01/31/17 1822     Chief Complaint  Patient presents with  . Hypertension   (Consider location/radiation/quality/duration/timing/severity/associated sxs/prior Treatment) Subjective:   Amy Glass is a 56 y.o. female who presents for evaluation of nausea and vomiting. Onset of symptoms was a week ago. Patient describes nausea as moderate. Vomiting has occurred several times over the past 1 week. Vomitus is described as undigested food, dry heaves. Symptoms have been associated with mild abdominal pain and ability to keep down some fluids.  Patient denies fever, hematemesis, melena, constipation, diarrhea, fevers, sweats or chills. Symptoms have waxed and waned.  Evaluation to date has been none. Treatment to date has been none. Notably, the patient reports that she has been under a lot of stress recently (divorce, increase in work/hours) and thinks that her symptoms are because of this. She also feels tired. She was at a convention earlier today and her blood pressure check in which she states was "sky high." Her BP in the clinic was WNL.   The following portions of the patient's history were reviewed and updated as appropriate: allergies, current medications, past family history, past medical history, past social history, past surgical history and problem list.          Past Medical History:  Diagnosis Date  . Allergy   . Anxiety    as child   . Depression    as child  . Diabetes mellitus without complication ([place]) Dx 4403  . Diverticulosis   . Hiatal hernia   . Hyperplastic colon polyp   . Hypertension Dx 2003  . Schatzki's ring    Past Surgical History:  Procedure Laterality Date  . BACK SURGERY  2002   lumbar  . BROW LIFT Bilateral 09/17/2016   Procedure: BLEPHAROPLASTY;  Surgeon: Irene Limbo, MD;  Location: [place];  Service: Plastics;   Laterality: Bilateral;  . CARPAL TUNNEL RELEASE Right   . CARPAL TUNNEL RELEASE Left 06/2016  . CESAREAN SECTION  05/20/2003   . ESOPHAGEAL MANOMETRY N/A 10/25/2015   Procedure: ESOPHAGEAL MANOMETRY (EM);  Surgeon: Jerene Bears, MD;  Location: [facility];  Service: Gastroenterology;  Laterality: N/A;  . PTOSIS REPAIR Bilateral 09/17/2016   Procedure: BILATERAL PTOSIS REPAIR EYELID WITH SUTURE TECHNIQUE, BILATERAL UPPER LID BLEPHAROPLASTY WITH EXCESS SKIN WEIGHING EYELID DOWN.;  Surgeon: Irene Limbo, MD;  Location: [place];  Service: Plastics;  Laterality: Bilateral;  . SHOULDER SURGERY  08/2013    b/l shoulder   . SHOULDER SURGERY Right 11/2015   Family History  Problem Relation Age of Onset  . Diabetes Mother   . Heart disease Mother   . Hyperlipidemia Mother   . Hypertension Mother   . Renal cancer Maternal Aunt   . Stomach cancer Maternal Uncle   . Colon cancer Neg Hx   . Esophageal cancer Neg Hx   . Rectal cancer Neg Hx    Social History  Substance Use Topics  . Smoking status: Never Smoker  . Smokeless tobacco: Never Used  . Alcohol use No   OB History    No data available     Review of Systems  Constitutional: Positive for fatigue.  Gastrointestinal: Positive for abdominal pain, nausea and vomiting. Negative for blood in stool, constipation and diarrhea.  Genitourinary: Negative for dysuria.  Psychiatric/Behavioral: Negative for confusion, hallucinations and suicidal ideas. The patient is  nervous/anxious.   All other systems reviewed and are negative.   Allergies  Nitroglycerin  Home Medications   Prior to Admission medications   Medication Sig Start Date End Date Taking? Authorizing Provider  atorvastatin (LIPITOR) 40 MG tablet TAKE 1 TABLET BY MOUTH EVERY DAY 01/13/17  Yes Funches, Josalyn, MD  lisinopril (PRINIVIL,ZESTRIL) 20 MG tablet Take 1 tablet (20 mg total) by mouth 2 (two) times daily. 12/26/16  Yes Funches, Josalyn, MD  traZODone  (DESYREL) 50 MG tablet Take 1 tablet (50 mg total) by mouth at bedtime. 12/26/16  Yes Funches, Josalyn, MD  TRUEPLUS LANCETS 28G MISC 1 each by Does not apply route 3 (three) times daily. 12/26/16  Yes Funches, Josalyn, MD  venlafaxine XR (EFFEXOR-XR) 150 MG 24 hr capsule TAKE 1 CAPSULE BY MOUTH DAILY WITH BREAKFAST 12/26/16  Yes Funches, Josalyn, MD  Blood Glucose Monitoring Suppl (TRUE METRIX METER) w/Device KIT 1 each by Does not apply route as needed. 12/26/16   Funches, Adriana Mccallum, MD  glucose blood (TRUE METRIX BLOOD GLUCOSE TEST) test strip 1 each by Other route daily. 12/26/16   Funches, Adriana Mccallum, MD  hydrOXYzine (ATARAX/VISTARIL) 25 MG tablet Take 0.5-1 tablets (12.5-25 mg total) by mouth 3 (three) times daily as needed. 12/26/16   Funches, Adriana Mccallum, MD  ketotifen (ZADITOR) 0.025 % ophthalmic solution Place 1 drop into both eyes 2 (two) times daily. 12/26/16   Funches, Adriana Mccallum, MD  ondansetron (ZOFRAN) 4 MG tablet Take 1 tablet (4 mg total) by mouth every 8 (eight) hours as needed for nausea or vomiting. 01/31/17 02/05/17  Enrique Sack, FNP   Meds Ordered and Administered this Visit  Medications - No data to display  BP (!) 122/91 (BP Location: Left Arm)   Pulse (!) 113   Temp 98.4 F (36.9 C) (Oral)   Resp 20   SpO2 99%  No data found.   Physical Exam  Constitutional: She is oriented to person, place, and time. She appears well-developed and well-nourished. No distress.  Neck: Normal range of motion. Neck supple.  Cardiovascular: Normal rate and regular rhythm.   Pulmonary/Chest: Effort normal and breath sounds normal.  Abdominal: Soft. Bowel sounds are normal. She exhibits no distension. There is no tenderness. There is no rebound and no guarding.  Musculoskeletal: Normal range of motion.  Neurological: She is alert and oriented to person, place, and time.  Skin: Skin is warm and dry.  Psychiatric: She has a normal mood and affect.  Slightly anxious     Urgent Care Course      Procedures (including critical care time)  Labs Review Labs Reviewed - No data to display  Imaging Review No results found.   Visual Acuity Review  Right Eye Distance:   Left Eye Distance:   Bilateral Distance:    Right Eye Near:   Left Eye Near:    Bilateral Near:         MDM   1. Noninfectious gastroenteritis, unspecified type    Antiemetics per medication orders. Dietary guidelines discussed. Discussed the diagnosis with the patient. All questions answered. Follow up with PCP if not improving.  Discussed diagnosis and treatment with patient. All questions have been answered and all concerns have been addressed. The patient verbalized understanding and had no further questions    Enrique Sack, [place] 01/31/17 1831

## 2017-02-13 ENCOUNTER — Ambulatory Visit: Payer: Medicare Other | Attending: Family Medicine | Admitting: Physician Assistant

## 2017-02-13 VITALS — BP 122/88 | HR 79 | Temp 98.8°F | Resp 18 | Ht 59.0 in | Wt 144.2 lb

## 2017-02-13 DIAGNOSIS — G8929 Other chronic pain: Secondary | ICD-10-CM | POA: Diagnosis not present

## 2017-02-13 DIAGNOSIS — M25511 Pain in right shoulder: Secondary | ICD-10-CM

## 2017-02-13 DIAGNOSIS — E118 Type 2 diabetes mellitus with unspecified complications: Secondary | ICD-10-CM | POA: Diagnosis not present

## 2017-02-13 DIAGNOSIS — M542 Cervicalgia: Secondary | ICD-10-CM | POA: Diagnosis not present

## 2017-02-13 DIAGNOSIS — I10 Essential (primary) hypertension: Secondary | ICD-10-CM | POA: Diagnosis not present

## 2017-02-13 DIAGNOSIS — Z79899 Other long term (current) drug therapy: Secondary | ICD-10-CM | POA: Diagnosis not present

## 2017-02-13 DIAGNOSIS — M25512 Pain in left shoulder: Secondary | ICD-10-CM | POA: Insufficient documentation

## 2017-02-13 LAB — GLUCOSE, POCT (MANUAL RESULT ENTRY): POC Glucose: 174 mg/dl — AB (ref 70–99)

## 2017-02-13 MED ORDER — HYDROCODONE-ACETAMINOPHEN 10-325 MG PO TABS: 1.0000 | ORAL_TABLET | Freq: Three times a day (TID) | ORAL | 0 refills | Status: DC | PRN

## 2017-02-13 NOTE — Progress Notes (Signed)
Patient is concerned because her pain management clinic is not answering  Patient has eaten  Patient has had any medication

## 2017-02-13 NOTE — Progress Notes (Signed)
Amy Glass, is a 56 y.o. female  JME:[id number]  IWL:[id number]  DOB - 11-27-1960  Subjective:  Chief Complaint and HPI: Amy Glass is a 56 y.o. female here today for new referral for pain management.  She has been being managed in [place], [place] for a while.  Currently, she is unable to get them on the phone(we tried multiple times in the office) and are unable to reach them/leave a msg.  Website was also checked and is down.  DEA registry reviewed with Amy Glass and there is no suspicious activity.  Pain is primarily in neck, upper back, B shoulders and is unchanged.  She has trouble with work when she is in pain and out of pain meds.  Although her blood sugar is high today, it has been in the 90s fasting at home.  She was previously on medications but currently supposed to be working on diet/exercise.  BP has been stable.  Social:  Non smoker FH: dad is deceased, mom with htn, DM, hyperlipidemia  ROS:   Constitutional:  No f/c, No night sweats, No unexplained weight loss. EENT:  No vision changes, No blurry vision, No hearing changes. No mouth, throat, or ear problems.  Respiratory: No cough, No SOB Cardiac: No CP, no palpitations GI:  No abd pain, No N/V/D. GU: No Urinary s/sx Musculoskeletal:  Pain as above Neuro: No headache, no dizziness, no motor weakness.  Skin: No rash Endocrine:  No polydipsia. No polyuria.  Psych: Denies SI/HI  No problems updated.  ALLERGIES: Allergies  Allergen Reactions  . Nitroglycerin Other (See Comments)    Migraines, (only tried nitro patch. Has never tried the pills)    PAST MEDICAL HISTORY: Past Medical History:  Diagnosis Date  . Allergy   . Anxiety    as child   . Depression    as child  . Diabetes mellitus without complication ([place]) Dx 1740  . Diverticulosis   . Hiatal hernia   . Hyperplastic colon polyp   . Hypertension Dx 2003  . Schatzki's ring     MEDICATIONS AT HOME: Prior to Admission medications     Medication Sig Start Date End Date Taking? Authorizing Provider  Blood Glucose Monitoring Suppl (TRUE METRIX METER) w/Device KIT 1 each by Does not apply route as needed. 12/26/16  Yes Glass, Josalyn, MD  glucose blood (TRUE METRIX BLOOD GLUCOSE TEST) test strip 1 each by Other route daily. 12/26/16  Yes Glass, Josalyn, MD  hydrOXYzine (ATARAX/VISTARIL) 25 MG tablet Take 0.5-1 tablets (12.5-25 mg total) by mouth 3 (three) times daily as needed. 12/26/16  Yes Glass, Josalyn, MD  ketotifen (ZADITOR) 0.025 % ophthalmic solution Place 1 drop into both eyes 2 (two) times daily. 12/26/16  Yes Glass, Josalyn, MD  lisinopril (PRINIVIL,ZESTRIL) 20 MG tablet Take 1 tablet (20 mg total) by mouth 2 (two) times daily. 12/26/16  Yes Glass, Josalyn, MD  traZODone (DESYREL) 50 MG tablet Take 1 tablet (50 mg total) by mouth at bedtime. 12/26/16  Yes Glass, Josalyn, MD  TRUEPLUS LANCETS 28G MISC 1 each by Does not apply route 3 (three) times daily. 12/26/16  Yes Glass, Josalyn, MD  venlafaxine XR (EFFEXOR-XR) 150 MG 24 hr capsule TAKE 1 CAPSULE BY MOUTH DAILY WITH BREAKFAST 12/26/16  Yes Glass, Josalyn, MD  atorvastatin (LIPITOR) 40 MG tablet TAKE 1 TABLET BY MOUTH EVERY DAY Patient not taking: Reported on 02/13/2017 01/13/17   Amy Nearing, MD  HYDROcodone-acetaminophen (NORCO) 10-325 MG tablet Take 1 tablet by mouth every 8 (  eight) hours as needed. 02/13/17   Amy Donovan, PA-C     Objective:  EXAM:   Vitals:   02/13/17 0938  BP: 122/88  Pulse: 79  Resp: 18  Temp: 98.8 F (37.1 C)  TempSrc: Oral  SpO2: 99%  Weight: 144 lb 3.2 oz (65.4 kg)  Height: '4\' 11"'  (1.499 m)    General appearance : A&OX3. NAD. Non-toxic-appearing HEENT: Atraumatic and Normocephalic.  PERRLA. EOM intact. Neck: supple, no JVD. No cervical lymphadenopathy. No thyromegaly Chest/Lungs:  Breathing-non-labored, Good air entry bilaterally, breath sounds normal without rales, rhonchi, or wheezing  CVS: S1 S2 regular, no  murmurs, gallops, rubs  Extremities: Bilateral Lower Ext shows no edema, both legs are warm to touch with = pulse throughout Neurology:  CN II-XII grossly intact, Non focal.   Psych:  TP linear. J/I WNL. Normal speech. Appropriate eye contact and affect.  Skin:  No Rash  Data Review Lab Results  Component Value Date   HGBA1C 6.2 12/26/2016   HGBA1C 6.1 05/24/2016   HGBA1C 6.5 (H) 12/21/2015     Assessment & Plan   1. Hypertension, unspecified type Controlled. Continue current regimen  2. Controlled type 2 diabetes mellitus with complication, without long-term current use of insulin (HCC) Sub-optimal control.  She wants to continue to work on this with diet and exercise and is currently off meds.  We discussed this at length. I have had a lengthy discussion and provided education about insulin resistance and the intake of too much sugar/refined carbohydrates.  I have advised the patient to work at a goal of eliminating sugary drinks, candy, desserts, sweets, refined sugars, processed foods, and white carbohydrates.  The patient expresses understanding.   - POCT glucose (manual entry)  3. Neck pain chronic - Ambulatory referral to Pain Clinic  4. Chronic pain of both shoulders DEA database reviewed with Amy Glass and last Rx was filled 12/26/2016 for #90 tabs and has lasted her until now.   - Ambulatory referral to Pain Clinic  Patient have been counseled extensively about nutrition and exercise  Return in about 2 months (around 04/16/2017) for assign PCP; f/up DM(diet controlled) and htn.  The patient was given clear instructions to go to ER or return to medical center if symptoms don't improve, worsen or new problems develop. The patient verbalized understanding. The patient was told to call to get lab results if they haven't heard anything in the next week.     Amy Caldron, PA-C [facility] [place], [place]   02/13/2017,  10:10 AMPatient ID: Amy Glass, female   DOB: 11-14-1960, 56 y.o.   MRN: [id number]

## 2017-03-03 MED FILL — LISINOPRIL 20 MG TAB: 20 | 30 days supply | Qty: 60 | Fill #1

## 2017-03-03 MED FILL — VENLAFAXINE HCL ER 150 MG C: 150 | 30 days supply | Qty: 30 | Fill #2

## 2017-03-04 ENCOUNTER — Telehealth: Payer: Self-pay | Admitting: Family Medicine

## 2017-03-04 NOTE — Telephone Encounter (Signed)
Patient called the office asking to speak with nurse in regards to getting the number for [facility]. Please return the call at [phone].   Thank you.

## 2017-03-06 DIAGNOSIS — F319 Bipolar disorder, unspecified: Secondary | ICD-10-CM | POA: Diagnosis not present

## 2017-03-06 MED FILL — traZODone HCL 100 MG TABS: 100 | 30 days supply | Qty: 60 | Fill #0

## 2017-03-06 MED FILL — hydrOXYzine HCL 25 MG TABS: 25 | 30 days supply | Qty: 90 | Fill #0

## 2017-03-06 MED FILL — lamoTRIgine 200 MG TABS: 200 | 30 days supply | Qty: 60 | Fill #0

## 2017-03-20 DIAGNOSIS — E1165 Type 2 diabetes mellitus with hyperglycemia: Secondary | ICD-10-CM | POA: Diagnosis not present

## 2017-03-20 DIAGNOSIS — Z79899 Other long term (current) drug therapy: Secondary | ICD-10-CM | POA: Diagnosis not present

## 2017-03-20 DIAGNOSIS — M25511 Pain in right shoulder: Secondary | ICD-10-CM | POA: Diagnosis not present

## 2017-03-20 DIAGNOSIS — E559 Vitamin D deficiency, unspecified: Secondary | ICD-10-CM | POA: Diagnosis not present

## 2017-03-20 DIAGNOSIS — E7800 Pure hypercholesterolemia, unspecified: Secondary | ICD-10-CM | POA: Diagnosis not present

## 2017-03-20 DIAGNOSIS — G8929 Other chronic pain: Secondary | ICD-10-CM | POA: Diagnosis not present

## 2017-03-20 DIAGNOSIS — M129 Arthropathy, unspecified: Secondary | ICD-10-CM | POA: Diagnosis not present

## 2017-03-20 DIAGNOSIS — M25512 Pain in left shoulder: Secondary | ICD-10-CM | POA: Diagnosis not present

## 2017-03-20 DIAGNOSIS — M542 Cervicalgia: Secondary | ICD-10-CM | POA: Diagnosis not present

## 2017-03-20 DIAGNOSIS — M545 Low back pain: Secondary | ICD-10-CM | POA: Diagnosis not present

## 2017-04-15 ENCOUNTER — Encounter (HOSPITAL_COMMUNITY): Payer: Self-pay | Admitting: Emergency Medicine

## 2017-04-15 ENCOUNTER — Ambulatory Visit (INDEPENDENT_AMBULATORY_CARE_PROVIDER_SITE_OTHER): Payer: Medicare Other

## 2017-04-15 ENCOUNTER — Ambulatory Visit (HOSPITAL_COMMUNITY)
Admission: EM | Admit: 2017-04-15 | Discharge: 2017-04-15 | Disposition: A | Discharge status: Home / Self Care | Payer: Medicare Other | Attending: Family Medicine | Admitting: Family Medicine

## 2017-04-15 DIAGNOSIS — M79645 Pain in left finger(s): Secondary | ICD-10-CM

## 2017-04-15 MED ORDER — NAPROXEN 375 MG PO TABS: 375.0000 mg | ORAL_TABLET | Freq: Two times a day (BID) | ORAL | 0 refills | Status: DC

## 2017-04-15 MED FILL — VENLAFAXINE HCL ER 150 MG C: 150 | 30 days supply | Qty: 30 | Fill #3

## 2017-04-15 MED FILL — NAPROXEN 375 MG TABLET: 375 | 10 days supply | Qty: 20 | Fill #0

## 2017-04-15 NOTE — ED Provider Notes (Signed)
[facility]    CSN: [id number] Arrival date & time: 04/15/17  1408     History   Chief Complaint Chief Complaint  Patient presents with  . Finger Injury    HPI Amy Glass is a 56 y.o. female.   56 year old female with history of diabetes, hiatal hernia, hypertension comes in for 2 month history of left thumb pain. She states 2 months ago, she was in the kitchen, and used the base of the thumb around the MCP joint to push down on the knife for extensive chopping. She has since than had pain at the MCP joint with movement. Takes ibuprofen 882m on and off for the pain, which helps. States if she isn't taking anything for it, pain can radiate up her arm. Denies numbness/tingling, swelling, spreading erythema, increased warmth. Patient states she has also noticed decrease in grip strength due to the pain.       Past Medical History:  Diagnosis Date  . Allergy   . Anxiety    as child   . Depression    as child  . [place] [address] . Diverticulosis   . Hiatal hernia   . Hyperplastic colon polyp   . Hypertension Dx 2003  . Schatzki's ring     Patient Active Problem List   Diagnosis Date Noted  . HTN (hypertension) 12/26/2016  . Depression 02/08/2016  . Scalp mass 02/08/2016  . Rosacea 02/08/2016  . Right shoulder pain 12/22/2015  . Cerumen impaction 12/22/2015  . Allergic sinusitis 12/22/2015  . Patient desires pregnancy 12/22/2015  . Dysphagia   . Acanthosis nigricans 09/27/2015  . TMJ pain dysfunction syndrome 01/16/2015  . Poor dentition 01/16/2015  . Lumbar degenerative disc disease 01/10/2015  . Vitamin D deficiency 12/05/2014  . High triglycerides 12/05/2014  . Diabetes type 2, controlled ([place] 12/02/2014  . Lateral pain of right hip 12/02/2014  . Numbness and tingling in right hand 12/02/2014    Past Surgical History:  Procedure Laterality Date  . BACK SURGERY  2002   lumbar  . BROW LIFT Bilateral  09/17/2016   Procedure: BLEPHAROPLASTY;  Surgeon: BIrene Limbo MD;  [place]  Service: [place];  Laterality: Bilateral;  . CARPAL TUNNEL RELEASE Right   . CARPAL TUNNEL RELEASE Left 06/2016  . CESAREAN SECTION  05/20/2003   . ESOPHAGEAL MANOMETRY N/A 10/25/2015   Procedure: ESOPHAGEAL MANOMETRY (EM);  Surgeon: JJerene Bears MD;  Location: [facility];  Service: Gastroenterology;  Laterality: N/A;  . PTOSIS REPAIR Bilateral 09/17/2016   Procedure: BILATERAL PTOSIS REPAIR EYELID WITH SUTURE TECHNIQUE, BILATERAL UPPER LID BLEPHAROPLASTY WITH EXCESS SKIN WEIGHING EYELID DOWN.;  Surgeon: BIrene Limbo MD;  [place] [facility]  Service: Plastics;  Laterality: Bilateral;  . SHOULDER SURGERY  08/2013    b/l shoulder   . SHOULDER SURGERY Right 11/2015    OB History    No data available       Home Medications    Prior to Admission medications   Medication Sig Start Date End Date Taking? Authorizing Provider  atorvastatin (LIPITOR) 40 MG tablet TAKE 1 TABLET BY MOUTH EVERY DAY Patient not taking: Reported on 02/13/2017 01/13/17   FBoykin Nearing MD  Blood Glucose Monitoring Suppl (TRUE METRIX METER) w/Device KIT 1 each by Does not apply route as needed. 12/26/16   Funches, JAdriana Mccallum MD  glucose blood (TRUE METRIX BLOOD GLUCOSE TEST) test strip 1 each by Other route daily. 12/26/16  Funches, Josalyn, MD  HYDROcodone-acetaminophen (NORCO) 10-325 MG tablet Take 1 tablet by mouth every 8 (eight) hours as needed. 02/13/17   Argentina Donovan, PA-C  hydrOXYzine (ATARAX/VISTARIL) 25 MG tablet Take 0.5-1 tablets (12.5-25 mg total) by mouth 3 (three) times daily as needed. 12/26/16   Funches, Adriana Mccallum, MD  ketotifen (ZADITOR) 0.025 % ophthalmic solution Place 1 drop into both eyes 2 (two) times daily. 12/26/16   Funches, Adriana Mccallum, MD  lisinopril (PRINIVIL,ZESTRIL) 20 MG tablet Take 1 tablet (20 mg total) by mouth 2 (two) times daily. 12/26/16   Funches, Adriana Mccallum, MD    naproxen (NAPROSYN) 375 MG tablet Take 1 tablet (375 mg total) by mouth 2 (two) times daily. 04/15/17   Tasia Catchings, Amy V, PA-C  traZODone (DESYREL) 50 MG tablet Take 1 tablet (50 mg total) by mouth at bedtime. 12/26/16   Funches, Adriana Mccallum, MD  TRUEPLUS LANCETS 28G MISC 1 each by Does not apply route 3 (three) times daily. 12/26/16   Funches, Adriana Mccallum, MD  venlafaxine XR (EFFEXOR-XR) 150 MG 24 hr capsule TAKE 1 CAPSULE BY MOUTH DAILY WITH BREAKFAST 12/26/16   Boykin Nearing, MD    Family History Family History  Problem Relation Age of Onset  . Diabetes Mother   . Heart disease Mother   . Hyperlipidemia Mother   . Hypertension Mother   . Renal cancer Maternal Aunt   . Stomach cancer Maternal Uncle   . Colon cancer Neg Hx   . Esophageal cancer Neg Hx   . Rectal cancer Neg Hx     Social History Social History  Substance Use Topics  . Smoking status: Never Smoker  . Smokeless tobacco: Never Used  . Alcohol use No     Allergies   Nitroglycerin   Review of Systems Review of Systems  Reason unable to perform ROS: See HPI as above.     Physical Exam Triage Vital Signs ED Triage Vitals  Enc Vitals Group     BP 04/15/17 1518 103/62     Pulse Rate 04/15/17 1518 75     Resp 04/15/17 1518 16     Temp 04/15/17 1518 98.5 F (36.9 C)     Temp Source 04/15/17 1518 Oral     SpO2 04/15/17 1518 99 %     Weight 04/15/17 1518 144 lb (65.3 kg)     Height --      Head Circumference --      Peak Flow --      Pain Score 04/15/17 1519 6     Pain Loc --      Pain Edu? --      Excl. in [place]? --    No data found.   Updated Vital Signs BP 103/62   Pulse 75   Temp 98.5 F (36.9 C) (Oral)   Resp 16   Wt 144 lb (65.3 kg)   SpO2 99%   BMI 29.08 kg/m    Physical Exam  Constitutional: She is oriented to person, place, and time. She appears well-developed and well-nourished. No distress.  HENT:  Head: Normocephalic and atraumatic.  Eyes: Pupils are equal, round, and reactive to light.  Conjunctivae are normal.  Musculoskeletal:  No erythema, swelling, contusions seen. Tenderness on palpation of MCP joint of left thumb. Full ROM of fingers, though with pain at the thumb. Decreased grip strength of the left fingers. Sensation intact. Radial pulses 2+, cap refill <2s  Neurological: She is alert and oriented to person, place, and time.  UC Treatments / Results  Labs (all labs ordered are listed, but only abnormal results are displayed) Labs Reviewed - No data to display  EKG  EKG Interpretation None       Radiology Dg Finger Thumb Left  Result Date: 04/15/2017 CLINICAL DATA:  Persistent pain after injury 2 months prior EXAM: LEFT THUMB 2+V COMPARISON:  None. FINDINGS: Frontal, oblique, and lateral views were obtained. There is no appreciable fracture or dislocation. Joint spaces appear normal. No erosive change. IMPRESSION: No fracture or dislocation.  No evident arthropathy. Electronically Signed   By: Lowella Grip III M.D.   On: 04/15/2017 16:46    Procedures Procedures (including critical care time)  Medications Ordered in UC Medications - No data to display   Initial Impression / Assessment and Plan / UC Course  I have reviewed the triage vital signs and the nursing notes.  Pertinent labs & imaging results that were available during my care of the patient were reviewed by me and considered in my medical decision making (see chart for details).    X-ray negative for fracture or dislocation. Discussed with patient symptoms most likely due to tendinitis. Start NSAIDs. Ice compress. Return precautions given.  Final Clinical Impressions(s) / UC Diagnoses   Final diagnoses:  Finger pain, left    New Prescriptions Discharge Medication List as of 04/15/2017  4:50 PM    START taking these medications   Details  naproxen (NAPROSYN) 375 MG tablet Take 1 tablet (375 mg total) by mouth 2 (two) times daily., Starting Tue 04/15/2017, Normal            Ok Edwards, [name] 04/15/17 2051

## 2017-04-15 NOTE — ED Triage Notes (Signed)
PT hurt her left thumb 2 months ago. PT reports restricted use now.

## 2017-04-15 NOTE — Discharge Instructions (Signed)
Xray negative for dislocation or fracture. Symptoms could be due to inflammation. Start Naproxen as directed. Ice/heat compresses as needed. This can take up to 3-4 weeks to completely resolve, but you should be feeling better each week. Follow up here or with PCP if symptoms worsen, changes for reevaluation.

## 2017-04-22 DIAGNOSIS — F319 Bipolar disorder, unspecified: Secondary | ICD-10-CM | POA: Diagnosis not present

## 2017-04-22 MED FILL — FLUoxetine HCL 20 MG CAPS: 20 | 30 days supply | Qty: 30 | Fill #0 | Status: TO

## 2017-04-22 MED FILL — traZODone HCL 100 MG TABS: 100 | 30 days supply | Qty: 60 | Fill #0 | Status: TO

## 2017-04-22 MED FILL — hydrOXYzine HCL 25 MG TABS: 25 | 30 days supply | Qty: 90 | Fill #0 | Status: TO

## 2017-04-22 MED FILL — DIVALPROEX SOD DR 500 MG TA: 500 | 30 days supply | Qty: 60 | Fill #0 | Status: TO

## 2017-04-30 ENCOUNTER — Encounter (HOSPITAL_COMMUNITY): Payer: Self-pay

## 2017-04-30 ENCOUNTER — Emergency Department (HOSPITAL_COMMUNITY)
Admission: EM | Admit: 2017-04-30 | Discharge: 2017-05-01 | Disposition: A | Discharge status: Home / Self Care | Payer: Medicare Other | Attending: Emergency Medicine | Admitting: Emergency Medicine

## 2017-04-30 DIAGNOSIS — I10 Essential (primary) hypertension: Secondary | ICD-10-CM | POA: Insufficient documentation

## 2017-04-30 DIAGNOSIS — E119 Type 2 diabetes mellitus without complications: Secondary | ICD-10-CM | POA: Insufficient documentation

## 2017-04-30 DIAGNOSIS — R1319 Other dysphagia: Secondary | ICD-10-CM

## 2017-04-30 DIAGNOSIS — R1011 Right upper quadrant pain: Secondary | ICD-10-CM | POA: Diagnosis not present

## 2017-04-30 DIAGNOSIS — Z79899 Other long term (current) drug therapy: Secondary | ICD-10-CM | POA: Insufficient documentation

## 2017-04-30 DIAGNOSIS — K760 Fatty (change of) liver, not elsewhere classified: Secondary | ICD-10-CM | POA: Diagnosis not present

## 2017-04-30 DIAGNOSIS — R1013 Epigastric pain: Secondary | ICD-10-CM | POA: Diagnosis not present

## 2017-04-30 DIAGNOSIS — R1310 Dysphagia, unspecified: Secondary | ICD-10-CM | POA: Diagnosis not present

## 2017-04-30 DIAGNOSIS — R112 Nausea with vomiting, unspecified: Secondary | ICD-10-CM | POA: Diagnosis not present

## 2017-04-30 LAB — COMPREHENSIVE METABOLIC PANEL
ALBUMIN: 4.3 g/dL (ref 3.5–5.0)
ALT: 24 U/L (ref 14–54)
ANION GAP: 11 (ref 5–15)
AST: 25 U/L (ref 15–41)
Alkaline Phosphatase: 82 U/L (ref 38–126)
BUN: 11 mg/dL (ref 6–20)
CHLORIDE: 99 mmol/L — AB (ref 101–111)
CO2: 25 mmol/L (ref 22–32)
Calcium: 9.7 mg/dL (ref 8.9–10.3)
Creatinine, Ser: 0.67 mg/dL (ref 0.44–1.00)
GFR calc non Af Amer: >60 mL/min (ref >60)
Glucose, Bld: 121 mg/dL — ABNORMAL HIGH (ref 65–99)
Potassium: 4.3 mmol/L (ref 3.5–5.1)
SODIUM: 135 mmol/L (ref 135–145)
Total Bilirubin: 0.6 mg/dL (ref 0.3–1.2)
Total Protein: 7.6 g/dL (ref 6.5–8.1)

## 2017-04-30 LAB — URINALYSIS, ROUTINE W REFLEX MICROSCOPIC
BILIRUBIN URINE: NEGATIVE
Glucose, UA: NEGATIVE mg/dL
KETONES UR: 5 mg/dL — AB
Leukocytes, UA: NEGATIVE
Nitrite: NEGATIVE
Protein, ur: 30 mg/dL — AB
Specific Gravity, Urine: 1.019 (ref 1.005–1.030)
pH: 5 (ref 5.0–8.0)

## 2017-04-30 LAB — CBC
HCT: 41.5 % (ref 36.0–46.0)
HEMOGLOBIN: 13.6 g/dL (ref 12.0–15.0)
MCH: 28.3 pg (ref 26.0–34.0)
MCHC: 32.8 g/dL (ref 30.0–36.0)
MCV: 86.3 fL (ref 78.0–100.0)
Platelets: 235 10*3/uL (ref 150–400)
RBC: 4.81 MIL/uL (ref 3.87–5.11)
RDW: 14.2 % (ref 11.5–15.5)
WBC: 6 10*3/uL (ref 4.0–10.5)

## 2017-04-30 LAB — LIPASE, BLOOD: LIPASE: 24 U/L (ref 11–51)

## 2017-04-30 NOTE — ED Triage Notes (Signed)
Pt reports epigastric pain, n/v, and "hard time getting the food down because it feels like its stuck" x 2 days. Pt reports emesis is bright green. Denies diarrhea.

## 2017-05-01 ENCOUNTER — Emergency Department (HOSPITAL_COMMUNITY): Payer: Medicare Other

## 2017-05-01 ENCOUNTER — Ambulatory Visit: Payer: Medicare Other | Admitting: Family Medicine

## 2017-05-01 DIAGNOSIS — K760 Fatty (change of) liver, not elsewhere classified: Secondary | ICD-10-CM | POA: Diagnosis not present

## 2017-05-01 MED ORDER — DICYCLOMINE HCL 20 MG PO TABS: 20.0000 mg | ORAL_TABLET | Freq: Two times a day (BID) | ORAL | 0 refills | Status: DC | PRN

## 2017-05-01 MED ORDER — ONDANSETRON 4 MG PO TBDP
4.0000 mg | ORAL_TABLET | Freq: Three times a day (TID) | ORAL | 0 refills | Status: DC | PRN
Start: 2017-05-01 — End: 2017-08-25

## 2017-05-01 MED ORDER — PANTOPRAZOLE SODIUM 20 MG PO TBEC: 40.0000 mg | DELAYED_RELEASE_TABLET | Freq: Every day | ORAL | 0 refills | Status: DC

## 2017-05-01 MED ORDER — ONDANSETRON 4 MG PO TBDP
4.0000 mg | ORAL_TABLET | Freq: Once | ORAL | Status: AC
  Administered 2017-05-01: 4 mg via ORAL
  Filled 2017-05-01: qty 1

## 2017-05-01 MED ORDER — DICYCLOMINE HCL 10 MG/ML IM SOLN
20.0000 mg | Freq: Once | INTRAMUSCULAR | Status: AC
  Administered 2017-05-01: 20 mg via INTRAMUSCULAR
  Filled 2017-05-01: qty 2

## 2017-05-01 NOTE — ED Provider Notes (Signed)
[facility] DEPT Provider Note   CSN: [id number] Arrival date & time: 04/30/17  2008     History   Chief Complaint Chief Complaint  Patient presents with  . Abdominal Pain  . Emesis    HPI Amy Glass is a 56 y.o. female.  HPI   2 days ago ate ravioli, felt like it didn't go down.  Today was afraid of getting up out of bed because afraid of vomiting. Did vomit once today and it was green.  Reports pain is epigastric radiating around the back bilaterally.  Feels like pain is worse with eating or drinking.  Having normal bowel movements. Pain 8/10, aching.  Pain has been going on for the last 3 days, has had episodes like this before, feeling like the food building up.  Reports drinks a lot of water and gingerale or sprite to get it down in the past.  Feels like this episode is worse.  Reports she has had this in the past. Reports they have evlauated it before but doesn't feel they were able to do anything about it. Goes on to say the symptoms she has had are continuation of the frequent episodes of feeling like food gets stuck in her esophagus that she has worked up multiple times with GI. Reports they told her that they can't find what is wrong, but she knows something is.    Per Dr. Hilarie Fredrickson Jan 2018:  Referral to [facility] for dysphagia despite empiric dilation, normal mano, neg EoE eval, and normal esophagram  Past Medical History:  Diagnosis Date  . Allergy   . Anxiety    as child   . Depression    as child  . Diabetes mellitus without complication ([place]) Dx 2500  . Diverticulosis   . Hiatal hernia   . Hyperplastic colon polyp   . Hypertension Dx 2003  . Schatzki's ring     Patient Active Problem List   Diagnosis Date Noted  . HTN (hypertension) 12/26/2016  . Depression 02/08/2016  . Scalp mass 02/08/2016  . Rosacea 02/08/2016  . Right shoulder pain 12/22/2015  . Cerumen impaction 12/22/2015  . Allergic sinusitis 12/22/2015  . Patient desires  pregnancy 12/22/2015  . Dysphagia   . Acanthosis nigricans 09/27/2015  . TMJ pain dysfunction syndrome 01/16/2015  . Poor dentition 01/16/2015  . Lumbar degenerative disc disease 01/10/2015  . Vitamin D deficiency 12/05/2014  . High triglycerides 12/05/2014  . Diabetes type 2, controlled ([place]) 12/02/2014  . Lateral pain of right hip 12/02/2014  . Numbness and tingling in right hand 12/02/2014    Past Surgical History:  Procedure Laterality Date  . BACK SURGERY  2002   lumbar  . BROW LIFT Bilateral 09/17/2016   Procedure: BLEPHAROPLASTY;  Surgeon: Irene Limbo, MD;  Location: [facility];  Service: Plastics;  Laterality: Bilateral;  . CARPAL TUNNEL RELEASE Right   . CARPAL TUNNEL RELEASE Left 06/2016  . CESAREAN SECTION  05/20/2003   . ESOPHAGEAL MANOMETRY N/A 10/25/2015   Procedure: ESOPHAGEAL MANOMETRY (EM);  Surgeon: Jerene Bears, MD;  [facility];  Service: Gastroenterology;  Laterality: N/A;  . PTOSIS REPAIR Bilateral 09/17/2016   Procedure: BILATERAL PTOSIS REPAIR EYELID WITH SUTURE TECHNIQUE, BILATERAL UPPER LID BLEPHAROPLASTY WITH EXCESS SKIN WEIGHING EYELID DOWN.;  Surgeon: Irene Limbo, MD;  Location: [facility];  Service: Plastics;  Laterality: Bilateral;  . SHOULDER SURGERY  08/2013    b/l shoulder   . SHOULDER SURGERY Right 11/2015  OB History    No data available       Home Medications    Prior to Admission medications   Medication Sig Start Date End Date Taking? Authorizing Provider  Blood Glucose Monitoring Suppl (TRUE METRIX METER) w/Device KIT 1 each by Does not apply route as needed. 12/26/16  Yes Funches, Josalyn, MD  FLUoxetine (PROZAC) 20 MG capsule Take 20 mg by mouth daily.   Yes [provider]  glucose blood (TRUE METRIX BLOOD GLUCOSE TEST) test strip 1 each by Other route daily. 12/26/16  Yes Funches, Josalyn, MD  hydrOXYzine (ATARAX/VISTARIL) 25 MG tablet Take 0.5-1 tablets (12.5-25 mg total) by  mouth 3 (three) times daily as needed. Patient taking differently: Take 12.5-25 mg by mouth 3 (three) times daily as needed for anxiety.  12/26/16  Yes Funches, Josalyn, MD  ketotifen (ZADITOR) 0.025 % ophthalmic solution Place 1 drop into both eyes 2 (two) times daily. Patient taking differently: Place 1 drop into both eyes 2 (two) times daily as needed (allergies).  12/26/16  Yes Funches, Josalyn, MD  lamoTRIgine (LAMICTAL) 200 MG tablet Take 200 mg by mouth at bedtime.   Yes [provider]  lisinopril (PRINIVIL,ZESTRIL) 20 MG tablet Take 1 tablet (20 mg total) by mouth 2 (two) times daily. Patient taking differently: Take 20 mg by mouth daily.  12/26/16  Yes Funches, Josalyn, MD  naproxen (NAPROSYN) 375 MG tablet Take 1 tablet (375 mg total) by mouth 2 (two) times daily. Patient taking differently: Take 375 mg by mouth 2 (two) times daily as needed for moderate pain.  04/15/17  Yes Yu, Amy V, PA-C  traZODone (DESYREL) 50 MG tablet Take 1 tablet (50 mg total) by mouth at bedtime. 12/26/16  Yes Funches, Josalyn, MD  TRUEPLUS LANCETS 28G MISC 1 each by Does not apply route 3 (three) times daily. 12/26/16  Yes Funches, Josalyn, MD  venlafaxine XR (EFFEXOR-XR) 150 MG 24 hr capsule TAKE 1 CAPSULE BY MOUTH DAILY WITH BREAKFAST 12/26/16  Yes Funches, Josalyn, MD  dicyclomine (BENTYL) 20 MG tablet Take 1 tablet (20 mg total) by mouth 2 (two) times daily as needed for spasms (abdominal pain). 05/01/17   Gareth Morgan, MD  ondansetron (ZOFRAN ODT) 4 MG disintegrating tablet Take 1 tablet (4 mg total) by mouth every 8 (eight) hours as needed for nausea or vomiting. 05/01/17   Gareth Morgan, MD  pantoprazole (PROTONIX) 20 MG tablet Take 2 tablets (40 mg total) by mouth daily. 05/01/17 05/15/17  Gareth Morgan, MD    Family History Family History  Problem Relation Age of Onset  . Diabetes Mother   . Heart disease Mother   . Hyperlipidemia Mother   . Hypertension Mother   . Renal cancer Maternal  Aunt   . Stomach cancer Maternal Uncle   . Colon cancer Neg Hx   . Esophageal cancer Neg Hx   . Rectal cancer Neg Hx     Social History Social History  Substance Use Topics  . Smoking status: Never Smoker  . Smokeless tobacco: Never Used  . Alcohol use No     Allergies   Nitroglycerin   Review of Systems Review of Systems  Constitutional: Negative for fever.  HENT: Positive for trouble swallowing. Negative for sore throat.   Eyes: Negative for visual disturbance.  Respiratory: Negative for cough and shortness of breath.   Cardiovascular: Negative for chest pain.  Gastrointestinal: Positive for abdominal pain, constipation, nausea and vomiting.  Genitourinary: Negative for difficulty urinating.  Musculoskeletal: Negative  for back pain and neck pain.  Skin: Negative for rash.  Neurological: Negative for syncope and headaches.     Physical Exam Updated Vital Signs BP (!) 140/91   Pulse 66   Temp 98.5 F (36.9 C) (Oral)   Resp 18   Ht '4\' 11"'  (1.499 m)   Wt 68 kg (150 lb)   SpO2 98%   BMI 30.30 kg/m   Physical Exam  Constitutional: She is oriented to person, place, and time. She appears well-developed and well-nourished. No distress.  HENT:  Head: Normocephalic and atraumatic.  Eyes: Conjunctivae and EOM are normal.  Neck: Normal range of motion.  Cardiovascular: Normal rate, regular rhythm, normal heart sounds and intact distal pulses.  Exam reveals no gallop and no friction rub.   No murmur heard. Pulmonary/Chest: Effort normal and breath sounds normal. No respiratory distress. She has no wheezes. She has no rales.  Abdominal: Soft. She exhibits no distension. There is tenderness (RUQ and epigastric, right cva tenderness). There is no guarding.  Musculoskeletal: She exhibits no edema or tenderness.  Neurological: She is alert and oriented to person, place, and time.  Skin: Skin is warm and dry. No rash noted. She is not diaphoretic. No erythema.  Nursing note  and vitals reviewed.    ED Treatments / Results  Labs (all labs ordered are listed, but only abnormal results are displayed) Labs Reviewed  COMPREHENSIVE METABOLIC PANEL - Abnormal; Notable for the following:       Result Value   Chloride 99 (*)    Glucose, Bld 121 (*)    All other components within normal limits  URINALYSIS, ROUTINE W REFLEX MICROSCOPIC - Abnormal; Notable for the following:    APPearance HAZY (*)    Hgb urine dipstick SMALL (*)    Ketones, ur 5 (*)    Protein, ur 30 (*)    Bacteria, UA RARE (*)    Squamous Epithelial / LPF 0-5 (*)    All other components within normal limits  LIPASE, BLOOD  CBC    EKG  EKG Interpretation None       Radiology US Abdomen Limited Ruq  Result Date: 05/01/2017 CLINICAL DATA:  Right upper quadrant abdominal pain for 4 years. 3 day history of nausea. EXAM: ULTRASOUND ABDOMEN LIMITED RIGHT UPPER QUADRANT COMPARISON:  None. FINDINGS: Gallbladder: No gallstones or wall thickening visualized. No sonographic Murphy sign noted by sonographer. Common bile duct: Diameter: 4.3 mm Liver: Mildly increased hepatic parenchymal echogenicity, suggesting fatty infiltration. No focal liver lesion. Portal vein is patent on color Doppler imaging with normal direction of blood flow towards the liver. IMPRESSION: Hepatic steatosis.  Normal gallbladder and bile ducts. Electronically Signed   By: Andreas Newport M.D.   On: 05/01/2017 02:42    Procedures Procedures (including critical care time)  Medications Ordered in ED Medications  dicyclomine (BENTYL) injection 20 mg (20 mg Intramuscular Given 05/01/17 0146)  ondansetron (ZOFRAN-ODT) disintegrating tablet 4 mg (4 mg Oral Given 05/01/17 0146)     Initial Impression / Assessment and Plan / ED Course  I have reviewed the triage vital signs and the nursing notes.  Pertinent labs & imaging results that were available during my care of the patient were reviewed by me and considered in my  medical decision making (see chart for details).     56 year old female with a history of hypertension, depression, Schatzki's ring, recurrent esophageal dysphagia for which she's been evaluated by our gastroenterology presents with concern for feeling of food  getting stuck in lower esophagus with abdominal pain.  Regarding abdominal pain--lipase WNL. No sign of appendicitis or diverticulitis. RUQ [place] only with fatty liver.  Pt having BM, no distention and doubt SBO or acute obstruction.  Regarding dysphagia, she has been undergoing work up for this with GI and GI was referring her to [facility] esophageal specialists given normal testing so far.  She is able to tolerate drinking gingerale in the ED without vomiting or spitting up, and has no signs of food impaction. Recommend continued outpatient follow up. Given rx for zofran, protonix, and bentyl. Patient discharged in stable condition with understanding of reasons to return.   Final Clinical Impressions(s) / ED Diagnoses   Final diagnoses:  RUQ abdominal pain  Epigastric abdominal pain  Esophageal dysphagia  Hepatic steatosis    New Prescriptions Discharge Medication List as of 05/01/2017  3:40 AM    START taking these medications   Details  dicyclomine (BENTYL) 20 MG tablet Take 1 tablet (20 mg total) by mouth 2 (two) times daily as needed for spasms (abdominal pain)., Starting Thu 05/01/2017, Print    ondansetron (ZOFRAN ODT) 4 MG disintegrating tablet Take 1 tablet (4 mg total) by mouth every 8 (eight) hours as needed for nausea or vomiting., Starting Thu 05/01/2017, Print    pantoprazole (PROTONIX) 20 MG tablet Take 2 tablets (40 mg total) by mouth daily., Starting Thu 05/01/2017, Until Thu 05/15/2017, Print         Gareth Morgan, MD 05/01/17 2018

## 2017-05-09 MED FILL — VENLAFAXINE HCL ER 150 MG C: 150 | 30 days supply | Qty: 30 | Fill #4

## 2017-05-09 MED FILL — LISINOPRIL 20 MG TAB: 20 | 90 days supply | Qty: 180 | Fill #2 | Status: TO

## 2017-05-19 MED FILL — PANTOPRAZOLE SOD DR 20 MG T: 20 | 14 days supply | Qty: 28 | Fill #0

## 2017-05-19 MED FILL — ?DICYCLOMINE 20 MG TABLET: 20 | 10 days supply | Qty: 20 | Fill #0

## 2017-05-19 MED FILL — ONDANSETRON ODT 4 MG TABLET: 4 | 6 days supply | Qty: 20 | Fill #0

## 2017-05-21 MED FILL — IBUPROFEN 800 MG TABLET: 800 | 5 days supply | Qty: 20 | Fill #0

## 2017-05-21 MED FILL — AMOXICILLIN 500 MG CAPSULE: 500 | 10 days supply | Qty: 30 | Fill #0

## 2017-06-03 DIAGNOSIS — J302 Other seasonal allergic rhinitis: Secondary | ICD-10-CM | POA: Diagnosis not present

## 2017-06-03 DIAGNOSIS — E785 Hyperlipidemia, unspecified: Secondary | ICD-10-CM | POA: Diagnosis not present

## 2017-06-03 DIAGNOSIS — E1165 Type 2 diabetes mellitus with hyperglycemia: Secondary | ICD-10-CM | POA: Diagnosis not present

## 2017-06-03 DIAGNOSIS — H538 Other visual disturbances: Secondary | ICD-10-CM | POA: Diagnosis not present

## 2017-06-03 DIAGNOSIS — Z01118 Encounter for examination of ears and hearing with other abnormal findings: Secondary | ICD-10-CM | POA: Diagnosis not present

## 2017-06-03 DIAGNOSIS — F418 Other specified anxiety disorders: Secondary | ICD-10-CM | POA: Diagnosis not present

## 2017-06-03 DIAGNOSIS — I10 Essential (primary) hypertension: Secondary | ICD-10-CM | POA: Diagnosis not present

## 2017-06-03 DIAGNOSIS — E669 Obesity, unspecified: Secondary | ICD-10-CM | POA: Diagnosis not present

## 2017-06-03 DIAGNOSIS — Z136 Encounter for screening for cardiovascular disorders: Secondary | ICD-10-CM | POA: Diagnosis not present

## 2017-06-03 DIAGNOSIS — K635 Polyp of colon: Secondary | ICD-10-CM | POA: Diagnosis not present

## 2017-06-03 DIAGNOSIS — Z5181 Encounter for therapeutic drug level monitoring: Secondary | ICD-10-CM | POA: Diagnosis not present

## 2017-06-03 DIAGNOSIS — E559 Vitamin D deficiency, unspecified: Secondary | ICD-10-CM | POA: Diagnosis not present

## 2017-06-17 ENCOUNTER — Ambulatory Visit: Payer: Medicare Other | Admitting: Internal Medicine

## 2017-06-24 MED FILL — VENLAFAXINE HCL ER 150 MG C: 150 | 30 days supply | Qty: 30 | Fill #5 | Status: TO

## 2017-06-25 ENCOUNTER — Telehealth: Payer: Self-pay | Admitting: Family Medicine

## 2017-06-25 NOTE — Telephone Encounter (Signed)
Pt called to request a refill for  -JANUMET 50-500 MG tablet  Please follow up

## 2017-06-25 NOTE — Telephone Encounter (Signed)
This medication was d/ced in the spring due to nonadherence. She has no showed multiple visits. She will have to be seen to evaluate need. Patient on schedule for walk in tomorrow, can address then.

## 2017-06-26 ENCOUNTER — Ambulatory Visit: Payer: Medicare Other | Attending: Internal Medicine | Admitting: Physician Assistant

## 2017-06-26 VITALS — BP 112/77 | HR 94 | Temp 98.0°F | Resp 18 | Ht 59.0 in | Wt 154.6 lb

## 2017-06-26 DIAGNOSIS — Z7984 Long term (current) use of oral hypoglycemic drugs: Secondary | ICD-10-CM | POA: Insufficient documentation

## 2017-06-26 DIAGNOSIS — M5136 Other intervertebral disc degeneration, lumbar region: Secondary | ICD-10-CM | POA: Diagnosis not present

## 2017-06-26 DIAGNOSIS — I10 Essential (primary) hypertension: Secondary | ICD-10-CM | POA: Insufficient documentation

## 2017-06-26 DIAGNOSIS — Z79899 Other long term (current) drug therapy: Secondary | ICD-10-CM | POA: Diagnosis not present

## 2017-06-26 DIAGNOSIS — E118 Type 2 diabetes mellitus with unspecified complications: Secondary | ICD-10-CM | POA: Diagnosis not present

## 2017-06-26 LAB — GLUCOSE, POCT (MANUAL RESULT ENTRY): POC GLUCOSE: 165 mg/dL — AB (ref 70–99)

## 2017-06-26 LAB — POCT GLYCOSYLATED HEMOGLOBIN (HGB A1C): Hemoglobin A1C: 7.8

## 2017-06-26 MED ORDER — HYDROCODONE-ACETAMINOPHEN 10-325 MG PO TABS: 1.0000 | ORAL_TABLET | Freq: Three times a day (TID) | ORAL | 0 refills | Status: DC | PRN

## 2017-06-26 MED ORDER — SITAGLIPTIN PHOS-METFORMIN HCL 50-500 MG PO TABS: 1.0000 | ORAL_TABLET | Freq: Two times a day (BID) | ORAL | 2 refills | Status: DC

## 2017-06-26 NOTE — Progress Notes (Signed)
Patient ID: Amy Glass, female   DOB: 1960/10/30, 56 y.o.   MRN: [id number]   Amy Glass, is a 56 y.o. female  NWG:[id number]  [id number]:[id number]  DOB - November 05, 1960  Subjective:  Chief Complaint and HPI: Amy Glass is a 56 y.o. female here today for uncontrolled diabetes.  She has been off of janumet for a while.  Can't tolerate plain metformin but she does fine on Janumet.  She has noticed over the last month or 2 that her vision has been going blurry at times when she is watching TV, so, the other day, she decided to check her blood sugar and it was 290.  She went through a divorce recently and admits to poor self-care.  She has seen an ophthalmologist within the last year.  She also needs a referral for pain management.  When I saw her back in July, the pain clinic she went to in [place] had closed.  I did a [place] search at that time and there was no misuse of her meds.  She is asking for a RF of hydrocodone today.  She has chronic neck and back pain and has had surgeries in the past.    ROS:   Constitutional:  No f/c, No night sweats, No unexplained weight loss. EENT:  No vision changes, No blurry vision, No hearing changes. No mouth, throat, or ear problems.  Respiratory: No cough, No SOB Cardiac: No CP, no palpitations GI:  No abd pain, No N/V/D. GU: No Urinary s/sx Musculoskeletal: neck, back, and shoulder pain.   Neuro: No headache, no dizziness, no motor weakness.  Skin: No rash Endocrine:  No polydipsia. No polyuria.  Psych: Denies SI/HI  No problems updated.  ALLERGIES: Allergies  Allergen Reactions  . Nitroglycerin Other (See Comments)    Migraines, (only tried nitro patch. Has never tried the pills)    PAST MEDICAL HISTORY: Past Medical History:  Diagnosis Date  . Allergy   . Anxiety    as child   . Depression    as child  . Diabetes mellitus without complication ([place]) Dx 4132  . Diverticulosis   . Hiatal hernia   . Hyperplastic colon polyp    . Hypertension Dx 2003  . Schatzki's ring     MEDICATIONS AT HOME: Prior to Admission medications   Medication Sig Start Date End Date Taking? Authorizing Provider  Blood Glucose Monitoring Suppl (TRUE METRIX METER) w/Device KIT 1 each by Does not apply route as needed. 12/26/16   Funches, Adriana Mccallum, MD  dicyclomine (BENTYL) 20 MG tablet Take 1 tablet (20 mg total) by mouth 2 (two) times daily as needed for spasms (abdominal pain). 05/01/17   Gareth Morgan, MD  FLUoxetine (PROZAC) 20 MG capsule Take 20 mg by mouth daily.    [provider]  glucose blood (TRUE METRIX BLOOD GLUCOSE TEST) test strip 1 each by Other route daily. 12/26/16   Funches, Adriana Mccallum, MD  HYDROcodone-acetaminophen (NORCO) 10-325 MG tablet Take 1 tablet by mouth every 8 (eight) hours as needed. 06/26/17   Argentina Donovan, PA-C  hydrOXYzine (ATARAX/VISTARIL) 25 MG tablet Take 0.5-1 tablets (12.5-25 mg total) by mouth 3 (three) times daily as needed. Patient taking differently: Take 12.5-25 mg by mouth 3 (three) times daily as needed for anxiety.  12/26/16   Funches, Adriana Mccallum, MD  ketotifen (ZADITOR) 0.025 % ophthalmic solution Place 1 drop into both eyes 2 (two) times daily. Patient taking differently: Place 1 drop into both eyes 2 (two) times daily  as needed (allergies).  12/26/16   Funches, Adriana Mccallum, MD  lamoTRIgine (LAMICTAL) 200 MG tablet Take 200 mg by mouth at bedtime.    [provider]  lisinopril (PRINIVIL,ZESTRIL) 20 MG tablet Take 1 tablet (20 mg total) by mouth 2 (two) times daily. Patient taking differently: Take 20 mg by mouth daily.  12/26/16   Funches, Adriana Mccallum, MD  naproxen (NAPROSYN) 375 MG tablet Take 1 tablet (375 mg total) by mouth 2 (two) times daily. Patient taking differently: Take 375 mg by mouth 2 (two) times daily as needed for moderate pain.  04/15/17   Tasia Catchings, Amy V, PA-C  ondansetron (ZOFRAN ODT) 4 MG disintegrating tablet Take 1 tablet (4 mg total) by mouth every 8 (eight) hours as needed for  nausea or vomiting. 05/01/17   Gareth Morgan, MD  pantoprazole (PROTONIX) 20 MG tablet Take 2 tablets (40 mg total) by mouth daily. 05/01/17 05/15/17  Gareth Morgan, MD  sitaGLIPtin-metformin (JANUMET) 50-500 MG tablet Take 1 tablet by mouth 2 (two) times daily with a meal. 06/26/17   McClung, Dionne Bucy, PA-C  traZODone (DESYREL) 50 MG tablet Take 1 tablet (50 mg total) by mouth at bedtime. 12/26/16   Funches, Adriana Mccallum, MD  TRUEPLUS LANCETS 28G MISC 1 each by Does not apply route 3 (three) times daily. 12/26/16   Funches, Adriana Mccallum, MD  venlafaxine XR (EFFEXOR-XR) 150 MG 24 hr capsule TAKE 1 CAPSULE BY MOUTH DAILY WITH BREAKFAST 12/26/16   Funches, Adriana Mccallum, MD     Objective:  EXAM:   Vitals:   06/26/17 1409  BP: 112/77  Pulse: 94  Resp: 18  Temp: 98 F (36.7 C)  TempSrc: Oral  SpO2: 98%  Weight: 154 lb 9.6 oz (70.1 kg)  Height: 4' 11" (1.499 m)    General appearance : A&OX3. NAD. Non-toxic-appearing HEENT: Atraumatic and Normocephalic.  PERRLA. EOM intact.   Neck: supple, no JVD. No cervical lymphadenopathy. No thyromegaly Chest/Lungs:  Breathing-non-labored, Good air entry bilaterally, breath sounds normal without rales, rhonchi, or wheezing  CVS: S1 S2 regular, no murmurs, gallops, rubs  Extremities: Bilateral Lower Ext shows no edema, both legs are warm to touch with = pulse throughout Neurology:  CN II-XII grossly intact, Non focal.   Psych:  TP linear. J/I WNL. Normal speech. Appropriate eye contact and affect.  Skin:  No Rash  Data Review Lab Results  Component Value Date   HGBA1C 7.8 06/26/2017   HGBA1C 6.2 12/26/2016   HGBA1C 6.1 05/24/2016     Assessment & Plan   1. Controlled type 2 diabetes mellitus with complication, without long-term current use of insulin ([place]) Not controlled. Needs to resume medications and work on diet, exercise, and self-care!  Check fasting and bedtime blood sugars daily and record and bring to next visit.   - Glucose (CBG) - HgB A1c -  sitaGLIPtin-metformin (JANUMET) 50-500 MG tablet; Take 1 tablet by mouth 2 (two) times daily with a meal.  Dispense: 60 tablet; Refill: 2  2. Lumbar degenerative disc disease And ongoing neck and shoulder issues - Ambulatory referral to Pain Clinic No h/o abuse-use sparingly- HYDROcodone-acetaminophen (NORCO) 10-325 MG tablet; Take 1 tablet by mouth every 8 (eight) hours as needed.  Dispense: 30 tablet; Refill: 0  Patient have been counseled extensively about nutrition and exercise  Return in about 2 months (around 08/27/2017) for  assign new PCP; followup DM .  The patient was given clear instructions to go to ER or return to medical center if symptoms don't improve, worsen or  new problems develop. The patient verbalized understanding. The patient was told to call to get lab results if they haven't heard anything in the next week.     Freeman Caldron, PA-C [facility], [place]   06/26/2017, 3:27 PM

## 2017-06-27 MED FILL — **JANUMET 50-500 MG TABLET: 50-500 | 28 days supply | Qty: 56 | Fill #0 | Status: TO

## 2017-07-05 ENCOUNTER — Emergency Department (HOSPITAL_COMMUNITY)
Admission: EM | Admit: 2017-07-05 | Discharge: 2017-07-05 | Disposition: A | Discharge status: Home / Self Care | Payer: Medicare Other | Attending: Emergency Medicine | Admitting: Emergency Medicine

## 2017-07-05 ENCOUNTER — Other Ambulatory Visit: Payer: Self-pay

## 2017-07-05 DIAGNOSIS — W108XXA Fall (on) (from) other stairs and steps, initial encounter: Secondary | ICD-10-CM | POA: Diagnosis not present

## 2017-07-05 DIAGNOSIS — S60416A Abrasion of right little finger, initial encounter: Secondary | ICD-10-CM | POA: Diagnosis not present

## 2017-07-05 DIAGNOSIS — I10 Essential (primary) hypertension: Secondary | ICD-10-CM | POA: Diagnosis not present

## 2017-07-05 DIAGNOSIS — Z79899 Other long term (current) drug therapy: Secondary | ICD-10-CM | POA: Insufficient documentation

## 2017-07-05 DIAGNOSIS — W208XXA Other cause of strike by thrown, projected or falling object, initial encounter: Secondary | ICD-10-CM | POA: Insufficient documentation

## 2017-07-05 DIAGNOSIS — Y9389 Activity, other specified: Secondary | ICD-10-CM | POA: Insufficient documentation

## 2017-07-05 DIAGNOSIS — Y999 Unspecified external cause status: Secondary | ICD-10-CM | POA: Insufficient documentation

## 2017-07-05 DIAGNOSIS — Y92009 Unspecified place in unspecified non-institutional (private) residence as the place of occurrence of the external cause: Secondary | ICD-10-CM | POA: Diagnosis not present

## 2017-07-05 DIAGNOSIS — E119 Type 2 diabetes mellitus without complications: Secondary | ICD-10-CM | POA: Insufficient documentation

## 2017-07-05 DIAGNOSIS — S60041A Contusion of right ring finger without damage to nail, initial encounter: Secondary | ICD-10-CM | POA: Insufficient documentation

## 2017-07-05 DIAGNOSIS — Z7984 Long term (current) use of oral hypoglycemic drugs: Secondary | ICD-10-CM | POA: Diagnosis not present

## 2017-07-05 DIAGNOSIS — S67194A Crushing injury of right ring finger, initial encounter: Secondary | ICD-10-CM | POA: Diagnosis not present

## 2017-07-05 DIAGNOSIS — S6710XA Crushing injury of unspecified finger(s), initial encounter: Secondary | ICD-10-CM

## 2017-07-05 DIAGNOSIS — S6000XA Contusion of unspecified finger without damage to nail, initial encounter: Secondary | ICD-10-CM

## 2017-07-05 DIAGNOSIS — S60051A Contusion of right little finger without damage to nail, initial encounter: Secondary | ICD-10-CM | POA: Diagnosis not present

## 2017-07-05 LAB — CBG MONITORING, ED: Glucose-Capillary: 209 mg/dL — ABNORMAL HIGH (ref 65–99)

## 2017-07-05 MED ORDER — IBUPROFEN 600 MG PO TABS: 600.0000 mg | ORAL_TABLET | Freq: Four times a day (QID) | ORAL | 0 refills | Status: DC | PRN

## 2017-07-05 NOTE — ED Provider Notes (Signed)
[facility] EMERGENCY DEPARTMENT Provider Note   CSN: [id number] Arrival date & time: 07/05/17  2216     History   Chief Complaint Chief Complaint  Patient presents with  . Finger Injury    HPI Amy Glass is a 56 y.o. female.  HPI   56 year old female with history of diabetes presenting for evaluation of right hand injury.  Patient was carrying a marble slab for table this afternoon around 2 PM.  The SLAP slipped and smashed her fourth and fifth finger on the right hand against the stepped.  She report acute onset of throbbing pain which she rates at 7 out of 10.  She also noticed significant bruising to the tip of her fourth digit and was concerned.  She also suffered some skin tear to her fifth finger but is up-to-date with tetanus.  She denies any numbness and denies any other injury.  She is right-hand dominant.  Past Medical History:  Diagnosis Date  . Allergy   . Anxiety    as child   . Depression    as child  . Diabetes mellitus without complication ([place]) Dx 6378  . Diverticulosis   . Hiatal hernia   . Hyperplastic colon polyp   . Hypertension Dx 2003  . Schatzki's ring     Patient Active Problem List   Diagnosis Date Noted  . HTN (hypertension) 12/26/2016  . Depression 02/08/2016  . Scalp mass 02/08/2016  . Rosacea 02/08/2016  . Right shoulder pain 12/22/2015  . Cerumen impaction 12/22/2015  . Allergic sinusitis 12/22/2015  . Patient desires pregnancy 12/22/2015  . Dysphagia   . Acanthosis nigricans 09/27/2015  . TMJ pain dysfunction syndrome 01/16/2015  . Poor dentition 01/16/2015  . Lumbar degenerative disc disease 01/10/2015  . Vitamin D deficiency 12/05/2014  . High triglycerides 12/05/2014  . Diabetes type 2, controlled ([place]) 12/02/2014  . Lateral pain of right hip 12/02/2014  . Numbness and tingling in right hand 12/02/2014    Past Surgical History:  Procedure Laterality Date  . BACK SURGERY  2002   lumbar  . BROW  LIFT Bilateral 09/17/2016   Procedure: BLEPHAROPLASTY;  Surgeon: Irene Limbo, MD;  Location: [facility];  Service: Plastics;  Laterality: Bilateral;  . CARPAL TUNNEL RELEASE Right   . CARPAL TUNNEL RELEASE Left 06/2016  . CESAREAN SECTION  05/20/2003   . ESOPHAGEAL MANOMETRY N/A 10/25/2015   Procedure: ESOPHAGEAL MANOMETRY (EM);  Surgeon: Jerene Bears, MD;  Location: [facility];  Service: Gastroenterology;  Laterality: N/A;  . PTOSIS REPAIR Bilateral 09/17/2016   Procedure: BILATERAL PTOSIS REPAIR EYELID WITH SUTURE TECHNIQUE, BILATERAL UPPER LID BLEPHAROPLASTY WITH EXCESS SKIN WEIGHING EYELID DOWN.;  Surgeon: Irene Limbo, MD;  Location: [facility];  Service: Plastics;  Laterality: Bilateral;  . SHOULDER SURGERY  08/2013    b/l shoulder   . SHOULDER SURGERY Right 11/2015    OB History    No data available       Home Medications    Prior to Admission medications   Medication Sig Start Date End Date Taking? Authorizing Provider  Blood Glucose Monitoring Suppl (TRUE METRIX METER) w/Device KIT 1 each by Does not apply route as needed. 12/26/16   Funches, Adriana Mccallum, MD  dicyclomine (BENTYL) 20 MG tablet Take 1 tablet (20 mg total) by mouth 2 (two) times daily as needed for spasms (abdominal pain). 05/01/17   Gareth Morgan, MD  FLUoxetine (PROZAC) 20 MG capsule Take 20 mg by mouth  daily.    [provider]  glucose blood (TRUE METRIX BLOOD GLUCOSE TEST) test strip 1 each by Other route daily. 12/26/16   Funches, Adriana Mccallum, MD  HYDROcodone-acetaminophen (NORCO) 10-325 MG tablet Take 1 tablet by mouth every 8 (eight) hours as needed. 06/26/17   Argentina Donovan, PA-C  hydrOXYzine (ATARAX/VISTARIL) 25 MG tablet Take 0.5-1 tablets (12.5-25 mg total) by mouth 3 (three) times daily as needed. Patient taking differently: Take 12.5-25 mg by mouth 3 (three) times daily as needed for anxiety.  12/26/16   Funches, Adriana Mccallum, MD  ketotifen (ZADITOR) 0.025 %  ophthalmic solution Place 1 drop into both eyes 2 (two) times daily. Patient taking differently: Place 1 drop into both eyes 2 (two) times daily as needed (allergies).  12/26/16   Funches, Adriana Mccallum, MD  lamoTRIgine (LAMICTAL) 200 MG tablet Take 200 mg by mouth at bedtime.    [provider]  lisinopril (PRINIVIL,ZESTRIL) 20 MG tablet Take 1 tablet (20 mg total) by mouth 2 (two) times daily. Patient taking differently: Take 20 mg by mouth daily.  12/26/16   Funches, Adriana Mccallum, MD  naproxen (NAPROSYN) 375 MG tablet Take 1 tablet (375 mg total) by mouth 2 (two) times daily. Patient taking differently: Take 375 mg by mouth 2 (two) times daily as needed for moderate pain.  04/15/17   Tasia Catchings, Amy V, PA-C  ondansetron (ZOFRAN ODT) 4 MG disintegrating tablet Take 1 tablet (4 mg total) by mouth every 8 (eight) hours as needed for nausea or vomiting. 05/01/17   Gareth Morgan, MD  pantoprazole (PROTONIX) 20 MG tablet Take 2 tablets (40 mg total) by mouth daily. 05/01/17 05/15/17  Gareth Morgan, MD  sitaGLIPtin-metformin (JANUMET) 50-500 MG tablet Take 1 tablet by mouth 2 (two) times daily with a meal. 06/26/17   McClung, Dionne Bucy, PA-C  traZODone (DESYREL) 50 MG tablet Take 1 tablet (50 mg total) by mouth at bedtime. 12/26/16   Funches, Adriana Mccallum, MD  TRUEPLUS LANCETS 28G MISC 1 each by Does not apply route 3 (three) times daily. 12/26/16   Funches, Adriana Mccallum, MD  venlafaxine XR (EFFEXOR-XR) 150 MG 24 hr capsule TAKE 1 CAPSULE BY MOUTH DAILY WITH BREAKFAST 12/26/16   Boykin Nearing, MD    Family History Family History  Problem Relation Age of Onset  . Diabetes Mother   . Heart disease Mother   . Hyperlipidemia Mother   . Hypertension Mother   . Renal cancer Maternal Aunt   . Stomach cancer Maternal Uncle   . Colon cancer Neg Hx   . Esophageal cancer Neg Hx   . Rectal cancer Neg Hx     Social History Social History   Tobacco Use  . Smoking status: Never Smoker  . Smokeless tobacco: Never Used    Substance Use Topics  . Alcohol use: No    Alcohol/week: 0.0 oz  . Drug use: No     Allergies   Nitroglycerin   Review of Systems Review of Systems  Skin: Positive for wound.  Neurological: Negative for numbness.     Physical Exam Updated Vital Signs BP 128/75 (BP Location: Left Arm)   Pulse 82   Temp 97.9 F (36.6 C) (Oral)   Resp 16   Ht '4\' 11"'  (1.499 m)   Wt 68 kg (150 lb)   LMP 05/30/2015   SpO2 97%   BMI 30.30 kg/m   Physical Exam  Constitutional: She appears well-developed and well-nourished. No distress.  HENT:  Head: Atraumatic.  Eyes: Conjunctivae are normal.  Neck: Neck supple.  Musculoskeletal: She exhibits tenderness (Right hand: Ecchymosis noted to the pad of fourth finger with tenderness to palpation but no joint involvement and no nail involvement.  Sensation is intact.  Superficial skin tear noted to the volar and dorsum of fifth finger with full range of motion ).  Neurological: She is alert.  Skin: No rash noted.  Psychiatric: She has a normal mood and affect.  Nursing note and vitals reviewed.    ED Treatments / Results  Labs (all labs ordered are listed, but only abnormal results are displayed) Labs Reviewed  CBG MONITORING, ED - Abnormal; Notable for the following components:      Result Value   Glucose-Capillary 209 (*)    All other components within normal limits    EKG  EKG Interpretation None       Radiology No results found.  Procedures Procedures (including critical care time)  Medications Ordered in ED Medications - No data to display   Initial Impression / Assessment and Plan / ED Course  I have reviewed the triage vital signs and the nursing notes.  Pertinent labs & imaging results that were available during my care of the patient were reviewed by me and considered in my medical decision making (see chart for details).     BP 128/75 (BP Location: Left Arm)   Pulse 82   Temp 97.9 F (36.6 C) (Oral)    Resp 16   Ht '4\' 11"'  (1.499 m)   Wt 68 kg (150 lb)   LMP 05/30/2015   SpO2 97%   BMI 30.30 kg/m    Final Clinical Impressions(s) / ED Diagnoses   Final diagnoses:  Crushing injury of finger, initial encounter  Contusion of finger without damage to nail, unspecified finger, initial encounter    ED Discharge Orders        Ordered    ibuprofen (ADVIL,MOTRIN) 600 MG tablet  Every 6 hours PRN     07/05/17 2327     11:25 PM Patient here with crush injury to her right dominant hand.  Injury is specific to her fourth and fifth finger.  On her fourth finger she had an ecchymosis to the pad of her distal finger without joint involvement.  On the fifth finger she has some small skin tear which does not require laceration repair.  I offered x-ray but patient declined.  I have low suspicion for acute fractures or dislocation.  Will place the finger in a finger splint for protection.  [place] therapy discussed.   Domenic Moras, PA-C 07/05/17 2330    Domenic Moras, PA-C 07/05/17 8185    Ezequiel Essex, MD 07/06/17 [phone]

## 2017-07-05 NOTE — ED Triage Notes (Addendum)
Pt states that approx 2pm today, pt states that she was taking marble table into home and tripped on the stairs causing a loss of balance and the table smashed R 4th and 5th finger on the ground. Pt's 4th digit appears purplish blue and 5th digit has open lacerations and some purplish blue areas as well.  Pt states that she is a diabetic and was concerned. Pt states that pain is 7/10. CBG 209

## 2017-07-17 ENCOUNTER — Telehealth: Payer: Self-pay

## 2017-07-17 NOTE — Telephone Encounter (Signed)
Pt. Called requesting for her pain medicine referral to be sent to  Attn: [name] Fax:[phone]  Phone: [phone]

## 2017-07-18 DIAGNOSIS — M65312 Trigger thumb, left thumb: Secondary | ICD-10-CM | POA: Diagnosis not present

## 2017-07-18 DIAGNOSIS — M79644 Pain in right finger(s): Secondary | ICD-10-CM | POA: Diagnosis not present

## 2017-07-18 DIAGNOSIS — M67912 Unspecified disorder of synovium and tendon, left shoulder: Secondary | ICD-10-CM | POA: Diagnosis not present

## 2017-07-29 ENCOUNTER — Telehealth: Payer: Self-pay

## 2017-07-29 NOTE — Telephone Encounter (Signed)
Pt came by to request a notice When her referral has been place please follow up [phone]

## 2017-07-29 NOTE — Telephone Encounter (Signed)
PT came in to request a referral to pain management  -Pt. Called requesting for her pain medicine referral to be sent to  Attn: [name] [phone]  Phone: [phone]

## 2017-07-30 ENCOUNTER — Ambulatory Visit: Payer: Commercial Managed Care - HMO

## 2017-07-30 ENCOUNTER — Other Ambulatory Visit: Payer: Self-pay | Admitting: Physician Assistant

## 2017-07-30 DIAGNOSIS — M5136 Other intervertebral disc degeneration, lumbar region: Secondary | ICD-10-CM

## 2017-07-30 DIAGNOSIS — M51369 Other intervertebral disc degeneration, lumbar region without mention of lumbar back pain or lower extremity pain: Secondary | ICD-10-CM

## 2017-07-30 MED ORDER — HYDROCODONE-ACETAMINOPHEN 10-325 MG PO TABS: 1.0000 | ORAL_TABLET | Freq: Three times a day (TID) | ORAL | 0 refills | Status: DC | PRN

## 2017-07-30 NOTE — Telephone Encounter (Signed)
Patient has not made an appointment to establish with a PCP. Patients refill has already been placed. She will need to await a phone call from their clinic with her initial appointment.

## 2017-07-30 NOTE — Telephone Encounter (Signed)
Please call and clarify what is needed.  I placed a referral for pain management at her last visit.  She still needs to be assigned a PCP. Thanks, Freeman Caldron, PA-C

## 2017-08-11 NOTE — Telephone Encounter (Signed)
Noted  Sent Referral to  [facility] in [place] per patient request

## 2017-08-25 ENCOUNTER — Ambulatory Visit: Payer: Medicare HMO | Attending: Internal Medicine | Admitting: Internal Medicine

## 2017-08-25 ENCOUNTER — Encounter: Payer: Self-pay | Admitting: Internal Medicine

## 2017-08-25 VITALS — BP 130/84 | HR 73 | Temp 98.3°F | Resp 16 | Ht 59.0 in | Wt 153.6 lb

## 2017-08-25 DIAGNOSIS — Z23 Encounter for immunization: Secondary | ICD-10-CM

## 2017-08-25 DIAGNOSIS — M5136 Other intervertebral disc degeneration, lumbar region: Secondary | ICD-10-CM | POA: Diagnosis not present

## 2017-08-25 DIAGNOSIS — Z79899 Other long term (current) drug therapy: Secondary | ICD-10-CM | POA: Insufficient documentation

## 2017-08-25 DIAGNOSIS — I10 Essential (primary) hypertension: Secondary | ICD-10-CM

## 2017-08-25 DIAGNOSIS — M542 Cervicalgia: Secondary | ICD-10-CM

## 2017-08-25 DIAGNOSIS — H918X2 Other specified hearing loss, left ear: Secondary | ICD-10-CM | POA: Insufficient documentation

## 2017-08-25 DIAGNOSIS — E781 Pure hyperglyceridemia: Secondary | ICD-10-CM | POA: Diagnosis not present

## 2017-08-25 DIAGNOSIS — E559 Vitamin D deficiency, unspecified: Secondary | ICD-10-CM | POA: Diagnosis not present

## 2017-08-25 DIAGNOSIS — E119 Type 2 diabetes mellitus without complications: Secondary | ICD-10-CM | POA: Diagnosis present

## 2017-08-25 DIAGNOSIS — G8929 Other chronic pain: Secondary | ICD-10-CM | POA: Diagnosis not present

## 2017-08-25 DIAGNOSIS — E118 Type 2 diabetes mellitus with unspecified complications: Secondary | ICD-10-CM

## 2017-08-25 DIAGNOSIS — H9192 Unspecified hearing loss, left ear: Secondary | ICD-10-CM | POA: Diagnosis not present

## 2017-08-25 DIAGNOSIS — M25512 Pain in left shoulder: Secondary | ICD-10-CM | POA: Diagnosis not present

## 2017-08-25 DIAGNOSIS — H6121 Impacted cerumen, right ear: Secondary | ICD-10-CM

## 2017-08-25 DIAGNOSIS — F32A Depression, unspecified: Secondary | ICD-10-CM

## 2017-08-25 DIAGNOSIS — F329 Major depressive disorder, single episode, unspecified: Secondary | ICD-10-CM

## 2017-08-25 LAB — GLUCOSE, POCT (MANUAL RESULT ENTRY): POC Glucose: 170 mg/dl — AB (ref 70–99)

## 2017-08-25 MED ORDER — LISINOPRIL 20 MG PO TABS: 20.0000 mg | ORAL_TABLET | Freq: Every day | ORAL | 6 refills | Status: DC

## 2017-08-25 MED ORDER — FENOFIBRATE 48 MG PO TABS: 48.0000 mg | ORAL_TABLET | Freq: Every day | ORAL | 6 refills | Status: DC

## 2017-08-25 MED ORDER — TRAZODONE HCL 50 MG PO TABS: 50.0000 mg | ORAL_TABLET | Freq: Every day | ORAL | 5 refills | Status: DC

## 2017-08-25 MED ORDER — SITAGLIPTIN PHOS-METFORMIN HCL 50-500 MG PO TABS: 1.0000 | ORAL_TABLET | Freq: Two times a day (BID) | ORAL | 6 refills | Status: DC

## 2017-08-25 MED ORDER — VENLAFAXINE HCL ER 150 MG PO CP24: ORAL_CAPSULE | ORAL | 6 refills | Status: DC

## 2017-08-25 NOTE — Progress Notes (Signed)
Patient ID: Amy Glass, female    DOB: 1961-07-22  MRN: [id number]  CC: re-establish; Diabetes; and Hypertension   Subjective: Amy Glass is a 57 y.o. female who presents to establish care with me as PCP. Her concerns today include: Patient with history of DM, HTN, depression/anxiety, chronic neck pain, elevated TG  1.  C/o chronic pain in LT shoulder, neck and upper back. -has appt with ortho Dr. Michel Santee with [facility].  May need surgery on the LT shoulder.  Last seen 2 wks ago -has appt with Pain specialist in [place] this Wednesday  2. Depression:  Request RF on Trazodone and Effexor.  Takes the Trazodone to help wirh sleep. She was getting it through [facility] but wants to know if I can fill it for her and she does not wish to go back where he has to get trazodone  3.  DM:  Out of Janumet x 1 day.  Checks BS once a day in a.m before breakfast.  Gives range of 125 or lower  4.  HTN: taking Lisinopril 20 mg once a day, not BID as indicated on med list. No CP/SOB/LE edema.  Limits sal tin foods  5.  TG elevated >1000 x 2 yrs. Did not tolerate a few of the statins which she states caused GI upset.    6.  Has problem hearing out of LT ear times a few years.  Had screening hearing test recently which confirmed abnormal hearing on the left compared to the right.  She would like to be referred for further evaluation  HM:  Due for eye exam, and flu shot.  Wants to know if she is due for bone mineral density study  Patient Active Problem List   Diagnosis Date Noted  . HTN (hypertension) 12/26/2016  . Depression 02/08/2016  . Scalp mass 02/08/2016  . Rosacea 02/08/2016  . Right shoulder pain 12/22/2015  . Cerumen impaction 12/22/2015  . Allergic sinusitis 12/22/2015  . Patient desires pregnancy 12/22/2015  . Dysphagia   . Acanthosis nigricans 09/27/2015  . TMJ pain dysfunction syndrome 01/16/2015  . Poor dentition 01/16/2015  . Lumbar degenerative disc disease  01/10/2015  . Vitamin D deficiency 12/05/2014  . High triglycerides 12/05/2014  . Diabetes type 2, controlled ([place]) 12/02/2014  . Lateral pain of right hip 12/02/2014  . Numbness and tingling in right hand 12/02/2014     Current Outpatient Medications on File Prior to Visit  Medication Sig Dispense Refill  . Blood Glucose Monitoring Suppl (TRUE METRIX METER) w/Device KIT 1 each by Does not apply route as needed. 1 kit 0  . HYDROcodone-acetaminophen (NORCO) 10-325 MG tablet Take 1 tablet by mouth every 8 (eight) hours as needed. 30 tablet 0  . ketotifen (ZADITOR) 0.025 % ophthalmic solution Place 1 drop into both eyes 2 (two) times daily. (Patient taking differently: Place 1 drop into both eyes 2 (two) times daily as needed (allergies). ) 10 mL 1  . TRUEPLUS LANCETS 28G MISC 1 each by Does not apply route 3 (three) times daily. 30 each 11  . glucose blood (TRUE METRIX BLOOD GLUCOSE TEST) test strip 1 each by Other route daily. 35 each 11  . pantoprazole (PROTONIX) 20 MG tablet Take 2 tablets (40 mg total) by mouth daily. 28 tablet 0   No current facility-administered medications on file prior to visit.     Allergies  Allergen Reactions  . Nitroglycerin Other (See Comments)    Migraines, (only tried nitro patch.  Has never tried the pills)    Social History   Socioeconomic History  . Marital status: Married    Spouse name: Not on file  . Number of children: Not on file  . Years of education: Not on file  . Highest education level: Not on file  Social Needs  . Financial resource strain: Not on file  . Food insecurity - worry: Not on file  . Food insecurity - inability: Not on file  . Transportation needs - medical: Not on file  . Transportation needs - non-medical: Not on file  Occupational History  . Occupation: Scientist, water quality  Tobacco Use  . Smoking status: Never Smoker  . Smokeless tobacco: Never Used  Substance and Sexual Activity  . Alcohol use: No    Alcohol/week: 0.0 oz  .  Drug use: No  . Sexual activity: Not on file  Other Topics Concern  . Not on file  Social History Narrative  . Not on file    Family History  Problem Relation Age of Onset  . Diabetes Mother   . Heart disease Mother   . Hyperlipidemia Mother   . Hypertension Mother   . Renal cancer Maternal Aunt   . Stomach cancer Maternal Uncle   . Colon cancer Neg Hx   . Esophageal cancer Neg Hx   . Rectal cancer Neg Hx     Past Surgical History:  Procedure Laterality Date  . BACK SURGERY  2002   lumbar  . BROW LIFT Bilateral 09/17/2016   Procedure: BLEPHAROPLASTY;  Surgeon: Irene Limbo, MD;  Location: [facility];  Service: Plastics;  Laterality: Bilateral;  . CARPAL TUNNEL RELEASE Right   . CARPAL TUNNEL RELEASE Left 06/2016  . CESAREAN SECTION  05/20/2003   . ESOPHAGEAL MANOMETRY N/A 10/25/2015   Procedure: ESOPHAGEAL MANOMETRY (EM);  Surgeon: Jerene Bears, MD;  Location: [facility];  Service: Gastroenterology;  Laterality: N/A;  . PTOSIS REPAIR Bilateral 09/17/2016   Procedure: BILATERAL PTOSIS REPAIR EYELID WITH SUTURE TECHNIQUE, BILATERAL UPPER LID BLEPHAROPLASTY WITH EXCESS SKIN WEIGHING EYELID DOWN.;  Surgeon: Irene Limbo, MD;  Location: [facility];  Service: Plastics;  Laterality: Bilateral;  . SHOULDER SURGERY  08/2013    b/l shoulder   . SHOULDER SURGERY Right 11/2015    ROS: Review of Systems Negative except as stated above  PHYSICAL EXAM: BP 130/84   Pulse 73   Temp 98.3 F (36.8 C) (Oral)   Resp 16   Ht '4\' 11"'  (1.499 m)   Wt 153 lb 9.6 oz (69.7 kg)   LMP 05/30/2015   SpO2 99%   BMI 31.02 kg/m   Physical Exam  General appearance - alert, well appearing, and in no distress Mental status - alert, oriented to person, place, and time, normal mood, behavior, speech, dress, motor activity, and thought processes Ears -soft impacted cerumen in right ear.  Left ear canal and tympanic membrane within normal limits except for a small  amount of wax at opening to the canal Mouth - mucous membranes moist, pharynx normal without lesions Neck - supple, no significant adenopathy Chest - clear to auscultation, no wheezes, rales or rhonchi, symmetric air entry Heart - normal rate, regular rhythm, normal S1, S2, no murmurs, rubs, clicks or gallops Extremities - no LE edema  Lab Results  Component Value Date   HGBA1C 7.8 06/26/2017   Lab Results  Component Value Date   WBC 6.0 04/30/2017   HGB 13.6 04/30/2017   HCT 41.5 04/30/2017  MCV 86.3 04/30/2017   PLT 235 04/30/2017     Chemistry      Component Value Date/Time   NA 135 04/30/2017 2022   K 4.3 04/30/2017 2022   CL 99 (L) 04/30/2017 2022   CO2 25 04/30/2017 2022   BUN 11 04/30/2017 2022   CREATININE 0.67 04/30/2017 2022   CREATININE 0.56 12/21/2015 1725      Component Value Date/Time   CALCIUM 9.7 04/30/2017 2022   ALKPHOS 82 04/30/2017 2022   AST 25 04/30/2017 2022   ALT 24 04/30/2017 2022   BILITOT 0.6 04/30/2017 2022     Lab Results  Component Value Date   CHOL 276 (H) 12/26/2016   HDL 26 (L) 12/26/2016   LDLCALC Comment 12/26/2016   TRIG 1,030 (HH) 12/26/2016   CHOLHDL 10.6 (H) 12/26/2016    ASSESSMENT AND PLAN: 1. Controlled type 2 diabetes mellitus with complication, without long-term current use of insulin ([place]) Reported blood sugars within goal even the last A1c done 2 months ago was above goal - POCT glucose (manual entry) - sitaGLIPtin-metformin (JANUMET) 50-500 MG tablet; Take 1 tablet by mouth 2 (two) times daily with a meal.  Dispense: 60 tablet; Refill: 6 - Ambulatory referral to Ophthalmology  2. Depression, unspecified depression type - venlafaxine XR (EFFEXOR-XR) 150 MG 24 hr capsule; TAKE 1 CAPSULE BY MOUTH DAILY WITH BREAKFAST  Dispense: 30 capsule; Refill: 6 - traZODone (DESYREL) 50 MG tablet; Take 1 tablet (50 mg total) by mouth at bedtime.  Dispense: 30 tablet; Refill: 5  3. Chronic neck pain 4. Chronic pain in left  shoulder -Has appointment coming up with Ortho and pain specialist  5. Hypertension, unspecified type - lisinopril (PRINIVIL,ZESTRIL) 20 MG tablet; Take 1 tablet (20 mg total) by mouth daily.  Dispense: 30 tablet; Refill: 6  6. Hypertriglyceridemia Patient states she has a hard time taking cholesterol medication.  She wanted to know whether there was some procedure where they can go into the arteries remove cholesterol buildup to prevent heart attack.  Patient informed that we work on risk reduction lifestyle changes and use of medications to help prevent CVD. I recommend trying Tricor if repeat fasting lipid profile reveals TG to still be elevated - fenofibrate (TRICOR) 48 MG tablet; Take 1 tablet (48 mg total) by mouth daily.  Dispense: 30 tablet; Refill: 6 - Lipid panel; Future  7. Impacted cerumen of right ear -CMA was able to irrigate this ear to remove wax  8. Decreased hearing of left ear - Ambulatory referral to ENT  9. Need for influenza vaccination - Flu Vaccine QUAD 6+ mos PF IM (Fluarix Quad PF)  Patient was given the opportunity to ask questions.  Patient verbalized understanding of the plan and was able to repeat key elements of the plan.   Orders Placed This Encounter  Procedures  . Flu Vaccine QUAD 6+ mos PF IM (Fluarix Quad PF)  . Lipid panel  . Ambulatory referral to Ophthalmology  . Ambulatory referral to ENT  . POCT glucose (manual entry)     Requested Prescriptions   Signed Prescriptions Disp Refills  . sitaGLIPtin-metformin (JANUMET) 50-500 MG tablet 60 tablet 6    Sig: Take 1 tablet by mouth 2 (two) times daily with a meal.  . venlafaxine XR (EFFEXOR-XR) 150 MG 24 hr capsule 30 capsule 6    Sig: TAKE 1 CAPSULE BY MOUTH DAILY WITH BREAKFAST  . fenofibrate (TRICOR) 48 MG tablet 30 tablet 6    Sig: Take 1 tablet (  48 mg total) by mouth daily.  . traZODone (DESYREL) 50 MG tablet 30 tablet 5    Sig: Take 1 tablet (50 mg total) by mouth at bedtime.  Marland Kitchen  lisinopril (PRINIVIL,ZESTRIL) 20 MG tablet 30 tablet 6    Sig: Take 1 tablet (20 mg total) by mouth daily.    Return in about 4 months (around 12/23/2017).  Karle Plumber, MD, FACP

## 2017-08-25 NOTE — Patient Instructions (Signed)

## 2017-08-26 DIAGNOSIS — M25512 Pain in left shoulder: Secondary | ICD-10-CM | POA: Diagnosis not present

## 2017-08-26 DIAGNOSIS — M67912 Unspecified disorder of synovium and tendon, left shoulder: Secondary | ICD-10-CM | POA: Diagnosis not present

## 2017-08-27 DIAGNOSIS — Z79891 Long term (current) use of opiate analgesic: Secondary | ICD-10-CM | POA: Diagnosis not present

## 2017-08-27 DIAGNOSIS — M791 Myalgia, unspecified site: Secondary | ICD-10-CM | POA: Diagnosis not present

## 2017-08-27 DIAGNOSIS — M961 Postlaminectomy syndrome, not elsewhere classified: Secondary | ICD-10-CM | POA: Diagnosis not present

## 2017-08-27 DIAGNOSIS — M47816 Spondylosis without myelopathy or radiculopathy, lumbar region: Secondary | ICD-10-CM | POA: Diagnosis not present

## 2017-08-27 DIAGNOSIS — M542 Cervicalgia: Secondary | ICD-10-CM | POA: Diagnosis not present

## 2017-08-27 DIAGNOSIS — M25551 Pain in right hip: Secondary | ICD-10-CM | POA: Diagnosis not present

## 2017-08-28 DIAGNOSIS — E669 Obesity, unspecified: Secondary | ICD-10-CM | POA: Diagnosis not present

## 2017-08-28 DIAGNOSIS — E559 Vitamin D deficiency, unspecified: Secondary | ICD-10-CM | POA: Diagnosis not present

## 2017-08-28 DIAGNOSIS — E1165 Type 2 diabetes mellitus with hyperglycemia: Secondary | ICD-10-CM | POA: Diagnosis not present

## 2017-08-28 DIAGNOSIS — K635 Polyp of colon: Secondary | ICD-10-CM | POA: Diagnosis not present

## 2017-08-28 DIAGNOSIS — F418 Other specified anxiety disorders: Secondary | ICD-10-CM | POA: Diagnosis not present

## 2017-08-28 DIAGNOSIS — I10 Essential (primary) hypertension: Secondary | ICD-10-CM | POA: Diagnosis not present

## 2017-08-28 DIAGNOSIS — Z0000 Encounter for general adult medical examination without abnormal findings: Secondary | ICD-10-CM | POA: Diagnosis not present

## 2017-08-28 DIAGNOSIS — E785 Hyperlipidemia, unspecified: Secondary | ICD-10-CM | POA: Diagnosis not present

## 2017-08-28 DIAGNOSIS — J302 Other seasonal allergic rhinitis: Secondary | ICD-10-CM | POA: Diagnosis not present

## 2017-08-31 ENCOUNTER — Emergency Department (HOSPITAL_COMMUNITY)
Admission: EM | Admit: 2017-08-31 | Discharge: 2017-08-31 | Disposition: A | Discharge status: Home / Self Care | Payer: Medicare HMO | Attending: Emergency Medicine | Admitting: Emergency Medicine

## 2017-08-31 ENCOUNTER — Emergency Department (HOSPITAL_COMMUNITY): Payer: Medicare HMO

## 2017-08-31 ENCOUNTER — Other Ambulatory Visit: Payer: Self-pay

## 2017-08-31 ENCOUNTER — Encounter (HOSPITAL_COMMUNITY): Payer: Self-pay | Admitting: Emergency Medicine

## 2017-08-31 DIAGNOSIS — I10 Essential (primary) hypertension: Secondary | ICD-10-CM | POA: Diagnosis not present

## 2017-08-31 DIAGNOSIS — Z7984 Long term (current) use of oral hypoglycemic drugs: Secondary | ICD-10-CM | POA: Insufficient documentation

## 2017-08-31 DIAGNOSIS — R05 Cough: Secondary | ICD-10-CM | POA: Diagnosis not present

## 2017-08-31 DIAGNOSIS — E119 Type 2 diabetes mellitus without complications: Secondary | ICD-10-CM | POA: Insufficient documentation

## 2017-08-31 DIAGNOSIS — J111 Influenza due to unidentified influenza virus with other respiratory manifestations: Secondary | ICD-10-CM | POA: Insufficient documentation

## 2017-08-31 DIAGNOSIS — R69 Illness, unspecified: Secondary | ICD-10-CM

## 2017-08-31 DIAGNOSIS — Z79899 Other long term (current) drug therapy: Secondary | ICD-10-CM | POA: Diagnosis not present

## 2017-08-31 DIAGNOSIS — J029 Acute pharyngitis, unspecified: Secondary | ICD-10-CM | POA: Diagnosis present

## 2017-08-31 DIAGNOSIS — R509 Fever, unspecified: Secondary | ICD-10-CM | POA: Diagnosis not present

## 2017-08-31 LAB — RAPID STREP SCREEN (MED CTR MEBANE ONLY): Streptococcus, Group A Screen (Direct): NEGATIVE

## 2017-08-31 MED ORDER — NAPROXEN 500 MG PO TABS: 500.0000 mg | ORAL_TABLET | Freq: Two times a day (BID) | ORAL | 0 refills | Status: DC

## 2017-08-31 MED ORDER — KETOROLAC TROMETHAMINE 30 MG/ML IJ SOLN
30.0000 mg | Freq: Once | INTRAMUSCULAR | Status: AC
  Administered 2017-08-31: 30 mg via INTRAMUSCULAR
  Filled 2017-08-31: qty 1

## 2017-08-31 MED ORDER — BENZONATATE 100 MG PO CAPS: 100.0000 mg | ORAL_CAPSULE | Freq: Three times a day (TID) | ORAL | 0 refills | Status: DC

## 2017-08-31 MED ORDER — ONDANSETRON 4 MG PO TBDP: 4.0000 mg | ORAL_TABLET | Freq: Three times a day (TID) | ORAL | 0 refills | Status: DC | PRN

## 2017-08-31 MED ORDER — ACETAMINOPHEN 325 MG PO TABS
650.0000 mg | ORAL_TABLET | Freq: Once | ORAL | Status: AC | PRN
  Administered 2017-08-31: 650 mg via ORAL
  Filled 2017-08-31: qty 2

## 2017-08-31 MED ORDER — FLUTICASONE PROPIONATE 50 MCG/ACT NA SUSP: 1.0000 | Freq: Every day | NASAL | 2 refills | Status: DC

## 2017-08-31 NOTE — Discharge Instructions (Signed)
Please read attached information regarding your condition. Tessalon Perles as needed for cough.  Flonase as needed for nasal congestion.  Zofran as needed for nausea.  Naproxen as needed for body aches. Continue Tylenol as needed for fevers. Push fluids to maintain adequate hydration and slowly advance her diet as tolerated. Return to ED for worsening symptoms, trouble breathing, trouble swallowing, fevers that are not improving, injuries, lightheadedness or loss of consciousness.

## 2017-08-31 NOTE — ED Notes (Signed)
Patient returned from XRay

## 2017-08-31 NOTE — ED Provider Notes (Signed)
[facility] EMERGENCY DEPARTMENT Provider Note   CSN: [id number] Arrival date & time: 08/31/17  3151     History   Chief Complaint Chief Complaint  Patient presents with  . Generalized Body Aches  . Sore Throat  . Fever    HPI Amy Glass is a 57 y.o. female with a past medical history of diabetes, hypertension, who presents to ED for evaluation of 3-day history of influenza-like symptoms including generalized body aches, rhinorrhea, sore throat, nausea, subjective fever.  She has tried Advil with mild improvement in her symptoms.  She was around her mother who has similar symptoms a few days ago.  She did receive her influenza vaccine this year.  She denies any vomiting, chest pain, shortness of breath, hemoptysis, leg swelling, trouble breathing, trouble swallowing, neck pain, diarrhea, recent surgeries, history of lung or cardiac disease.  HPI  Past Medical History:  Diagnosis Date  . Allergy   . Anxiety    as child   . Depression    as child  . Diabetes mellitus without complication ([place]) Dx 7616  . Diverticulosis   . Hiatal hernia   . Hyperplastic colon polyp   . Hypertension Dx 2003  . Schatzki's ring     Patient Active Problem List   Diagnosis Date Noted  . HTN (hypertension) 12/26/2016  . Depression 02/08/2016  . Scalp mass 02/08/2016  . Rosacea 02/08/2016  . Right shoulder pain 12/22/2015  . Cerumen impaction 12/22/2015  . Allergic sinusitis 12/22/2015  . Patient desires pregnancy 12/22/2015  . Dysphagia   . Acanthosis nigricans 09/27/2015  . TMJ pain dysfunction syndrome 01/16/2015  . Poor dentition 01/16/2015  . Lumbar degenerative disc disease 01/10/2015  . Vitamin D deficiency 12/05/2014  . High triglycerides 12/05/2014  . Diabetes type 2, controlled ([place]) 12/02/2014  . Lateral pain of right hip 12/02/2014  . Numbness and tingling in right hand 12/02/2014    Past Surgical History:  Procedure Laterality Date  . BACK  SURGERY  2002   lumbar  . BROW LIFT Bilateral 09/17/2016   Procedure: BLEPHAROPLASTY;  Surgeon: Irene Limbo, MD;  Location: [facility];  Service: Plastics;  Laterality: Bilateral;  . CARPAL TUNNEL RELEASE Right   . CARPAL TUNNEL RELEASE Left 06/2016  . CESAREAN SECTION  05/20/2003   . ESOPHAGEAL MANOMETRY N/A 10/25/2015   Procedure: ESOPHAGEAL MANOMETRY (EM);  Surgeon: Jerene Bears, MD;  Location: [facility];  Service: Gastroenterology;  Laterality: N/A;  . PTOSIS REPAIR Bilateral 09/17/2016   Procedure: BILATERAL PTOSIS REPAIR EYELID WITH SUTURE TECHNIQUE, BILATERAL UPPER LID BLEPHAROPLASTY WITH EXCESS SKIN WEIGHING EYELID DOWN.;  Surgeon: Irene Limbo, MD;  Location: [facility];  Service: Plastics;  Laterality: Bilateral;  . SHOULDER SURGERY  08/2013    b/l shoulder   . SHOULDER SURGERY Right 11/2015    OB History    No data available       Home Medications    Prior to Admission medications   Medication Sig Start Date End Date Taking? Authorizing Provider  benzonatate (TESSALON) 100 MG capsule Take 1 capsule (100 mg total) by mouth every 8 (eight) hours. 08/31/17   Kruze Atchley, PA-C  Blood Glucose Monitoring Suppl (TRUE METRIX METER) w/Device KIT 1 each by Does not apply route as needed. 12/26/16   Funches, Adriana Mccallum, MD  fenofibrate (TRICOR) 48 MG tablet Take 1 tablet (48 mg total) by mouth daily. 08/25/17   [name], MD  fluticasone (FLONASE) 50 MCG/ACT  nasal spray Place 1 spray into both nostrils daily. 08/31/17   Carmalita Wakefield, PA-C  glucose blood (TRUE METRIX BLOOD GLUCOSE TEST) test strip 1 each by Other route daily. 12/26/16   Funches, Adriana Mccallum, MD  HYDROcodone-acetaminophen (NORCO) 10-325 MG tablet Take 1 tablet by mouth every 8 (eight) hours as needed. 07/30/17   Argentina Donovan, PA-C  ketotifen (ZADITOR) 0.025 % ophthalmic solution Place 1 drop into both eyes 2 (two) times daily. Patient taking differently: Place 1 drop into both eyes 2  (two) times daily as needed (allergies).  12/26/16   Funches, Adriana Mccallum, MD  lisinopril (PRINIVIL,ZESTRIL) 20 MG tablet Take 1 tablet (20 mg total) by mouth daily. 08/25/17   [name], MD  naproxen (NAPROSYN) 500 MG tablet Take 1 tablet (500 mg total) by mouth 2 (two) times daily. 08/31/17   Janson Lamar, PA-C  ondansetron (ZOFRAN ODT) 4 MG disintegrating tablet Take 1 tablet (4 mg total) by mouth every 8 (eight) hours as needed for nausea or vomiting. 08/31/17   Celsey Asselin, PA-C  pantoprazole (PROTONIX) 20 MG tablet Take 2 tablets (40 mg total) by mouth daily. 05/01/17 05/15/17  Gareth Morgan, MD  sitaGLIPtin-metformin (JANUMET) 50-500 MG tablet Take 1 tablet by mouth 2 (two) times daily with a meal. 08/25/17   [name], MD  traZODone (DESYREL) 50 MG tablet Take 1 tablet (50 mg total) by mouth at bedtime. 08/25/17   [name], MD  TRUEPLUS LANCETS 28G MISC 1 each by Does not apply route 3 (three) times daily. 12/26/16   Boykin Nearing, MD  venlafaxine XR (EFFEXOR-XR) 150 MG 24 hr capsule TAKE 1 CAPSULE BY MOUTH DAILY WITH BREAKFAST 08/25/17   [name], MD    Family History Family History  Problem Relation Age of Onset  . Diabetes Mother   . Heart disease Mother   . Hyperlipidemia Mother   . Hypertension Mother   . Renal cancer Maternal Aunt   . Stomach cancer Maternal Uncle   . Colon cancer Neg Hx   . Esophageal cancer Neg Hx   . Rectal cancer Neg Hx     Social History Social History   Tobacco Use  . Smoking status: Never Smoker  . Smokeless tobacco: Never Used  Substance Use Topics  . Alcohol use: No    Alcohol/week: 0.0 oz  . Drug use: No     Allergies   Nitroglycerin   Review of Systems Review of Systems  Constitutional: Positive for fatigue and fever. Negative for appetite change and chills.  HENT: Positive for congestion, postnasal drip, rhinorrhea and sore throat. Negative for dental problem, drooling, ear pain, facial swelling, mouth  sores, sinus pressure, sinus pain and sneezing.   Eyes: Negative for photophobia and visual disturbance.  Respiratory: Positive for cough. Negative for chest tightness, shortness of breath and wheezing.   Cardiovascular: Negative for chest pain and palpitations.  Gastrointestinal: Positive for nausea. Negative for abdominal pain, blood in stool, constipation, diarrhea and vomiting.  Genitourinary: Negative for dysuria, hematuria and urgency.  Musculoskeletal: Positive for myalgias.  Skin: Negative for rash.  Neurological: Negative for dizziness, weakness and light-headedness.     Physical Exam Updated Vital Signs BP (!) 149/89   Pulse (!) 102   Temp (!) 102.5 F (39.2 C) (Oral)   Resp 18   Ht '4\' 11"'  (1.499 m)   Wt 69.4 kg (153 lb)   LMP 05/30/2015   SpO2 94%   BMI 30.90 kg/m   Physical Exam  Constitutional: She appears well-developed and well-nourished. No distress.  HENT:  Head: Normocephalic and atraumatic.  Right Ear: A middle ear effusion is present.  Left Ear: A middle ear effusion is present.  Nose: Rhinorrhea present.  Mouth/Throat: Uvula is midline. No trismus in the jaw. No dental abscesses or uvula swelling. Posterior oropharyngeal erythema present. No posterior oropharyngeal edema or tonsillar abscesses. No tonsillar exudate.  Patient does not appear to be in acute distress. No trismus or drooling present. No pooling of secretions. Patient is tolerating secretions and is not in respiratory distress. No neck pain or tenderness to palpation of the neck. Full active and passive range of motion of the neck. No evidence of RPA or PTA.  Eyes: Conjunctivae and EOM are normal. Left eye exhibits no discharge. No scleral icterus.  Neck: Normal range of motion. Neck supple.  Cardiovascular: Normal rate, regular rhythm, normal heart sounds and intact distal pulses. Exam reveals no gallop and no friction rub.  No murmur heard. Pulmonary/Chest: Effort normal and breath sounds  normal. No respiratory distress.  Abdominal: Soft. Bowel sounds are normal. She exhibits no distension. There is no tenderness. There is no guarding.  Musculoskeletal: Normal range of motion. She exhibits no edema.  Neurological: She is alert. She exhibits normal muscle tone. Coordination normal.  Skin: Skin is warm and dry. No rash noted.  Psychiatric: She has a normal mood and affect.  Nursing note and vitals reviewed.    ED Treatments / Results  Labs (all labs ordered are listed, but only abnormal results are displayed) Labs Reviewed  RAPID STREP SCREEN (NOT AT [facility])  CULTURE, GROUP A STREP [facility])    EKG  EKG Interpretation None       Radiology Dg Chest 2 View  Result Date: 08/31/2017 CLINICAL DATA:  Cough, fever EXAM: CHEST  2 VIEW COMPARISON:  06/06/2016 FINDINGS: Heart and mediastinal contours are within normal limits. No focal opacities or effusions. No acute bony abnormality. IMPRESSION: No active cardiopulmonary disease. Electronically Signed   By: Rolm Baptise M.D.   On: 08/31/2017 10:35    Procedures Procedures (including critical care time)  Medications Ordered in ED Medications  ketorolac (TORADOL) 30 MG/ML injection 30 mg (not administered)  acetaminophen (TYLENOL) tablet 650 mg (650 mg Oral Given 08/31/17 0932)     Initial Impression / Assessment and Plan / ED Course  I have reviewed the triage vital signs and the nursing notes.  Pertinent labs & imaging results that were available during my care of the patient were reviewed by me and considered in my medical decision making (see chart for details).  Clinical Course as of Aug 31 1126  Sun Aug 31, 2017  1010 Temp: (!) 102.5 F (39.2 C) [HK]  1010 Pulse Rate: (!) 101 [HK]  1125 Temp 98.19F, Pulse 98 bpm on my recheck after antipyretics administered. CXR negative, strep negative.  [HK]    Clinical Course User Index [HK] Delia Heady, PA-C    Patient presents to ED for evaluation of 3-day history of  influenza-like symptoms including generalized body aches, rhinorrhea, sore throat, nausea, subjective fever.  Sick contacts with similar symptoms at home.  She did receive her influenza vaccine this year.  She is febrile to 102.5 here in the ED with last use of antipyretics approximately 12 hours ago.  She is mildly tachycardic at 101 bpm.  Strep test was negative and there is no evidence of RPA or PTA on examination of tonsils and oropharynx she is tolerating secretions  with no trismus, drooling or signs of respiratory distress.  Lungs are clear to auscultation bilaterally, however due to patient's fever and productive cough, chest x-ray was obtained.  This returned as negative for acute abnormality.  Patient given Tylenol here, Toradol for body aches with improvement in fever and improvement in heart rate with defervescence.  I suspect influenza as a cause of her symptoms however, she is out of the window for Tamiflu administration, so will instead treat symptomatically with antitussives, anti-inflammatories, nasal spray, antiemetics.  Patient appears stable for discharge at this time.  Strict return precautions given.  Portions of this note were generated with [employer]. Dictation errors may occur despite best attempts at proofreading.   Final Clinical Impressions(s) / ED Diagnoses   Final diagnoses:  Influenza-like illness    ED Discharge Orders        Ordered    fluticasone (FLONASE) 50 MCG/ACT nasal spray  Daily     08/31/17 1126    benzonatate (TESSALON) 100 MG capsule  Every 8 hours     08/31/17 1126    naproxen (NAPROSYN) 500 MG tablet  2 times daily     08/31/17 1126    ondansetron (ZOFRAN ODT) 4 MG disintegrating tablet  Every 8 hours PRN     08/31/17 1126       Delia Heady, PA-C 08/31/17 1134    Long, [facility], MD 09/01/17 [phone]

## 2017-08-31 NOTE — ED Notes (Signed)
Placed mask on pt due to symptoms.

## 2017-08-31 NOTE — ED Triage Notes (Signed)
Pt. Stated, for 2 days Ive had body aches and sore throat for 2 days.

## 2017-09-02 DIAGNOSIS — F418 Other specified anxiety disorders: Secondary | ICD-10-CM | POA: Diagnosis not present

## 2017-09-02 DIAGNOSIS — E669 Obesity, unspecified: Secondary | ICD-10-CM | POA: Diagnosis not present

## 2017-09-02 DIAGNOSIS — K635 Polyp of colon: Secondary | ICD-10-CM | POA: Diagnosis not present

## 2017-09-02 DIAGNOSIS — I10 Essential (primary) hypertension: Secondary | ICD-10-CM | POA: Diagnosis not present

## 2017-09-02 DIAGNOSIS — J302 Other seasonal allergic rhinitis: Secondary | ICD-10-CM | POA: Diagnosis not present

## 2017-09-02 DIAGNOSIS — E559 Vitamin D deficiency, unspecified: Secondary | ICD-10-CM | POA: Diagnosis not present

## 2017-09-02 DIAGNOSIS — E785 Hyperlipidemia, unspecified: Secondary | ICD-10-CM | POA: Diagnosis not present

## 2017-09-02 DIAGNOSIS — J209 Acute bronchitis, unspecified: Secondary | ICD-10-CM | POA: Diagnosis not present

## 2017-09-02 DIAGNOSIS — E1165 Type 2 diabetes mellitus with hyperglycemia: Secondary | ICD-10-CM | POA: Diagnosis not present

## 2017-09-02 LAB — CULTURE, GROUP A STREP (THRC)

## 2017-09-04 DIAGNOSIS — M79645 Pain in left finger(s): Secondary | ICD-10-CM | POA: Diagnosis not present

## 2017-09-04 DIAGNOSIS — M25512 Pain in left shoulder: Secondary | ICD-10-CM | POA: Diagnosis not present

## 2017-09-30 DIAGNOSIS — E559 Vitamin D deficiency, unspecified: Secondary | ICD-10-CM | POA: Diagnosis not present

## 2017-09-30 DIAGNOSIS — I10 Essential (primary) hypertension: Secondary | ICD-10-CM | POA: Diagnosis not present

## 2017-09-30 DIAGNOSIS — E669 Obesity, unspecified: Secondary | ICD-10-CM | POA: Diagnosis not present

## 2017-09-30 DIAGNOSIS — K635 Polyp of colon: Secondary | ICD-10-CM | POA: Diagnosis not present

## 2017-09-30 DIAGNOSIS — E785 Hyperlipidemia, unspecified: Secondary | ICD-10-CM | POA: Diagnosis not present

## 2017-09-30 DIAGNOSIS — F418 Other specified anxiety disorders: Secondary | ICD-10-CM | POA: Diagnosis not present

## 2017-09-30 DIAGNOSIS — E1165 Type 2 diabetes mellitus with hyperglycemia: Secondary | ICD-10-CM | POA: Diagnosis not present

## 2017-09-30 DIAGNOSIS — J302 Other seasonal allergic rhinitis: Secondary | ICD-10-CM | POA: Diagnosis not present

## 2017-11-17 DIAGNOSIS — M545 Low back pain: Secondary | ICD-10-CM | POA: Diagnosis not present

## 2017-11-17 DIAGNOSIS — M25519 Pain in unspecified shoulder: Secondary | ICD-10-CM | POA: Diagnosis not present

## 2017-11-17 DIAGNOSIS — G894 Chronic pain syndrome: Secondary | ICD-10-CM | POA: Diagnosis not present

## 2017-11-17 DIAGNOSIS — Z79891 Long term (current) use of opiate analgesic: Secondary | ICD-10-CM | POA: Diagnosis not present

## 2017-11-17 DIAGNOSIS — M5416 Radiculopathy, lumbar region: Secondary | ICD-10-CM | POA: Diagnosis not present

## 2017-12-12 DIAGNOSIS — Z79891 Long term (current) use of opiate analgesic: Secondary | ICD-10-CM | POA: Diagnosis not present

## 2017-12-12 DIAGNOSIS — M5416 Radiculopathy, lumbar region: Secondary | ICD-10-CM | POA: Diagnosis not present

## 2017-12-12 DIAGNOSIS — M545 Low back pain: Secondary | ICD-10-CM | POA: Diagnosis not present

## 2017-12-12 DIAGNOSIS — M25519 Pain in unspecified shoulder: Secondary | ICD-10-CM | POA: Diagnosis not present

## 2017-12-12 DIAGNOSIS — G894 Chronic pain syndrome: Secondary | ICD-10-CM | POA: Diagnosis not present

## 2017-12-12 DIAGNOSIS — G4700 Insomnia, unspecified: Secondary | ICD-10-CM | POA: Diagnosis not present

## 2017-12-17 ENCOUNTER — Other Ambulatory Visit: Payer: Self-pay | Admitting: Physician Assistant

## 2017-12-17 DIAGNOSIS — Z139 Encounter for screening, unspecified: Secondary | ICD-10-CM

## 2017-12-23 DIAGNOSIS — F3132 Bipolar disorder, current episode depressed, moderate: Secondary | ICD-10-CM | POA: Diagnosis not present

## 2017-12-25 DIAGNOSIS — F411 Generalized anxiety disorder: Secondary | ICD-10-CM | POA: Diagnosis not present

## 2017-12-25 DIAGNOSIS — F3132 Bipolar disorder, current episode depressed, moderate: Secondary | ICD-10-CM | POA: Diagnosis not present

## 2017-12-25 DIAGNOSIS — F4312 Post-traumatic stress disorder, chronic: Secondary | ICD-10-CM | POA: Diagnosis not present

## 2017-12-25 DIAGNOSIS — G4700 Insomnia, unspecified: Secondary | ICD-10-CM | POA: Diagnosis not present

## 2017-12-26 ENCOUNTER — Ambulatory Visit: Payer: Medicare HMO

## 2017-12-31 DIAGNOSIS — F3132 Bipolar disorder, current episode depressed, moderate: Secondary | ICD-10-CM | POA: Diagnosis not present

## 2018-01-06 ENCOUNTER — Other Ambulatory Visit: Payer: Self-pay

## 2018-01-06 DIAGNOSIS — M542 Cervicalgia: Secondary | ICD-10-CM | POA: Diagnosis not present

## 2018-01-06 DIAGNOSIS — F329 Major depressive disorder, single episode, unspecified: Secondary | ICD-10-CM

## 2018-01-06 DIAGNOSIS — F32A Depression, unspecified: Secondary | ICD-10-CM

## 2018-01-06 DIAGNOSIS — M654 Radial styloid tenosynovitis [de Quervain]: Secondary | ICD-10-CM | POA: Diagnosis not present

## 2018-01-06 MED ORDER — VENLAFAXINE HCL ER 150 MG PO CP24: ORAL_CAPSULE | ORAL | 0 refills | Status: DC

## 2018-01-07 DIAGNOSIS — F3132 Bipolar disorder, current episode depressed, moderate: Secondary | ICD-10-CM | POA: Diagnosis not present

## 2018-01-07 DIAGNOSIS — F4312 Post-traumatic stress disorder, chronic: Secondary | ICD-10-CM | POA: Diagnosis not present

## 2018-01-07 DIAGNOSIS — F411 Generalized anxiety disorder: Secondary | ICD-10-CM | POA: Diagnosis not present

## 2018-01-07 DIAGNOSIS — F5101 Primary insomnia: Secondary | ICD-10-CM | POA: Diagnosis not present

## 2018-01-14 DIAGNOSIS — F3132 Bipolar disorder, current episode depressed, moderate: Secondary | ICD-10-CM | POA: Diagnosis not present

## 2018-02-03 DIAGNOSIS — F411 Generalized anxiety disorder: Secondary | ICD-10-CM | POA: Diagnosis not present

## 2018-02-03 DIAGNOSIS — F410 Panic disorder [episodic paroxysmal anxiety] without agoraphobia: Secondary | ICD-10-CM | POA: Diagnosis not present

## 2018-02-03 DIAGNOSIS — F5101 Primary insomnia: Secondary | ICD-10-CM | POA: Diagnosis not present

## 2018-02-03 DIAGNOSIS — F3132 Bipolar disorder, current episode depressed, moderate: Secondary | ICD-10-CM | POA: Diagnosis not present

## 2018-02-03 IMAGING — RF DG ESOPHAGUS
6 series · 12 of 12 positions shown · non-contrast
Comparison: None.

CLINICAL DATA: Continued dysphagia following esophageal dilatation.

EXAM:
ESOPHOGRAM / BARIUM SWALLOW / BARIUM TABLET STUDY
TECHNIQUE: Combined double contrast and single contrast examination performed
using effervescent crystals, thick barium liquid, and thin barium
liquid. The patient was observed with fluoroscopy swallowing a 13 mm
barium sulphate tablet.
FLUOROSCOPY TIME:  Radiation Exposure Index (as provided by the
fluoroscopic device): 29 mGy

[Series 1: cp_standard · 0.36mm/px · 4 of 63 frames shown (1 of 3)]
[frame 10/63]
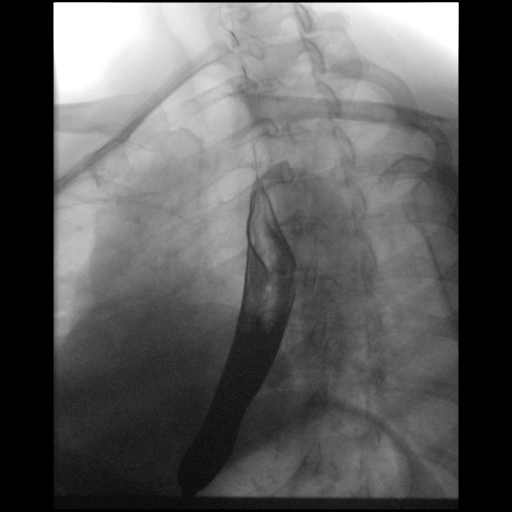
[frame 22/63]
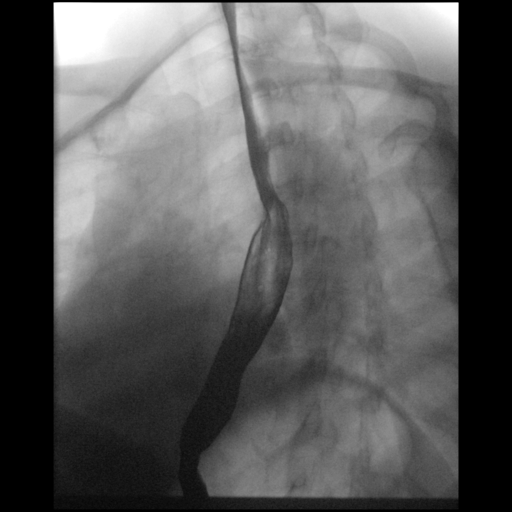
[frame 32/63]
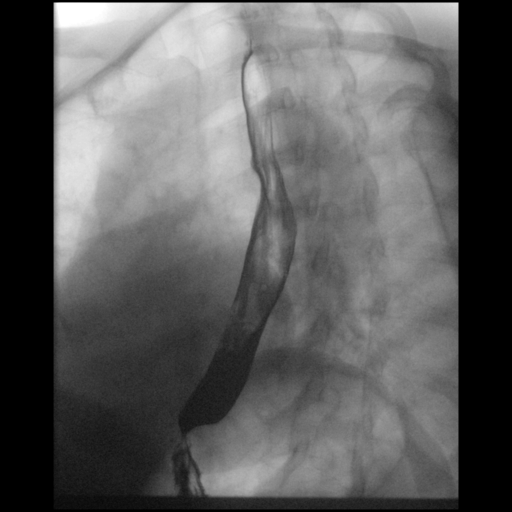
[frame 54/63]
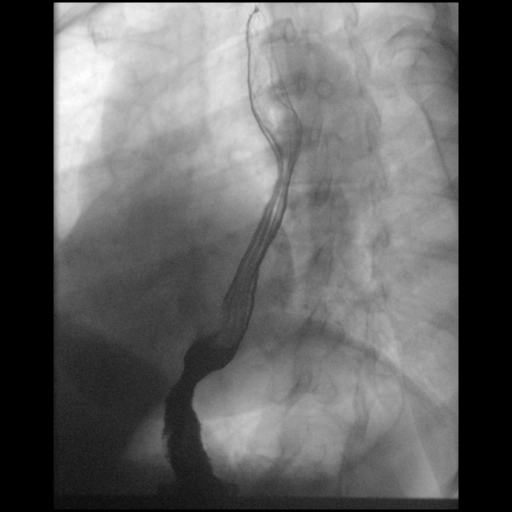

[Series 2: fluoro_barium 2fps_bw · 0.18mm/px · 2 of 2 frames shown (1 of 3)]
[frame 1/2]
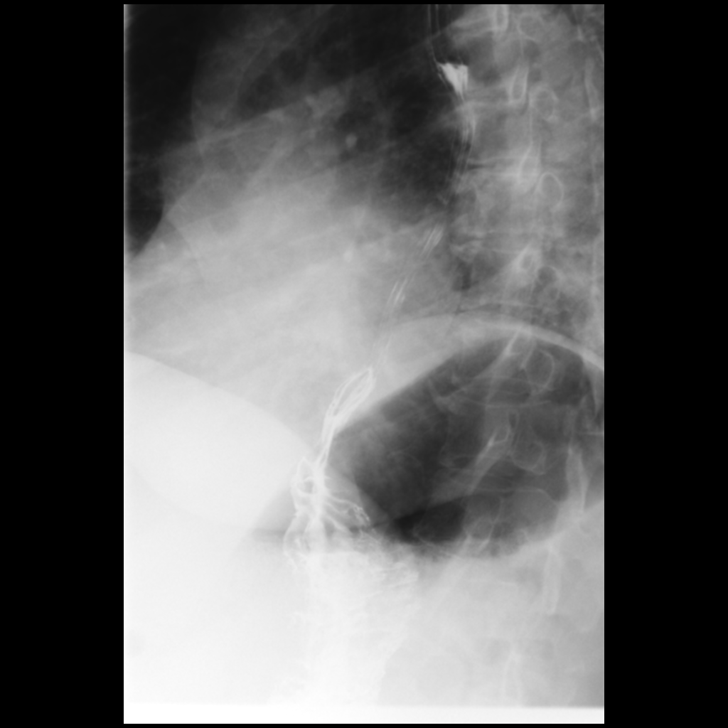
[frame 2/2]
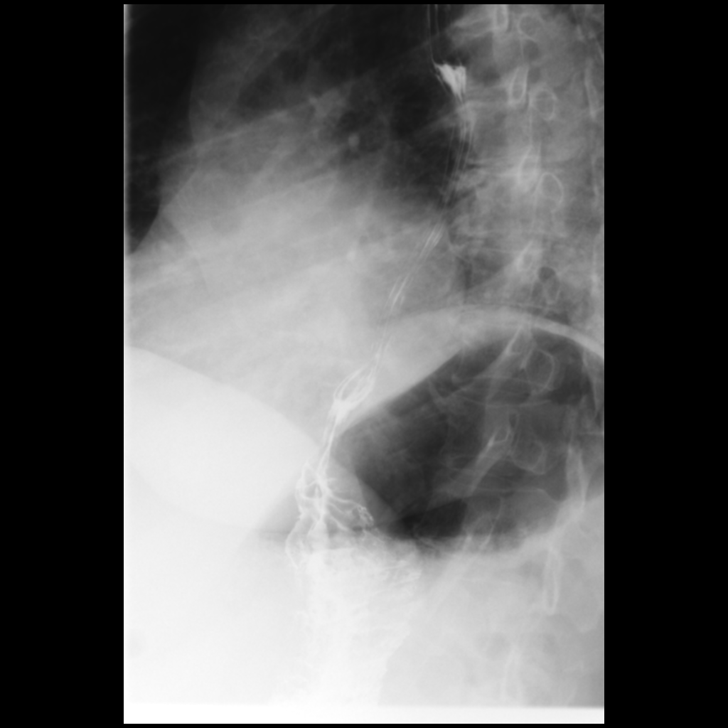

[Series 3: fluoro_barium 2fps_bw · 0.18mm/px · 2 of 2 frames shown (2 of 3)]
[frame 1/2]
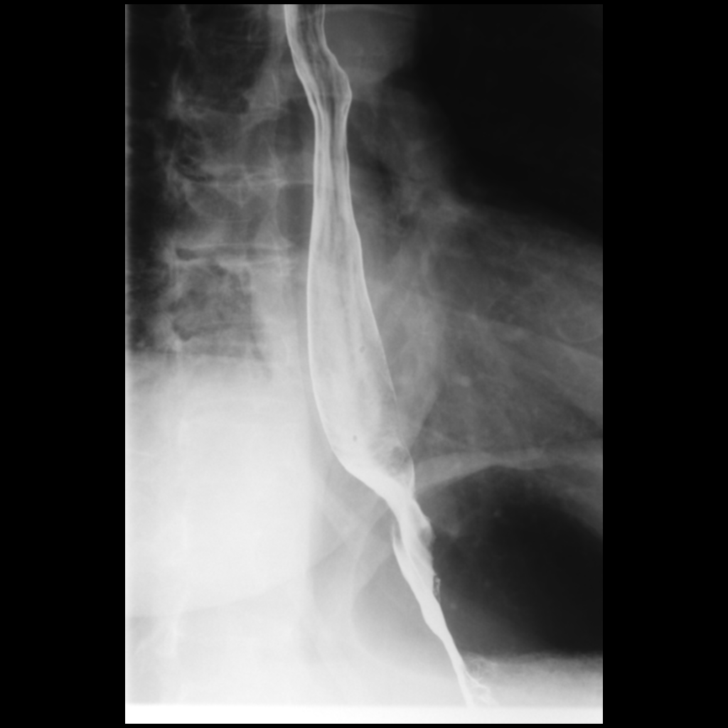
[frame 2/2]
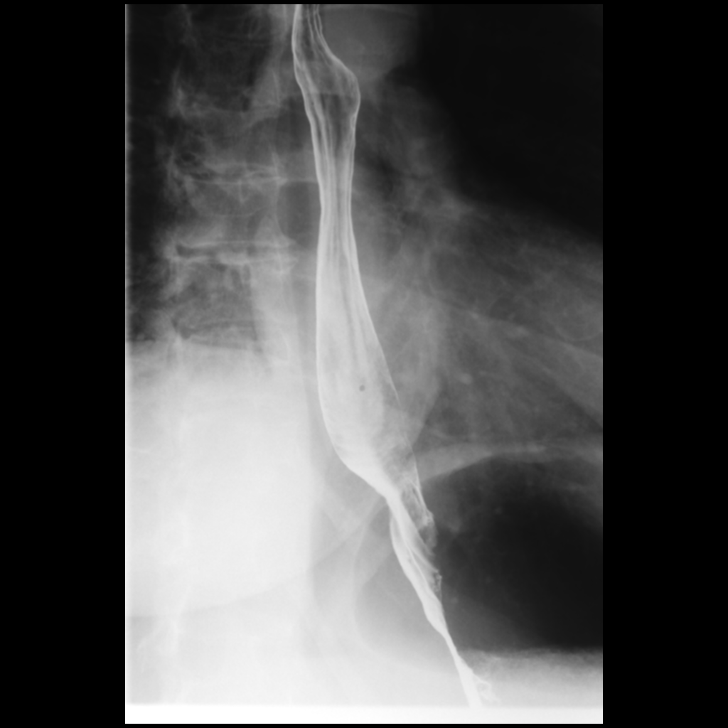

[Series 4: cp_standard · 0.19mm/px · 1 of 1 slices shown (2 of 3)]
[im 1/1]
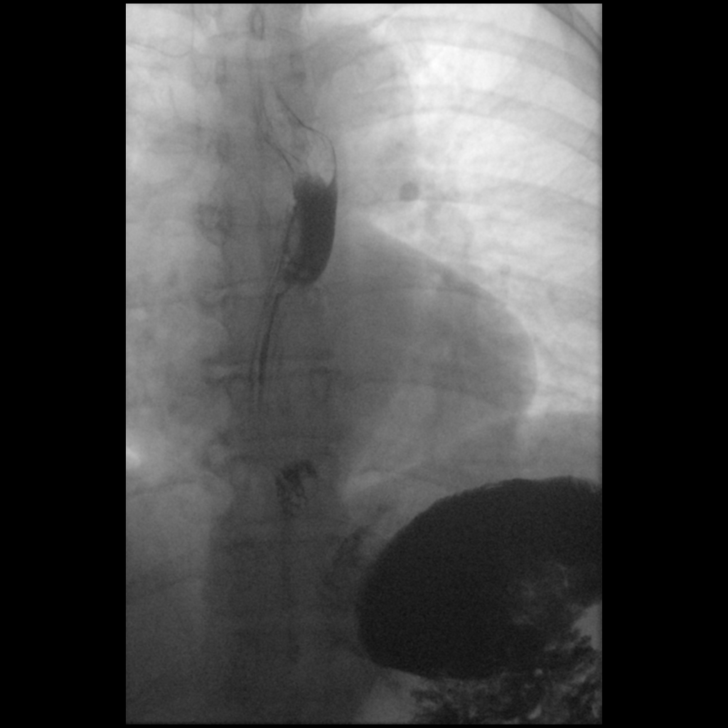

[Series 5: fluoro_barium 2fps_bw · 0.18mm/px · 2 of 2 frames shown (3 of 3)]
[frame 1/2]
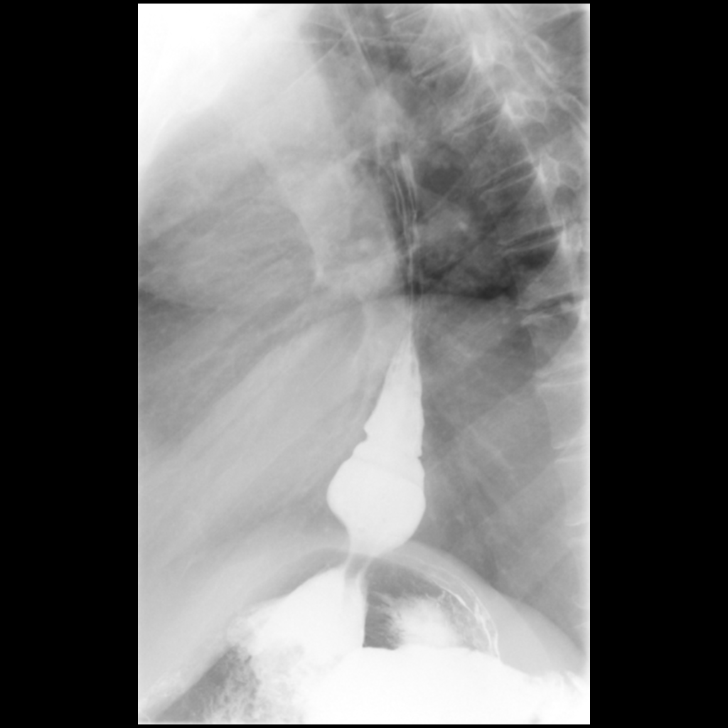
[frame 2/2]
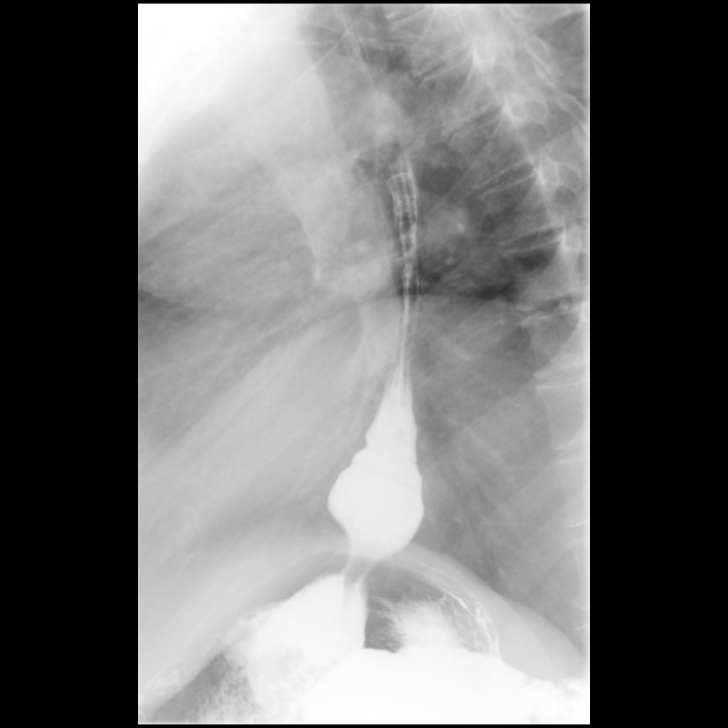

[Series 6: cp_standard · 0.20mm/px · 1 of 1 slices shown (3 of 3)]
[im 1/1]
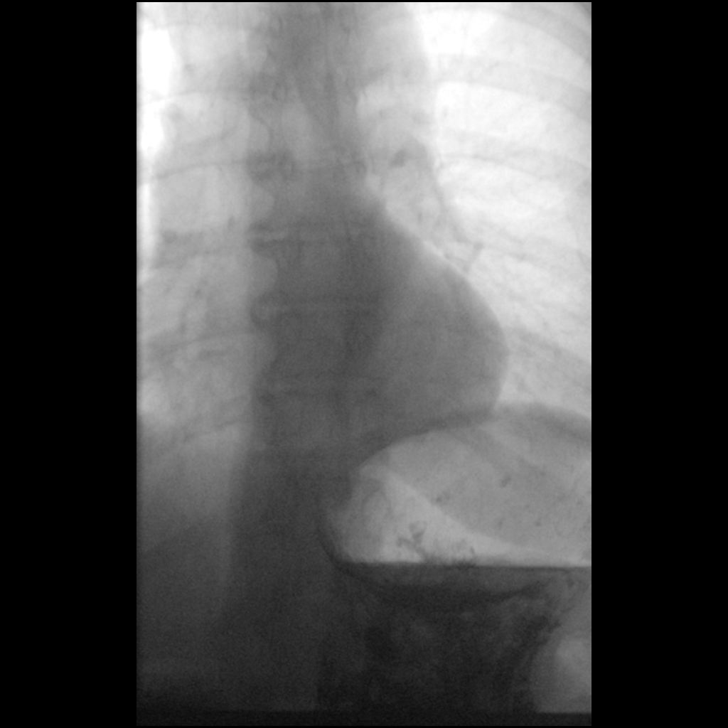

[12 of 12 positions shown; findings below may reference images not displayed]

FINDINGS: Normal swallowing function in the cervical esophagus hypopharynx. No
mucosal abnormality of the thoracic esophagus distal esophagus. GE
junction is normal. Normal esophageal motility demonstrated. Trace
gastroesophageal reflux noted. No hiatal hernia. 13 mm barium tab
passed GE junction easily
IMPRESSION: 1. No mucosal irregularity, stricture or mass.
2. Barium tablet passed the GE junction easily.

## 2018-02-04 DIAGNOSIS — M5416 Radiculopathy, lumbar region: Secondary | ICD-10-CM | POA: Diagnosis not present

## 2018-02-04 DIAGNOSIS — Z79891 Long term (current) use of opiate analgesic: Secondary | ICD-10-CM | POA: Diagnosis not present

## 2018-02-04 DIAGNOSIS — G4700 Insomnia, unspecified: Secondary | ICD-10-CM | POA: Diagnosis not present

## 2018-02-04 DIAGNOSIS — G894 Chronic pain syndrome: Secondary | ICD-10-CM | POA: Diagnosis not present

## 2018-02-04 DIAGNOSIS — M25519 Pain in unspecified shoulder: Secondary | ICD-10-CM | POA: Diagnosis not present

## 2018-02-04 DIAGNOSIS — M545 Low back pain: Secondary | ICD-10-CM | POA: Diagnosis not present

## 2018-02-11 ENCOUNTER — Telehealth: Payer: Self-pay

## 2018-02-11 DIAGNOSIS — E119 Type 2 diabetes mellitus without complications: Secondary | ICD-10-CM

## 2018-02-11 MED ORDER — GLUCOSE BLOOD VI STRP: 1.0000 | ORAL_STRIP | Freq: Every day | 0 refills | Status: DC

## 2018-02-11 NOTE — Telephone Encounter (Signed)
Pt called to request a medication refill  -test strips to [facility] on [address] Please follow up

## 2018-02-18 DIAGNOSIS — F3132 Bipolar disorder, current episode depressed, moderate: Secondary | ICD-10-CM | POA: Diagnosis not present

## 2018-02-27 ENCOUNTER — Ambulatory Visit: Payer: Medicare HMO | Attending: Internal Medicine | Admitting: Internal Medicine

## 2018-02-27 ENCOUNTER — Encounter: Payer: Self-pay | Admitting: Internal Medicine

## 2018-02-27 VITALS — BP 124/80 | HR 97 | Temp 98.5°F | Resp 16 | Wt 146.7 lb

## 2018-02-27 DIAGNOSIS — Z1239 Encounter for other screening for malignant neoplasm of breast: Secondary | ICD-10-CM

## 2018-02-27 DIAGNOSIS — M25512 Pain in left shoulder: Secondary | ICD-10-CM | POA: Diagnosis not present

## 2018-02-27 DIAGNOSIS — M542 Cervicalgia: Secondary | ICD-10-CM | POA: Insufficient documentation

## 2018-02-27 DIAGNOSIS — E1165 Type 2 diabetes mellitus with hyperglycemia: Secondary | ICD-10-CM | POA: Diagnosis not present

## 2018-02-27 DIAGNOSIS — I10 Essential (primary) hypertension: Secondary | ICD-10-CM

## 2018-02-27 DIAGNOSIS — L719 Rosacea, unspecified: Secondary | ICD-10-CM | POA: Diagnosis not present

## 2018-02-27 DIAGNOSIS — Z1231 Encounter for screening mammogram for malignant neoplasm of breast: Secondary | ICD-10-CM | POA: Diagnosis not present

## 2018-02-27 DIAGNOSIS — F32A Depression, unspecified: Secondary | ICD-10-CM

## 2018-02-27 DIAGNOSIS — Z79899 Other long term (current) drug therapy: Secondary | ICD-10-CM | POA: Insufficient documentation

## 2018-02-27 DIAGNOSIS — R200 Anesthesia of skin: Secondary | ICD-10-CM | POA: Diagnosis not present

## 2018-02-27 DIAGNOSIS — E119 Type 2 diabetes mellitus without complications: Secondary | ICD-10-CM | POA: Insufficient documentation

## 2018-02-27 DIAGNOSIS — F329 Major depressive disorder, single episode, unspecified: Secondary | ICD-10-CM | POA: Insufficient documentation

## 2018-02-27 DIAGNOSIS — Z7951 Long term (current) use of inhaled steroids: Secondary | ICD-10-CM | POA: Diagnosis not present

## 2018-02-27 DIAGNOSIS — F418 Other specified anxiety disorders: Secondary | ICD-10-CM | POA: Diagnosis not present

## 2018-02-27 DIAGNOSIS — G8929 Other chronic pain: Secondary | ICD-10-CM | POA: Insufficient documentation

## 2018-02-27 DIAGNOSIS — E559 Vitamin D deficiency, unspecified: Secondary | ICD-10-CM | POA: Diagnosis not present

## 2018-02-27 DIAGNOSIS — E118 Type 2 diabetes mellitus with unspecified complications: Secondary | ICD-10-CM | POA: Diagnosis not present

## 2018-02-27 DIAGNOSIS — E781 Pure hyperglyceridemia: Secondary | ICD-10-CM

## 2018-02-27 DIAGNOSIS — Z7984 Long term (current) use of oral hypoglycemic drugs: Secondary | ICD-10-CM | POA: Diagnosis not present

## 2018-02-27 DIAGNOSIS — IMO0001 Reserved for inherently not codable concepts without codable children: Secondary | ICD-10-CM

## 2018-02-27 LAB — POCT GLYCOSYLATED HEMOGLOBIN (HGB A1C): HbA1c, POC (controlled diabetic range): 7.6 % — AB (ref 0.0–7.0)

## 2018-02-27 LAB — GLUCOSE, POCT (MANUAL RESULT ENTRY): POC Glucose: 137 mg/dl — AB (ref 70–99)

## 2018-02-27 NOTE — Progress Notes (Signed)
Patient ID: Amy Glass, female    DOB: 19-Nov-1960  MRN: [id number]  CC: Diabetes and Hypertension   Subjective: Amy Glass is a 57 y.o. female who presents for chronic ds management.  Her concerns today include:  Her concerns today include: Patient with history of DM, HTN, depression/anxiety, chronic neck pain, elevated TG  LT shoulder pain: saw [facility]. Needs surgery but has not scheduled as yet.  Also told she needs surgery on LT thumb.  Numbness medial surface of RT thumb x 5- 6 mths. Index finger sensitive to touch.   Seeing, Dr. Mirna Mires, Pain specialist at [facility].  She is on Hydrocodone TID for chronic back and shoulder pain -   DM:   Changed diet a lot.  More veggies and fruits, less meats and rice. Compliant with [employer] Not as active as she would like to be.  She plans to improve on this  HTN: On lisinopril.  Reports that dose was recently increased from 20 mg to 40 mg by her psychiatrist because her blood pressure was high on a recent visit.  She limits salt in the foods.     HL:  Did not take Tricor.  "It hurts my stomach."  Depression:  Sees psychiatrist and counselor at [facility].  On Effexor and trazodone.  Patient Active Problem List   Diagnosis Date Noted  . HTN (hypertension) 12/26/2016  . Depression 02/08/2016  . Scalp mass 02/08/2016  . Rosacea 02/08/2016  . Right shoulder pain 12/22/2015  . Cerumen impaction 12/22/2015  . Allergic sinusitis 12/22/2015  . Patient desires pregnancy 12/22/2015  . Dysphagia   . Acanthosis nigricans 09/27/2015  . TMJ pain dysfunction syndrome 01/16/2015  . Poor dentition 01/16/2015  . Lumbar degenerative disc disease 01/10/2015  . Vitamin D deficiency 12/05/2014  . High triglycerides 12/05/2014  . Diabetes type 2, controlled ([place]) 12/02/2014  . Lateral pain of right hip 12/02/2014  . Numbness and tingling in right hand 12/02/2014     Current Outpatient Medications on File Prior to Visit    Medication Sig Dispense Refill  . HYDROcodone-acetaminophen (NORCO) 10-325 MG tablet Take 1 tablet by mouth every 8 (eight) hours as needed.    . Blood Glucose Monitoring Suppl (TRUE METRIX METER) w/Device KIT 1 each by Does not apply route as needed. 1 kit 0  . fluticasone (FLONASE) 50 MCG/ACT nasal spray Place 1 spray into both nostrils daily. 16 g 2  . glucose blood (TRUE METRIX BLOOD GLUCOSE TEST) test strip 1 each by Other route daily. MUST MAKE APPT FOR FURTHER REFILLS 100 each 0  . ketotifen (ZADITOR) 0.025 % ophthalmic solution Place 1 drop into both eyes 2 (two) times daily. (Patient taking differently: Place 1 drop into both eyes 2 (two) times daily as needed (allergies). ) 10 mL 1  . lisinopril (PRINIVIL,ZESTRIL) 20 MG tablet Take 1 tablet (20 mg total) by mouth daily. (Patient taking differently: Take 40 mg by mouth daily. ) 30 tablet 6  . naproxen (NAPROSYN) 500 MG tablet Take 1 tablet (500 mg total) by mouth 2 (two) times daily. 30 tablet 0  . sitaGLIPtin-metformin (JANUMET) 50-500 MG tablet Take 1 tablet by mouth 2 (two) times daily with a meal. 60 tablet 6  . traZODone (DESYREL) 50 MG tablet Take 1 tablet (50 mg total) by mouth at bedtime. 30 tablet 5  . TRUEPLUS LANCETS 28G MISC 1 each by Does not apply route 3 (three) times daily. 30 each 11  . venlafaxine  XR (EFFEXOR-XR) 150 MG 24 hr capsule TAKE 1 CAPSULE BY MOUTH DAILY WITH BREAKFAST 30 capsule 0   No current facility-administered medications on file prior to visit.     Allergies  Allergen Reactions  . Nitroglycerin Other (See Comments)    Migraines, (only tried nitro patch. Has never tried the pills)  . Tricor [Fenofibrate]     Stomach upset    Social History   Socioeconomic History  . Marital status: Married    Spouse name: Not on file  . Number of children: Not on file  . Years of education: Not on file  . Highest education level: Not on file  Occupational History  . Occupation: Investment banker, operational  .  Financial resource strain: Not on file  . Food insecurity:    Worry: Not on file    Inability: Not on file  . Transportation needs:    Medical: Not on file    Non-medical: Not on file  Tobacco Use  . Smoking status: Never Smoker  . Smokeless tobacco: Never Used  Substance and Sexual Activity  . Alcohol use: No    Alcohol/week: 0.0 standard drinks  . Drug use: No  . Sexual activity: Not on file  Lifestyle  . Physical activity:    Days per week: Not on file    Minutes per session: Not on file  . Stress: Not on file  Relationships  . Social connections:    Talks on phone: Not on file    Gets together: Not on file    Attends religious service: Not on file    Active member of club or organization: Not on file    Attends meetings of clubs or organizations: Not on file    Relationship status: Not on file  . Intimate partner violence:    Fear of current or ex partner: Not on file    Emotionally abused: Not on file    Physically abused: Not on file    Forced sexual activity: Not on file  Other Topics Concern  . Not on file  Social History Narrative  . Not on file    Family History  Problem Relation Age of Onset  . Diabetes Mother   . Heart disease Mother   . Hyperlipidemia Mother   . Hypertension Mother   . Renal cancer Maternal Aunt   . Stomach cancer Maternal Uncle   . Colon cancer Neg Hx   . Esophageal cancer Neg Hx   . Rectal cancer Neg Hx     Past Surgical History:  Procedure Laterality Date  . BACK SURGERY  2002   lumbar  . BROW LIFT Bilateral 09/17/2016   Procedure: BLEPHAROPLASTY;  Surgeon: Irene Limbo, MD;  Location: [facility];  Service: Plastics;  Laterality: Bilateral;  . CARPAL TUNNEL RELEASE Right   . CARPAL TUNNEL RELEASE Left 06/2016  . CESAREAN SECTION  05/20/2003   . ESOPHAGEAL MANOMETRY N/A 10/25/2015   Procedure: ESOPHAGEAL MANOMETRY (EM);  Surgeon: Jerene Bears, MD;  Location: [facility];  Service: Gastroenterology;   Laterality: N/A;  . PTOSIS REPAIR Bilateral 09/17/2016   Procedure: BILATERAL PTOSIS REPAIR EYELID WITH SUTURE TECHNIQUE, BILATERAL UPPER LID BLEPHAROPLASTY WITH EXCESS SKIN WEIGHING EYELID DOWN.;  Surgeon: Irene Limbo, MD;  Location: [facility];  Service: Plastics;  Laterality: Bilateral;  . SHOULDER SURGERY  08/2013    b/l shoulder   . SHOULDER SURGERY Right 11/2015    ROS: Review of Systems Negative except as stated  above PHYSICAL EXAM: BP 124/80   Pulse 97   Temp 98.5 F (36.9 C) (Oral)   Resp 16   Wt 146 lb 11.2 oz (66.5 kg)   LMP 05/30/2015   SpO2 98%   BMI 29.63 kg/m   Physical Exam  General appearance - alert, well appearing, and in no distress Mental status - normal mood, behavior, speech, dress, motor activity, and thought processes Chest - clear to auscultation, no wheezes, rales or rhonchi, symmetric air entry Heart - normal rate, regular rhythm, normal S1, S2, no murmurs, rubs, clicks or gallops Musculoskeletal -right hand: No edema or erythema. No point tenderness Extremities -no LE edema  Results for orders placed or performed in visit on 02/27/18  POCT glucose (manual entry)  Result Value Ref Range   POC Glucose 137 (A) 70 - 99 mg/dl  POCT glycosylated hemoglobin (Hb A1C)  Result Value Ref Range   Hemoglobin A1C     HbA1c POC (<> result, manual entry)     HbA1c, POC (prediabetic range)     HbA1c, POC (controlled diabetic range) 7.6 (A) 0.0 - 7.0 %    ASSESSMENT AND PLAN: 1. Diabetes mellitus type 2, uncontrolled, without complications (HCC) But close to goal.  Continue current dose of Janumet.  Patient will work on starting some type of physical activity 3 to 4 days a week for 30 minutes. - Microalbumin / creatinine urine ratio - POCT glucose (manual entry) - POCT glycosylated hemoglobin (Hb A1C)  2. Essential hypertension At goal.  Continue lisinopril.  I have updated her records to reflect 40 mg daily. 3.  Hypertriglyceridemia Patient intolerant of statins and also intolerant of TriCor.  She is agreeable to a fasting lipid profile.  If she still has significant elevation in triglyceride will refer her to lipid clinic. - Lipid panel; Future - Hepatic Function Panel; Future  4. Depression, unspecified depression type Followed by psychiatry.  5. Breast cancer screening - MM Digital Screening; Future   Patient was given the opportunity to ask questions.  Patient verbalized understanding of the plan and was able to repeat key elements of the plan.   Orders Placed This Encounter  Procedures  . MM Digital Screening  . Microalbumin / creatinine urine ratio  . Lipid panel  . Hepatic Function Panel  . POCT glucose (manual entry)  . POCT glycosylated hemoglobin (Hb A1C)     Requested Prescriptions    No prescriptions requested or ordered in this encounter    Return in about 4 months (around 06/29/2018).  Karle Plumber, MD, FACP

## 2018-02-27 NOTE — Patient Instructions (Signed)
Please get call and schedule an eye exam with an an optometrist or ophthalmologist.  Try to get in some form of regular exercise at least 3 times a week for 30 minutes.

## 2018-02-27 NOTE — Progress Notes (Signed)
c 

## 2018-02-28 LAB — MICROALBUMIN / CREATININE URINE RATIO
Creatinine, Urine: 140.4 mg/dL
MICROALB/CREAT RATIO: 18.7 mg/g{creat} (ref 0.0–30.0)
MICROALBUM., U, RANDOM: 26.2 ug/mL

## 2018-03-02 ENCOUNTER — Encounter: Payer: Self-pay | Admitting: Internal Medicine

## 2018-03-03 DIAGNOSIS — F410 Panic disorder [episodic paroxysmal anxiety] without agoraphobia: Secondary | ICD-10-CM | POA: Diagnosis not present

## 2018-03-03 DIAGNOSIS — F411 Generalized anxiety disorder: Secondary | ICD-10-CM | POA: Diagnosis not present

## 2018-03-03 DIAGNOSIS — F3132 Bipolar disorder, current episode depressed, moderate: Secondary | ICD-10-CM | POA: Diagnosis not present

## 2018-03-03 DIAGNOSIS — F5101 Primary insomnia: Secondary | ICD-10-CM | POA: Diagnosis not present

## 2018-03-04 DIAGNOSIS — G894 Chronic pain syndrome: Secondary | ICD-10-CM | POA: Diagnosis not present

## 2018-03-04 DIAGNOSIS — M5416 Radiculopathy, lumbar region: Secondary | ICD-10-CM | POA: Diagnosis not present

## 2018-03-04 DIAGNOSIS — M545 Low back pain: Secondary | ICD-10-CM | POA: Diagnosis not present

## 2018-03-04 DIAGNOSIS — Z79891 Long term (current) use of opiate analgesic: Secondary | ICD-10-CM | POA: Diagnosis not present

## 2018-03-04 DIAGNOSIS — G4700 Insomnia, unspecified: Secondary | ICD-10-CM | POA: Diagnosis not present

## 2018-03-04 DIAGNOSIS — M25519 Pain in unspecified shoulder: Secondary | ICD-10-CM | POA: Diagnosis not present

## 2018-04-03 DIAGNOSIS — F3132 Bipolar disorder, current episode depressed, moderate: Secondary | ICD-10-CM | POA: Diagnosis not present

## 2018-04-08 ENCOUNTER — Ambulatory Visit: Payer: Medicare HMO | Admitting: Gastroenterology

## 2018-04-10 DIAGNOSIS — G894 Chronic pain syndrome: Secondary | ICD-10-CM | POA: Diagnosis not present

## 2018-04-10 DIAGNOSIS — M545 Low back pain: Secondary | ICD-10-CM | POA: Diagnosis not present

## 2018-04-10 DIAGNOSIS — M5416 Radiculopathy, lumbar region: Secondary | ICD-10-CM | POA: Diagnosis not present

## 2018-04-10 DIAGNOSIS — M25519 Pain in unspecified shoulder: Secondary | ICD-10-CM | POA: Diagnosis not present

## 2018-04-10 DIAGNOSIS — G4700 Insomnia, unspecified: Secondary | ICD-10-CM | POA: Diagnosis not present

## 2018-04-10 DIAGNOSIS — Z79891 Long term (current) use of opiate analgesic: Secondary | ICD-10-CM | POA: Diagnosis not present

## 2018-04-27 ENCOUNTER — Ambulatory Visit: Payer: Medicare HMO | Attending: Internal Medicine

## 2018-04-27 DIAGNOSIS — E781 Pure hyperglyceridemia: Secondary | ICD-10-CM | POA: Insufficient documentation

## 2018-04-27 NOTE — Progress Notes (Signed)
Patient here for lab visit only 

## 2018-04-29 ENCOUNTER — Other Ambulatory Visit: Payer: Self-pay

## 2018-04-29 ENCOUNTER — Telehealth: Payer: Self-pay

## 2018-04-29 ENCOUNTER — Other Ambulatory Visit: Payer: Self-pay | Admitting: Internal Medicine

## 2018-04-29 DIAGNOSIS — E781 Pure hyperglyceridemia: Secondary | ICD-10-CM

## 2018-04-29 DIAGNOSIS — E118 Type 2 diabetes mellitus with unspecified complications: Secondary | ICD-10-CM

## 2018-04-29 MED ORDER — GEMFIBROZIL 600 MG PO TABS: 600.0000 mg | ORAL_TABLET | Freq: Two times a day (BID) | ORAL | 2 refills | Status: DC

## 2018-04-29 MED ORDER — GEMFIBROZIL 600 MG PO TABS
600.0000 mg | ORAL_TABLET | Freq: Two times a day (BID) | ORAL | 2 refills | Status: DC
Start: 2018-04-29 — End: 2018-04-29

## 2018-04-29 MED ORDER — SITAGLIPTIN PHOS-METFORMIN HCL 50-500 MG PO TABS: 1.0000 | ORAL_TABLET | Freq: Two times a day (BID) | ORAL | 6 refills | Status: DC

## 2018-04-29 MED FILL — JANUMET 50-500 MG TABLET: 50-500 | 30 days supply | Qty: 60 | Fill #0

## 2018-04-29 MED FILL — GEMFIBROZIL 600 MG TAB: 600 | 30 days supply | Qty: 60 | Fill #0

## 2018-04-29 NOTE — Telephone Encounter (Signed)
Contacted pt to go over lab results pt is aware and doesn't have any questions or concerns 

## 2018-05-02 ENCOUNTER — Other Ambulatory Visit: Payer: Self-pay | Admitting: Internal Medicine

## 2018-05-02 DIAGNOSIS — F32A Depression, unspecified: Secondary | ICD-10-CM

## 2018-05-02 DIAGNOSIS — F329 Major depressive disorder, single episode, unspecified: Secondary | ICD-10-CM

## 2018-05-04 ENCOUNTER — Other Ambulatory Visit: Payer: Self-pay | Admitting: Internal Medicine

## 2018-05-04 ENCOUNTER — Encounter: Payer: Self-pay | Admitting: Internal Medicine

## 2018-05-04 DIAGNOSIS — R945 Abnormal results of liver function studies: Secondary | ICD-10-CM

## 2018-05-04 DIAGNOSIS — R7989 Other specified abnormal findings of blood chemistry: Secondary | ICD-10-CM

## 2018-05-04 LAB — HEPATIC FUNCTION PANEL
ALBUMIN: 4.6 g/dL (ref 3.5–5.5)
ALK PHOS: 131 IU/L — AB (ref 39–117)
ALT: 79 IU/L — ABNORMAL HIGH (ref 0–32)
AST: 74 IU/L — ABNORMAL HIGH (ref 0–40)
BILIRUBIN TOTAL: 0.2 mg/dL (ref 0.0–1.2)
BILIRUBIN, DIRECT: 0.1 mg/dL (ref 0.00–0.40)
TOTAL PROTEIN: 7.5 g/dL (ref 6.0–8.5)

## 2018-05-04 LAB — LIPID PANEL
CHOL/HDL RATIO: 33.2 ratio — AB (ref 0.0–4.4)
CHOLESTEROL TOTAL: 498 mg/dL — AB (ref 100–199)
HDL: 15 mg/dL — ABNORMAL LOW (ref >39)
Triglycerides: 2584 mg/dL (ref 0–149)

## 2018-05-05 ENCOUNTER — Telehealth: Payer: Self-pay | Admitting: Internal Medicine

## 2018-05-05 ENCOUNTER — Telehealth: Payer: Self-pay

## 2018-05-05 NOTE — Telephone Encounter (Signed)
Patient needs you to call her because she has pain in her left side

## 2018-05-05 NOTE — Telephone Encounter (Signed)
Contacted pt to go over lab results pt is aware and doesn't have any questions or concerns 

## 2018-05-06 ENCOUNTER — Other Ambulatory Visit: Payer: Self-pay | Admitting: Family Medicine

## 2018-05-06 ENCOUNTER — Encounter: Payer: Self-pay | Admitting: Family Medicine

## 2018-05-06 ENCOUNTER — Ambulatory Visit (INDEPENDENT_AMBULATORY_CARE_PROVIDER_SITE_OTHER): Payer: Medicare HMO | Admitting: Family Medicine

## 2018-05-06 VITALS — BP 121/66 | HR 101 | Temp 98.2°F | Resp 16 | Ht 59.0 in | Wt 142.4 lb

## 2018-05-06 DIAGNOSIS — F331 Major depressive disorder, recurrent, moderate: Secondary | ICD-10-CM

## 2018-05-06 DIAGNOSIS — N63 Unspecified lump in unspecified breast: Secondary | ICD-10-CM | POA: Diagnosis not present

## 2018-05-06 NOTE — Patient Instructions (Addendum)
[facility]   [place], [place]  Phone: [phone]  Your appointment for your diagnostic breast exam  is scheduled for 05/11/2018 at 1:30.      You have repeat blood work schedule at [facility]  please make a lab visit to have lab drawn.   Breast Self-Awareness Breast self-awareness means:  Knowing how your breasts look.  Knowing how your breasts feel.  Checking your breasts every month for changes.  Telling your doctor if you notice a change in your breasts.  Breast self-awareness allows you to notice a breast problem early while it is still small. How to do a breast self-exam One way to learn what is normal for your breasts and to check for changes is to do a breast self-exam. To do a breast self-exam: Look for Changes  1. Take off all the clothes above your waist. 2. Stand in front of a mirror in a room with good lighting. 3. Put your hands on your hips. 4. Push your hands down. 5. Look at your breasts and nipples in the mirror to see if one breast or nipple looks different than the other. Check to see if: ? The shape of one breast is different. ? The size of one breast is different. ? There are wrinkles, dips, and bumps in one breast and not the other. 6. Look at each breast for changes in your skin, such as: ? Redness. ? Scaly areas. 7. Look for changes in your nipples, such as: ? Liquid around the nipples. ? Bleeding. ? Dimpling. ? Redness. ? A change in where the nipples are. Feel for Changes 1. Lie on your back on the floor. 2. Feel each breast. To do this, follow these steps: ? Pick a breast to feel. ? Put the arm closest to that breast above your head. ? Use your other arm to feel the nipple area of your breast. Feel the area with the pads of your three middle fingers by making small circles with your fingers. For the first circle, press lightly. For the second circle, press harder. For the third circle,  press even harder. ? Keep making circles with your fingers at the light, harder, and even harder pressures as you move down your breast. Stop when you feel your ribs. ? Move your fingers a little toward the center of your body. ? Start making circles with your fingers again, this time going up until you reach your collarbone. ? Keep making up and down circles until you reach your armpit. Remember to keep using the three pressures. ? Feel the other breast in the same way. 3. Sit or stand in the shower or tub. 4. With soapy water on your skin, feel each breast the same way you did in step 2, when you were lying on the floor. Write Down What You Find  After doing the self-exam, write down:  What is normal for each breast.  Any changes you find in each breast.  When you last had your period.  How often should I check my breasts? Check your breasts every month. If you are breastfeeding, the best time to check them is after you feed your baby or after you use a breast pump. If you get periods, the best time to check your breasts is 5-7 days after your period is over. When should I see my doctor? See your doctor if you notice:  A change in shape or size of your  breasts or nipples.  A change in the skin of your breast or nipples, such as red or scaly skin.  Unusual fluid coming from your nipples.  A lump or thick area that was not there before.  Pain in your breasts.  Anything that concerns you.  This information is not intended to replace advice given to you by your health care provider. Make sure you discuss any questions you have with your health care provider. Document Released: 12/25/2007 Document Revised: 12/14/2015 Document Reviewed: 05/28/2015 Elsevier Interactive Patient Education  2018 [name].             Suicidal Feelings: How to Help Yourself Suicide is the taking of one's own life. If you feel as though life is getting too tough to handle and are  thinking about suicide, get help right away. To get help:  Call your local emergency services (911 in the [place]  Call a suicide hotline to speak with a trained counselor who understands how you are feeling. The following is a list of suicide hotlines in the [place]. For a list of hotlines in [place], visit FindSkins.pl. ? 1-800-273-TALK 602-525-0364). ? 1-800-SUICIDE (251) 431-0571). ? (418)029-9824. This is a hotline for Spanish speakers. ? 1-800-799-4TTY (920)797-6369). This is a hotline for TTY users. ? 1-866-4-U-TREVOR (534) 021-4478). This is a hotline for lesbian, gay, bisexual, transgender, or questioning youth.  Contact a crisis center or a local suicide prevention center. To find a crisis center or suicide prevention center: ? Call your local hospital, clinic, community service organization, mental health center, social service provider, or health department. Ask for assistance in connecting to a crisis center. ? Visit BankingRep.com.au for a list of crisis centers in the [place], or visit www.suicideprevention.ca/thinking-about-suicide/find-a-crisis-centre for a list of centers in [place].  Visit the following websites: ? National Suicide Prevention Lifeline: www.suicidepreventionlifeline.org ? Hopeline: www.hopeline.com ? [employer] for Suicide Prevention: PromotionalLoans.co.za ? The [employer] (for lesbian, gay, bisexual, transgender, or questioning youth): www.thetrevorproject.org  How can I help myself feel better?  Promise yourself that you will not do anything drastic when you have suicidal feelings. Remember, there is hope. Many people have gotten through suicidal thoughts and feelings, and you will, too. You may have gotten through them before, and this proves that you can get through them again.  Let family, friends, teachers, or counselors know how you are  feeling. Try not to isolate yourself from those who care about you. Remember, they will want to help you. Talk with someone every day, even if you do not feel sociable. Face-to-face conversation is best.  Call a mental health professional and see one regularly.  Visit your primary health care provider every year.  Eat a well-balanced diet, and space your meals so you eat regularly.  Get plenty of rest.  Avoid alcohol and drugs, and remove them from your home. They will only make you feel worse.  If you are thinking of taking a lot of medicine, give your medicine to someone who can give it to you one day at a time. If you are on antidepressants and are concerned you will overdose, let your health care provider know so he or she can give you safer medicines. Ask your mental health professional about the possible side effects of any medicines you are taking.  Remove weapons, poisons, knives, and anything else that could harm you from your home.  Try to stick to routines. Follow a schedule every day. Put self-care on your schedule.  Make a  list of realistic goals, and cross them off when you achieve them. Accomplishments give a sense of worth.  Wait until you are feeling better before doing the things you find difficult or unpleasant.  Exercise if you are able. You will feel better if you exercise for even a half hour each day.  Go out in the sun or into nature. This will help you recover from depression faster. If you have a favorite place to walk, go there.  Do the things that have always given you pleasure. Play your favorite music, read a good book, paint a picture, play your favorite instrument, or do anything else that takes your mind off your depression if it is safe to do.  Keep your living space well lit.  When you are feeling well, write yourself a letter about tips and support that you can read when you are not feeling well.  Remember that life's difficulties can be sorted out  with help. Conditions can be treated. You can work on thoughts and strategies that serve you well. This information is not intended to replace advice given to you by your health care provider. Make sure you discuss any questions you have with your health care provider. Document Released: 01/12/2003 Document Revised: 03/06/2016 Document Reviewed: 11/02/2013 Elsevier Interactive Patient Education  [employer].

## 2018-05-06 NOTE — Progress Notes (Signed)
Amy Glass, is a 57 y.o. female  XFG:[id number]  RCV:[id number]  DOB - 02/15/1961  CC:  Chief Complaint  Patient presents with  . Breast Mass    L breast lump(quarter sized) directly above the nipple. first noticed it yday. denies any discharge or skin discoloration. states that it is tender to the touch       HPI: Amy Glass is a 57 y.o. female is here today to establish care.   Amy Glass has Diabetes type 2, controlled ([place]); Vitamin D deficiency; High triglycerides; Lumbar degenerative disc disease; TMJ pain dysfunction syndrome; Poor dentition; Acanthosis nigricans; Depression; Rosacea; and HTN (hypertension) on their problem list.    Today's visit:  Patient was showering yesterday and noticed a lump behind the nipple of her left breast. Complains of associated nipple tenderness. No nipple drainage or discharge. No changesFamily history significant for breast cancer on her father side of family (aunts and 1/2 sister). She has no history of abnormal abnormal mammograms, last normal screening in March 2017.  Depression, Major  Currently active. Reports taking to many pain pills over 1 month ago and realized that she needed help. Reports that she wasn't attempting to kill herself, but wanted to achieve a deep sleep and woke up vomiting and feeling very ill. She is followed by behavioral health at the [facility] for therapy and recently joined a church which meets at least twice per week which she feels is helping with her mood. She attributes depression to things that have occurred in her past (would not elaborate). She has a teenage daughter which motivates her to try and feel better. She is also exercising in efforts to lose weight.   Depression screen [facility] 2/9 05/06/2018 08/25/2017 06/26/2017 05/24/2016 02/08/2016  Decreased Interest 0 '2 1 1 3  ' Down, Depressed, Hopeless '1 2 1 1 3  ' PHQ - 2 Score '1 4 2 2 6  ' Altered sleeping '1 2 2 2 3  ' Tired, decreased energy '1 2 1 2 3  ' Change in  appetite 2 1 0 2 3  Feeling bad or failure about yourself  '3 2 2 2 3  ' Trouble concentrating '1 2 2 2 3  ' Moving slowly or fidgety/restless 0 '2 1 2 3  ' Suicidal thoughts '2 2 1 2 1  ' PHQ-9 Score '11 17 11 16 25  ' Difficult doing work/chores - - - - Very difficult    Patient denies new headaches, chest pain, abdominal pain, nausea, new weakness , numbness or tingling, SOB, edema, or worrisome cough.   Current medications: Current Outpatient Medications:  .  Blood Glucose Monitoring Suppl (TRUE METRIX METER) w/Device KIT, 1 each by Does not apply route as needed., Disp: 1 kit, Rfl: 0 .  fluticasone (FLONASE) 50 MCG/ACT nasal spray, Place 1 spray into both nostrils daily., Disp: 16 g, Rfl: 2 .  gemfibrozil (LOPID) 600 MG tablet, Take 1 tablet (600 mg total) by mouth 2 (two) times daily before a meal., Disp: 60 tablet, Rfl: 2 .  glucose blood (TRUE METRIX BLOOD GLUCOSE TEST) test strip, 1 each by Other route daily. MUST MAKE APPT FOR FURTHER REFILLS, Disp: 100 each, Rfl: 0 .  HYDROcodone-acetaminophen (NORCO) 10-325 MG tablet, Take 1 tablet by mouth every 8 (eight) hours as needed., Disp: , Rfl:  .  ketotifen (ZADITOR) 0.025 % ophthalmic solution, Place 1 drop into both eyes 2 (two) times daily. (Patient taking differently: Place 1 drop into both eyes 2 (two) times daily as needed (allergies). ), Disp: 10  mL, Rfl: 1 .  lisinopril (PRINIVIL,ZESTRIL) 20 MG tablet, Take 1 tablet (20 mg total) by mouth daily. (Patient taking differently: Take 40 mg by mouth daily. ), Disp: 30 tablet, Rfl: 6 .  naproxen (NAPROSYN) 500 MG tablet, Take 1 tablet (500 mg total) by mouth 2 (two) times daily., Disp: 30 tablet, Rfl: 0 .  sitaGLIPtin-metformin (JANUMET) 50-500 MG tablet, Take 1 tablet by mouth 2 (two) times daily with a meal., Disp: 60 tablet, Rfl: 6 .  traZODone (DESYREL) 50 MG tablet, Take 1 tablet (50 mg total) by mouth at bedtime., Disp: 30 tablet, Rfl: 5 .  TRUEPLUS LANCETS 28G MISC, 1 each by Does not apply  route 3 (three) times daily., Disp: 30 each, Rfl: 11 .  venlafaxine XR (EFFEXOR-XR) 150 MG 24 hr capsule, TAKE 1 CAPSULE BY MOUTH EVERY DAY WITH BREAKFAST, Disp: 30 capsule, Rfl: 2   Pertinent family medical history: family history includes Diabetes in her mother; Heart disease in her mother; Hyperlipidemia in her mother; Hypertension in her mother; Renal cancer in her maternal aunt; Stomach cancer in her maternal uncle.   Allergies  Allergen Reactions  . Nitroglycerin Other (See Comments)    Migraines, (only tried nitro patch. Has never tried the pills)  . Tricor [Fenofibrate]     Stomach upset    Social History   Socioeconomic History  . Marital status: Married    Spouse name: Not on file  . Number of children: Not on file  . Years of education: Not on file  . Highest education level: Not on file  Occupational History  . Occupation: Investment banker, operational  . Financial resource strain: Not on file  . Food insecurity:    Worry: Not on file    Inability: Not on file  . Transportation needs:    Medical: Not on file    Non-medical: Not on file  Tobacco Use  . Smoking status: Never Smoker  . Smokeless tobacco: Never Used  Substance and Sexual Activity  . Alcohol use: No    Alcohol/week: 0.0 standard drinks  . Drug use: No  . Sexual activity: Not on file  Lifestyle  . Physical activity:    Days per week: Not on file    Minutes per session: Not on file  . Stress: Not on file  Relationships  . Social connections:    Talks on phone: Not on file    Gets together: Not on file    Attends religious service: Not on file    Active member of club or organization: Not on file    Attends meetings of clubs or organizations: Not on file    Relationship status: Not on file  . Intimate partner violence:    Fear of current or ex partner: Not on file    Emotionally abused: Not on file    Physically abused: Not on file    Forced sexual activity: Not on file  Other Topics Concern  .  Not on file  Social History Narrative  . Not on file   Review of System Pertinent negatives listed in HPI Objective:   Vitals:   05/06/18 1336  BP: 121/66  Pulse: (!) 101  Resp: 16  Temp: 98.2 F (36.8 C)  SpO2: 96%    BP Readings from Last 3 Encounters:  05/06/18 121/66  02/27/18 124/80  08/31/17 (!) 149/89    Filed Weights   05/06/18 1336  Weight: 142 lb 6.4 oz (64.6 kg)  Physical Exam: Constitutional: Patient appears well-developed and well-nourished. No distress. HENT: Normocephalic, atraumatic, External right and left ear normal.  Eyes: Conjunctivae and EOM are normal. PERRLA, no scleral icterus. Neck: Normal ROM. Neck supple. No JVD. No tracheal deviation. No thyromegaly. CVS: RRR, S1/S2 +, no murmurs, no gallops, no carotid bruit.  Pulmonary: Effort and breath sounds normal, no stridor, rhonchi, wheezes, rales.  Breast: Breasts are symmetric without cutaneous changes. Left nipple slight erythema present. Solid nodule noted behind areola at 12 o'clock -6 o'clock position Skin: Skin is warm and dry. No rash noted. Not diaphoretic. No erythema. No pallor. Psychiatric: Flat affect. Anxious. Denies suicidal or homicidal ideation.  Behavior, judgment, thought content normal. Lab Results (prior encounters)  Lab Results  Component Value Date   WBC 6.0 04/30/2017   HGB 13.6 04/30/2017   HCT 41.5 04/30/2017   MCV 86.3 04/30/2017   PLT 235 04/30/2017   Lab Results  Component Value Date   CREATININE 0.67 04/30/2017   BUN 11 04/30/2017   NA 135 04/30/2017   K 4.3 04/30/2017   CL 99 (L) 04/30/2017   CO2 25 04/30/2017    Lab Results  Component Value Date   HGBA1C 7.6 (A) 02/27/2018       Component Value Date/Time   CHOL 498 (H) 04/27/2018 1623   TRIG 2,584 (HH) 04/27/2018 1623   HDL 15 (L) 04/27/2018 1623   CHOLHDL 33.2 (H) 04/27/2018 1623   CHOLHDL 12.6 (H) 12/21/2015 1725   VLDL NOT CALC 12/21/2015 1725   LDLCALC Comment 04/27/2018 1623         Assessment and plan:  1. Breast nodule, left breast 12 o'clock and 6 o'clock position, referred to breast center for diagnostic mammogram left breast. Appointment made in office and provided to patient.  2. Moderate episode of recurrent major depressive disorder ([place]), chronic on-going. Recent suicidal ideation. Per PHQ-9 this is an on-going problem. -Continue Venlafaxine -Continue [facility] -Will have our social worker follow-up with patient to ensure all mental health needs are met.   Per your PCP Return for lab visit 1 week liver enzymes check fasting at [facility].  The patient was given clear instructions to go to ER or return to medical center if symptoms don't improve, worsen or new problems develop. The patient verbalized understanding. The patient was advised  to call and obtain lab results if they haven't heard anything from out office within 7-10 business days.   Molli Barrows, FNP Primary Care at [facility] [address], [place] [phone]: [phone]  A total of 25 minutes spent, greater than 50 % of this time was spent counseling and coordination of care.   This note has been created with DSurveyor, quantity Any transcriptional errors are unintentional.

## 2018-05-07 ENCOUNTER — Other Ambulatory Visit: Payer: Self-pay

## 2018-05-07 ENCOUNTER — Other Ambulatory Visit: Payer: Self-pay | Admitting: Family Medicine

## 2018-05-07 DIAGNOSIS — N63 Unspecified lump in unspecified breast: Secondary | ICD-10-CM

## 2018-05-11 ENCOUNTER — Other Ambulatory Visit: Payer: Medicare HMO

## 2018-05-11 ENCOUNTER — Other Ambulatory Visit: Payer: Self-pay | Admitting: Family Medicine

## 2018-05-11 ENCOUNTER — Ambulatory Visit
Admission: RE | Admit: 2018-05-11 | Discharge: 2018-05-11 | Disposition: A | Discharge status: Home / Self Care | Payer: Medicare HMO | Source: Ambulatory Visit | Attending: Family Medicine | Admitting: Family Medicine

## 2018-05-11 DIAGNOSIS — N6320 Unspecified lump in the left breast, unspecified quadrant: Secondary | ICD-10-CM

## 2018-05-11 DIAGNOSIS — R928 Other abnormal and inconclusive findings on diagnostic imaging of breast: Secondary | ICD-10-CM | POA: Diagnosis not present

## 2018-05-11 DIAGNOSIS — N6324 Unspecified lump in the left breast, lower inner quadrant: Secondary | ICD-10-CM | POA: Diagnosis not present

## 2018-05-11 DIAGNOSIS — N6323 Unspecified lump in the left breast, lower outer quadrant: Secondary | ICD-10-CM | POA: Diagnosis not present

## 2018-05-11 DIAGNOSIS — E119 Type 2 diabetes mellitus without complications: Secondary | ICD-10-CM

## 2018-05-11 DIAGNOSIS — N61 Mastitis without abscess: Secondary | ICD-10-CM | POA: Diagnosis not present

## 2018-05-11 DIAGNOSIS — N63 Unspecified lump in unspecified breast: Secondary | ICD-10-CM

## 2018-05-11 DIAGNOSIS — F3132 Bipolar disorder, current episode depressed, moderate: Secondary | ICD-10-CM | POA: Diagnosis not present

## 2018-05-11 HISTORY — PX: BREAST BIOPSY: SHX20

## 2018-05-14 ENCOUNTER — Telehealth: Payer: Self-pay | Admitting: Licensed Clinical Social Worker

## 2018-05-14 DIAGNOSIS — F410 Panic disorder [episodic paroxysmal anxiety] without agoraphobia: Secondary | ICD-10-CM | POA: Diagnosis not present

## 2018-05-14 DIAGNOSIS — F3132 Bipolar disorder, current episode depressed, moderate: Secondary | ICD-10-CM | POA: Diagnosis not present

## 2018-05-14 NOTE — Telephone Encounter (Signed)
LCSWA placed call to patient, introduced self, and explained role at [facility]. Pt was informed of consult from PCP to address behavioral health and/or resource needs.   Pt agreed to schedule appointment with [facility] on 05/20/18. No additional concerns noted.

## 2018-05-18 ENCOUNTER — Telehealth: Payer: Self-pay | Admitting: Internal Medicine

## 2018-05-18 NOTE — Telephone Encounter (Signed)
PC placed to pt this evening to discuss breast bx results.  I got her VM.  Left message identifying myself and reason for call.  Will try to reach her again this wk.

## 2018-05-19 DIAGNOSIS — F3132 Bipolar disorder, current episode depressed, moderate: Secondary | ICD-10-CM | POA: Diagnosis not present

## 2018-05-20 ENCOUNTER — Telehealth: Payer: Self-pay | Admitting: Internal Medicine

## 2018-05-20 ENCOUNTER — Institutional Professional Consult (permissible substitution): Payer: Medicare HMO | Admitting: Licensed Clinical Social Worker

## 2018-05-20 NOTE — Telephone Encounter (Signed)
I spoke with the pathologist who did the report on pt's breast bx.  The bx showed non-caseating granulomatous inflammation.  I wanted to know if this was c/w sarcoid as I had not seen a breast bx lesion come back as a non-caseating granulomatous inflammation.  She stated that this is a nonspecific finding which usually indicates granulomatous mastitis, fat necrosis or ruptured cyst.  In the absence of other signs, symptoms to suggest sarcoid, this finding on breast bx is not indicative of sarcoid.

## 2018-05-28 ENCOUNTER — Ambulatory Visit (INDEPENDENT_AMBULATORY_CARE_PROVIDER_SITE_OTHER): Payer: Medicare HMO | Admitting: Internal Medicine

## 2018-05-28 ENCOUNTER — Encounter: Payer: Self-pay | Admitting: Internal Medicine

## 2018-05-28 VITALS — BP 129/80 | HR 82 | Ht 59.0 in | Wt 142.6 lb

## 2018-05-28 DIAGNOSIS — R079 Chest pain, unspecified: Secondary | ICD-10-CM | POA: Diagnosis not present

## 2018-05-28 DIAGNOSIS — I10 Essential (primary) hypertension: Secondary | ICD-10-CM

## 2018-05-28 DIAGNOSIS — Z9889 Other specified postprocedural states: Secondary | ICD-10-CM | POA: Diagnosis not present

## 2018-05-28 DIAGNOSIS — E781 Pure hyperglyceridemia: Secondary | ICD-10-CM | POA: Diagnosis not present

## 2018-05-28 DIAGNOSIS — R1310 Dysphagia, unspecified: Secondary | ICD-10-CM

## 2018-05-28 DIAGNOSIS — E785 Hyperlipidemia, unspecified: Secondary | ICD-10-CM

## 2018-05-28 MED ORDER — ICOSAPENT ETHYL 0.5 G PO CAPS: 4.0000 | ORAL_CAPSULE | Freq: Two times a day (BID) | ORAL | 11 refills | Status: DC

## 2018-05-28 NOTE — Progress Notes (Signed)
LIPID CLINIC CONSULT NOTE  Chief Complaint:  High triglycerides  Primary Care Physician: [name], MD  HPI:  Amy Glass is a 57 y.o. female who is being seen today for the evaluation of high triglycerides at the request of [name], MD.  This is a pleasant 57 year old female whose family is from [place], with a history of high triglycerides for many years, fatty liver disease and elevated liver enzymes.  In addition she has diabetes and hypertension.  She is referred today for evaluation of marked dyslipidemia and hypertriglyceridemia.  In discussing with her recently she has had chest pain as well.  She says she gets pain across her chest in the last few days it is been significantly worse.  It has been going on for several months and is little worse with exertion and relieved by rest.  She has been doing some more activity including exercise at a gym but finds has been a little more difficult to do that.  She also notes trouble swallowing.  She says at times pills get stuck in her throat.  Recently she was started on gemfibrozil since she had a history of stomach upset on fenofibrate.  She says she has difficulty swallowing the gemfibrozil tablets based on their size.  She does have a history of esophageal dilatation in the past, but is not actively seeing a gastroenterologist.  She denies alcohol use.  She reports fairly low-fat diet.  Over the past year however her diet was not as good as it had previously been since she has been going through a divorce.  She recently though has eaten more healthy, worked on weight loss and lost more than 15 pounds.  PMHx:  Past Medical History:  Diagnosis Date  . Allergy   . Anxiety    as child   . Depression    as child  . Diabetes mellitus without complication ([place]) Dx 6144  . Diverticulosis   . Hiatal hernia   . Hyperplastic colon polyp   . Hypertension Dx 2003  . Schatzki's ring     Past Surgical History:   Procedure Laterality Date  . BACK SURGERY  2002   lumbar  . BROW LIFT Bilateral 09/17/2016   Procedure: BLEPHAROPLASTY;  Surgeon: Irene Limbo, MD;  Location: [facility];  Service: Plastics;  Laterality: Bilateral;  . CARPAL TUNNEL RELEASE Right   . CARPAL TUNNEL RELEASE Left 06/2016  . CESAREAN SECTION  05/20/2003   . ESOPHAGEAL MANOMETRY N/A 10/25/2015   Procedure: ESOPHAGEAL MANOMETRY (EM);  Surgeon: Jerene Bears, MD;  Location: [facility];  Service: Gastroenterology;  Laterality: N/A;  . PTOSIS REPAIR Bilateral 09/17/2016   Procedure: BILATERAL PTOSIS REPAIR EYELID WITH SUTURE TECHNIQUE, BILATERAL UPPER LID BLEPHAROPLASTY WITH EXCESS SKIN WEIGHING EYELID DOWN.;  Surgeon: Irene Limbo, MD;  Location: [facility];  Service: Plastics;  Laterality: Bilateral;  . SHOULDER SURGERY  08/2013    b/l shoulder   . SHOULDER SURGERY Right 11/2015    FAMHx:  Family History  Problem Relation Age of Onset  . Diabetes Mother   . Heart disease Mother   . Hyperlipidemia Mother   . Hypertension Mother   . Renal cancer Maternal Aunt   . Stomach cancer Maternal Uncle   . Colon cancer Neg Hx   . Esophageal cancer Neg Hx   . Rectal cancer Neg Hx     SOCHx:   reports that she has never smoked. She has never  used smokeless tobacco. She reports that she does not drink alcohol or use drugs.  ALLERGIES:  Allergies  Allergen Reactions  . Nitroglycerin Other (See Comments)    Migraines, (only tried nitro patch. Has never tried the pills)  . Tricor [Fenofibrate]     Stomach upset    ROS: Pertinent items noted in HPI and remainder of comprehensive ROS otherwise negative.  HOME MEDS: Current Outpatient Medications on File Prior to Visit  Medication Sig Dispense Refill  . Blood Glucose Monitoring Suppl (TRUE METRIX METER) w/Device KIT 1 each by Does not apply route as needed. 1 kit 0  . fluticasone (FLONASE) 50 MCG/ACT nasal spray Place 1 spray into both  nostrils daily. 16 g 2  . gemfibrozil (LOPID) 600 MG tablet Take 1 tablet (600 mg total) by mouth 2 (two) times daily before a meal. 60 tablet 2  . glucose blood (TRUE METRIX BLOOD GLUCOSE TEST) test strip Use as directed once daily 100 each 12  . HYDROcodone-acetaminophen (NORCO) 10-325 MG tablet Take 1 tablet by mouth every 8 (eight) hours as needed.    Marland Kitchen ketotifen (ZADITOR) 0.025 % ophthalmic solution Place 1 drop into both eyes 2 (two) times daily. (Patient taking differently: Place 1 drop into both eyes 2 (two) times daily as needed (allergies). ) 10 mL 1  . lisinopril (PRINIVIL,ZESTRIL) 20 MG tablet Take 1 tablet (20 mg total) by mouth daily. (Patient taking differently: Take 40 mg by mouth daily. ) 30 tablet 6  . naproxen (NAPROSYN) 500 MG tablet Take 1 tablet (500 mg total) by mouth 2 (two) times daily. 30 tablet 0  . sitaGLIPtin-metformin (JANUMET) 50-500 MG tablet Take 1 tablet by mouth 2 (two) times daily with a meal. 60 tablet 6  . traZODone (DESYREL) 50 MG tablet Take 1 tablet (50 mg total) by mouth at bedtime. 30 tablet 5  . TRUEPLUS LANCETS 28G MISC 1 each by Does not apply route 3 (three) times daily. 30 each 11  . venlafaxine XR (EFFEXOR-XR) 150 MG 24 hr capsule TAKE 1 CAPSULE BY MOUTH EVERY DAY WITH BREAKFAST 30 capsule 2   No current facility-administered medications on file prior to visit.     LABS/IMAGING: No results found for this or any previous visit (from the past 48 hour(s)). No results found.  LIPID PANEL:    Component Value Date/Time   CHOL 498 (H) 04/27/2018 1623   TRIG 2,584 (HH) 04/27/2018 1623   HDL 15 (L) 04/27/2018 1623   CHOLHDL 33.2 (H) 04/27/2018 1623   CHOLHDL 12.6 (H) 12/21/2015 1725   VLDL NOT CALC 12/21/2015 1725   LDLCALC Comment 04/27/2018 1623    WEIGHTS: Wt Readings from Last 3 Encounters:  05/28/18 142 lb 9.6 oz (64.7 kg)  05/06/18 142 lb 6.4 oz (64.6 kg)  02/27/18 146 lb 11.2 oz (66.5 kg)    VITALS: BP 129/80   Pulse 82   Ht 4'  11" (1.499 m)   Wt 142 lb 9.6 oz (64.7 kg)   LMP 05/30/2015   BMI 28.80 kg/m   EXAM: General appearance: alert and no distress Neck: no carotid bruit, no JVD and thyroid not enlarged, symmetric, no tenderness/mass/nodules Lungs: clear to auscultation bilaterally Heart: regular rate and rhythm, S1, S2 normal, no murmur, click, rub or gallop Abdomen: soft, non-tender; bowel sounds normal; no masses,  no organomegaly Extremities: extremities normal, atraumatic, no cyanosis or edema Pulses: 2+ and symmetric Skin: Skin color, texture, turgor normal. No rashes or lesions Neurologic: Grossly normal Psych: Pleasant  EKG: Normal sinus rhythm at 78, rightward axis-personally reviewed- personally reviewed  ASSESSMENT: 1. Primary hypertriglyceridemia 2. Chest pain, possibly cardiac 3. Type 2 diabetes 4. Pretension  PLAN: 1.   Davy Pique is describing chest pain which is been present for several months but seems to be worsening recently.  She also has some dysphasia and has trouble swallowing pills.  She has a history of esophageal dilatation in the past and could be having stricture or spasm.  Alternatively, she has multiple cardiac risk factors.  Her EKG is normal today, but given her poorly controlled long-standing hypertriglyceridemia, she is at risk for heart disease.  I recommend her to have a stress test.  We will pursue that as well as treating her hypertriglyceridemia.  Unfortunately she has been intolerant of fenofibrate and has trouble swallowing gemfibrozil.  The tablets, however are scored and I recommend cutting them in half to try to swallow them easier.  In addition, she is a candidate for Vascepa.  This comes in a 0.5 g capsule, however the total daily dose is 2 g twice daily.  Will prescribe that and hopefully this will be cost effective for her.  Plan follow-up after her stress test and will re-assess her lipid profile in a fasting state about 3 months from now.  Thanks again for the  kind referral.  Pixie Casino, MD, FACC, [place] Director of the Advanced Lipid Disorders &  Cardiovascular Risk Reduction Clinic Diplomate of the American Board of Clinical Lipidology Attending Cardiologist  Direct Dial: [phone]  Fax: [phone]  Website:  [email or web address]  05/28/2018, 5:12 PM

## 2018-05-28 NOTE — Patient Instructions (Addendum)
Medication Instructions:  Continue gemfibrozil - you can cut tablets in half if easier to swallow but take same dose START vascepa 0.5mg  capsule - take 4 capsules by mouth twice daily If you need a refill on your cardiac medications before your next appointment, please call your pharmacy.   Lab work: FASTING lab work in 3 months to check cholesterol If you have labs (blood work) drawn today and your tests are completely normal, you will receive your results only by: Marland Kitchen MyChart Message (if you have MyChart) OR . A paper copy in the mail If you have any lab test that is abnormal or we need to change your treatment, we will call you to review the results.  Testing/Procedures: Dr. Debara Pickett has ordered a Myocardial Perfusion Imaging Study.   The test will take approximately 3 to 4 hours to complete; you may bring reading material.  If someone comes with you to your appointment, they will need to remain in the main lobby due to limited space in the testing area. You will need to hold the following medications prior to your stress test: beta-blockers (24 hours prior to test)   How to prepare for your Myocardial Perfusion Test:  Do not eat or drink 3 hours prior to your test, except you may have water.  Do not consume products containing caffeine (regular or decaffeinated) 12 hours prior to your test. (ex: coffee, chocolate, sodas, tea).  Do wear comfortable clothes (no dresses or overalls) and walking shoes, tennis shoes preferred (No heels or open toe shoes are allowed).  Do NOT wear cologne, perfume, aftershave, or lotions (deodorant is allowed).  If these instructions are not followed, your test will have to be rescheduled.   Follow-Up: At [facility], you and your health needs are our priority.  As part of our continuing mission to provide you with exceptional heart care, we have created designated Provider Care Teams.  These Care Teams include your primary Cardiologist (physician) and  Advanced Practice Providers (APPs -  Physician Assistants and Nurse Practitioners) who all work together to provide you with the care you need, when you need it. . You will need a follow up appointment in about 1 month with Dr. Debara Pickett (after stress test)  Any Other Special Instructions Will Be Listed Below (If Applicable).  You have been referred to Dr. Carol Ada (gastroentergoloist)

## 2018-05-29 ENCOUNTER — Ambulatory Visit: Payer: Medicare HMO | Admitting: Cardiology

## 2018-05-29 ENCOUNTER — Telehealth (HOSPITAL_COMMUNITY): Payer: Self-pay

## 2018-05-29 NOTE — Telephone Encounter (Signed)
Encounter complete. 

## 2018-06-03 ENCOUNTER — Ambulatory Visit (HOSPITAL_COMMUNITY)
Admission: RE | Admit: 2018-06-03 | Payer: Medicare HMO | Source: Ambulatory Visit | Attending: Internal Medicine | Admitting: Internal Medicine

## 2018-06-04 ENCOUNTER — Ambulatory Visit: Payer: Medicare HMO

## 2018-06-08 ENCOUNTER — Telehealth: Payer: Self-pay | Admitting: Internal Medicine

## 2018-06-08 DIAGNOSIS — E118 Type 2 diabetes mellitus with unspecified complications: Secondary | ICD-10-CM

## 2018-06-08 MED ORDER — GLUCOSE BLOOD VI STRP: ORAL_STRIP | 11 refills | Status: DC

## 2018-06-08 MED ORDER — ONETOUCH ULTRA MINI W/DEVICE KIT: PACK | 0 refills | Status: DC

## 2018-06-08 MED ORDER — ONETOUCH DELICA LANCETS 33G MISC: 11 refills | Status: DC

## 2018-06-08 NOTE — Telephone Encounter (Signed)
RX Sent, left vm for patient and notified her medicare only pays for her to test once daily since she is not dependent on insulin. I left my number for her to contact me with questions or if any issues arise.

## 2018-06-08 NOTE — Telephone Encounter (Signed)
1) Medication(s) Requested (by name): new meter   2) Pharmacy of Choice: [facility] [address]   3) Special Requests:   Approved medications will be sent to the pharmacy, we will reach out if there is an issue.  Requests made after 3pm may not be addressed until the following business day!  If a patient is unsure of the name of the medication(s) please note and ask patient to call back when they are able to provide all info, do not send to responsible party until all information is available!

## 2018-06-09 ENCOUNTER — Other Ambulatory Visit: Payer: Self-pay | Admitting: Internal Medicine

## 2018-06-09 DIAGNOSIS — E118 Type 2 diabetes mellitus with unspecified complications: Secondary | ICD-10-CM

## 2018-06-10 NOTE — Telephone Encounter (Signed)
New meter, strips and lancets were sent in on 06/08/18, left vm with patient and alt. contact asking for a call back to see if the new meter is broken or if this is still about the old meter.

## 2018-06-10 NOTE — Telephone Encounter (Signed)
Amy Glass could you please send to pharmacy

## 2018-06-11 ENCOUNTER — Telehealth: Payer: Self-pay | Admitting: Internal Medicine

## 2018-06-11 ENCOUNTER — Other Ambulatory Visit: Payer: Self-pay | Admitting: *Deleted

## 2018-06-11 MED ORDER — ICOSAPENT ETHYL 0.5 G PO CAPS: 4.0000 | ORAL_CAPSULE | Freq: Two times a day (BID) | ORAL | 11 refills | Status: DC

## 2018-06-11 NOTE — Telephone Encounter (Signed)
Agree - needs stress testing. If symptoms persist longer and do not resolve, recommend ER visit.  Dr. Lemmie Evens

## 2018-06-11 NOTE — Telephone Encounter (Signed)
New message   Pt c/o of Chest Pain: STAT if CP now or developed within 24 hours  1. Are you having CP right now? yes  2. Are you experiencing any other symptoms (ex. SOB, nausea, vomiting, sweating)? Sweating, pain runs down both arms and neck   3. How long have you been experiencing CP? Past 3 months  4. Is your CP continuous or coming and going? Coming and going   5. Have you taken Nitroglycerin? No  ?

## 2018-06-11 NOTE — Telephone Encounter (Signed)
Spoke with patient and she had episode of chest pain last night radiating to arms and neck with diaphoresis. She has had these episodes multiple times and they only last 2-3 minutes. She goes to the gym but does not occur with exertion just randomly. Advised patient if this happens with no relief to call 911. She will proceed with stress test as ordered, message sent to scheduling to see about getting sooner. Will forward to Dr Debara Pickett for review.

## 2018-06-12 ENCOUNTER — Telehealth (HOSPITAL_COMMUNITY): Payer: Self-pay

## 2018-06-12 NOTE — Telephone Encounter (Signed)
Encounter complete. 

## 2018-06-12 NOTE — Telephone Encounter (Signed)
Patient has been r/s for earlier stress test 06/17/18

## 2018-06-16 ENCOUNTER — Other Ambulatory Visit: Payer: Self-pay

## 2018-06-16 DIAGNOSIS — E118 Type 2 diabetes mellitus with unspecified complications: Secondary | ICD-10-CM

## 2018-06-16 MED ORDER — GLUCOSE BLOOD VI STRP: ORAL_STRIP | 12 refills | Status: DC

## 2018-06-16 MED ORDER — ACCU-CHEK FASTCLIX LANCETS MISC: 12 refills | Status: DC

## 2018-06-16 MED ORDER — ACCU-CHEK GUIDE W/DEVICE KIT: 1.0000 | PACK | Freq: Every day | 0 refills | Status: DC

## 2018-06-16 NOTE — Telephone Encounter (Signed)
Per pharmacy request,  rx for diabetic testing supplies was switched to [facility]

## 2018-06-17 ENCOUNTER — Ambulatory Visit (HOSPITAL_COMMUNITY)
Admission: RE | Admit: 2018-06-17 | Discharge: 2018-06-17 | Disposition: A | Discharge status: Home / Self Care | Payer: Medicare HMO | Source: Ambulatory Visit | Attending: Cardiology | Admitting: Cardiology

## 2018-06-17 DIAGNOSIS — I10 Essential (primary) hypertension: Secondary | ICD-10-CM | POA: Insufficient documentation

## 2018-06-17 DIAGNOSIS — R079 Chest pain, unspecified: Secondary | ICD-10-CM

## 2018-06-17 DIAGNOSIS — E785 Hyperlipidemia, unspecified: Secondary | ICD-10-CM | POA: Insufficient documentation

## 2018-06-17 DIAGNOSIS — E781 Pure hyperglyceridemia: Secondary | ICD-10-CM | POA: Diagnosis not present

## 2018-06-17 LAB — MYOCARDIAL PERFUSION IMAGING
CHL CUP NUCLEAR SDS: 3
CHL CUP NUCLEAR SRS: 1
CHL CUP NUCLEAR SSS: 4
CSEPPHR: 115 {beats}/min
LV dias vol: 54 mL (ref 46–106)
LV sys vol: 21 mL
Rest HR: 104 {beats}/min
TID: 1.02

## 2018-06-17 MED ORDER — TECHNETIUM TC 99M TETROFOSMIN IV KIT
10.1000 | PACK | Freq: Once | INTRAVENOUS | Status: AC | PRN
  Administered 2018-06-17: 10.1 via INTRAVENOUS
  Filled 2018-06-17: qty 11

## 2018-06-17 MED ORDER — TECHNETIUM TC 99M TETROFOSMIN IV KIT
29.9000 | PACK | Freq: Once | INTRAVENOUS | Status: AC | PRN
  Administered 2018-06-17: 29.9 via INTRAVENOUS
  Filled 2018-06-17: qty 30

## 2018-06-17 MED ORDER — REGADENOSON 0.4 MG/5ML IV SOLN
0.4000 mg | Freq: Once | INTRAVENOUS | Status: AC
  Administered 2018-06-17: 0.4 mg via INTRAVENOUS

## 2018-06-22 ENCOUNTER — Encounter (HOSPITAL_COMMUNITY): Payer: Medicare HMO

## 2018-06-23 ENCOUNTER — Ambulatory Visit: Payer: Medicare HMO | Admitting: Nurse Practitioner

## 2018-06-24 DIAGNOSIS — G4700 Insomnia, unspecified: Secondary | ICD-10-CM | POA: Diagnosis not present

## 2018-06-24 DIAGNOSIS — M5416 Radiculopathy, lumbar region: Secondary | ICD-10-CM | POA: Diagnosis not present

## 2018-06-24 DIAGNOSIS — G894 Chronic pain syndrome: Secondary | ICD-10-CM | POA: Diagnosis not present

## 2018-06-24 DIAGNOSIS — M545 Low back pain: Secondary | ICD-10-CM | POA: Diagnosis not present

## 2018-06-24 DIAGNOSIS — M25519 Pain in unspecified shoulder: Secondary | ICD-10-CM | POA: Diagnosis not present

## 2018-06-24 DIAGNOSIS — Z79891 Long term (current) use of opiate analgesic: Secondary | ICD-10-CM | POA: Diagnosis not present

## 2018-07-02 DIAGNOSIS — F902 Attention-deficit hyperactivity disorder, combined type: Secondary | ICD-10-CM | POA: Diagnosis not present

## 2018-07-02 DIAGNOSIS — F411 Generalized anxiety disorder: Secondary | ICD-10-CM | POA: Diagnosis not present

## 2018-07-02 DIAGNOSIS — F3132 Bipolar disorder, current episode depressed, moderate: Secondary | ICD-10-CM | POA: Diagnosis not present

## 2018-07-03 ENCOUNTER — Encounter: Payer: Self-pay | Admitting: Internal Medicine

## 2018-07-03 ENCOUNTER — Ambulatory Visit: Payer: Medicare HMO | Admitting: Internal Medicine

## 2018-07-07 ENCOUNTER — Ambulatory Visit: Payer: Medicare HMO | Admitting: Nurse Practitioner

## 2018-07-07 ENCOUNTER — Encounter: Payer: Self-pay | Admitting: Nurse Practitioner

## 2018-07-07 VITALS — BP 138/100 | HR 68 | Ht 59.0 in | Wt 147.0 lb

## 2018-07-07 DIAGNOSIS — R1310 Dysphagia, unspecified: Secondary | ICD-10-CM | POA: Diagnosis not present

## 2018-07-07 DIAGNOSIS — K219 Gastro-esophageal reflux disease without esophagitis: Secondary | ICD-10-CM | POA: Diagnosis not present

## 2018-07-07 MED ORDER — FAMOTIDINE 20 MG PO TABS: 20.0000 mg | ORAL_TABLET | Freq: Every day | ORAL | 5 refills | Status: DC

## 2018-07-07 MED ORDER — OMEPRAZOLE 40 MG PO CPDR: 40.0000 mg | DELAYED_RELEASE_CAPSULE | ORAL | 5 refills | Status: DC

## 2018-07-07 NOTE — Progress Notes (Addendum)
Chief Complaint:    Chest pain and problems swallowing again  IMPRESSION and PLAN:     57 year old female with history of mild reflux esophagitis / 2 cm hiatal hernia / Schatzki's ring.  She has chronic intermittent dysphagia.  Normal esophageal manometry in 2017 . Her last EGD 2 years ago was normal, she was empirically balloon dilated.  Here with intermiitent chest pain radiating up into her throat and down both arms, says predominantly left arm.  She has been evaluated by cardiology, very recently had low risk myocardial perfusion study.  She is having intermittent solid food dysphagia as well.  -Turns out, patient stopped PPI year ago.  She made dietary changes at the time, her acid regurgitation resolved so she stopped the medication.  Though she is not regurgitating, her symptoms could still very well be related to GERD.  Before further work-up she needs to be back on GERD regimen.  -I have asked her to try omeprazole 40 mg every morning 30 minutes before breakfast.  Patient concerned her insurance may not pay for twice daily dosing so for now she will add in the Pepcid 20 mg at bedtime.  Patient goes to bed on an empty stomach but the head of her bed is not elevated.  I have asked her to check into getting a wedge pillow -Patient will call me back in 3 to 4 weeks with a condition update.  Further recommendations pending above  Addendum: Reviewed and agree with assessment and management plan. Pyrtle, Lajuan Lines, MD    HPI:     Patient is a 57 year old female with diabetes, hypertension, hypertriglyceridemia .  She is known to Dr. Hilarie Fredrickson.  She has a history of adenomatous colon polyps , GERD/reflux esophagitis/Schatzki's ring. We saw her in 2017 for evaluation of dysphagia.  She had an normal esophageal manometry in April 2017. Patient was scheduled for repeat EGD which she had December 2017 with findings of a 2 cm hiatal hernia, normal esophagus.  She was dilated empirically with  balloon dilator up to 18 mm  Patient was recently seen by Cardiology, underwent myocardial perfusion study.  No ischemia, low risk test.  Her LV function was normal.   Patient comes in for evaluation of ongoing chest pain.  Pain is random, occurs about once a week and she does not know what causes the pain.  Pain starts in her chest, goes up into her throat and down both arms especially her left arm.  Often feels like something is squeezing her left arm.  A year or so ago patient made some major dietary changes resulting in resolution of acid regurgitation.  She subsequently stopped PPI therapy, has not had it in a year.  Because of the lack of regurgitation patient has not thought that her chest pain was related to GERD .  She also continues to have intermittent solid food dysphagia.  She inquires about hiatal hernia found on EGD.  She is concerned about the hiatal hernia, she is concerned about any damage that may be to her esophagus   Review of systems:     no SOB, no fevers, no urinary sx   Past Medical History:  Diagnosis Date  . Allergy   . Anxiety    as child   . Depression    as child  . Diabetes mellitus without complication ([name]) Dx 3832  . Diverticulosis   . Hiatal hernia   . Hyperplastic colon polyp   .  Hypertension Dx 2003  . Schatzki's ring     Patient's surgical history, family medical history, social history, medications and allergies were all reviewed in Epic   Creatinine clearance cannot be calculated (Patient's most recent lab result is older than the maximum 21 days allowed.)  Current Outpatient Medications  Medication Sig Dispense Refill  . ACCU-CHEK FASTCLIX LANCETS MISC Use as directed to test blood sugar once daily 100 each 12  . Blood Glucose Monitoring Suppl (ACCU-CHEK GUIDE) w/Device KIT 1 each by Does not apply route daily. Use as directed to test blood sugar once daily 1 kit 0  . fluticasone (FLONASE) 50 MCG/ACT nasal spray Place 1 spray into both nostrils  daily. 16 g 2  . gemfibrozil (LOPID) 600 MG tablet Take 1 tablet (600 mg total) by mouth 2 (two) times daily before a meal. 60 tablet 2  . glucose blood (ACCU-CHEK GUIDE) test strip Use as directed to test blood sugar once daily 100 each 12  . HYDROcodone-acetaminophen (NORCO) 10-325 MG tablet Take 1 tablet by mouth every 8 (eight) hours as needed.    [name] (VASCEPA) 0.5 g CAPS Take 4 capsules by mouth 2 (two) times daily. 240 capsule 11  . ketotifen (ZADITOR) 0.025 % ophthalmic solution Place 1 drop into both eyes 2 (two) times daily. (Patient taking differently: Place 1 drop into both eyes 2 (two) times daily as needed (allergies). ) 10 mL 1  . lisinopril (PRINIVIL,ZESTRIL) 20 MG tablet Take 1 tablet (20 mg total) by mouth daily. (Patient taking differently: Take 40 mg by mouth daily. ) 30 tablet 6  . sitaGLIPtin-metformin (JANUMET) 50-500 MG tablet Take 1 tablet by mouth 2 (two) times daily with a meal. 60 tablet 6  . traZODone (DESYREL) 50 MG tablet Take 1 tablet (50 mg total) by mouth at bedtime. 30 tablet 5  . venlafaxine XR (EFFEXOR-XR) 150 MG 24 hr capsule TAKE 1 CAPSULE BY MOUTH EVERY DAY WITH BREAKFAST 30 capsule 2   No current facility-administered medications for this visit.     Physical Exam:     BP (!) 138/100   Pulse 68   Ht '4\' 11"'  (1.499 m)   Wt 147 lb (66.7 kg)   LMP 05/30/2015   BMI 29.69 kg/m   GENERAL:  Pleasant female in NAD PSYCH: : Cooperative, normal affect EENT:  conjunctiva pink, mucous membranes moist, neck supple without masses CARDIAC:  RRR, no murmur heard, no peripheral edema PULM: Normal respiratory effort, lungs CTA bilaterally, no wheezing ABDOMEN:  Nondistended, soft, nontender. No obvious masses, no hepatomegaly,  normal bowel sounds SKIN:  turgor, no lesions seen Musculoskeletal:  Normal muscle tone, normal strength NEURO: Alert and oriented x 3, no focal neurologic deficits   Tye Savoy , NP 07/07/2018, 9:17 AM

## 2018-07-07 NOTE — Patient Instructions (Signed)
If you are age 56 or older, your body mass index should be between 23-30. Your Body mass index is 29.69 kg/m. If this is out of the aforementioned range listed, please consider follow up with your Primary Care Provider.  If you are age 43 or younger, your body mass index should be between 19-25. Your Body mass index is 29.69 kg/m. If this is out of the aformentioned range listed, please consider follow up with your Primary Care Provider.   We have sent the following medications to your pharmacy for you to pick up at your convenience: [facility]  Call in 3-4 weeks with an update.  Thank you for choosing me and [facility].   Tye Savoy, NP

## 2018-07-17 MED FILL — JANUMET 50-500 MG TABLET: 50-500 | 30 days supply | Qty: 60 | Fill #1

## 2018-07-22 DIAGNOSIS — U071 COVID-19: Secondary | ICD-10-CM

## 2018-07-22 HISTORY — DX: COVID-19: U07.1

## 2018-07-29 DIAGNOSIS — F3132 Bipolar disorder, current episode depressed, moderate: Secondary | ICD-10-CM | POA: Diagnosis not present

## 2018-07-29 DIAGNOSIS — F411 Generalized anxiety disorder: Secondary | ICD-10-CM | POA: Diagnosis not present

## 2018-07-29 DIAGNOSIS — F902 Attention-deficit hyperactivity disorder, combined type: Secondary | ICD-10-CM | POA: Diagnosis not present

## 2018-08-05 ENCOUNTER — Other Ambulatory Visit: Payer: Self-pay | Admitting: Internal Medicine

## 2018-08-05 DIAGNOSIS — F329 Major depressive disorder, single episode, unspecified: Secondary | ICD-10-CM

## 2018-08-05 DIAGNOSIS — F32A Depression, unspecified: Secondary | ICD-10-CM

## 2018-08-20 DIAGNOSIS — F3132 Bipolar disorder, current episode depressed, moderate: Secondary | ICD-10-CM | POA: Diagnosis not present

## 2018-08-25 ENCOUNTER — Other Ambulatory Visit: Payer: Self-pay | Admitting: Physician Assistant

## 2018-08-25 DIAGNOSIS — E118 Type 2 diabetes mellitus with unspecified complications: Secondary | ICD-10-CM

## 2018-08-26 ENCOUNTER — Ambulatory Visit: Payer: Medicare HMO | Admitting: Internal Medicine

## 2018-08-26 DIAGNOSIS — M5416 Radiculopathy, lumbar region: Secondary | ICD-10-CM | POA: Diagnosis not present

## 2018-08-26 DIAGNOSIS — M545 Low back pain: Secondary | ICD-10-CM | POA: Diagnosis not present

## 2018-08-26 DIAGNOSIS — G894 Chronic pain syndrome: Secondary | ICD-10-CM | POA: Diagnosis not present

## 2018-08-26 DIAGNOSIS — G4700 Insomnia, unspecified: Secondary | ICD-10-CM | POA: Diagnosis not present

## 2018-08-26 DIAGNOSIS — Z79891 Long term (current) use of opiate analgesic: Secondary | ICD-10-CM | POA: Diagnosis not present

## 2018-08-26 DIAGNOSIS — M25519 Pain in unspecified shoulder: Secondary | ICD-10-CM | POA: Diagnosis not present

## 2018-08-27 ENCOUNTER — Encounter: Payer: Self-pay | Admitting: *Deleted

## 2018-09-08 DIAGNOSIS — F3132 Bipolar disorder, current episode depressed, moderate: Secondary | ICD-10-CM | POA: Diagnosis not present

## 2018-09-08 DIAGNOSIS — F1020 Alcohol dependence, uncomplicated: Secondary | ICD-10-CM | POA: Diagnosis not present

## 2018-09-08 DIAGNOSIS — F1110 Opioid abuse, uncomplicated: Secondary | ICD-10-CM | POA: Diagnosis not present

## 2018-09-08 DIAGNOSIS — F1220 Cannabis dependence, uncomplicated: Secondary | ICD-10-CM | POA: Diagnosis not present

## 2018-09-08 DIAGNOSIS — F902 Attention-deficit hyperactivity disorder, combined type: Secondary | ICD-10-CM | POA: Diagnosis not present

## 2018-09-08 DIAGNOSIS — F411 Generalized anxiety disorder: Secondary | ICD-10-CM | POA: Diagnosis not present

## 2018-09-18 ENCOUNTER — Other Ambulatory Visit: Payer: Self-pay | Admitting: Physician Assistant

## 2018-09-18 DIAGNOSIS — E118 Type 2 diabetes mellitus with unspecified complications: Secondary | ICD-10-CM

## 2018-09-22 ENCOUNTER — Telehealth: Payer: Self-pay | Admitting: Internal Medicine

## 2018-09-22 DIAGNOSIS — F3132 Bipolar disorder, current episode depressed, moderate: Secondary | ICD-10-CM | POA: Diagnosis not present

## 2018-09-22 NOTE — Telephone Encounter (Signed)
New Message  1) Medication(s) Requested (by name): sitaGLIPtin-metformin (JANUMET) 50-500 MG tablet  2) Pharmacy of Choice: [facility] on [address].  3) Special Requests:   Approved medications will be sent to the pharmacy, we will reach out if there is an issue.  Requests made after 3pm may not be addressed until the following business day!  If a patient is unsure of the name of the medication(s) please note and ask patient to call back when they are able to provide all info, do not send to responsible party until all information is available!

## 2018-09-23 NOTE — Telephone Encounter (Signed)
Pt had refills available at the pharmacy, it was submitted and will be ready for pickup later today.

## 2018-09-29 ENCOUNTER — Other Ambulatory Visit: Payer: Self-pay | Admitting: Physician Assistant

## 2018-09-29 DIAGNOSIS — E118 Type 2 diabetes mellitus with unspecified complications: Secondary | ICD-10-CM

## 2018-10-14 MED FILL — JANUMET 50-500 MG TABLET: 50-500 | 30 days supply | Qty: 60 | Fill #2

## 2018-10-21 ENCOUNTER — Telehealth: Payer: Self-pay | Admitting: *Deleted

## 2018-10-21 ENCOUNTER — Ambulatory Visit: Payer: Medicare HMO | Admitting: Family Medicine

## 2018-10-21 NOTE — Telephone Encounter (Signed)
Medical Assistant used [employer] to contact patient.  Interpreter Name: Interpreter #: [phone]

## 2018-10-22 ENCOUNTER — Ambulatory Visit: Payer: Medicare HMO | Attending: Family Medicine | Admitting: Family Medicine

## 2018-10-22 ENCOUNTER — Other Ambulatory Visit: Payer: Self-pay

## 2018-10-22 ENCOUNTER — Encounter: Payer: Self-pay | Admitting: Family Medicine

## 2018-10-22 DIAGNOSIS — R059 Cough, unspecified: Secondary | ICD-10-CM

## 2018-10-22 DIAGNOSIS — R05 Cough: Secondary | ICD-10-CM

## 2018-10-22 DIAGNOSIS — E781 Pure hyperglyceridemia: Secondary | ICD-10-CM

## 2018-10-22 DIAGNOSIS — E1165 Type 2 diabetes mellitus with hyperglycemia: Secondary | ICD-10-CM

## 2018-10-22 DIAGNOSIS — Z79899 Other long term (current) drug therapy: Secondary | ICD-10-CM | POA: Diagnosis not present

## 2018-10-22 DIAGNOSIS — I10 Essential (primary) hypertension: Secondary | ICD-10-CM | POA: Diagnosis not present

## 2018-10-22 MED ORDER — LOSARTAN POTASSIUM 100 MG PO TABS: 100.0000 mg | ORAL_TABLET | Freq: Every day | ORAL | 3 refills | Status: DC

## 2018-10-22 NOTE — Progress Notes (Signed)
Virtual Visit via Telephone Note  I connected with Amy Glass on 10/22/18 at  8:50 AM EDT by telephone and verified that I am speaking with the correct person using two identifiers.  Due to current restrictions/limitiation of in-office visits due to COVID-19 pandemic, this scheduled clinical appointment was converted to a tele-health visit   I discussed the limitations, risks, security and privacy concerns of performing an evaluation and management service by telephone and the availability of in person appointments. I also discussed with the patient that there may be a patient responsible charge related to this service. The patient expressed understanding and agreed to proceed.   History of Present Illness: 58 year old female with ongoing medical conditions including type 2 diabetes, hypertension and hypertriglyceridemia who is being seen for continued medical management.  She reports that earlier in the month she was completely out of her Janumet and at that time her blood sugars were in the 500s.  She has received her Janumet within the past week and now her blood sugars are in the 180s to 170s.  Patient denies any current issues with urinary frequency, urgency and no increased thirst.  Patient denies any blurred vision but admits that she has not had a recent diabetic eye exam.  Patient denies any numbness or tingling in her hands or feet.      She reports that she continues to take lisinopril for her blood pressure control and she feels that this medication has worked well.  She however has had issues with a dry cough and she would be willing to try a different blood pressure medication to see if her cough resolves.  She denies any headaches or dizziness related to her blood pressure.  She has had no issues with GI upset or muscle aches related to her use of gemfibrozil for the treatment of her lipids.  Overall, she feels well at this time.  Past Medical History:  Diagnosis Date  .  Allergy   . Anxiety    as child   . Depression    as child  . Diabetes mellitus without complication ([place]) Dx 6073  . Diverticulosis   . Hiatal hernia   . Hyperplastic colon polyp   . Hypertension Dx 2003  . Schatzki's ring    Past Surgical History:  Procedure Laterality Date  . BACK SURGERY  2002   lumbar  . BROW LIFT Bilateral 09/17/2016   Procedure: BLEPHAROPLASTY;  Surgeon: Irene Limbo, MD;  Location: [facility];  Service: Plastics;  Laterality: Bilateral;  . CARPAL TUNNEL RELEASE Right   . CARPAL TUNNEL RELEASE Left 06/2016  . CESAREAN SECTION  05/20/2003   . ESOPHAGEAL MANOMETRY N/A 10/25/2015   Procedure: ESOPHAGEAL MANOMETRY (EM);  Surgeon: Jerene Bears, MD;  Location: [facility];  Service: Gastroenterology;  Laterality: N/A;  . PTOSIS REPAIR Bilateral 09/17/2016   Procedure: BILATERAL PTOSIS REPAIR EYELID WITH SUTURE TECHNIQUE, BILATERAL UPPER LID BLEPHAROPLASTY WITH EXCESS SKIN WEIGHING EYELID DOWN.;  Surgeon: Irene Limbo, MD;  Location: [facility];  Service: Plastics;  Laterality: Bilateral;  . SHOULDER SURGERY  08/2013    b/l shoulder   . SHOULDER SURGERY Right 11/2015   Family History  Problem Relation Age of Onset  . Diabetes Mother   . Heart disease Mother   . Hyperlipidemia Mother   . Hypertension Mother   . Renal cancer Maternal Aunt   . Stomach cancer Maternal Uncle   . Colon cancer Neg Hx   . Esophageal cancer  Neg Hx   . Rectal cancer Neg Hx    Family History  Problem Relation Age of Onset  . Diabetes Mother   . Heart disease Mother   . Hyperlipidemia Mother   . Hypertension Mother   . Renal cancer Maternal Aunt   . Stomach cancer Maternal Uncle   . Colon cancer Neg Hx   . Esophageal cancer Neg Hx   . Rectal cancer Neg Hx    Social History   Tobacco Use  . Smoking status: Never Smoker  . Smokeless tobacco: Never Used  Substance Use Topics  . Alcohol use: No    Alcohol/week: 0.0 standard drinks  . Drug  use: No   Allergies  Allergen Reactions  . Nitroglycerin Other (See Comments)    Migraines, (only tried nitro patch. Has never tried the pills)  . Tricor [Fenofibrate]     Stomach upset   Review of Systems  Constitutional: Negative for chills, fever and malaise/fatigue.  HENT: Negative for congestion and sore throat.   Eyes: Negative for blurred vision and double vision.  Respiratory: Positive for cough. Negative for shortness of breath and wheezing.   Cardiovascular: Negative for chest pain and palpitations.  Gastrointestinal: Negative for abdominal pain, constipation, diarrhea, heartburn and nausea.  Genitourinary: Negative for dysuria, frequency and urgency.  Musculoskeletal: Negative for joint pain and myalgias.  Neurological: Negative for dizziness and headaches.  Endo/Heme/Allergies: Negative for polydipsia. Does not bruise/bleed easily.     Observations/Objective: No vital signs obtained and no physical exam done as visit was conducted by telephone   Assessment and Plan: 1. Controlled type 2 diabetes mellitus with hyperglycemia, without long-term current use of insulin ([place]) Patient's most recent Hgb A1c in August of 2019 was 7.6.  Discussed hemoglobin A1c goal of 7 or less.  Patient will remain on her current medications for the treatment of diabetes.  Patient was instructed to make sure that she is following a low carbohydrate diet and exercising on a regular basis.  Patient agrees to return to clinic for hemoglobin A1c, CMP within the next 2 weeks and she will be further instructed regarding medications for control of diabetes based on these labs.  Patient will also be referred to ophthalmology for diabetic eye exam.  Patient made aware that her ophthalmology visit will likely not occur until May or June due to the current COVID-19.  Her hemoglobin A1c unfortunately may be slightly skewed as patient states that she was out of her Janumet for about 2 weeks in March and her blood  sugars were as high as 500 at that time but have now returned to a more reasonable level - Comprehensive metabolic panel; Future - Hemoglobin A1c; Future - Ambulatory referral to Ophthalmology  2. Essential hypertension Patient with complaint of a cough and new prescription will be sent to her pharmacy losartan 100 mg once daily in place of her current lisinopril 40 mg.  Patient is encouraged to have nurse visit blood pressure recheck in 1 to 2 weeks after starting losartan. - losartan (COZAAR) 100 MG tablet; Take 1 tablet (100 mg total) by mouth daily.  Dispense: 90 tablet; Refill: 3  3. Cough Patient with complaint of nonproductive cough which she does not believe is related to her allergies.  Patient is currently on lisinopril which can be associated with dry cough and patient agrees to try losartan instead of the current lisinopril.  Patient is advised to contact the office if she continues to have cough after switching blood  pressure medications.  4. Hypertriglyceridemia Patient with hypertriglyceridemia which has been longstanding and her most recent lipid panel in October 2019 showed triglyceride level of 2584.  I discussed with the patient that goal triglyceride level is 150 or less and that LDL cannot be calculated due to high triglyceride level.  Patient is currently on Lopid.  Patient is also encouraged to continue a low-fat diet and patient will have fasting lipid panel when she comes to the clinic in the next 1 to 2 weeks for blood work.  Patient may benefit from Villarreal in place of gemfibrozil and may need to have statin added to medication for triglycerides. - Lipid panel; Future  5.  Long-term use of high-risk medication Patient will schedule lab visit for CMP in follow-up of long-term use of high-risk medications including patient's current use of lisinopril which is being switched to losartan, Janumet and gemfibrozil.  Follow Up Instructions: Patient will return for lab visit in  the next 1 to 2 weeks as well as 58-month office follow-up of diabetes and return as needed    I discussed the assessment and treatment plan with the patient. The patient was provided an opportunity to ask questions and all were answered. The patient agreed with the plan and demonstrated an understanding of the instructions.   The patient was advised to call back or seek an in-person evaluation if the symptoms worsen or if the condition fails to improve as anticipated.  I provided 13 minutes of non-face-to-face time during this encounter.   Antony Blackbird, MD

## 2018-10-22 NOTE — Progress Notes (Signed)
Patient verified DOB Patient has taken [place] today. Patient has not eaten today. Patient denies pain at this time. Patient states allergies are in the house but denies any SOB, cough,aches. Patients current CBG is 177 Patient received her medication in the mail.

## 2018-11-05 DIAGNOSIS — F3132 Bipolar disorder, current episode depressed, moderate: Secondary | ICD-10-CM | POA: Diagnosis not present

## 2018-11-05 DIAGNOSIS — F411 Generalized anxiety disorder: Secondary | ICD-10-CM | POA: Diagnosis not present

## 2018-11-05 DIAGNOSIS — F902 Attention-deficit hyperactivity disorder, combined type: Secondary | ICD-10-CM | POA: Diagnosis not present

## 2018-11-06 ENCOUNTER — Other Ambulatory Visit: Payer: Self-pay | Admitting: Internal Medicine

## 2018-11-06 DIAGNOSIS — F32A Depression, unspecified: Secondary | ICD-10-CM

## 2018-11-06 DIAGNOSIS — F329 Major depressive disorder, single episode, unspecified: Secondary | ICD-10-CM

## 2018-11-11 DIAGNOSIS — F3132 Bipolar disorder, current episode depressed, moderate: Secondary | ICD-10-CM | POA: Diagnosis not present

## 2018-11-24 MED FILL — JANUMET 50-500 MG TABLET: 50-500 | 30 days supply | Qty: 60 | Fill #3

## 2018-11-25 DIAGNOSIS — F3132 Bipolar disorder, current episode depressed, moderate: Secondary | ICD-10-CM | POA: Diagnosis not present

## 2018-11-28 ENCOUNTER — Other Ambulatory Visit: Payer: Self-pay | Admitting: Internal Medicine

## 2018-11-28 DIAGNOSIS — F32A Depression, unspecified: Secondary | ICD-10-CM

## 2018-11-28 DIAGNOSIS — F329 Major depressive disorder, single episode, unspecified: Secondary | ICD-10-CM

## 2018-12-09 DIAGNOSIS — F3132 Bipolar disorder, current episode depressed, moderate: Secondary | ICD-10-CM | POA: Diagnosis not present

## 2018-12-23 DIAGNOSIS — F3132 Bipolar disorder, current episode depressed, moderate: Secondary | ICD-10-CM | POA: Diagnosis not present

## 2018-12-26 ENCOUNTER — Other Ambulatory Visit: Payer: Self-pay | Admitting: Internal Medicine

## 2018-12-26 DIAGNOSIS — F329 Major depressive disorder, single episode, unspecified: Secondary | ICD-10-CM

## 2018-12-26 DIAGNOSIS — F32A Depression, unspecified: Secondary | ICD-10-CM

## 2019-01-06 DIAGNOSIS — F3132 Bipolar disorder, current episode depressed, moderate: Secondary | ICD-10-CM | POA: Diagnosis not present

## 2019-01-06 MED FILL — JANUMET 50-500 MG TABLET: 50-500 | 30 days supply | Qty: 60 | Fill #4

## 2019-01-07 ENCOUNTER — Other Ambulatory Visit: Payer: Self-pay | Admitting: Internal Medicine

## 2019-01-07 ENCOUNTER — Telehealth: Payer: Self-pay | Admitting: Internal Medicine

## 2019-01-07 DIAGNOSIS — F902 Attention-deficit hyperactivity disorder, combined type: Secondary | ICD-10-CM | POA: Diagnosis not present

## 2019-01-07 DIAGNOSIS — F329 Major depressive disorder, single episode, unspecified: Secondary | ICD-10-CM

## 2019-01-07 DIAGNOSIS — F3181 Bipolar II disorder: Secondary | ICD-10-CM | POA: Diagnosis not present

## 2019-01-07 DIAGNOSIS — F411 Generalized anxiety disorder: Secondary | ICD-10-CM | POA: Diagnosis not present

## 2019-01-07 DIAGNOSIS — F32A Depression, unspecified: Secondary | ICD-10-CM

## 2019-01-07 NOTE — Telephone Encounter (Signed)
Pt would like to know about a bone density test..please follow up

## 2019-01-08 ENCOUNTER — Other Ambulatory Visit: Payer: Medicare HMO

## 2019-01-27 DIAGNOSIS — F3132 Bipolar disorder, current episode depressed, moderate: Secondary | ICD-10-CM | POA: Diagnosis not present

## 2019-01-28 ENCOUNTER — Encounter: Payer: Self-pay | Admitting: Internal Medicine

## 2019-01-28 ENCOUNTER — Other Ambulatory Visit: Payer: Self-pay

## 2019-01-28 ENCOUNTER — Ambulatory Visit: Payer: Medicare HMO | Attending: Internal Medicine | Admitting: Internal Medicine

## 2019-01-28 VITALS — BP 129/86 | HR 93 | Temp 98.2°F | Resp 18 | Ht <= 58 in | Wt 148.0 lb

## 2019-01-28 DIAGNOSIS — M26622 Arthralgia of left temporomandibular joint: Secondary | ICD-10-CM

## 2019-01-28 DIAGNOSIS — R220 Localized swelling, mass and lump, head: Secondary | ICD-10-CM | POA: Diagnosis not present

## 2019-01-28 DIAGNOSIS — I10 Essential (primary) hypertension: Secondary | ICD-10-CM | POA: Diagnosis not present

## 2019-01-28 DIAGNOSIS — E1165 Type 2 diabetes mellitus with hyperglycemia: Secondary | ICD-10-CM | POA: Diagnosis not present

## 2019-01-28 DIAGNOSIS — IMO0001 Reserved for inherently not codable concepts without codable children: Secondary | ICD-10-CM

## 2019-01-28 DIAGNOSIS — E781 Pure hyperglyceridemia: Secondary | ICD-10-CM

## 2019-01-28 DIAGNOSIS — H6123 Impacted cerumen, bilateral: Secondary | ICD-10-CM | POA: Diagnosis not present

## 2019-01-28 LAB — HEMOGLOBIN A1C: Hemoglobin A1C: 7.2 % — AB (ref 4.0–5.6)

## 2019-01-28 MED ORDER — LOSARTAN POTASSIUM 100 MG PO TABS: 100.0000 mg | ORAL_TABLET | Freq: Every day | ORAL | 6 refills | Status: DC

## 2019-01-28 MED ORDER — JANUMET 50-500 MG PO TABS: 1.0000 | ORAL_TABLET | Freq: Two times a day (BID) | ORAL | 11 refills | Status: DC

## 2019-01-28 NOTE — Progress Notes (Signed)
Patient ID: Rhanda Lemire, female    DOB: [birth year]  MRN: [id number]  CC: Facial Swelling   Subjective: Yarelin Reichardt is a 58 y.o. female who presents for chronic ds management Her concerns today include:  Patient with history of DM, HTN, hyperTG, depression/anxiety, chronic neck pain, elevated TG  Pt c/o swelling over LT jaw x 2 wks.  Persisent swelling.  No redness Associated with pain over this area.  Feels pain in jaw but not a tooth ache When she opens mouth and tries to eat she hears a cracking sound on LT side She can barely open her mouth to insert food because it hurts.  It also hurts to chew. Put Peroxide filled gauze in mouth 2 x and takes Motrin PRN.  Both helps. Prior to this started, she had a little pain in LT ear.   No sore throat No fever She is concerned that she may have cancer and want blood test done to check her for cancer.  DM:  Taking Janumet BID not once a day as indicated on med list. Eating Habits:  "sometimes I get cravings.  This past wk it was apple pie and chocolate."  Drinks water.  Avoids juices. Exercise: She states that she stays active by cleaning her house and gardening Eye exam:  Over due for exam.  Agreeable to referral  HTN:  No device to check home blood pressure Lisinopril changed to Cozaar due to cough.  She is tolerating Cozaar well.  She limits salt in foods.  Denies any chest pains or shortness of breath.  No lower extremity edema.  TG:  Not taking Lopid or Vascepa.  Afraid to take because she is afraid they may affect her liver and "I changed my diet."  States that she is cut back on eating beef and fried foods.  HM: She is due for diabetic eye exam and Pap smear.  She wonders whether she needs a bone density.  She is requesting a refill on hydrocodone which she used to get from her pain specialist for back or neck pain.  She is asking for short supply until she can get back in with the specialist. -I did not discover this  until after the patient had left but she did not disclose to me that she is on Adderall and Lunesta through another provider.  Patient Active Problem List   Diagnosis Date Noted  . Chest pain 05/28/2018  . Dysphagia 05/28/2018  . HTN (hypertension) 12/26/2016  . Depression 02/08/2016  . Rosacea 02/08/2016  . Acanthosis nigricans 09/27/2015  . TMJ pain dysfunction syndrome 01/16/2015  . Poor dentition 01/16/2015  . Lumbar degenerative disc disease 01/10/2015  . Vitamin D deficiency 12/05/2014  . Hypertriglyceridemia 12/05/2014  . Diabetes type 2, controlled ([place]) 12/02/2014     Current Outpatient Medications on File Prior to Visit  Medication Sig Dispense Refill  . ACCU-CHEK FASTCLIX LANCETS MISC Use as directed to test blood sugar once daily 100 each 12  . amphetamine-dextroamphetamine (ADDERALL) 15 MG tablet TAKE 1 TABLET BY MOUTH TWO TIMES A DAY FOR ADD    . Blood Glucose Monitoring Suppl (ACCU-CHEK GUIDE) w/Device KIT 1 each by Does not apply route daily. Use as directed to test blood sugar once daily 1 kit 0  . eszopiclone (LUNESTA) 2 MG TABS tablet 1 TABLET BY MOUTH EVERY NIGHT AS NEEDED INSOMNIA    . famotidine (PEPCID) 20 MG tablet Take 1 tablet (20 mg total) by mouth at bedtime. [place]  tablet 5  . fluticasone (FLONASE) 50 MCG/ACT nasal spray Place 1 spray into both nostrils daily. 16 g 2  . gemfibrozil (LOPID) 600 MG tablet Take 1 tablet (600 mg total) by mouth 2 (two) times daily before a meal. 60 tablet 2  . glucose blood (ACCU-CHEK GUIDE) test strip Use as directed to test blood sugar once daily 100 each 12  . HYDROcodone-acetaminophen (NORCO) 10-325 MG tablet Take 1 tablet by mouth every 8 (eight) hours as needed.    [place] ketotifen (ZADITOR) 0.025 % ophthalmic solution Place 1 drop into both eyes 2 (two) times daily. (Patient taking differently: Place 1 drop into both eyes 2 (two) times daily as needed (allergies). ) 10 mL 1  . omeprazole (PRILOSEC) 40 MG capsule Take 1 capsule (40  mg total) by mouth every morning. Take 30-60 minutes before breakfast 30 capsule 5  . venlafaxine XR (EFFEXOR-XR) 150 MG 24 hr capsule TAKE 1 CAPSULE BY MOUTH EVERY DAY WITH BREAKFAST 90 capsule 0   No current facility-administered medications on file prior to visit.     Allergies  Allergen Reactions  . Nitroglycerin Other (See Comments)    Migraines, (only tried nitro patch. Has never tried the pills)  . Tricor [Fenofibrate]     Stomach upset    Social History   Socioeconomic History  . Marital status: Married    Spouse name: Not on file  . Number of children: Not on file  . Years of education: Not on file  . Highest education level: Not on file  Occupational History  . Occupation: Investment banker, operational  . Financial resource strain: Not on file  . Food insecurity    Worry: Not on file    Inability: Not on file  . Transportation needs    Medical: Not on file    Non-medical: Not on file  Tobacco Use  . Smoking status: Never Smoker  . Smokeless tobacco: Never Used  Substance and Sexual Activity  . Alcohol use: No    Alcohol/week: 0.0 standard drinks  . Drug use: No  . Sexual activity: Yes  Lifestyle  . Physical activity    Days per week: Not on file    Minutes per session: Not on file  . Stress: Not on file  Relationships  . Social Herbalist on phone: Not on file    Gets together: Not on file    Attends religious service: Not on file    Active member of club or organization: Not on file    Attends meetings of clubs or organizations: Not on file    Relationship status: Not on file  . Intimate partner violence    Fear of current or ex partner: Not on file    Emotionally abused: Not on file    Physically abused: Not on file    Forced sexual activity: Not on file  Other Topics Concern  . Not on file  Social History Narrative  . Not on file    Family History  Problem Relation Age of Onset  . Diabetes Mother   . Heart disease Mother   .  Hyperlipidemia Mother   . Hypertension Mother   . Renal cancer Maternal Aunt   . Stomach cancer Maternal Uncle   . Colon cancer Neg Hx   . Esophageal cancer Neg Hx   . Rectal cancer Neg Hx     Past Surgical History:  Procedure Laterality Date  . BACK SURGERY  2002  lumbar  . BROW LIFT Bilateral 09/17/2016   Procedure: BLEPHAROPLASTY;  Surgeon: Irene Limbo, MD;  Location: [facility];  Service: Plastics;  Laterality: Bilateral;  . CARPAL TUNNEL RELEASE Right   . CARPAL TUNNEL RELEASE Left 06/2016  . CESAREAN SECTION  05/20/2003   . ESOPHAGEAL MANOMETRY N/A 10/25/2015   Procedure: ESOPHAGEAL MANOMETRY (EM);  Surgeon: Jerene Bears, MD;  Location: [facility];  Service: Gastroenterology;  Laterality: N/A;  . PTOSIS REPAIR Bilateral 09/17/2016   Procedure: BILATERAL PTOSIS REPAIR EYELID WITH SUTURE TECHNIQUE, BILATERAL UPPER LID BLEPHAROPLASTY WITH EXCESS SKIN WEIGHING EYELID DOWN.;  Surgeon: Irene Limbo, MD;  Location: [facility];  Service: Plastics;  Laterality: Bilateral;  . SHOULDER SURGERY  08/2013    b/l shoulder   . SHOULDER SURGERY Right 11/2015    ROS: Review of Systems Negative except as stated above  PHYSICAL EXAM: BP 129/86 (BP Location: Left Arm, Patient Position: Sitting, Cuff Size: Normal)   Pulse 93   Temp 98.2 F (36.8 C) (Oral)   Resp 18   Ht '4\' 8"'  (1.422 m)   Wt 148 lb (67.1 kg)   LMP 05/30/2015   SpO2 99%   BMI 33.18 kg/m   BP 114/80 Physical Exam  General appearance - alert, well appearing, overweight middle-age female and in no distress Mental status - normal mood, behavior, speech, dress, motor activity, and thought processes Ears - ceruminosis noted bilaterally -hard wax Mouth -patient able to open mouth minimally.  Gums do not appear inflamed.  In the left upper jaw she is noted to have two 1/2 teeth questionably where  Crowns may have fallen off.  She expresses discomfort on palpation over the cheek and parotid  area.  No significant swelling noted of the cheek.  No erythema noted.  No enlargement of submandibular glands.  She has mild fullness of both parotid glands.  I do not feel a clicking on palpation over the TMJ Neck -no cervical lymphadenopathy  chest - clear to auscultation, no wheezes, rales or rhonchi, symmetric air entry Heart - normal rate, regular rhythm, normal S1, S2, no murmurs, rubs, clicks or gallops Extremities - peripheral pulses normal, no pedal edema, no clubbing or cyanosis   A1C: 7.2 CMP Latest Ref Rng & Units 04/27/2018 04/30/2017 09/13/2016  Glucose 65 - 99 mg/dL - 121(H) 97  BUN 6 - 20 mg/dL - 11 7  Creatinine 0.44 - 1.00 mg/dL - 0.67 0.59  Sodium 135 - 145 mmol/L - 135 138  Potassium 3.5 - 5.1 mmol/L - 4.3 4.4  Chloride 101 - 111 mmol/L - 99(L) 99(L)  CO2 22 - 32 mmol/L - 25 28  Calcium 8.9 - 10.3 mg/dL - 9.7 9.8  Total Protein 6.0 - 8.5 g/dL 7.5 7.6 -  Total Bilirubin 0.0 - 1.2 mg/dL 0.2 0.6 -  Alkaline Phos 39 - 117 IU/L 131(H) 82 -  AST 0 - 40 IU/L 74(H) 25 -  ALT 0 - 32 IU/L 79(H) 24 -   Lipid Panel     Component Value Date/Time   CHOL 498 (H) 04/27/2018 1623   TRIG 2,584 (HH) 04/27/2018 1623   HDL 15 (L) 04/27/2018 1623   CHOLHDL 33.2 (H) 04/27/2018 1623   CHOLHDL 12.6 (H) 12/21/2015 1725   VLDL NOT CALC 12/21/2015 1725   LDLCALC Comment 04/27/2018 1623    CBC    Component Value Date/Time   WBC 6.0 04/30/2017 2022   RBC 4.81 04/30/2017 2022   HGB 13.6 04/30/2017 2022  HCT 41.5 04/30/2017 2022   PLT 235 04/30/2017 2022   MCV 86.3 04/30/2017 2022   MCH 28.3 04/30/2017 2022   MCHC 32.8 04/30/2017 2022   RDW 14.2 04/30/2017 2022   LYMPHSABS 2.5 06/06/2016 2338   MONOABS 0.4 06/06/2016 2338   EOSABS 0.1 06/06/2016 2338   BASOSABS 0.1 06/06/2016 2338    ASSESSMENT AND PLAN: 1. Localized swelling, mass, and lump of head 2. Arthralgia of left temporomandibular joint -I recommend that patient be seen by a dentist.  However patient tells me that  she is seeing a dentist for this in the past and there was nothing that the dentist offered  -Advised to do warm compresses over the temporomandibular joint on this left side about 10 minutes prior to meals and take an ibuprofen prior to meals as I think she has a component of TMJ - CT Maxillofacial W/Cm; Future   3. Uncontrolled type 2 diabetes mellitus without complication, without long-term current use of insulin (HCC) A1c close to goal.  Dietary counseling given.  Encouraged her to get in some exercise above and be on her housework. - Hemoglobin A1c - Ambulatory referral to Ophthalmology - CBC; Future - Comprehensive metabolic panel; Future - Lipid panel; Future - sitaGLIPtin-metformin (JANUMET) 50-500 MG tablet; Take 1 tablet by mouth 2 (two) times daily with a meal.  Dispense: 60 tablet; Refill: 11  4. Essential hypertension At goal. - losartan (COZAAR) 100 MG tablet; Take 1 tablet (100 mg total) by mouth daily.  Dispense: 30 tablet; Refill: 6  5. Hypertriglyceridemia Patient not willing to take medication for this.  Advised that there is risks of medications affecting the liver but we keep a check on the liver function through blood work.  She would like to have her cholesterol rechecked fasting  6. Bilateral impacted cerumen Advised patient to purchase and use some wax softener over-the-counter for a few days.  She can come back as a nurse only visit to have the ears flushed out we can do it on her next visit.  In regards to her request for refill on hydrocodone, I advised the patient that we do not prescribe that here for noncancer pain.  Advised that she follow-up with her pain specialist.  Not at age yet for bone density study   Patient was given the opportunity to ask questions.  Patient verbalized understanding of the plan and was able to repeat key elements of the plan.   Orders Placed This Encounter  Procedures  . CT Maxillofacial W/Cm  . CBC  . Comprehensive  metabolic panel  . Lipid panel  . Ambulatory referral to Ophthalmology     Requested Prescriptions   Signed Prescriptions Disp Refills  . losartan (COZAAR) 100 MG tablet 30 tablet 6    Sig: Take 1 tablet (100 mg total) by mouth daily.  . sitaGLIPtin-metformin (JANUMET) 50-500 MG tablet 60 tablet 11    Sig: Take 1 tablet by mouth 2 (two) times daily with a meal.    Return in about 6 weeks (around 03/11/2019) for PAP.  Karle Plumber, MD, FACP

## 2019-01-28 NOTE — Patient Instructions (Signed)
Follow a Healthy Eating Plan - You can do it! Limit sugary drinks.  Avoid sodas, sweet tea, sport or energy drinks, or fruit drinks.  Drink water, lo-fat milk, or diet drinks. Limit snack foods.   Cut back on candy, cake, cookies, chips, ice cream.  These are a special treat, only in small amounts. Eat plenty of vegetables.  Especially dark green, red, and orange vegetables. Aim for at least 3 servings a day. More is better! Include fruit in your daily diet.  Whole fruit is much healthier than fruit juice! Limit "white" bread, "white" pasta, "white" rice.   Choose "100% whole grain" products, brown or wild rice. Avoid fatty meats. Try "Meatless Monday" and choose eggs or beans one day a week.  When eating meat, choose lean meats like chicken, [place], and fish.  Grill, broil, or bake meats instead of frying, and eat poultry without the skin. Eat less salt.  Avoid frozen pizzas, frozen dinners and salty foods.  Use seasonings other than salt in cooking.  This can help blood pressure and keep you from swelling Beer, wine and liquor have calories.  If you can safely drink alcohol, limit to 1 drink per day for women, 2 drinks for men  Please see your dentist also. We have ordered a CAT scan of your jaw.

## 2019-01-29 ENCOUNTER — Other Ambulatory Visit: Payer: Medicare HMO

## 2019-01-29 LAB — COMPREHENSIVE METABOLIC PANEL
ALT: 35 IU/L — ABNORMAL HIGH (ref 0–32)
AST: 26 IU/L (ref 0–40)
Albumin/Globulin Ratio: 1.6 (ref 1.2–2.2)
Albumin: 4.4 g/dL (ref 3.8–4.9)
Alkaline Phosphatase: 150 IU/L — ABNORMAL HIGH (ref 39–117)
BUN/Creatinine Ratio: 14 (ref 9–23)
BUN: 8 mg/dL (ref 6–24)
Bilirubin Total: <0.2 mg/dL (ref 0.0–1.2)
CO2: 20 mmol/L (ref 20–29)
Calcium: 9.6 mg/dL (ref 8.7–10.2)
Chloride: 99 mmol/L (ref 96–106)
Creatinine, Ser: 0.58 mg/dL (ref 0.57–1.00)
GFR calc Af Amer: 118 mL/min/{1.73_m2} (ref >59)
GFR calc non Af Amer: 103 mL/min/{1.73_m2} (ref >59)
Globulin, Total: 2.8 g/dL (ref 1.5–4.5)
Glucose: 132 mg/dL — ABNORMAL HIGH (ref 65–99)
Potassium: 4.6 mmol/L (ref 3.5–5.2)
Sodium: 136 mmol/L (ref 134–144)
Total Protein: 7.2 g/dL (ref 6.0–8.5)

## 2019-01-29 LAB — CBC
Hematocrit: 39.4 % (ref 34.0–46.6)
Hemoglobin: 13.5 g/dL (ref 11.1–15.9)
MCH: 29.5 pg (ref 26.6–33.0)
MCHC: 34.3 g/dL (ref 31.5–35.7)
MCV: 86 fL (ref 79–97)
Platelets: 222 10*3/uL (ref 150–450)
RBC: 4.57 x10E6/uL (ref 3.77–5.28)
RDW: 13.7 % (ref 11.7–15.4)
WBC: 5.4 10*3/uL (ref 3.4–10.8)

## 2019-01-29 LAB — LIPID PANEL
Chol/HDL Ratio: 14.2 ratio — ABNORMAL HIGH (ref 0.0–4.4)
Cholesterol, Total: 299 mg/dL — ABNORMAL HIGH (ref 100–199)
HDL: 21 mg/dL — ABNORMAL LOW (ref >39)
Triglycerides: 1609 mg/dL (ref 0–149)

## 2019-01-29 MED FILL — JANUMET 50-500 MG TABLET: 50-500 | 30 days supply | Qty: 60 | Fill #0

## 2019-02-03 ENCOUNTER — Encounter: Payer: Self-pay | Admitting: Internal Medicine

## 2019-02-12 ENCOUNTER — Other Ambulatory Visit: Payer: Self-pay

## 2019-02-12 ENCOUNTER — Encounter (HOSPITAL_COMMUNITY): Payer: Self-pay

## 2019-02-12 ENCOUNTER — Emergency Department (HOSPITAL_COMMUNITY)
Admission: EM | Admit: 2019-02-12 | Discharge: 2019-02-12 | Disposition: A | Discharge status: Home / Self Care | Payer: Medicare HMO | Attending: Emergency Medicine | Admitting: Emergency Medicine

## 2019-02-12 DIAGNOSIS — E119 Type 2 diabetes mellitus without complications: Secondary | ICD-10-CM | POA: Diagnosis not present

## 2019-02-12 DIAGNOSIS — X58XXXA Exposure to other specified factors, initial encounter: Secondary | ICD-10-CM | POA: Diagnosis not present

## 2019-02-12 DIAGNOSIS — Y999 Unspecified external cause status: Secondary | ICD-10-CM | POA: Diagnosis not present

## 2019-02-12 DIAGNOSIS — Y929 Unspecified place or not applicable: Secondary | ICD-10-CM | POA: Insufficient documentation

## 2019-02-12 DIAGNOSIS — T18128A Food in esophagus causing other injury, initial encounter: Secondary | ICD-10-CM

## 2019-02-12 DIAGNOSIS — Z7984 Long term (current) use of oral hypoglycemic drugs: Secondary | ICD-10-CM | POA: Diagnosis not present

## 2019-02-12 DIAGNOSIS — Z79899 Other long term (current) drug therapy: Secondary | ICD-10-CM | POA: Insufficient documentation

## 2019-02-12 DIAGNOSIS — I10 Essential (primary) hypertension: Secondary | ICD-10-CM | POA: Insufficient documentation

## 2019-02-12 DIAGNOSIS — Y939 Activity, unspecified: Secondary | ICD-10-CM | POA: Insufficient documentation

## 2019-02-12 DIAGNOSIS — R1013 Epigastric pain: Secondary | ICD-10-CM | POA: Diagnosis not present

## 2019-02-12 DIAGNOSIS — R1310 Dysphagia, unspecified: Secondary | ICD-10-CM | POA: Diagnosis present

## 2019-02-12 DIAGNOSIS — R1012 Left upper quadrant pain: Secondary | ICD-10-CM | POA: Diagnosis not present

## 2019-02-12 LAB — COMPREHENSIVE METABOLIC PANEL
ALT: 38 U/L (ref 0–44)
ALT: 33 U/L (ref 0–44)
AST: 36 U/L (ref 15–41)
AST: 31 U/L (ref 15–41)
Albumin: 5.2 g/dL — ABNORMAL HIGH (ref 3.5–5.0)
Albumin: 4.4 g/dL (ref 3.5–5.0)
Alkaline Phosphatase: 79 U/L (ref 38–126)
Alkaline Phosphatase: 68 U/L (ref 38–126)
Anion gap: 16 — ABNORMAL HIGH (ref 5–15)
Anion gap: 11 (ref 5–15)
BUN: 15 mg/dL (ref 6–20)
BUN: 12 mg/dL (ref 6–20)
CO2: 19 mmol/L — ABNORMAL LOW (ref 22–32)
CO2: 18 mmol/L — ABNORMAL LOW (ref 22–32)
Calcium: 10.4 mg/dL — ABNORMAL HIGH (ref 8.9–10.3)
Calcium: 9.2 mg/dL (ref 8.9–10.3)
Chloride: 96 mmol/L — ABNORMAL LOW (ref 98–111)
Chloride: 105 mmol/L (ref 98–111)
Creatinine, Ser: 1.47 mg/dL — ABNORMAL HIGH (ref 0.44–1.00)
Creatinine, Ser: 0.95 mg/dL (ref 0.44–1.00)
GFR calc Af Amer: 45 mL/min — ABNORMAL LOW (ref >60)
GFR calc Af Amer: >60 mL/min (ref >60)
GFR calc non Af Amer: 39 mL/min — ABNORMAL LOW (ref >60)
GFR calc non Af Amer: >60 mL/min (ref >60)
Glucose, Bld: 143 mg/dL — ABNORMAL HIGH (ref 70–99)
Glucose, Bld: 101 mg/dL — ABNORMAL HIGH (ref 70–99)
Potassium: 3.8 mmol/L (ref 3.5–5.1)
Potassium: 3.7 mmol/L (ref 3.5–5.1)
Sodium: 131 mmol/L — ABNORMAL LOW (ref 135–145)
Sodium: 134 mmol/L — ABNORMAL LOW (ref 135–145)
Total Bilirubin: 1 mg/dL (ref 0.3–1.2)
Total Bilirubin: 0.7 mg/dL (ref 0.3–1.2)
Total Protein: 8.8 g/dL — ABNORMAL HIGH (ref 6.5–8.1)
Total Protein: 7.8 g/dL (ref 6.5–8.1)

## 2019-02-12 LAB — CBC
HCT: 45.5 % (ref 36.0–46.0)
Hemoglobin: 15 g/dL (ref 12.0–15.0)
MCH: 28 pg (ref 26.0–34.0)
MCHC: 33 g/dL (ref 30.0–36.0)
MCV: 85 fL (ref 80.0–100.0)
Platelets: 307 10*3/uL (ref 150–400)
RBC: 5.35 MIL/uL — ABNORMAL HIGH (ref 3.87–5.11)
RDW: 13.4 % (ref 11.5–15.5)
WBC: 13.1 10*3/uL — ABNORMAL HIGH (ref 4.0–10.5)
nRBC: 0 % (ref 0.0–0.2)

## 2019-02-12 LAB — I-STAT BETA HCG BLOOD, ED (MC, WL, AP ONLY): I-stat hCG, quantitative: 5.5 m[IU]/mL — ABNORMAL HIGH (ref <5)

## 2019-02-12 LAB — PREGNANCY, URINE: Preg Test, Ur: NEGATIVE

## 2019-02-12 LAB — LIPASE, BLOOD: Lipase: 53 U/L — ABNORMAL HIGH (ref 11–51)

## 2019-02-12 MED ORDER — ALUM & MAG HYDROXIDE-SIMETH 200-200-20 MG/5ML PO SUSP
30.0000 mL | Freq: Once | ORAL | Status: AC
  Administered 2019-02-12: 30 mL via ORAL
  Filled 2019-02-12: qty 30

## 2019-02-12 MED ORDER — LIDOCAINE VISCOUS HCL 2 % MT SOLN
15.0000 mL | Freq: Once | OROMUCOSAL | Status: AC
  Administered 2019-02-12: 15 mL via ORAL
  Filled 2019-02-12: qty 15

## 2019-02-12 MED ORDER — SODIUM CHLORIDE 0.9 % IV BOLUS
1000.0000 mL | Freq: Once | INTRAVENOUS | Status: AC
  Administered 2019-02-12: 1000 mL via INTRAVENOUS

## 2019-02-12 MED ORDER — SODIUM CHLORIDE 0.9% FLUSH: 3.0000 mL | Freq: Once | INTRAVENOUS | Status: DC

## 2019-02-12 NOTE — ED Triage Notes (Signed)
Pt states that she has always had a swallowing problem, had something stuck in her throat, drank lots of water and then afterwards began to have upper abd pain and vomited about 4 times.

## 2019-02-12 NOTE — ED Provider Notes (Signed)
[facility] EMERGENCY DEPARTMENT Provider Note  CSN: [id number] Arrival date & time: 02/12/19 0031  Chief Complaint(s) Abdominal Pain  HPI Amy Glass is a 58 y.o. female with a past medical history listed below including diabetes on Janumet, Schatzki's ring and hiatal hernia with reported dysphagia and recurrent esophageal food impaction presents with another episode of reported food impaction and epigastric abdominal burning pain.  Patient reports that she believes that the food she ate 2 days ago was stuck in her esophagus.  Earlier today she began having the burning sensation.  She attempted to drink numerous sodas with out relief.  She was unable to tolerate oral intake and had numerous episodes of nonbloody nonbilious emesis.  This did not relieve the pain or the food bolus sensation.  She took Pepto-Bismol just prior to arrival causing her to have a heavy emesis which appears to have improved the abdominal pain.  She currently endorses mild left upper quadrant and epigastric discomfort.  She denies recent fevers or infections.  She is concerned that the food that was impacted might have gone bad.  Patient denies any chest pain or shortness of breath.  Denies any other physical complaints.  HPI  Past Medical History Past Medical History:  Diagnosis Date  . Allergy   . Anxiety    as child   . Depression    as child  . Diabetes mellitus without complication ([place]) Dx 8144  . Diverticulosis   . Hiatal hernia   . Hyperplastic colon polyp   . Hypertension Dx 2003  . Schatzki's ring    Patient Active Problem List   Diagnosis Date Noted  . Chest pain 05/28/2018  . Dysphagia 05/28/2018  . HTN (hypertension) 12/26/2016  . Depression 02/08/2016  . Rosacea 02/08/2016  . Acanthosis nigricans 09/27/2015  . TMJ pain dysfunction syndrome 01/16/2015  . Poor dentition 01/16/2015  . Lumbar degenerative disc disease 01/10/2015  . Vitamin D deficiency 12/05/2014   . Hypertriglyceridemia 12/05/2014  . Diabetes type 2, controlled ([place]) 12/02/2014   Home Medication(s) Prior to Admission medications   Medication Sig Start Date End Date Taking? Authorizing Provider  amphetamine-dextroamphetamine (ADDERALL) 30 MG tablet Take 30 mg by mouth 2 (two) times a day. 01/11/19  Yes [provider]  eszopiclone (LUNESTA) 2 MG TABS tablet Take 2 mg by mouth at bedtime as needed for sleep.  01/07/19  Yes [provider]  fluticasone (FLONASE) 50 MCG/ACT nasal spray Place 1 spray into both nostrils daily. 08/31/17  Yes Khatri, Hina, PA-C  losartan (COZAAR) 100 MG tablet Take 1 tablet (100 mg total) by mouth daily. 01/28/19  Yes [name], MD  omeprazole (PRILOSEC) 40 MG capsule Take 1 capsule (40 mg total) by mouth every morning. Take 30-60 minutes before breakfast Patient taking differently: Take 40 mg by mouth daily.  07/07/18  Yes Willia Craze, NP  PARoxetine (PAXIL) 10 MG tablet Take 10 mg by mouth daily. 02/08/19  Yes [provider]  sitaGLIPtin-metformin (JANUMET) 50-500 MG tablet Take 1 tablet by mouth 2 (two) times daily with a meal. 01/28/19  Yes [name], MD  venlafaxine XR (EFFEXOR-XR) 150 MG 24 hr capsule TAKE 1 CAPSULE BY MOUTH EVERY DAY WITH BREAKFAST Patient taking differently: Take 150 mg by mouth daily with breakfast.  12/28/18  Yes [name], MD  ACCU-CHEK FASTCLIX LANCETS MISC Use as directed to test blood sugar once daily 06/16/18   [name], MD  Blood Glucose Monitoring  Suppl (ACCU-CHEK GUIDE) w/Device KIT 1 each by Does not apply route daily. Use as directed to test blood sugar once daily 06/16/18   [name], MD  famotidine (PEPCID) 20 MG tablet Take 1 tablet (20 mg total) by mouth at bedtime. Patient not taking: Reported on 02/12/2019 07/07/18   Willia Craze, NP  gemfibrozil (LOPID) 600 MG tablet Take 1 tablet (600 mg total) by mouth 2 (two) times daily before a meal. Patient  not taking: Reported on 02/12/2019 04/29/18   [name], MD  glucose blood (ACCU-CHEK GUIDE) test strip Use as directed to test blood sugar once daily 06/16/18   [name], MD  ketotifen (ZADITOR) 0.025 % ophthalmic solution Place 1 drop into both eyes 2 (two) times daily. Patient not taking: Reported on 02/12/2019 12/26/16   Boykin Nearing, MD                                                                                                                                    Past Surgical History Past Surgical History:  Procedure Laterality Date  . BACK SURGERY  2002   lumbar  . BROW LIFT Bilateral 09/17/2016   Procedure: BLEPHAROPLASTY;  Surgeon: Irene Limbo, MD;  Location: [place];  Service: Plastics;  Laterality: Bilateral;  . CARPAL TUNNEL RELEASE Right   . CARPAL TUNNEL RELEASE Left 06/2016  . CESAREAN SECTION  05/20/2003   . ESOPHAGEAL MANOMETRY N/A 10/25/2015   Procedure: ESOPHAGEAL MANOMETRY (EM);  Surgeon: Jerene Bears, MD;  Location: [facility];  Service: Gastroenterology;  Laterality: N/A;  . PTOSIS REPAIR Bilateral 09/17/2016   Procedure: BILATERAL PTOSIS REPAIR EYELID WITH SUTURE TECHNIQUE, BILATERAL UPPER LID BLEPHAROPLASTY WITH EXCESS SKIN WEIGHING EYELID DOWN.;  Surgeon: Irene Limbo, MD;  Location: [place];  Service: Plastics;  Laterality: Bilateral;  . SHOULDER SURGERY  08/2013    b/l shoulder   . SHOULDER SURGERY Right 11/2015   Family History Family History  Problem Relation Age of Onset  . Diabetes Mother   . Heart disease Mother   . Hyperlipidemia Mother   . Hypertension Mother   . Renal cancer Maternal Aunt   . Stomach cancer Maternal Uncle   . Colon cancer Neg Hx   . Esophageal cancer Neg Hx   . Rectal cancer Neg Hx     Social History Social History   Tobacco Use  . Smoking status: Never Smoker  . Smokeless tobacco: Never Used  Substance Use Topics  . Alcohol use: No    Alcohol/week: 0.0  standard drinks  . Drug use: No   Allergies Nitroglycerin and Tricor [fenofibrate]  Review of Systems Review of Systems All other systems are reviewed and are negative for acute change except as noted in the HPI  Physical Exam Vital Signs  I have reviewed the triage vital signs BP 128/88   Pulse 88   Temp 98.3 F (  36.8 C) (Oral)   Resp 16   LMP 05/30/2015   SpO2 99%   Physical Exam Vitals signs reviewed.  Constitutional:      General: She is not in acute distress.    Appearance: She is well-developed. She is not diaphoretic.  HENT:     Head: Normocephalic and atraumatic.     Right Ear: External ear normal.     Left Ear: External ear normal.     Nose: Nose normal.  Eyes:     General: No scleral icterus.    Conjunctiva/sclera: Conjunctivae normal.  Neck:     Musculoskeletal: Normal range of motion.     Trachea: Phonation normal.  Cardiovascular:     Rate and Rhythm: Normal rate and regular rhythm.  Pulmonary:     Effort: Pulmonary effort is normal. No respiratory distress.     Breath sounds: No stridor.  Abdominal:     General: There is no distension.     Palpations: Abdomen is soft.     Tenderness: There is abdominal tenderness (mild discomfort) in the left upper quadrant. There is no guarding. Negative signs include Murphy's sign and McBurney's sign.  Musculoskeletal: Normal range of motion.  Neurological:     Mental Status: She is alert and oriented to person, place, and time.  Psychiatric:        Behavior: Behavior normal.     ED Results and Treatments Labs (all labs ordered are listed, but only abnormal results are displayed) Labs Reviewed  LIPASE, BLOOD - Abnormal; Notable for the following components:      Result Value   Lipase 53 (*)    All other components within normal limits  COMPREHENSIVE METABOLIC PANEL - Abnormal; Notable for the following components:   Sodium 131 (*)    Chloride 96 (*)    CO2 19 (*)    Glucose, Bld 143 (*)    Creatinine,  Ser 1.47 (*)    Calcium 10.4 (*)    Total Protein 8.8 (*)    Albumin 5.2 (*)    GFR calc non Af Amer 39 (*)    GFR calc Af Amer 45 (*)    Anion gap 16 (*)    All other components within normal limits  CBC - Abnormal; Notable for the following components:   WBC 13.1 (*)    RBC 5.35 (*)    All other components within normal limits  COMPREHENSIVE METABOLIC PANEL - Abnormal; Notable for the following components:   Sodium 134 (*)    CO2 18 (*)    Glucose, Bld 101 (*)    All other components within normal limits  I-STAT BETA HCG BLOOD, ED ([facility], [facility], [facility] ONLY) - Abnormal; Notable for the following components:   I-stat hCG, quantitative 5.5 (*)    All other components within normal limits  PREGNANCY, URINE  EKG  EKG Interpretation  Date/Time:    Ventricular Rate:    PR Interval:    QRS Duration:   QT Interval:    QTC Calculation:   R Axis:     Text Interpretation:        Radiology No results found.  Pertinent labs & imaging results that were available during my care of the patient were reviewed by me and considered in my medical decision making (see chart for details).  Medications Ordered in ED Medications  sodium chloride flush (NS) 0.9 % injection 3 mL (has no administration in time range)  sodium chloride 0.9 % bolus 1,000 mL (1,000 mLs Intravenous New Bag/Given 02/12/19 0346)  alum & mag hydroxide-simeth (MAALOX/MYLANTA) 200-200-20 MG/5ML suspension 30 mL (30 mLs Oral Given 02/12/19 0345)    And  lidocaine (XYLOCAINE) 2 % viscous mouth solution 15 mL (15 mLs Oral Given 02/12/19 0345)                                                                                                                                    Procedures Procedures  (including critical care time)  Medical Decision Making / ED Course I have reviewed the nursing notes for this encounter  and the patient's prior records (if available in EHR or on provided paperwork).   Amy Glass was evaluated in Emergency Department on 02/12/2019 for the symptoms described in the history of present illness. She was evaluated in the context of the global COVID-19 pandemic, which necessitated consideration that the patient might be at risk for infection with the SARS-CoV-2 virus that causes COVID-19. Institutional protocols and algorithms that pertain to the evaluation of patients at risk for COVID-19 are in a state of rapid change based on information released by regulatory bodies including the CDC and federal and state organizations. These policies and algorithms were followed during the patient's care in the ED.  3:41 AM Patient presents with what appears to be recurrent food impaction, which appears to have resolved.  She had associated epigastric abdominal burning which began also has improved.  She has mild discomfort to left upper quadrant without evidence of peritonitis.  Screening labs obtain in triage revealed leukocytosis likely demargination from recent emesis.  Labs are notable for mild AKI consistent likely from dehydration due to lack of p.o. intake.    Currently I have a low suspicion for serious intra-abdominal inflammatory/infectious process or bowel obstruction.  We will treat patient symptomatically with GI cocktail and IV fluids and reassess.  If patient does not improve will expand work-up at that time.   6:00 AM Patient reports complete resolution of her symptoms.  Able to tolerate oral intake.  Repeat labs with improved renal function and electrolytes.  No evidence of DKA.  The patient appears reasonably screened and/or stabilized for discharge and I doubt any other medical condition or other [facility] requiring further screening, evaluation, or treatment in the  ED at this time prior to discharge.  The patient is safe for discharge with strict return precautions.       Final Clinical Impression(s) / ED Diagnoses Final diagnoses:  Food impaction of esophagus, initial encounter  Epigastric burning sensation    The patient appears reasonably screened and/or stabilized for discharge and I doubt any other medical condition or other [facility] requiring further screening, evaluation, or treatment in the ED at this time prior to discharge.  Disposition: Discharge  Condition: Good  I have discussed the results, Dx and Tx plan with the patient who expressed understanding and agree(s) with the plan. Discharge instructions discussed at great length. The patient was given strict return precautions who verbalized understanding of the instructions. No further questions at time of discharge.    ED Discharge Orders    None      Follow Up: [name], MD [place] [address] [phone]  Schedule an appointment as soon as possible for a visit  As needed      This chart was dictated using voice recognition software.  Despite best efforts to proofread,  errors can occur which can change the documentation meaning.   Fatima Blank, MD 02/12/19 0600

## 2019-02-12 NOTE — ED Notes (Signed)
Patient verbalizes understanding of discharge instructions. Opportunity for questioning and answers were provided. Armband removed by staff, pt discharged from ED. Pt ambulatory, discharged home.

## 2019-02-15 DIAGNOSIS — F3132 Bipolar disorder, current episode depressed, moderate: Secondary | ICD-10-CM | POA: Diagnosis not present

## 2019-02-22 DIAGNOSIS — F3132 Bipolar disorder, current episode depressed, moderate: Secondary | ICD-10-CM | POA: Diagnosis not present

## 2019-03-01 DIAGNOSIS — F3132 Bipolar disorder, current episode depressed, moderate: Secondary | ICD-10-CM | POA: Diagnosis not present

## 2019-03-08 DIAGNOSIS — F3132 Bipolar disorder, current episode depressed, moderate: Secondary | ICD-10-CM | POA: Diagnosis not present

## 2019-03-12 ENCOUNTER — Ambulatory Visit: Payer: Medicare HMO | Attending: Internal Medicine | Admitting: Internal Medicine

## 2019-03-12 ENCOUNTER — Other Ambulatory Visit: Payer: Self-pay

## 2019-03-15 DIAGNOSIS — F3132 Bipolar disorder, current episode depressed, moderate: Secondary | ICD-10-CM | POA: Diagnosis not present

## 2019-03-22 MED FILL — JANUMET 50-500 MG TABLET: 50-500 | 30 days supply | Qty: 60 | Fill #1

## 2019-04-01 DIAGNOSIS — F902 Attention-deficit hyperactivity disorder, combined type: Secondary | ICD-10-CM | POA: Diagnosis not present

## 2019-04-01 DIAGNOSIS — F411 Generalized anxiety disorder: Secondary | ICD-10-CM | POA: Diagnosis not present

## 2019-04-01 DIAGNOSIS — F3181 Bipolar II disorder: Secondary | ICD-10-CM | POA: Diagnosis not present

## 2019-04-06 DIAGNOSIS — F3132 Bipolar disorder, current episode depressed, moderate: Secondary | ICD-10-CM | POA: Diagnosis not present

## 2019-04-20 DIAGNOSIS — F3132 Bipolar disorder, current episode depressed, moderate: Secondary | ICD-10-CM | POA: Diagnosis not present

## 2019-04-30 ENCOUNTER — Telehealth: Payer: Self-pay | Admitting: Internal Medicine

## 2019-04-30 DIAGNOSIS — G8929 Other chronic pain: Secondary | ICD-10-CM

## 2019-04-30 DIAGNOSIS — M542 Cervicalgia: Secondary | ICD-10-CM

## 2019-04-30 DIAGNOSIS — M5136 Other intervertebral disc degeneration, lumbar region: Secondary | ICD-10-CM

## 2019-04-30 MED FILL — JANUMET 50-500 MG TABLET: 50-500 | 30 days supply | Qty: 60 | Fill #2

## 2019-04-30 NOTE — Telephone Encounter (Signed)
New Message   Pt is requesting a referral to the pain clinic. Please f/u

## 2019-05-03 ENCOUNTER — Telehealth: Payer: Self-pay | Admitting: Internal Medicine

## 2019-05-03 ENCOUNTER — Other Ambulatory Visit: Payer: Self-pay | Admitting: Family Medicine

## 2019-05-03 DIAGNOSIS — Z1231 Encounter for screening mammogram for malignant neoplasm of breast: Secondary | ICD-10-CM

## 2019-05-03 DIAGNOSIS — E781 Pure hyperglyceridemia: Secondary | ICD-10-CM

## 2019-05-03 MED ORDER — GEMFIBROZIL 600 MG PO TABS: 600.0000 mg | ORAL_TABLET | Freq: Two times a day (BID) | ORAL | 2 refills | Status: DC

## 2019-05-03 NOTE — Telephone Encounter (Signed)
Patient states when she called the pain clinic they informed her that it had been too long and would need to have PCP send another referral.    Also, while speaking to patient, she informed me that she would come into office for lab test. Informed patient that no lab test were pending. Advised her of the last lab note and provided education on pancreatitis. Patient has not taking the cholesterol medication. She states she is fearful of the side effects. Advised patient of increase risk of heart attack and stroke with DM w/o taking medication to address her cholesterol. Patient verbalized understanding and stated she would call pharmacy to pick up medication.   Patient is appreciative of discussion. Will call back if she has additional concerns.

## 2019-05-03 NOTE — Telephone Encounter (Signed)
1) Medication(s) Requested (by name): -gemfibrozil (LOPID) 600 MG tablet  Pt states she takes two medications for cholesterol I only see one 2) Pharmacy of Choice: -:  [id number] - [place], [place]

## 2019-05-04 NOTE — Telephone Encounter (Signed)
The last referral was sent 07/2017  Per Patient request Pt. Called requesting for her pain medicine referral to be sent to Attn: Nira Conn Fax:[phone]    Phone: [place]. I don't know if is the same  I called patient and no answer

## 2019-05-05 ENCOUNTER — Encounter: Payer: Self-pay | Admitting: Internal Medicine

## 2019-05-13 ENCOUNTER — Telehealth: Payer: Self-pay | Admitting: Internal Medicine

## 2019-05-13 NOTE — Telephone Encounter (Signed)
New Message   Pt calling states she was suppose to have a referral sent to the pain clinic for pain medication but she spoke with them and they said they never receive anything. Please f/u

## 2019-05-14 NOTE — Telephone Encounter (Signed)
Will forward to [facility]

## 2019-05-17 DIAGNOSIS — H02831 Dermatochalasis of right upper eyelid: Secondary | ICD-10-CM | POA: Diagnosis not present

## 2019-05-17 DIAGNOSIS — H0102B Squamous blepharitis left eye, upper and lower eyelids: Secondary | ICD-10-CM | POA: Diagnosis not present

## 2019-05-17 DIAGNOSIS — H02423 Myogenic ptosis of bilateral eyelids: Secondary | ICD-10-CM | POA: Diagnosis not present

## 2019-05-17 DIAGNOSIS — H02834 Dermatochalasis of left upper eyelid: Secondary | ICD-10-CM | POA: Diagnosis not present

## 2019-05-17 DIAGNOSIS — E119 Type 2 diabetes mellitus without complications: Secondary | ICD-10-CM | POA: Diagnosis not present

## 2019-05-17 DIAGNOSIS — H0102A Squamous blepharitis right eye, upper and lower eyelids: Secondary | ICD-10-CM | POA: Diagnosis not present

## 2019-05-17 DIAGNOSIS — H2513 Age-related nuclear cataract, bilateral: Secondary | ICD-10-CM | POA: Diagnosis not present

## 2019-05-17 LAB — HM DIABETES EYE EXAM

## 2019-05-17 NOTE — Telephone Encounter (Signed)
I spoke to patient and she wants me to send her referral to   Dr Brandy Hale MD [facility]   PH# [phone] Fax# [place]

## 2019-05-21 ENCOUNTER — Ambulatory Visit: Payer: Medicare HMO | Admitting: Family Medicine

## 2019-05-27 ENCOUNTER — Ambulatory Visit: Payer: Medicare HMO

## 2019-06-09 DIAGNOSIS — F3132 Bipolar disorder, current episode depressed, moderate: Secondary | ICD-10-CM | POA: Diagnosis not present

## 2019-06-10 MED FILL — JANUMET 50-500 MG TABLET: 50-500 | 30 days supply | Qty: 60 | Fill #3

## 2019-06-16 ENCOUNTER — Other Ambulatory Visit: Payer: Self-pay

## 2019-06-16 ENCOUNTER — Ambulatory Visit
Admission: RE | Admit: 2019-06-16 | Discharge: 2019-06-16 | Disposition: A | Discharge status: Home / Self Care | Payer: Medicare HMO | Source: Ambulatory Visit | Attending: Family Medicine | Admitting: Family Medicine

## 2019-06-16 DIAGNOSIS — Z1231 Encounter for screening mammogram for malignant neoplasm of breast: Secondary | ICD-10-CM | POA: Diagnosis not present

## 2019-06-29 ENCOUNTER — Telehealth: Payer: Self-pay | Admitting: Internal Medicine

## 2019-06-29 DIAGNOSIS — F419 Anxiety disorder, unspecified: Secondary | ICD-10-CM

## 2019-06-29 NOTE — Telephone Encounter (Signed)
Patient called stating she goes to [facility] on [address], [place], [place] [address] for [facility] but patient would like to go somewhere else. Patient does not know where to go and would like for her PCP to refer her out. Please f/u

## 2019-06-29 NOTE — Telephone Encounter (Signed)
Will forward to pcp

## 2019-06-30 DIAGNOSIS — F902 Attention-deficit hyperactivity disorder, combined type: Secondary | ICD-10-CM | POA: Diagnosis not present

## 2019-06-30 DIAGNOSIS — F3181 Bipolar II disorder: Secondary | ICD-10-CM | POA: Diagnosis not present

## 2019-06-30 DIAGNOSIS — F411 Generalized anxiety disorder: Secondary | ICD-10-CM | POA: Diagnosis not present

## 2019-06-30 DIAGNOSIS — F3132 Bipolar disorder, current episode depressed, moderate: Secondary | ICD-10-CM | POA: Diagnosis not present

## 2019-07-01 DIAGNOSIS — M65312 Trigger thumb, left thumb: Secondary | ICD-10-CM | POA: Diagnosis not present

## 2019-07-01 DIAGNOSIS — M67912 Unspecified disorder of synovium and tendon, left shoulder: Secondary | ICD-10-CM | POA: Diagnosis not present

## 2019-07-01 DIAGNOSIS — M24812 Other specific joint derangements of left shoulder, not elsewhere classified: Secondary | ICD-10-CM | POA: Diagnosis not present

## 2019-07-07 ENCOUNTER — Telehealth: Payer: Self-pay | Admitting: Internal Medicine

## 2019-07-07 NOTE — Telephone Encounter (Signed)
Open in error

## 2019-07-08 ENCOUNTER — Other Ambulatory Visit: Payer: Self-pay | Admitting: Nurse Practitioner

## 2019-07-09 NOTE — Telephone Encounter (Signed)
I'm unable to see the psychiatry referral they go straight to [facility]  the cma can see it  but I can't

## 2019-07-14 DIAGNOSIS — S43432A Superior glenoid labrum lesion of left shoulder, initial encounter: Secondary | ICD-10-CM | POA: Diagnosis not present

## 2019-07-14 DIAGNOSIS — M65312 Trigger thumb, left thumb: Secondary | ICD-10-CM | POA: Diagnosis not present

## 2019-07-14 DIAGNOSIS — M7542 Impingement syndrome of left shoulder: Secondary | ICD-10-CM | POA: Diagnosis not present

## 2019-07-14 DIAGNOSIS — M7522 Bicipital tendinitis, left shoulder: Secondary | ICD-10-CM | POA: Diagnosis not present

## 2019-07-14 DIAGNOSIS — G8918 Other acute postprocedural pain: Secondary | ICD-10-CM | POA: Diagnosis not present

## 2019-07-14 DIAGNOSIS — M19012 Primary osteoarthritis, left shoulder: Secondary | ICD-10-CM | POA: Diagnosis not present

## 2019-07-19 DIAGNOSIS — M6281 Muscle weakness (generalized): Secondary | ICD-10-CM | POA: Diagnosis not present

## 2019-07-19 DIAGNOSIS — M25312 Other instability, left shoulder: Secondary | ICD-10-CM | POA: Diagnosis not present

## 2019-07-19 DIAGNOSIS — M25512 Pain in left shoulder: Secondary | ICD-10-CM | POA: Diagnosis not present

## 2019-07-22 DIAGNOSIS — M25512 Pain in left shoulder: Secondary | ICD-10-CM | POA: Diagnosis not present

## 2019-07-22 DIAGNOSIS — M25312 Other instability, left shoulder: Secondary | ICD-10-CM | POA: Diagnosis not present

## 2019-07-22 DIAGNOSIS — M6281 Muscle weakness (generalized): Secondary | ICD-10-CM | POA: Diagnosis not present

## 2019-07-25 ENCOUNTER — Other Ambulatory Visit: Payer: Self-pay | Admitting: Internal Medicine

## 2019-07-25 DIAGNOSIS — E781 Pure hyperglyceridemia: Secondary | ICD-10-CM

## 2019-07-26 DIAGNOSIS — F3132 Bipolar disorder, current episode depressed, moderate: Secondary | ICD-10-CM | POA: Diagnosis not present

## 2019-07-28 DIAGNOSIS — M6281 Muscle weakness (generalized): Secondary | ICD-10-CM | POA: Diagnosis not present

## 2019-07-28 DIAGNOSIS — M25312 Other instability, left shoulder: Secondary | ICD-10-CM | POA: Diagnosis not present

## 2019-07-28 DIAGNOSIS — M25512 Pain in left shoulder: Secondary | ICD-10-CM | POA: Diagnosis not present

## 2019-07-29 MED FILL — JANUMET 50-500 MG TABLET: 50-500 | 30 days supply | Qty: 60 | Fill #4

## 2019-08-06 DIAGNOSIS — M6281 Muscle weakness (generalized): Secondary | ICD-10-CM | POA: Diagnosis not present

## 2019-08-06 DIAGNOSIS — M25312 Other instability, left shoulder: Secondary | ICD-10-CM | POA: Diagnosis not present

## 2019-08-06 DIAGNOSIS — M25512 Pain in left shoulder: Secondary | ICD-10-CM | POA: Diagnosis not present

## 2019-08-09 DIAGNOSIS — F3132 Bipolar disorder, current episode depressed, moderate: Secondary | ICD-10-CM | POA: Diagnosis not present

## 2019-08-10 ENCOUNTER — Ambulatory Visit: Payer: Medicare HMO | Attending: Internal Medicine

## 2019-08-10 DIAGNOSIS — Z20822 Contact with and (suspected) exposure to covid-19: Secondary | ICD-10-CM | POA: Diagnosis not present

## 2019-08-11 LAB — NOVEL CORONAVIRUS, NAA: SARS-CoV-2, NAA: DETECTED — AB

## 2019-08-12 ENCOUNTER — Telehealth: Payer: Self-pay | Admitting: Internal Medicine

## 2019-08-12 ENCOUNTER — Other Ambulatory Visit: Payer: Self-pay | Admitting: Internal Medicine

## 2019-08-12 DIAGNOSIS — U071 COVID-19: Secondary | ICD-10-CM

## 2019-08-12 DIAGNOSIS — E119 Type 2 diabetes mellitus without complications: Secondary | ICD-10-CM

## 2019-08-12 DIAGNOSIS — I10 Essential (primary) hypertension: Secondary | ICD-10-CM

## 2019-08-12 NOTE — Telephone Encounter (Signed)
Patient stated that she tested positive for covid yesterday, and wanted to know if there was anything OTC or anything that could be could taken to help her through the recovery process. Patient stated that she feels like its getting a little harder for her to breathe. Please fu at your earliest convenience. Please fu at your earliest convenience.

## 2019-08-12 NOTE — Progress Notes (Unsigned)
  I connected by phone with Amy Glass on 08/12/2019 at 11:24 AM to discuss the potential use of an new treatment for mild to moderate COVID-19 viral infection in non-hospitalized patients.  This patient is a 58 y.o. female that meets the FDA criteria for Emergency Use Authorization of bamlanivimab or casirivimab\imdevimab.  Has a (+) direct SARS-CoV-2 viral test result  Has mild or moderate COVID-19   Is ? 58 years of age and weighs ? 40 kg  Is NOT hospitalized due to COVID-19  Is NOT requiring oxygen therapy or requiring an increase in baseline oxygen flow rate due to COVID-19  Is within 10 days of symptom onset  Has at least one of the high risk factor(s) for progression to severe COVID-19 and/or hospitalization as defined in EUA.  Specific high risk criteria :hx DM2 and >55yo c Hypertension   I have spoken and communicated the following to the patient or parent/caregiver:  1. FDA has authorized the emergency use of bamlanivimab and casirivimab\imdevimab for the treatment of mild to moderate COVID-19 in adults and pediatric patients with positive results of direct SARS-CoV-2 viral testing who are 44 years of age and older weighing at least 40 kg, and who are at high risk for progressing to severe COVID-19 and/or hospitalization.  2. The significant known and potential risks and benefits of bamlanivimab and casirivimab\imdevimab, and the extent to which such potential risks and benefits are unknown.  3. Information on available alternative treatments and the risks and benefits of those alternatives, including clinical trials.  4. Patients treated with bamlanivimab and casirivimab\imdevimab should continue to self-isolate and use infection control measures (e.g., wear mask, isolate, social distance, avoid sharing personal items, clean and disinfect "high touch" surfaces, and frequent handwashing) according to CDC guidelines.   5. The patient or parent/caregiver has the  option to accept or refuse bamlanivimab or casirivimab\imdevimab .  After reviewing this information with the patient, The patient agreed to proceed with receiving the bamlanimivab infusion and will be provided a copy of the Fact sheet prior to receiving the infusion.Alan Ripper, NP-C [facility] [place]  pgr [phone]

## 2019-08-13 ENCOUNTER — Telehealth: Payer: Self-pay | Admitting: Internal Medicine

## 2019-08-13 ENCOUNTER — Encounter: Payer: Self-pay | Admitting: Internal Medicine

## 2019-08-13 MED ORDER — ALBUTEROL SULFATE HFA 108 (90 BASE) MCG/ACT IN AERS: 2.0000 | INHALATION_SPRAY | Freq: Four times a day (QID) | RESPIRATORY_TRACT | 0 refills | Status: DC | PRN

## 2019-08-13 MED ORDER — BENZONATATE 100 MG PO CAPS: 100.0000 mg | ORAL_CAPSULE | Freq: Three times a day (TID) | ORAL | 0 refills | Status: DC | PRN

## 2019-08-13 NOTE — Telephone Encounter (Signed)
I spoke to this patient and gave her the information she requested.

## 2019-08-13 NOTE — Telephone Encounter (Signed)
Contacted pt to go over Dr. Johnson response pt is aware and doesn't have any questions or concerns  

## 2019-08-13 NOTE — Telephone Encounter (Signed)
Patient is calling requesting to speak with the provider regarding her infusion that she will be having due to [place]. Please f/u with patient.

## 2019-08-13 NOTE — Telephone Encounter (Signed)
Will forward to pcp

## 2019-08-14 ENCOUNTER — Encounter (HOSPITAL_COMMUNITY): Payer: Self-pay | Admitting: *Deleted

## 2019-08-14 ENCOUNTER — Emergency Department (HOSPITAL_COMMUNITY)
Admission: EM | Admit: 2019-08-14 | Discharge: 2019-08-14 | Disposition: A | Discharge status: Home / Self Care | Payer: Medicare HMO | Attending: Emergency Medicine | Admitting: Emergency Medicine

## 2019-08-14 ENCOUNTER — Other Ambulatory Visit: Payer: Self-pay

## 2019-08-14 DIAGNOSIS — U071 COVID-19: Secondary | ICD-10-CM | POA: Insufficient documentation

## 2019-08-14 DIAGNOSIS — R0789 Other chest pain: Secondary | ICD-10-CM | POA: Insufficient documentation

## 2019-08-14 DIAGNOSIS — Z5321 Procedure and treatment not carried out due to patient leaving prior to being seen by health care provider: Secondary | ICD-10-CM | POA: Insufficient documentation

## 2019-08-14 DIAGNOSIS — M7918 Myalgia, other site: Secondary | ICD-10-CM | POA: Insufficient documentation

## 2019-08-14 NOTE — ED Triage Notes (Signed)
The pt was tested positive for covid  This past Monday [facility] ???  Today  She feels strange in her chest  No son  No severe pain she feels like her face is swollen on the rt. No temp  Body aches.  She is scared because she hears all the talk on the tv  And she kknew a man that was diagnosed with covid and did not go to the hospital  And now hes dead.  No acute distress  A and o

## 2019-08-15 ENCOUNTER — Ambulatory Visit (HOSPITAL_COMMUNITY)
Admission: RE | Admit: 2019-08-15 | Discharge: 2019-08-15 | Disposition: A | Discharge status: Home / Self Care | Payer: Medicare Other | Source: Ambulatory Visit | Attending: Pulmonary Disease | Admitting: Pulmonary Disease

## 2019-08-15 ENCOUNTER — Encounter (HOSPITAL_COMMUNITY): Payer: Self-pay

## 2019-08-15 DIAGNOSIS — E119 Type 2 diabetes mellitus without complications: Secondary | ICD-10-CM | POA: Diagnosis present

## 2019-08-15 DIAGNOSIS — I10 Essential (primary) hypertension: Secondary | ICD-10-CM | POA: Diagnosis present

## 2019-08-15 DIAGNOSIS — Z23 Encounter for immunization: Secondary | ICD-10-CM | POA: Insufficient documentation

## 2019-08-15 DIAGNOSIS — U071 COVID-19: Secondary | ICD-10-CM | POA: Diagnosis not present

## 2019-08-15 MED ORDER — ALBUTEROL SULFATE HFA 108 (90 BASE) MCG/ACT IN AERS: 2.0000 | INHALATION_SPRAY | Freq: Once | RESPIRATORY_TRACT | Status: DC | PRN

## 2019-08-15 MED ORDER — SODIUM CHLORIDE 0.9 % IV SOLN
INTRAVENOUS | Status: DC | PRN
  Administered 2019-08-15: 09:00:00 250 mL via INTRAVENOUS

## 2019-08-15 MED ORDER — SODIUM CHLORIDE 0.9 % IV SOLN
700.0000 mg | Freq: Once | INTRAVENOUS | Status: AC
  Administered 2019-08-15: 700 mg via INTRAVENOUS
  Filled 2019-08-15: qty 20

## 2019-08-15 MED ORDER — FAMOTIDINE IN NACL 20-0.9 MG/50ML-% IV SOLN: 20.0000 mg | Freq: Once | INTRAVENOUS | Status: DC | PRN

## 2019-08-15 MED ORDER — EPINEPHRINE 0.3 MG/0.3ML IJ SOAJ: 0.3000 mg | Freq: Once | INTRAMUSCULAR | Status: DC | PRN

## 2019-08-15 MED ORDER — METHYLPREDNISOLONE SODIUM SUCC 125 MG IJ SOLR: 125.0000 mg | Freq: Once | INTRAMUSCULAR | Status: DC | PRN

## 2019-08-15 MED ORDER — DIPHENHYDRAMINE HCL 50 MG/ML IJ SOLN: 50.0000 mg | Freq: Once | INTRAMUSCULAR | Status: DC | PRN

## 2019-08-15 NOTE — Discharge Instructions (Signed)

## 2019-08-15 NOTE — Progress Notes (Signed)
  Diagnosis: COVID-19  Physician: Dr. Wright  Procedure: Covid Infusion Clinic Med: bamlanivimab infusion - Provided patient with bamlanimivab fact sheet for patients, parents and caregivers prior to infusion.  Complications: No immediate complications noted.  Discharge: Discharged home   Sylvia Kondracki S Nettie Cromwell 08/15/2019  

## 2019-08-20 ENCOUNTER — Ambulatory Visit: Payer: Medicare Other | Attending: Internal Medicine

## 2019-08-20 DIAGNOSIS — Z20822 Contact with and (suspected) exposure to covid-19: Secondary | ICD-10-CM | POA: Diagnosis not present

## 2019-08-21 LAB — NOVEL CORONAVIRUS, NAA: SARS-CoV-2, NAA: NOT DETECTED

## 2019-08-23 ENCOUNTER — Other Ambulatory Visit: Payer: Self-pay

## 2019-08-23 ENCOUNTER — Ambulatory Visit: Payer: Medicare Other | Attending: Nurse Practitioner | Admitting: Nurse Practitioner

## 2019-08-23 NOTE — Progress Notes (Signed)
Please disregard this encounter.

## 2019-08-25 ENCOUNTER — Telehealth: Payer: Self-pay | Admitting: Internal Medicine

## 2019-08-25 NOTE — Telephone Encounter (Signed)
Patient called states she had shoulder surgery and it is itchy did also test positive for COVID. Would like provider to send a cream to pharmacy. Please call her at [phone]

## 2019-08-26 NOTE — Telephone Encounter (Signed)
Will forward to pcp

## 2019-08-27 NOTE — Telephone Encounter (Signed)
Will route to pcp 

## 2019-08-30 MED FILL — JANUMET 50-500 MG TABLET: 50-500 | 30 days supply | Qty: 60 | Fill #5

## 2019-08-31 DIAGNOSIS — M6281 Muscle weakness (generalized): Secondary | ICD-10-CM | POA: Diagnosis not present

## 2019-08-31 DIAGNOSIS — M25312 Other instability, left shoulder: Secondary | ICD-10-CM | POA: Diagnosis not present

## 2019-08-31 DIAGNOSIS — M25512 Pain in left shoulder: Secondary | ICD-10-CM | POA: Diagnosis not present

## 2019-08-31 NOTE — Telephone Encounter (Signed)
Called patient at [phone] / unable to leave message/ sent text notification.

## 2019-09-05 ENCOUNTER — Other Ambulatory Visit: Payer: Self-pay | Admitting: Internal Medicine

## 2019-09-06 DIAGNOSIS — F3132 Bipolar disorder, current episode depressed, moderate: Secondary | ICD-10-CM | POA: Diagnosis not present

## 2019-09-08 DIAGNOSIS — M25512 Pain in left shoulder: Secondary | ICD-10-CM | POA: Diagnosis not present

## 2019-09-08 DIAGNOSIS — M6281 Muscle weakness (generalized): Secondary | ICD-10-CM | POA: Diagnosis not present

## 2019-09-08 DIAGNOSIS — Z9889 Other specified postprocedural states: Secondary | ICD-10-CM | POA: Diagnosis not present

## 2019-09-08 DIAGNOSIS — M25312 Other instability, left shoulder: Secondary | ICD-10-CM | POA: Diagnosis not present

## 2019-09-13 DIAGNOSIS — M545 Low back pain: Secondary | ICD-10-CM | POA: Diagnosis not present

## 2019-09-13 DIAGNOSIS — M25519 Pain in unspecified shoulder: Secondary | ICD-10-CM | POA: Diagnosis not present

## 2019-09-13 DIAGNOSIS — G4700 Insomnia, unspecified: Secondary | ICD-10-CM | POA: Diagnosis not present

## 2019-09-13 DIAGNOSIS — Z79891 Long term (current) use of opiate analgesic: Secondary | ICD-10-CM | POA: Diagnosis not present

## 2019-09-13 DIAGNOSIS — M5416 Radiculopathy, lumbar region: Secondary | ICD-10-CM | POA: Diagnosis not present

## 2019-09-13 DIAGNOSIS — G894 Chronic pain syndrome: Secondary | ICD-10-CM | POA: Diagnosis not present

## 2019-09-14 DIAGNOSIS — M25312 Other instability, left shoulder: Secondary | ICD-10-CM | POA: Diagnosis not present

## 2019-09-14 DIAGNOSIS — Z9889 Other specified postprocedural states: Secondary | ICD-10-CM | POA: Diagnosis not present

## 2019-09-14 DIAGNOSIS — M6281 Muscle weakness (generalized): Secondary | ICD-10-CM | POA: Diagnosis not present

## 2019-09-14 DIAGNOSIS — M25512 Pain in left shoulder: Secondary | ICD-10-CM | POA: Diagnosis not present

## 2019-09-16 ENCOUNTER — Telehealth: Payer: Self-pay | Admitting: Internal Medicine

## 2019-09-16 DIAGNOSIS — F3132 Bipolar disorder, current episode depressed, moderate: Secondary | ICD-10-CM | POA: Diagnosis not present

## 2019-09-16 DIAGNOSIS — F3181 Bipolar II disorder: Secondary | ICD-10-CM | POA: Diagnosis not present

## 2019-09-16 DIAGNOSIS — F411 Generalized anxiety disorder: Secondary | ICD-10-CM | POA: Diagnosis not present

## 2019-09-16 DIAGNOSIS — F902 Attention-deficit hyperactivity disorder, combined type: Secondary | ICD-10-CM | POA: Diagnosis not present

## 2019-09-16 NOTE — Telephone Encounter (Signed)
gemfibrozil (LOPID) 600 MG tablet [id number]     Patient is requesting to increasing the medication. Please call her at [phone].

## 2019-09-17 ENCOUNTER — Telehealth: Payer: Self-pay

## 2019-09-17 DIAGNOSIS — Z9889 Other specified postprocedural states: Secondary | ICD-10-CM | POA: Diagnosis not present

## 2019-09-17 DIAGNOSIS — M6281 Muscle weakness (generalized): Secondary | ICD-10-CM | POA: Diagnosis not present

## 2019-09-17 DIAGNOSIS — M25312 Other instability, left shoulder: Secondary | ICD-10-CM | POA: Diagnosis not present

## 2019-09-17 DIAGNOSIS — E119 Type 2 diabetes mellitus without complications: Secondary | ICD-10-CM

## 2019-09-17 DIAGNOSIS — M25512 Pain in left shoulder: Secondary | ICD-10-CM | POA: Diagnosis not present

## 2019-09-17 NOTE — Telephone Encounter (Signed)
Pt has an appointment schedule with pcp on 3/25

## 2019-09-17 NOTE — Telephone Encounter (Signed)
Pt called stating since she started( ADDERALL) her sugar readings has been going up, readings between 200-300. Asked if she could increase her Janumet dose?Please advice!

## 2019-09-17 NOTE — Telephone Encounter (Signed)
Pt states she doesn't want to her Gemfibrozil increased pt is asking for her Janumet MG to be increased.

## 2019-09-18 MED ORDER — JANUMET 50-1000 MG PO TABS: 1.0000 | ORAL_TABLET | Freq: Two times a day (BID) | ORAL | 3 refills | Status: DC

## 2019-09-20 ENCOUNTER — Ambulatory Visit: Payer: Medicare Other | Admitting: Internal Medicine

## 2019-09-20 ENCOUNTER — Observation Stay (HOSPITAL_COMMUNITY)
Admission: RE | Admit: 2019-09-20 | Discharge: 2019-09-21 | Disposition: A | Discharge status: Home / Self Care | Payer: Medicare HMO | Attending: Psychiatry | Admitting: Psychiatry

## 2019-09-20 DIAGNOSIS — E781 Pure hyperglyceridemia: Secondary | ICD-10-CM | POA: Insufficient documentation

## 2019-09-20 DIAGNOSIS — R45851 Suicidal ideations: Secondary | ICD-10-CM | POA: Diagnosis not present

## 2019-09-20 DIAGNOSIS — Z79899 Other long term (current) drug therapy: Secondary | ICD-10-CM | POA: Insufficient documentation

## 2019-09-20 DIAGNOSIS — Z915 Personal history of self-harm: Secondary | ICD-10-CM | POA: Insufficient documentation

## 2019-09-20 DIAGNOSIS — Z20822 Contact with and (suspected) exposure to covid-19: Secondary | ICD-10-CM | POA: Diagnosis not present

## 2019-09-20 DIAGNOSIS — F419 Anxiety disorder, unspecified: Secondary | ICD-10-CM | POA: Insufficient documentation

## 2019-09-20 DIAGNOSIS — E7800 Pure hypercholesterolemia, unspecified: Secondary | ICD-10-CM | POA: Insufficient documentation

## 2019-09-20 DIAGNOSIS — F4325 Adjustment disorder with mixed disturbance of emotions and conduct: Secondary | ICD-10-CM | POA: Insufficient documentation

## 2019-09-20 DIAGNOSIS — F329 Major depressive disorder, single episode, unspecified: Secondary | ICD-10-CM | POA: Diagnosis not present

## 2019-09-20 DIAGNOSIS — E119 Type 2 diabetes mellitus without complications: Secondary | ICD-10-CM | POA: Diagnosis not present

## 2019-09-20 DIAGNOSIS — I10 Essential (primary) hypertension: Secondary | ICD-10-CM | POA: Diagnosis not present

## 2019-09-20 DIAGNOSIS — Z7984 Long term (current) use of oral hypoglycemic drugs: Secondary | ICD-10-CM | POA: Diagnosis not present

## 2019-09-20 MED ORDER — ACETAMINOPHEN 325 MG PO TABS: 650.0000 mg | ORAL_TABLET | Freq: Four times a day (QID) | ORAL | Status: DC | PRN

## 2019-09-20 MED ORDER — ALUM & MAG HYDROXIDE-SIMETH 200-200-20 MG/5ML PO SUSP: 30.0000 mL | ORAL | Status: DC | PRN

## 2019-09-20 MED ORDER — TRAZODONE HCL 50 MG PO TABS
50.0000 mg | ORAL_TABLET | Freq: Every day | ORAL | Status: DC
  Administered 2019-09-20: 50 mg via ORAL
  Filled 2019-09-20 (×3): qty 1

## 2019-09-20 MED ORDER — HYDROXYZINE HCL 25 MG PO TABS
25.0000 mg | ORAL_TABLET | Freq: Three times a day (TID) | ORAL | Status: DC | PRN
  Administered 2019-09-20: 25 mg via ORAL
  Filled 2019-09-20: qty 1

## 2019-09-20 MED ORDER — MAGNESIUM HYDROXIDE 400 MG/5ML PO SUSP: 30.0000 mL | Freq: Every day | ORAL | Status: DC | PRN

## 2019-09-20 NOTE — BH Assessment (Signed)
Assessment Note  Amy Glass is an 59 y.o. female presenting as walk-in to [facility] under IVC due SI according to IVC. Patient denied allegations on IVC. Patient denied SI, HI and psychosis. Patient stated she called her therapist to say she had car trouble and to see if she can do her therapy session over the phone since they were talking on the phone, the therapist said no and told patient that she would be terminated as she signed paperwork that stated after 1 missed session that she would be terminated from the counseling process, patient then made an exaggerated statement, "wow, I see how people are dead, nice knowing you". Patient then stated she was in her house watching a movie when the police showed up and asked her questions, she answered and they left just to return again to bring her to [facility]. Patient stated the officers asked her what are some ways to kill yourself, and patient listed multiple. Patient reported she is divorced and resides with her daughter. Patient reported childhood traumas, good appetite and 8 hours nightly sleep.  Patient is currently being seen at the [facility] for medication management. Patient reports consistently taking her medications. Patient was calm and cooperative during the assessment.  PER IVC: Respondent is on medication and takes it on a regular basis. Respondent was committed in the 2000's. Respondent reported experiencing suicidal thoughts with pan to overdose on prescribed medication. Respondent has a history of suicide attempts.   Diagnosis: Major depressive disorder  Past Medical History:  Past Medical History:  Diagnosis Date  . Allergy   . Anxiety    as child   . Depression    as child  . Diabetes mellitus without complication ([place]) Dx 2423  . Diverticulosis   . Hiatal hernia   . Hyperplastic colon polyp   . Hypertension Dx 2003  . Schatzki's ring     Past Surgical History:  Procedure Laterality Date  . BACK SURGERY   2002   lumbar  . BROW LIFT Bilateral 09/17/2016   Procedure: BLEPHAROPLASTY;  Surgeon: Irene Limbo, MD;  Location: [facility];  Service: Plastics;  Laterality: Bilateral;  . CARPAL TUNNEL RELEASE Right   . CARPAL TUNNEL RELEASE Left 06/2016  . CESAREAN SECTION  05/20/2003   . ESOPHAGEAL MANOMETRY N/A 10/25/2015   Procedure: ESOPHAGEAL MANOMETRY (EM);  Surgeon: Jerene Bears, MD;  Location: [facility];  Service: Gastroenterology;  Laterality: N/A;  . PTOSIS REPAIR Bilateral 09/17/2016   Procedure: BILATERAL PTOSIS REPAIR EYELID WITH SUTURE TECHNIQUE, BILATERAL UPPER LID BLEPHAROPLASTY WITH EXCESS SKIN WEIGHING EYELID DOWN.;  Surgeon: Irene Limbo, MD;  Location: [facility];  Service: Plastics;  Laterality: Bilateral;  . SHOULDER SURGERY  08/2013    b/l shoulder   . SHOULDER SURGERY Right 11/2015    Family History:  Family History  Problem Relation Age of Onset  . Diabetes Mother   . Heart disease Mother   . Hyperlipidemia Mother   . Hypertension Mother   . Renal cancer Maternal Aunt   . Stomach cancer Maternal Uncle   . Colon cancer Neg Hx   . Esophageal cancer Neg Hx   . Rectal cancer Neg Hx     Social History:  reports that she has never smoked. She has never used smokeless tobacco. She reports that she does not drink alcohol or use drugs.  Additional Social History:  Alcohol / Drug Use Pain Medications: see MAR Prescriptions: see MAR Over the Counter:  see MAR  CIWA:   COWS:    Allergies:  Allergies  Allergen Reactions  . Nitroglycerin Other (See Comments)    Migraines, (only tried nitro patch. Has never tried the pills)  . Tricor [Fenofibrate]     Stomach upset    Home Medications:  Medications Prior to Admission  Medication Sig Dispense Refill  . ACCU-CHEK FASTCLIX LANCETS MISC Use as directed to test blood sugar once daily 100 each 12  . albuterol (VENTOLIN HFA) 108 (90 Base) MCG/ACT inhaler TAKE 2 PUFFS BY MOUTH EVERY 6  HOURS AS NEEDED FOR WHEEZE OR SHORTNESS OF BREATH 18 g 0  . amphetamine-dextroamphetamine (ADDERALL) 30 MG tablet Take 30 mg by mouth 2 (two) times a day.    . benzonatate (TESSALON) 100 MG capsule Take 1 capsule (100 mg total) by mouth 3 (three) times daily as needed for cough. 30 capsule 0  . Blood Glucose Monitoring Suppl (ACCU-CHEK GUIDE) w/Device KIT 1 each by Does not apply route daily. Use as directed to test blood sugar once daily 1 kit 0  . eszopiclone (LUNESTA) 2 MG TABS tablet Take 2 mg by mouth at bedtime as needed for sleep.     . famotidine (PEPCID) 20 MG tablet Take 1 tablet (20 mg total) by mouth at bedtime. (Patient not taking: Reported on 02/12/2019) 30 tablet 5  . fluticasone (FLONASE) 50 MCG/ACT nasal spray Place 1 spray into both nostrils daily. 16 g 2  . gemfibrozil (LOPID) 600 MG tablet Take 1 tablet (600 mg total) by mouth 2 (two) times daily before a meal. 60 tablet 2  . glucose blood (ACCU-CHEK GUIDE) test strip Use as directed to test blood sugar once daily 100 each 12  . ketotifen (ZADITOR) 0.025 % ophthalmic solution Place 1 drop into both eyes 2 (two) times daily. (Patient not taking: Reported on 02/12/2019) 10 mL 1  . losartan (COZAAR) 100 MG tablet Take 1 tablet (100 mg total) by mouth daily. 30 tablet 6  . omeprazole (PRILOSEC) 40 MG capsule TAKE 1 CAPSULE BY MOUTH EVERY MORNING. TAKE 30-60 MINUTES BEFORE BREAKFAST 30 capsule 5  . PARoxetine (PAXIL) 10 MG tablet Take 10 mg by mouth daily.    . sitaGLIPtin-metformin (JANUMET) 50-1000 MG tablet Take 1 tablet by mouth 2 (two) times daily with a meal. 60 tablet 3  . venlafaxine XR (EFFEXOR-XR) 150 MG 24 hr capsule TAKE 1 CAPSULE BY MOUTH EVERY DAY WITH BREAKFAST (Patient taking differently: Take 150 mg by mouth daily with breakfast. ) 90 capsule 0    OB/GYN Status:  Patient's last menstrual period was 05/30/2015.  General Assessment Data Location of Assessment: [facility] Assessment Services TTS Assessment: In system Is this  a Tele or Face-to-Face Assessment?: Face-to-Face Is this an Initial Assessment or a Re-assessment for this encounter?: Initial Assessment Patient Accompanied by:: N/A Language Other than English: No Living Arrangements: Other (Comment)(family home) What gender do you identify as?: Female Marital status: Divorced Living Arrangements: Children Can pt return to current living arrangement?: Yes Admission Status: Involuntary Petitioner: Other(therapist) Is patient capable of signing voluntary admission?: (IVC) Referral Source: Other(therapist)     Crisis Care Plan Living Arrangements: Children Legal Guardian: Other:(self) Name of Psychiatrist: ([place]) Name of Therapist: Maudie Mercury)  Education Status Is patient currently in school?: No Is the patient employed, unemployed or receiving disability?: Employed  Risk to self with the past 6 months Suicidal Ideation: No Has patient been a risk to self within the past 6 months prior to admission? :  No Suicidal Intent: No Has patient had any suicidal intent within the past 6 months prior to admission? : No Is patient at risk for suicide?: No Suicidal Plan?: No Has patient had any suicidal plan within the past 6 months prior to admission? : No Access to Means: No What has been your use of drugs/alcohol within the last 12 months?: (none) Previous Attempts/Gestures: No How many times?: (0) Other Self Harm Risks: (none reported) Triggers for Past Attempts: (n/a) Intentional Self Injurious Behavior: None Family Suicide History: No Recent stressful life event(s): (none) Persecutory voices/beliefs?: No Depression: Yes Depression Symptoms: Tearfulness Substance abuse history and/or treatment for substance abuse?: No Suicide prevention information given to non-admitted patients: Not applicable  Risk to Others within the past 6 months Homicidal Ideation: No Does patient have any lifetime risk of violence toward others beyond the six  months prior to admission? : No Thoughts of Harm to Others: No Current Homicidal Intent: No Current Homicidal Plan: No Access to Homicidal Means: No Identified Victim: (n/a) History of harm to others?: No Assessment of Violence: None Noted Violent Behavior Description: (none reported) Does patient have access to weapons?: No Criminal Charges Pending?: No Does patient have a court date: No Is patient on probation?: No  Psychosis Hallucinations: None noted Delusions: None noted  Mental Status Report Appearance/Hygiene: Unremarkable Eye Contact: Good Motor Activity: Freedom of movement Speech: Logical/coherent Level of Consciousness: Alert Mood: Depressed Affect: Appropriate to circumstance, Depressed Anxiety Level: Minimal Thought Processes: Coherent, Relevant Judgement: Unimpaired Orientation: Person, Place, Time, Situation Obsessive Compulsive Thoughts/Behaviors: None  Cognitive Functioning Concentration: Good Memory: Recent Intact Is patient IDD: No Insight: Fair Impulse Control: Poor Appetite: Good Have you had any weight changes? : No Change Sleep: No Change Total Hours of Sleep: (8) Vegetative Symptoms: None  ADLScreening [facility] Assessment Services) Patient's cognitive ability adequate to safely complete daily activities?: Yes Patient able to express need for assistance with ADLs?: Yes Independently performs ADLs?: Yes (appropriate for developmental age)  Prior Inpatient Therapy Prior Inpatient Therapy: No  Prior Outpatient Therapy Prior Outpatient Therapy: Yes Prior Therapy Dates: (present) Prior Therapy Facilty/Provider(s): ([facility]) Reason for Treatment: (depression) Does patient have an ACCT team?: No Does patient have Intensive In-House Services?  : No Does patient have [facility] services? : No Does patient have P4CC services?: No  ADL Screening (condition at time of admission) Patient's cognitive ability adequate to safely complete  daily activities?: Yes Patient able to express need for assistance with ADLs?: Yes Independently performs ADLs?: Yes (appropriate for developmental age)  Disposition:  Disposition Initial Assessment Completed for this Encounter: Yes  Anette Riedel, NP, recommends overnight observation for safety and stabilization with psych reassessment in the AM.  On Site Evaluation by:   Reviewed with Physician:    Venora Maples 09/20/2019 11:37 PM

## 2019-09-20 NOTE — Telephone Encounter (Signed)
Called pt made aware of MD note . Verbalized understanding

## 2019-09-21 ENCOUNTER — Other Ambulatory Visit: Payer: Self-pay

## 2019-09-21 ENCOUNTER — Encounter (HOSPITAL_COMMUNITY): Payer: Self-pay | Admitting: Psychiatric/Mental Health

## 2019-09-21 DIAGNOSIS — Z79899 Other long term (current) drug therapy: Secondary | ICD-10-CM | POA: Diagnosis not present

## 2019-09-21 DIAGNOSIS — F4325 Adjustment disorder with mixed disturbance of emotions and conduct: Secondary | ICD-10-CM | POA: Diagnosis not present

## 2019-09-21 DIAGNOSIS — R45851 Suicidal ideations: Secondary | ICD-10-CM | POA: Diagnosis not present

## 2019-09-21 DIAGNOSIS — E7800 Pure hypercholesterolemia, unspecified: Secondary | ICD-10-CM | POA: Diagnosis not present

## 2019-09-21 DIAGNOSIS — E119 Type 2 diabetes mellitus without complications: Secondary | ICD-10-CM | POA: Diagnosis not present

## 2019-09-21 DIAGNOSIS — E781 Pure hyperglyceridemia: Secondary | ICD-10-CM | POA: Diagnosis not present

## 2019-09-21 DIAGNOSIS — I10 Essential (primary) hypertension: Secondary | ICD-10-CM | POA: Diagnosis not present

## 2019-09-21 DIAGNOSIS — Z20822 Contact with and (suspected) exposure to covid-19: Secondary | ICD-10-CM | POA: Diagnosis not present

## 2019-09-21 DIAGNOSIS — Z915 Personal history of self-harm: Secondary | ICD-10-CM | POA: Diagnosis not present

## 2019-09-21 LAB — RESPIRATORY PANEL BY RT PCR (FLU A&B, COVID)
Influenza A by PCR: NEGATIVE
Influenza B by PCR: NEGATIVE
SARS Coronavirus 2 by RT PCR: NEGATIVE

## 2019-09-21 LAB — LIPID PANEL
Cholesterol: 243 mg/dL — ABNORMAL HIGH (ref 0–200)
HDL: 35 mg/dL — ABNORMAL LOW (ref >40)
LDL Cholesterol: UNDETERMINED mg/dL (ref 0–99)
Total CHOL/HDL Ratio: 6.9 RATIO
Triglycerides: 610 mg/dL — ABNORMAL HIGH (ref <150)
VLDL: UNDETERMINED mg/dL (ref 0–40)

## 2019-09-21 LAB — RAPID URINE DRUG SCREEN, HOSP PERFORMED
Amphetamines: POSITIVE — AB
Barbiturates: NOT DETECTED
Benzodiazepines: NOT DETECTED
Cocaine: NOT DETECTED
Opiates: NOT DETECTED
Tetrahydrocannabinol: NOT DETECTED

## 2019-09-21 LAB — CBC
HCT: 40.1 % (ref 36.0–46.0)
Hemoglobin: 12.9 g/dL (ref 12.0–15.0)
MCH: 26.7 pg (ref 26.0–34.0)
MCHC: 32.2 g/dL (ref 30.0–36.0)
MCV: 83 fL (ref 80.0–100.0)
Platelets: 272 10*3/uL (ref 150–400)
RBC: 4.83 MIL/uL (ref 3.87–5.11)
RDW: 13 % (ref 11.5–15.5)
WBC: 4.8 10*3/uL (ref 4.0–10.5)
nRBC: 0 % (ref 0.0–0.2)

## 2019-09-21 LAB — HEPATIC FUNCTION PANEL
ALT: 39 U/L (ref 0–44)
AST: 28 U/L (ref 15–41)
Albumin: 4.2 g/dL (ref 3.5–5.0)
Alkaline Phosphatase: 120 U/L (ref 38–126)
Bilirubin, Direct: 0.1 mg/dL (ref 0.0–0.2)
Indirect Bilirubin: 0.5 mg/dL (ref 0.3–0.9)
Total Bilirubin: 0.6 mg/dL (ref 0.3–1.2)
Total Protein: 8 g/dL (ref 6.5–8.1)

## 2019-09-21 LAB — HEMOGLOBIN A1C
Hgb A1c MFr Bld: 9.8 % — ABNORMAL HIGH (ref 4.8–5.6)
Mean Plasma Glucose: 234.56 mg/dL

## 2019-09-21 LAB — GLUCOSE, CAPILLARY: Glucose-Capillary: 227 mg/dL — ABNORMAL HIGH (ref 70–99)

## 2019-09-21 LAB — MAGNESIUM: Magnesium: 1.6 mg/dL — ABNORMAL LOW (ref 1.7–2.4)

## 2019-09-21 LAB — LDL CHOLESTEROL, DIRECT: Direct LDL: 89 mg/dL (ref 0–99)

## 2019-09-21 LAB — PREGNANCY, URINE: Preg Test, Ur: NEGATIVE

## 2019-09-21 MED ORDER — TRAZODONE HCL 50 MG PO TABS: 50.0000 mg | ORAL_TABLET | Freq: Every day | ORAL | 0 refills | Status: DC

## 2019-09-21 NOTE — Plan of Care (Signed)
[facility] Observation Crisis Plan  Reason for Crisis Plan:  Crisis Stabilization   Plan of Care:  Referral for Inpatient Hospitalization  Family Support:      Current Living Environment:  Living Arrangements: Children  Insurance:   Hospital Account    Name Acct ID Class Status Primary Coverage   K[place]lee, Vanderwal [id number] [place] HMO        Guarantor Account (for Hospital Account [id number])    Name Relation to Pt Service Area Active? Acct Type   [place], [place]   Address Phone       [address]  [place], [place] [address] [phone])          Coverage Information (for Hospital Account [id number])    F/O Payor/Plan Precert #   [facility] [place] MEDICARE HMO    Subscriber Subscriber #   Nichell, Cannizzo [id number]   Address Phone   [address] [place] [address] [phone]      Legal Guardian:  Legal Guardian: Other:(self)  Primary Care Provider:  [name], MD  Current Outpatient Providers:  Karle Plumber, MD  Psychiatrist:  Name of Psychiatrist: ([place])  Counselor/Therapist:  Name of Therapist: Maudie Mercury)  Compliant with Medications:  Yes  Additional Information:   Jesusita Oka 3/2/20213:58 AM

## 2019-09-21 NOTE — Progress Notes (Signed)
Patient ID: Amy Glass, female   DOB: 1961-03-01, 59 y.o.   MRN: [id number] Pt A&O x 4, presents under IVC, after stating, nice knowing you, I see how people are dead.  Pt denies SI at present, HI or AVH.  Pt reports she made the wrong statement.  Skin search completed, calm & cooperative, monitoring for safety,, no distress noted.  Pt therapist IVCed her.

## 2019-09-21 NOTE — Progress Notes (Signed)
Pt d/c from the hospital. All items returned. D/C instructions given.

## 2019-09-21 NOTE — Progress Notes (Signed)
   09/21/19 1100  Psych Admission Type (Psych Patients Only)  Admission Status Involuntary  Psychosocial Assessment  Patient Complaints Anxiety  Eye Contact Fair  Facial Expression Anxious  Affect Appropriate to circumstance  Speech Logical/coherent  Interaction Assertive  Motor Activity Other (Comment) (WDL)  Appearance/Hygiene Unremarkable  Behavior Characteristics Cooperative  Mood Anxious  Thought Process  Coherency WDL  Content WDL  Delusions None reported or observed  Perception WDL  Hallucination None reported or observed  Judgment Poor  Confusion None  Danger to Self  Current suicidal ideation? Denies  Danger to Others  Danger to Others None reported or observed

## 2019-09-21 NOTE — BH Assessment (Signed)
[facility] Assessment Progress Note  Per Neita Garnet, MD, this pt does not require psychiatric hospitalization at this time.  Pt presents under IVC initiate by a crisis team counselor, which has been rescinded by Dr Parke Poisson.  Pt is to be discharged from the [facility] Observation Unit with recommendation to continue treatment with her current outpatient provider at the [facility].  This has been included in pt's discharge instructions.  Pt's nurse, Jan, has been notified.  Jalene Mullet, [place] Triage Specialist [phone]

## 2019-09-21 NOTE — Progress Notes (Signed)
Patient ID: Amy Glass, female   DOB: 27-Nov-1960, 59 y.o.   MRN: [id number] 09/21/2019 at 9:50 AM  Observation unit progress note  Patient is a 59 year old female, presented to [facility] under IVC.  As per IVC  she reported experiencing suicidal ideations with thoughts of overdosing. Patient reports she had contacted her therapist to inform her that she would have difficulty keeping appointment due to car trouble.  She states that therapist told her that according to policy and paperwork she had signed she was being terminated  due to noncompliance.  Patient states she became acutely upset/angry and states "I just said something stupid that I did not mean".  States she  said something like " see how people are dead, its been nice knowing you, better dead than alive".  States these comments were made in anger and frustration and that she did not actually have any suicidal plans or intentions. States that after these statements she felt calmer and better after a brief period of time but that " police came and brought me here". She or significant neurovegetative symptoms leading up to this admission.  States "I have been doing okay".  She denies having had any suicidal ideations recently or leading up to this admission.  She denies history of prior psychiatric admissions.  She reports a history of depression and anxiety.  She reports a suicide attempt many years ago while in high school.  She reports she has been stable on her current medications which as per home med list are Abilify, Seroquel, Elavil.   Currently presents alert, attentive, calm, pleasant on approach.  Mood is "all right today" and at this time appears euthymic with an appropriate/full range of affect.  Smiles and even laughs appropriately during our session.  There is no thought disorder.  No hallucinations, no delusions are expressed and does not appear internally preoccupied.  Denies suicidal ideations and as above states that the  remarks made yesterday were made in anger and out of frustration rather than due to any actual intent.  No homicidal ideations.  Oriented x3.  Currently future oriented and looking forward to reunite with her daughter, with whom she lives and to see her pets.   With her expressed consent I spoke with her brother Valarie Merino [id number].  He reports that he visits patient  daily and that she has been "doing pretty good".  He has not noticed any recent worsening symptoms or depression and states that she has been doing well.  He is in agreement that above events occurred in the context of "having a bad moment" but that she is " doing okay".  Behavior on unit is calm and in good control.  She is polite, pleasant on approach.  Dx- Depression,consider MDD, by history  Plan-based on above assessment and current presentation there are no current grounds for involuntary commitment.  Patient is hoping for discharge and is looking forward to seeing her daughter, brother, pets.  She plans to follow-up with her PCP for further medical care and medication management.  I have reviewed with her hypercholesterolemia/hypertriglyceridemia findings and encouraged her to discuss with PCP She will be released from IVC, pending discharge. CSW working on referral/disposition options.   Gabriel Earing MD

## 2019-09-21 NOTE — Discharge Summary (Signed)
[facility] Psych Observation Discharge  09/21/2019 11:10 AM Amy Glass  MRN:  [id number] Principal Problem: <principal problem not specified> Discharge Diagnoses: Active Problems:   Suicidal ideation   Subjective: Per Dr. Parke Poisson Assessment:  Patient is a 59 year old female, presented to [facility] under IVC.  As per IVC  she reported experiencing suicidal ideations with thoughts of overdosing. Patient reports she had contacted her therapist to inform her that she would have difficulty keeping appointment due to car trouble.  She states that therapist told her that according to policy and paperwork she had signed she was being terminated  due to noncompliance.  Patient states she became acutely upset/angry and states "I just said something stupid that I did not mean".  States she  said something like " see how people are dead, its been nice knowing you, better dead than alive".  States these comments were made in anger and frustration and that she did not actually have any suicidal plans or intentions. States that after these statements she felt calmer and better after a brief period of time but that " police came and brought me here". She or significant neurovegetative symptoms leading up to this admission.  States "I have been doing okay".  She denies having had any suicidal ideations recently or leading up to this admission.  She denies history of prior psychiatric admissions.  She reports a history of depression and anxiety.  She reports a suicide attempt many years ago while in high school.  She reports she has been stable on her current medications which as per home med list are Abilify, Seroquel, Elavil.   Currently presents alert, attentive, calm, pleasant on approach.  Mood is "all right today" and at this time appears euthymic with an appropriate/full range of affect.  Smiles and even laughs appropriately during our session.  There is no thought disorder.  No hallucinations, no delusions are  expressed and does not appear internally preoccupied.  Denies suicidal ideations and as above states that the remarks made yesterday were made in anger and out of frustration rather than due to any actual intent.  No homicidal ideations.  Oriented x3.  Currently future oriented and looking forward to reunite with her daughter, with whom she lives and to see her pets.   With her expressed consent I spoke with her brother Valarie Merino [phone].  He reports that he visits patient  daily and that she has been "doing pretty good".  He has not noticed any recent worsening symptoms or depression and states that she has been doing well.  He is in agreement that above events occurred in the context of "having a bad moment" but that she is " doing okay".  Behavior on unit is calm and in good control.  She is polite, pleasant on approach.  Dx- Depression,consider MDD, by history  Plan-based on above assessment and current presentation there are no current grounds for involuntary commitment.  Patient is hoping for discharge and is looking forward to seeing her daughter, brother, pets.  She plans to follow-up with her PCP for further medical care and medication management.  I have reviewed with her hypercholesterolemia/hypertriglyceridemia findings and encouraged her to discuss with PCP She will be released from IVC, pending discharge. CSW working on referral/disposition options.  Total Time spent with patient: 30 minutes  Past Psychiatric History: See above  Past Medical History:  Past Medical History:  Diagnosis Date  . Allergy   . Anxiety    as child   .  Depression    as child  . Diabetes mellitus without complication ([place]) Dx 1610  . Diverticulosis   . Hiatal hernia   . Hyperplastic colon polyp   . Hypertension Dx 2003  . Schatzki's ring     Past Surgical History:  Procedure Laterality Date  . BACK SURGERY  2002   lumbar  . BROW LIFT Bilateral 09/17/2016   Procedure: BLEPHAROPLASTY;   Surgeon: Irene Limbo, MD;  Location: [place];  Service: Plastics;  Laterality: Bilateral;  . CARPAL TUNNEL RELEASE Right   . CARPAL TUNNEL RELEASE Left 06/2016  . CESAREAN SECTION  05/20/2003   . ESOPHAGEAL MANOMETRY N/A 10/25/2015   Procedure: ESOPHAGEAL MANOMETRY (EM);  Surgeon: Jerene Bears, MD;  Location: [facility];  Service: Gastroenterology;  Laterality: N/A;  . PTOSIS REPAIR Bilateral 09/17/2016   Procedure: BILATERAL PTOSIS REPAIR EYELID WITH SUTURE TECHNIQUE, BILATERAL UPPER LID BLEPHAROPLASTY WITH EXCESS SKIN WEIGHING EYELID DOWN.;  Surgeon: Irene Limbo, MD;  Location: [place];  Service: Plastics;  Laterality: Bilateral;  . SHOULDER SURGERY  08/2013    b/l shoulder   . SHOULDER SURGERY Right 11/2015   Family History:  Family History  Problem Relation Age of Onset  . Diabetes Mother   . Heart disease Mother   . Hyperlipidemia Mother   . Hypertension Mother   . Renal cancer Maternal Aunt   . Stomach cancer Maternal Uncle   . Colon cancer Neg Hx   . Esophageal cancer Neg Hx   . Rectal cancer Neg Hx    Family Psychiatric  History: unaware Social History:  Social History   Substance and Sexual Activity  Alcohol Use No  . Alcohol/week: 0.0 standard drinks     Social History   Substance and Sexual Activity  Drug Use No    Social History   Socioeconomic History  . Marital status: Married    Spouse name: Not on file  . Number of children: Not on file  . Years of education: Not on file  . Highest education level: Not on file  Occupational History  . Occupation: Scientist, water quality  Tobacco Use  . Smoking status: Never Smoker  . Smokeless tobacco: Never Used  Substance and Sexual Activity  . Alcohol use: No    Alcohol/week: 0.0 standard drinks  . Drug use: No  . Sexual activity: Yes  Other Topics Concern  . Not on file  Social History Narrative  . Not on file   Social Determinants of Health   Financial Resource Strain:    . Difficulty of Paying Living Expenses: Not on file  Food Insecurity:   . Worried About Charity fundraiser in the Last Year: Not on file  . Ran Out of Food in the Last Year: Not on file  Transportation Needs:   . Lack of Transportation (Medical): Not on file  . Lack of Transportation (Non-Medical): Not on file  Physical Activity:   . Days of Exercise per Week: Not on file  . Minutes of Exercise per Session: Not on file  Stress:   . Feeling of Stress : Not on file  Social Connections:   . Frequency of Communication with Friends and Family: Not on file  . Frequency of Social Gatherings with Friends and Family: Not on file  . Attends Religious Services: Not on file  . Active Member of Clubs or Organizations: Not on file  . Attends Archivist Meetings: Not on file  . Marital Status: Not on  file    Has this patient used any form of tobacco in the last 30 days? (Cigarettes, Smokeless Tobacco, Cigars, and/or Pipes) Prescription not provided because: Doesn't use tobacco products  Current Medications: Current Facility-Administered Medications  Medication Dose Route Frequency Provider Last Rate Last Admin  . acetaminophen (TYLENOL) tablet 650 mg  650 mg Oral Q6H PRN Dixon, Rashaun M, NP      . alum & mag hydroxide-simeth (MAALOX/MYLANTA) 200-200-20 MG/5ML suspension 30 mL  30 mL Oral Q4H PRN Dixon, Rashaun M, NP      . hydrOXYzine (ATARAX/VISTARIL) tablet 25 mg  25 mg Oral TID PRN Deloria Lair, NP   25 mg at 09/20/19 2343  . magnesium hydroxide (MILK OF MAGNESIA) suspension 30 mL  30 mL Oral Daily PRN Deloria Lair, NP      . traZODone (DESYREL) tablet 50 mg  50 mg Oral QHS Dixon, Rashaun M, NP   50 mg at 09/20/19 2343   PTA Medications: Medications Prior to Admission  Medication Sig Dispense Refill Last Dose  . amphetamine-dextroamphetamine (ADDERALL) 30 MG tablet Take 30 mg by mouth 2 (two) times a day.     . ARIPiprazole (ABILIFY) 10 MG tablet Take 10 mg by mouth  daily.     . hydrOXYzine (VISTARIL) 25 MG capsule Take 25 mg by mouth 3 (three) times daily.     . QUEtiapine (SEROQUEL) 50 MG tablet Take 50 mg by mouth at bedtime.     . sitaGLIPtin-metformin (JANUMET) 50-1000 MG tablet Take 1 tablet by mouth 2 (two) times daily with a meal. 60 tablet 3   . traMADol-acetaminophen (ULTRACET) 37.5-325 MG tablet Take 1 tablet by mouth every 4 (four) hours as needed for pain.     Marland Kitchen ACCU-CHEK FASTCLIX LANCETS MISC Use as directed to test blood sugar once daily 100 each 12 Unknown at Unknown time  . albuterol (VENTOLIN HFA) 108 (90 Base) MCG/ACT inhaler TAKE 2 PUFFS BY MOUTH EVERY 6 HOURS AS NEEDED FOR WHEEZE OR SHORTNESS OF BREATH (Patient not taking: Reported on 09/21/2019) 18 g 0 Not Taking at Unknown time  . amitriptyline (ELAVIL) 25 MG tablet Take 25 mg by mouth at bedtime.     . benzonatate (TESSALON) 100 MG capsule Take 1 capsule (100 mg total) by mouth 3 (three) times daily as needed for cough. (Patient not taking: Reported on 09/21/2019) 30 capsule 0 Not Taking at Unknown time  . Blood Glucose Monitoring Suppl (ACCU-CHEK GUIDE) w/Device KIT 1 each by Does not apply route daily. Use as directed to test blood sugar once daily 1 kit 0 Unknown at Unknown time  . famotidine (PEPCID) 20 MG tablet Take 1 tablet (20 mg total) by mouth at bedtime. (Patient not taking: Reported on 02/12/2019) 30 tablet 5 Not Taking at Unknown time  . fluticasone (FLONASE) 50 MCG/ACT nasal spray Place 1 spray into both nostrils daily. (Patient not taking: Reported on 09/21/2019) 16 g 2 Not Taking at Unknown time  . gemfibrozil (LOPID) 600 MG tablet Take 1 tablet (600 mg total) by mouth 2 (two) times daily before a meal. (Patient not taking: Reported on 09/21/2019) 60 tablet 2 Not Taking at Unknown time  . glucose blood (ACCU-CHEK GUIDE) test strip Use as directed to test blood sugar once daily 100 each 12 Unknown at Unknown time  . ketotifen (ZADITOR) 0.025 % ophthalmic solution Place 1 drop into  both eyes 2 (two) times daily. (Patient not taking: Reported on 02/12/2019) 10 mL 1 Not Taking at  Unknown time  . losartan (COZAAR) 100 MG tablet Take 1 tablet (100 mg total) by mouth daily. 30 tablet 6 Unknown at Unknown time  . omeprazole (PRILOSEC) 40 MG capsule TAKE 1 CAPSULE BY MOUTH EVERY MORNING. TAKE 30-60 MINUTES BEFORE BREAKFAST (Patient not taking: Reported on 09/21/2019) 30 capsule 5 Not Taking at Unknown time  . venlafaxine XR (EFFEXOR-XR) 150 MG 24 hr capsule TAKE 1 CAPSULE BY MOUTH EVERY DAY WITH BREAKFAST (Patient not taking: No sig reported) 90 capsule 0 Not Taking at Unknown time    Musculoskeletal: Strength & Muscle Tone: within normal limits Gait & Station: normal Patient leans: N/A  Psychiatric Specialty Exam: Physical Exam  ROS  Blood pressure 123/88, pulse (!) 106, temperature 98.4 F (36.9 C), temperature source Oral, resp. rate 18, last menstrual period 05/30/2015, SpO2 98 %.There is no height or weight on file to calculate BMI.  General Appearance: Casual  Eye Contact:  Good  Speech:  Clear and Coherent and Normal Rate  Volume:  Normal  Mood:  Euthymic  Affect:  Congruent  Thought Process:  Coherent and Descriptions of Associations: Intact  Orientation:  Full (Time, Place, and Person)  Thought Content:  WDL and Logical  Suicidal Thoughts:  No  Homicidal Thoughts:  No  Memory:  Immediate;   Good Recent;   Good  Judgement:  Intact  Insight:  Present  Psychomotor Activity:  Normal  Concentration:  Concentration: Good and Attention Span: Good  Recall:  Good  Fund of Knowledge:  Good  Language:  Good  Akathisia:  No  Handed:  Right  AIMS (if indicated):     Assets:  Communication Skills Desire for Improvement Resilience Social Support  ADL's:  Intact  Cognition:  WNL  Sleep:        Demographic Factors:  Caucasian  Loss Factors: NA  Historical Factors: Impulsivity  Risk Reduction Factors:   Religious beliefs about death and Positive social  support  Continued Clinical Symptoms:  Alcohol/Substance Abuse/Dependencies Previous Psychiatric Diagnoses and Treatments  Cognitive Features That Contribute To Risk:  None    Suicide Risk:  Minimal: No identifiable suicidal ideation.  Patients presenting with no risk factors but with morbid ruminations; may be classified as minimal risk based on the severity of the depressive symptoms   Plan Of Care/Follow-up recommendations:  Activity:  As tolerated Diet:  Heart healthy    Discharge Instructions     For your behavioral health needs, you are advised to continue with your current outpatient provider at the [facility]:       [facility]      [place], [place] [address]      [phone]       Earleen Newport, NP 09/21/2019, 11:10 AM

## 2019-09-21 NOTE — Discharge Instructions (Signed)
For your behavioral health needs, you are advised to continue with your current outpatient provider at the [facility]:       [facility]      [place], [place] [address]      [phone]

## 2019-09-21 NOTE — H&P (Signed)
[facility] Provider Admission PAA/H&P  Patient Identification: Amy Glass MRN:  [id number] Date of Evaluation:  09/21/2019 Chief Complaint:  Suicidal ideation [R45.851] Principal Diagnosis: <principal problem not specified> Diagnosis:  Active Problems:   Suicidal ideation  History of Present Illness: Per TTS:  Amy Glass is an 59 y.o. female presenting as walk-in to [facility] under IVC due SI according to IVC. Patient denied allegations on IVC. Patient denied SI, HI and psychosis. Patient stated she called her therapist to say she had car trouble and to see if she can do her therapy session over the phone since they were talking on the phone, the therapist said no and told patient that she would be terminated as she signed paperwork that stated after 1 missed session that she would be terminated from the counseling process, patient then made an exaggerated statement, "wow, I see how people are dead, nice knowing you". Patient then stated she was in her house watching a movie when the police showed up and asked her questions, she answered and they left just to return again to bring her to [facility]. Patient stated the officers asked her what are some ways to kill yourself, and patient listed multiple. Patient reported she is divorced and resides with her daughter. Patient reported childhood traumas, good appetite and 8 hours nightly sleep.  Patient is currently being seen at the [facility] for medication management. Patient reports consistently taking her medications. Patient was calm and cooperative during the assessment.  PER IVC: Respondent is on medication and takes it on a regular basis. Respondent was committed in the 2000's. Respondent reported experiencing suicidal thoughts with pan to overdose on prescribed medication. Respondent has a history of suicide attempts. Associated Signs/Symptoms: Depression Symptoms:  NA (Hypo) Manic Symptoms:  na Anxiety Symptoms:   na Psychotic Symptoms:  NA PTSD Symptoms: NA Total Time spent with patient: 1 hour  Past Psychiatric History: Yes  Is the patient at risk to self? No.  Has the patient been a risk to self in the past 6 months? No.  Has the patient been a risk to self within the distant past? No.  Is the patient a risk to others? No.  Has the patient been a risk to others in the past 6 months? No.  Has the patient been a risk to others within the distant past? No.   Prior Inpatient Therapy: Prior Inpatient Therapy: No Prior Outpatient Therapy: Prior Outpatient Therapy: Yes Prior Therapy Dates: (present) Prior Therapy Facilty/Provider(s): ([facility]) Reason for Treatment: (depression) Does patient have an ACCT team?: No Does patient have Intensive In-House Services?  : No Does patient have [facility] services? : No Does patient have P4CC services?: No  Alcohol Screening:   Substance Abuse History in the last 12 months:  No. Consequences of Substance Abuse: NA Previous Psychotropic Medications: No  Psychological Evaluations: Yes  Past Medical History:  Past Medical History:  Diagnosis Date  . Allergy   . Anxiety    as child   . Depression    as child  . Diabetes mellitus without complication ([place]) Dx [id number]  . Diverticulosis   . Hiatal hernia   . Hyperplastic colon polyp   . Hypertension Dx 2003  . Schatzki's ring     Past Surgical History:  Procedure Laterality Date  . BACK SURGERY  2002   lumbar  . BROW LIFT Bilateral 09/17/2016   Procedure: BLEPHAROPLASTY;  Surgeon: Irene Limbo, MD;  Location: [facility];  Service: Plastics;  Laterality: Bilateral;  . CARPAL TUNNEL RELEASE Right   . CARPAL TUNNEL RELEASE Left 06/2016  . CESAREAN SECTION  05/20/2003   . ESOPHAGEAL MANOMETRY N/A 10/25/2015   Procedure: ESOPHAGEAL MANOMETRY (EM);  Surgeon: Jerene Bears, MD;  Location: [facility];  Service: Gastroenterology;  Laterality: N/A;  . PTOSIS REPAIR Bilateral  09/17/2016   Procedure: BILATERAL PTOSIS REPAIR EYELID WITH SUTURE TECHNIQUE, BILATERAL UPPER LID BLEPHAROPLASTY WITH EXCESS SKIN WEIGHING EYELID DOWN.;  Surgeon: Irene Limbo, MD;  Location: [facility];  Service: Plastics;  Laterality: Bilateral;  . SHOULDER SURGERY  08/2013    b/l shoulder   . SHOULDER SURGERY Right 11/2015   Family History:  Family History  Problem Relation Age of Onset  . Diabetes Mother   . Heart disease Mother   . Hyperlipidemia Mother   . Hypertension Mother   . Renal cancer Maternal Aunt   . Stomach cancer Maternal Uncle   . Colon cancer Neg Hx   . Esophageal cancer Neg Hx   . Rectal cancer Neg Hx    Family Psychiatric History: unknown Tobacco Screening:   Social History:  Social History   Substance and Sexual Activity  Alcohol Use No  . Alcohol/week: 0.0 standard drinks     Social History   Substance and Sexual Activity  Drug Use No    Additional Social History: Marital status: Divorced    Pain Medications: see MAR Prescriptions: see MAR Over the Counter: see MAR                    Allergies:   Allergies  Allergen Reactions  . Nitroglycerin Other (See Comments)    Migraines, (only tried nitro patch. Has never tried the pills)  . Tricor [Fenofibrate]     Stomach upset   Lab Results:  Results for orders placed or performed during the hospital encounter of 09/20/19 (from the past 48 hour(s))  Respiratory Panel by RT PCR (Flu A&B, Covid) - Nasopharyngeal Swab     Status: None   Collection Time: 09/20/19 11:13 PM   Specimen: Nasopharyngeal Swab  Result Value Ref Range   SARS Coronavirus 2 by RT PCR NEGATIVE NEGATIVE    Comment: (NOTE) SARS-CoV-2 target nucleic acids are NOT DETECTED. The SARS-CoV-2 RNA is generally detectable in upper respiratoy specimens during the acute phase of infection. The lowest concentration of SARS-CoV-2 viral copies this assay can detect is 131 copies/mL. A negative result does not  preclude SARS-Cov-2 infection and should not be used as the sole basis for treatment or other patient management decisions. A negative result may occur with  improper specimen collection/handling, submission of specimen other than nasopharyngeal swab, presence of viral mutation(s) within the areas targeted by this assay, and inadequate number of viral copies (<131 copies/mL). A negative result must be combined with clinical observations, patient history, and epidemiological information. The expected result is Negative. Fact Sheet for Patients:  PinkCheek.be Fact Sheet for Healthcare Providers:  GravelBags.it This test is not yet ap proved or cleared by the [place] FDA and  has been authorized for detection and/or diagnosis of SARS-CoV-2 by FDA under an Emergency Use Authorization (EUA). This EUA will remain  in effect (meaning this test can be used) for the duration of the COVID-19 declaration under Section 564(b)(1) of the Act, 21 U.S.C. section 360bbb-3(b)(1), unless the authorization is terminated or revoked sooner.    Influenza A by PCR NEGATIVE NEGATIVE   Influenza  B by PCR NEGATIVE NEGATIVE    Comment: (NOTE) The Xpert Xpress SARS-CoV-2/FLU/RSV assay is intended as an aid in  the diagnosis of influenza from Nasopharyngeal swab specimens and  should not be used as a sole basis for treatment. Nasal washings and  aspirates are unacceptable for Xpert Xpress SARS-CoV-2/FLU/RSV  testing. Fact Sheet for Patients: PinkCheek.be Fact Sheet for Healthcare Providers: GravelBags.it This test is not yet approved or cleared by the [place] FDA and  has been authorized for detection and/or diagnosis of SARS-CoV-2 by  FDA under an Emergency Use Authorization (EUA). This EUA will remain  in effect (meaning this test can be used) for the duration of the  Covid-19  declaration under Section 564(b)(1) of the Act, 21  U.S.C. section 360bbb-3(b)(1), unless the authorization is  terminated or revoked. Performed at [facility], [place] [address] [place], [place] [address]     Blood Alcohol level:  No results found for: [facility]  Metabolic Disorder Labs:  Lab Results  Component Value Date   HGBA1C 7.2 (A) 01/28/2019   MPG 140 12/21/2015   No results found for: PROLACTIN Lab Results  Component Value Date   CHOL 299 (H) 01/28/2019   TRIG 1,609 (HH) 01/28/2019   HDL 21 (L) 01/28/2019   CHOLHDL 14.2 (H) 01/28/2019   VLDL NOT CALC 12/21/2015   [place] Comment 01/28/2019   [place] Comment 04/27/2018    Current Medications: Current Facility-Administered Medications  Medication Dose Route Frequency Provider Last Rate Last Admin  . acetaminophen (TYLENOL) tablet 650 mg  650 mg Oral Q6H PRN Kathalene Sporer M, NP      . alum & mag hydroxide-simeth (MAALOX/MYLANTA) 200-200-20 MG/5ML suspension 30 mL  30 mL Oral Q4H PRN Sandrine Bloodsworth M, NP      . hydrOXYzine (ATARAX/VISTARIL) tablet 25 mg  25 mg Oral TID PRN Deloria Lair, NP   25 mg at 09/20/19 2343  . magnesium hydroxide (MILK OF MAGNESIA) suspension 30 mL  30 mL Oral Daily PRN Deloria Lair, NP      . traZODone (DESYREL) tablet 50 mg  50 mg Oral QHS Kasson Lamere M, NP   50 mg at 09/20/19 2343   PTA Medications: Medications Prior to Admission  Medication Sig Dispense Refill Last Dose  . ACCU-CHEK FASTCLIX LANCETS MISC Use as directed to test blood sugar once daily 100 each 12   . albuterol (VENTOLIN HFA) 108 (90 Base) MCG/ACT inhaler TAKE 2 PUFFS BY MOUTH EVERY 6 HOURS AS NEEDED FOR WHEEZE OR SHORTNESS OF BREATH 18 g 0   . amphetamine-dextroamphetamine (ADDERALL) 30 MG tablet Take 30 mg by mouth 2 (two) times a day.     . benzonatate (TESSALON) 100 MG capsule Take 1 capsule (100 mg total) by mouth 3 (three) times daily as needed for cough. 30 capsule 0   . Blood Glucose  Monitoring Suppl (ACCU-CHEK GUIDE) w/Device KIT 1 each by Does not apply route daily. Use as directed to test blood sugar once daily 1 kit 0   . eszopiclone (LUNESTA) 2 MG TABS tablet Take 2 mg by mouth at bedtime as needed for sleep.      . famotidine (PEPCID) 20 MG tablet Take 1 tablet (20 mg total) by mouth at bedtime. (Patient not taking: Reported on 02/12/2019) 30 tablet 5   . fluticasone (FLONASE) 50 MCG/ACT nasal spray Place 1 spray into both nostrils daily. 16 g 2   . gemfibrozil (LOPID) 600 MG tablet Take 1 tablet (600  mg total) by mouth 2 (two) times daily before a meal. 60 tablet 2   . glucose blood (ACCU-CHEK GUIDE) test strip Use as directed to test blood sugar once daily 100 each 12   . ketotifen (ZADITOR) 0.025 % ophthalmic solution Place 1 drop into both eyes 2 (two) times daily. (Patient not taking: Reported on 02/12/2019) 10 mL 1   . losartan (COZAAR) 100 MG tablet Take 1 tablet (100 mg total) by mouth daily. 30 tablet 6   . omeprazole (PRILOSEC) 40 MG capsule TAKE 1 CAPSULE BY MOUTH EVERY MORNING. TAKE 30-60 MINUTES BEFORE BREAKFAST 30 capsule 5   . PARoxetine (PAXIL) 10 MG tablet Take 10 mg by mouth daily.     . sitaGLIPtin-metformin (JANUMET) 50-1000 MG tablet Take 1 tablet by mouth 2 (two) times daily with a meal. 60 tablet 3   . venlafaxine XR (EFFEXOR-XR) 150 MG 24 hr capsule TAKE 1 CAPSULE BY MOUTH EVERY DAY WITH BREAKFAST (Patient taking differently: Take 150 mg by mouth daily with breakfast. ) 90 capsule 0     Musculoskeletal: Strength & Muscle Tone: within normal limits Gait & Station: normal Patient leans: N/A  Psychiatric Specialty Exam: Physical Exam  Nursing note and vitals reviewed. Constitutional: She is oriented to person, place, and time. She appears well-developed.  HENT:  Head: Normocephalic.  Eyes: Pupils are equal, round, and reactive to light.  Respiratory: Effort normal.  Musculoskeletal:        General: Normal range of motion.     Cervical back:  Normal range of motion.  Neurological: She is alert and oriented to person, place, and time.  Skin: Skin is warm and dry.  Psychiatric: She has a normal mood and affect. Her speech is normal and behavior is normal. Judgment and thought content normal. Cognition and memory are normal.    Review of Systems  Psychiatric/Behavioral: Negative.   All other systems reviewed and are negative.   Last menstrual period 05/30/2015.There is no height or weight on file to calculate BMI.  General Appearance: Casual  Eye Contact:  Good  Speech:  Clear and Coherent  Volume:  Normal  Mood:  Euthymic  Affect:  Congruent  Thought Process:  Coherent and Descriptions of Associations: Intact  Orientation:  Full (Time, Place, and Person)  Thought Content:  WDL and Logical  Suicidal Thoughts:  No  Homicidal Thoughts:  No  Memory:  Immediate;   Good  Judgement:  Good  Insight:  Good  Psychomotor Activity:  Normal  Concentration:  Concentration: Good  Recall:  Good  Fund of Knowledge:  Good  Language:  Good  Akathisia:  NA  Handed:  Right  AIMS (if indicated):     Assets:  Communication Skills Desire for Improvement Resilience Social Support  ADL's:  Intact  Cognition:  WNL  Sleep:         Disposition: Discharge in the am    Deloria Lair, NP 3/2/20212:35 AM

## 2019-09-22 ENCOUNTER — Ambulatory Visit: Payer: Medicare Other | Admitting: Pharmacist

## 2019-09-22 DIAGNOSIS — M6281 Muscle weakness (generalized): Secondary | ICD-10-CM | POA: Diagnosis not present

## 2019-09-22 DIAGNOSIS — Z9889 Other specified postprocedural states: Secondary | ICD-10-CM | POA: Diagnosis not present

## 2019-09-22 DIAGNOSIS — M25512 Pain in left shoulder: Secondary | ICD-10-CM | POA: Diagnosis not present

## 2019-09-22 DIAGNOSIS — M25312 Other instability, left shoulder: Secondary | ICD-10-CM | POA: Diagnosis not present

## 2019-09-22 LAB — PROLACTIN: Prolactin: 2.7 ng/mL — ABNORMAL LOW (ref 4.8–23.3)

## 2019-09-24 DIAGNOSIS — M6281 Muscle weakness (generalized): Secondary | ICD-10-CM | POA: Diagnosis not present

## 2019-09-24 DIAGNOSIS — M25512 Pain in left shoulder: Secondary | ICD-10-CM | POA: Diagnosis not present

## 2019-09-24 DIAGNOSIS — Z9889 Other specified postprocedural states: Secondary | ICD-10-CM | POA: Diagnosis not present

## 2019-09-24 DIAGNOSIS — M25312 Other instability, left shoulder: Secondary | ICD-10-CM | POA: Diagnosis not present

## 2019-09-28 ENCOUNTER — Ambulatory Visit: Payer: Medicare HMO | Attending: Internal Medicine | Admitting: Internal Medicine

## 2019-09-28 ENCOUNTER — Ambulatory Visit: Payer: Medicare HMO | Admitting: Pharmacist

## 2019-09-28 ENCOUNTER — Other Ambulatory Visit: Payer: Self-pay

## 2019-09-28 ENCOUNTER — Encounter: Payer: Self-pay | Admitting: Internal Medicine

## 2019-09-28 DIAGNOSIS — E119 Type 2 diabetes mellitus without complications: Secondary | ICD-10-CM | POA: Diagnosis not present

## 2019-09-28 DIAGNOSIS — I10 Essential (primary) hypertension: Secondary | ICD-10-CM

## 2019-09-28 DIAGNOSIS — H9192 Unspecified hearing loss, left ear: Secondary | ICD-10-CM

## 2019-09-28 DIAGNOSIS — E781 Pure hyperglyceridemia: Secondary | ICD-10-CM | POA: Diagnosis not present

## 2019-09-28 DIAGNOSIS — R41840 Attention and concentration deficit: Secondary | ICD-10-CM

## 2019-09-28 MED ORDER — GEMFIBROZIL 600 MG PO TABS: 600.0000 mg | ORAL_TABLET | Freq: Two times a day (BID) | ORAL | 4 refills | Status: DC

## 2019-09-28 MED ORDER — VICTOZA 18 MG/3ML ~~LOC~~ SOPN: PEN_INJECTOR | SUBCUTANEOUS | 3 refills | Status: DC

## 2019-09-28 MED ORDER — LOSARTAN POTASSIUM 100 MG PO TABS: 100.0000 mg | ORAL_TABLET | Freq: Every day | ORAL | 6 refills | Status: DC

## 2019-09-28 NOTE — Progress Notes (Signed)
Pt states she is taking adderall and it makes her sugar goes up and she is requesting a higher dose for Janumet

## 2019-09-28 NOTE — Progress Notes (Signed)
Virtual Visit via Telephone Note Due to current restrictions/limitations of in-office visits due to the COVID-19 pandemic, this scheduled clinical appointment was converted to a telehealth visit  I connected with Amy Glass on 09/28/19 at 3:03 p.m by telephone and verified that I am speaking with the correct person using two identifiers. I am in my office.  The patient is at home.  Only the patient and myself participated in this encounter.  I discussed the limitations, risks, security and privacy concerns of performing an evaluation and management service by telephone and the availability of in person appointments. I also discussed with the patient that there may be a patient responsible charge related to this service. The patient expressed understanding and agreed to proceed.   History of Present Illness: Patient with history of DM, HTN, hyperTG, depression/anxiety, chronic neck pain, elevated TG.  Last seen 01/2020  DIABETES TYPE 2 Last A1C:   Lab Results  Component Value Date   HGBA1C 9.8 (H) 09/21/2019  pt reports BS have been running high since being on Adderall.  She has been on Adderall for 3 mths.  She called 1-2 wks ago requesting higher dose of Janumet.  We increased the dose to 83m/1000 mg BID  Med Adherence:  '[x]'  Yes    Medication side effects:  '[]'  Yes    '[]'  No Home Monitoring?  '[x]'  Yes BID 3 hrs after meals    '[]'  No Home glucose results range: 200-300 Diet Adherence: '[]'  Yes    '[x]'  No - "I can improve."  Exercise: '[x]'  Yes  -does a lot of house work, walks with a friend twice a wk.  Wgh is 144 lbs down 3 lbs from last visit Hypoglycemic episodes?: '[]'  Yes    '[x]'  No Numbness of the feet? '[]'  Yes    '[x]'  No Retinopathy hx? '[]'  Yes    '[]'  No Last eye exam:  Comments:  On Adderall from her psychiatrist to help her concentrate and complete task.  However, needs new psychiatrist because physician has left.  Wants to go to [facility]. She has already called them  and she was told that they will get back to her once they get approval from her insurance.  She is currently on Seroquel 50 mg at bedtime, Abilify 10 mg daily, Adderall and hydroxyzine.  Of note, patient was recently hospitalized as IVC for making threats of suicide to her therapist.  Request referral to ENT to have LT ear checked. Reports decreased hearing in this ear for a number of yrs.   Outpatient Encounter Medications as of 09/28/2019  Medication Sig Note  . amitriptyline (ELAVIL) 25 MG tablet Take 25 mg by mouth at bedtime. 09/21/2019: 90 day supply 06/30/2019 Pt unsure if she is still taking.  .Marland Kitchenamphetamine-dextroamphetamine (ADDERALL) 30 MG tablet Take 30 mg by mouth 2 (two) times daily.   .Marland Kitchengemfibrozil (LOPID) 600 MG tablet Take 1 tablet (600 mg total) by mouth 2 (two) times daily before a meal.   . hydrOXYzine (VISTARIL) 25 MG capsule Take 25 mg by mouth 3 (three) times daily.   .Marland Kitchenlosartan (COZAAR) 100 MG tablet Take 1 tablet (100 mg total) by mouth daily. 09/21/2019: 90 day supply on 07/17/2019 Pt is unsure if she is still taking.  .Marland Kitchenomeprazole (PRILOSEC) 40 MG capsule TAKE 1 CAPSULE BY MOUTH EVERY MORNING. TAKE 30-60 MINUTES BEFORE BREAKFAST   . sitaGLIPtin-metformin (JANUMET) 50-1000 MG tablet Take 1 tablet by mouth 2 (two) times daily with a meal.   .  traZODone (DESYREL) 50 MG tablet Take 1 tablet (50 mg total) by mouth at bedtime.   Marland Kitchen ACCU-CHEK FASTCLIX LANCETS MISC Use as directed to test blood sugar once daily   . Blood Glucose Monitoring Suppl (ACCU-CHEK GUIDE) w/Device KIT 1 each by Does not apply route daily. Use as directed to test blood sugar once daily   . glucose blood (ACCU-CHEK GUIDE) test strip Use as directed to test blood sugar once daily    No facility-administered encounter medications on file as of 09/28/2019.      Observations/Objective: No direct observation done as this was a telephone encounter.  Assessment and Plan: 1. Type 2 diabetes mellitus without  complication, without long-term current use of insulin (HCC) Uncontrolled.  We discussed adding daily Lantus versus Victoza.  Patient was more interested in trying the [place] especially since it can bring about some weight loss.  I went over possible side effects of the medication including nausea and GI upset.  My CMA will schedule an appointment for her to meet with the clinical pharmacist to be taught how to administer Victoza injections - gemfibrozil (LOPID) 600 MG tablet; Take 1 tablet (600 mg total) by mouth 2 (two) times daily before a meal.  Dispense: 60 tablet; Refill: 4 - liraglutide (VICTOZA) 18 MG/3ML SOPN; 0.6 mg subcut daily x 1 wk then increase to 1.2 mg daily  Dispense: 6 mL; Refill: 3  2. Essential hypertension - losartan (COZAAR) 100 MG tablet; Take 1 tablet (100 mg total) by mouth daily.  Dispense: 30 tablet; Refill: 6  3. Hypertriglyceridemia Refill given on Lopid  4. Difficulty concentrating Sounds like she has ADHD.  However patient states that the psychiatrist never gave her a diagnosis.  She is in the process of establishing care with a new psychiatrist for continuation of prescription Adderall and her other psychiatric medications  5. Decreased hearing of left ear Referred to ENT   Follow Up Instructions: 6-8 wks for physical.    I discussed the assessment and treatment plan with the patient. The patient was provided an opportunity to ask questions and all were answered. The patient agreed with the plan and demonstrated an understanding of the instructions.   The patient was advised to call back or seek an in-person evaluation if the symptoms worsen or if the condition fails to improve as anticipated.  I provided 19 minutes of non-face-to-face time during this encounter.   Karle Plumber, MD

## 2019-09-29 ENCOUNTER — Telehealth: Payer: Self-pay

## 2019-09-29 NOTE — Telephone Encounter (Signed)
Contacted pt to schedule an appointment with Amy Glass for this week to teach her how to use Victoza pt didn't answer left a detailed vm informing her to give [place] a call back to schedule

## 2019-09-30 ENCOUNTER — Other Ambulatory Visit: Payer: Self-pay | Admitting: Adult Health

## 2019-09-30 DIAGNOSIS — M25312 Other instability, left shoulder: Secondary | ICD-10-CM | POA: Diagnosis not present

## 2019-09-30 DIAGNOSIS — M25512 Pain in left shoulder: Secondary | ICD-10-CM | POA: Diagnosis not present

## 2019-09-30 DIAGNOSIS — M6281 Muscle weakness (generalized): Secondary | ICD-10-CM | POA: Diagnosis not present

## 2019-09-30 DIAGNOSIS — Z9889 Other specified postprocedural states: Secondary | ICD-10-CM | POA: Diagnosis not present

## 2019-10-01 ENCOUNTER — Other Ambulatory Visit: Payer: Self-pay

## 2019-10-01 ENCOUNTER — Ambulatory Visit: Payer: Medicare HMO | Attending: Internal Medicine | Admitting: Pharmacist

## 2019-10-01 ENCOUNTER — Telehealth: Payer: Self-pay | Admitting: Internal Medicine

## 2019-10-01 DIAGNOSIS — Z7189 Other specified counseling: Secondary | ICD-10-CM

## 2019-10-01 MED ORDER — TRUEPLUS PEN NEEDLES 31G X 6 MM MISC: 1 refills | Status: DC

## 2019-10-01 NOTE — Telephone Encounter (Signed)
Rx sent 

## 2019-10-01 NOTE — Patient Instructions (Addendum)
1. Take Victoza 0.6 mg everyday for one week 2. Then switch to 1.2 mg everyday for one week 3. Then switch to 1.8 mg everyday for the remaining weeks   If you feel you cannot tolerate any of the nausea or vomiting, give [place] a call here at [facility] and we will inform you what to do next.   Thanks!

## 2019-10-01 NOTE — Telephone Encounter (Signed)
Patient called and wanted to inform pcp that she doe not have any needles for the victoza. Please follow up at your earliest convenience.

## 2019-10-01 NOTE — Telephone Encounter (Signed)
Will forward to [facility]

## 2019-10-01 NOTE — Progress Notes (Signed)
Patient was educated on the use of the Victoza pen. Reviewed necessary supplies and operation of the pen. Also reviewed goal blood glucose levels. Patient was able to demonstrate use. All questions and concerns were addressed.  Patient seen with:  Lorel Monaco, PharmD PGY1 Ambulatory Care Resident [place], PharmD, [place] [phone]

## 2019-10-04 ENCOUNTER — Institutional Professional Consult (permissible substitution): Payer: Medicare HMO | Admitting: Licensed Clinical Social Worker

## 2019-10-11 ENCOUNTER — Other Ambulatory Visit: Payer: Self-pay

## 2019-10-11 ENCOUNTER — Encounter (INDEPENDENT_AMBULATORY_CARE_PROVIDER_SITE_OTHER): Payer: Self-pay | Admitting: Otolaryngology

## 2019-10-11 ENCOUNTER — Ambulatory Visit (INDEPENDENT_AMBULATORY_CARE_PROVIDER_SITE_OTHER): Payer: Medicare HMO | Admitting: Otolaryngology

## 2019-10-11 VITALS — Temp 98.1°F

## 2019-10-11 DIAGNOSIS — H6123 Impacted cerumen, bilateral: Secondary | ICD-10-CM

## 2019-10-11 DIAGNOSIS — H903 Sensorineural hearing loss, bilateral: Secondary | ICD-10-CM

## 2019-10-11 NOTE — Progress Notes (Signed)
HPI: Amy Glass is a 59 y.o. female who presents is referred by her PCP for evaluation of hearing.  She states that she first noticed hearing loss in the left ear when she had a wisdom teeth removed in the 90s.  She i has always noticed more hearing loss in the left ear compared to the right.  She can occasionally hear her heartbeat in her ear.  She occasionally has ringing in her ears.  She denies any ear pain and she has had no drainage from the ears..  Past Medical History:  Diagnosis Date  . Allergy   . Anxiety    as child   . Depression    as child  . Diabetes mellitus without complication ([place]) Dx 4656  . Diverticulosis   . Hiatal hernia   . Hyperplastic colon polyp   . Hypertension Dx 2003  . Schatzki's ring    Past Surgical History:  Procedure Laterality Date  . BACK SURGERY  2002   lumbar  . BROW LIFT Bilateral 09/17/2016   Procedure: BLEPHAROPLASTY;  Surgeon: Irene Limbo, MD;  Location: [facility];  Service: Plastics;  Laterality: Bilateral;  . CARPAL TUNNEL RELEASE Right   . CARPAL TUNNEL RELEASE Left 06/2016  . CESAREAN SECTION  05/20/2003   . ESOPHAGEAL MANOMETRY N/A 10/25/2015   Procedure: ESOPHAGEAL MANOMETRY (EM);  Surgeon: Jerene Bears, MD;  Location: [facility];  Service: Gastroenterology;  Laterality: N/A;  . PTOSIS REPAIR Bilateral 09/17/2016   Procedure: BILATERAL PTOSIS REPAIR EYELID WITH SUTURE TECHNIQUE, BILATERAL UPPER LID BLEPHAROPLASTY WITH EXCESS SKIN WEIGHING EYELID DOWN.;  Surgeon: Irene Limbo, MD;  Location: [facility];  Service: Plastics;  Laterality: Bilateral;  . SHOULDER SURGERY  08/2013    b/l shoulder   . SHOULDER SURGERY Right 11/2015   Social History   Socioeconomic History  . Marital status: Married    Spouse name: Not on file  . Number of children: Not on file  . Years of education: Not on file  . Highest education level: Not on file  Occupational History  . Occupation: Scientist, water quality   Tobacco Use  . Smoking status: Never Smoker  . Smokeless tobacco: Never Used  Substance and Sexual Activity  . Alcohol use: No    Alcohol/week: 0.0 standard drinks  . Drug use: No  . Sexual activity: Yes  Other Topics Concern  . Not on file  Social History Narrative  . Not on file   Social Determinants of Health   Financial Resource Strain:   . Difficulty of Paying Living Expenses:   Food Insecurity:   . Worried About Charity fundraiser in the Last Year:   . Arboriculturist in the Last Year:   Transportation Needs:   . Film/video editor (Medical):   Marland Kitchen Lack of Transportation (Non-Medical):   Physical Activity:   . Days of Exercise per Week:   . Minutes of Exercise per Session:   Stress:   . Feeling of Stress :   Social Connections:   . Frequency of Communication with Friends and Family:   . Frequency of Social Gatherings with Friends and Family:   . Attends Religious Services:   . Active Member of Clubs or Organizations:   . Attends Archivist Meetings:   Marland Kitchen Marital Status:    Family History  Problem Relation Age of Onset  . Diabetes Mother   . Heart disease Mother   . Hyperlipidemia Mother   .  Hypertension Mother   . Renal cancer Maternal Aunt   . Stomach cancer Maternal Uncle   . Colon cancer Neg Hx   . Esophageal cancer Neg Hx   . Rectal cancer Neg Hx    Allergies  Allergen Reactions  . Nitroglycerin Other (See Comments)    Migraines, (only tried nitro patch. Has never tried the pills)  . Tricor [Fenofibrate]     Stomach upset   Prior to Admission medications   Medication Sig Start Date End Date Taking? Authorizing Provider  ACCU-CHEK FASTCLIX LANCETS MISC Use as directed to test blood sugar once daily 06/16/18   [name], MD  amitriptyline (ELAVIL) 25 MG tablet Take 25 mg by mouth at bedtime. 06/30/19   [provider]  amphetamine-dextroamphetamine (ADDERALL) 30 MG tablet Take 30 mg by mouth 2 (two) times daily.     [provider]  ARIPiprazole (ABILIFY) 10 MG tablet Take 10 mg by mouth daily.    [provider]  Blood Glucose Monitoring Suppl (ACCU-CHEK GUIDE) w/Device KIT 1 each by Does not apply route daily. Use as directed to test blood sugar once daily 06/16/18   [name], MD  gemfibrozil (LOPID) 600 MG tablet Take 1 tablet (600 mg total) by mouth 2 (two) times daily before a meal. 09/28/19   [name], MD  glucose blood (ACCU-CHEK GUIDE) test strip Use as directed to test blood sugar once daily 06/16/18   [name], MD  hydrOXYzine (VISTARIL) 25 MG capsule Take 25 mg by mouth 3 (three) times daily. 09/16/19   [provider]  Insulin Pen Needle (TRUEPLUS PEN NEEDLES) 31G X 6 MM MISC Use to inject Victoza daily. 10/01/19   [name], MD  liraglutide (VICTOZA) 18 MG/3ML SOPN 0.6 mg subcut daily x 1 wk then increase to 1.2 mg daily 09/28/19   [name], MD  losartan (COZAAR) 100 MG tablet Take 1 tablet (100 mg total) by mouth daily. 09/28/19   [name], MD  omeprazole (PRILOSEC) 40 MG capsule TAKE 1 CAPSULE BY MOUTH EVERY MORNING. TAKE 30-60 MINUTES BEFORE BREAKFAST 07/08/19   Willia Craze, NP  QUEtiapine (SEROQUEL) 50 MG tablet Take 50 mg by mouth at bedtime.    [provider]  sitaGLIPtin-metformin (JANUMET) 50-1000 MG tablet Take 1 tablet by mouth 2 (two) times daily with a meal. 09/18/19   [name], MD  traZODone (DESYREL) 50 MG tablet Take 1 tablet (50 mg total) by mouth at bedtime. 09/21/19   Rankin, Shuvon B, NP     Positive ROS: Otherwise negative.  All other systems have been reviewed and were otherwise negative with the exception of those mentioned in the HPI and as above.  Physical Exam: Constitutional: Alert, well-appearing, no acute distress Ears: External ears without lesions or tenderness.  She had wax buildup in both ears that was cleaned using hydroperoxide and suction.  After cleaning the  ear canals the TMs were clear otherwise with good mobility on pneumatic otoscopy.  On hearing screening with a 512 1024 tuning fork she had mild hearing loss all but more pronounced on the left side.  Auscultation of the ears revealed no objective pulsatile tinnitus. Nasal: External nose without lesions. Septum with mild deformity.  Mild rhinitis..  Both middle meatus regions are clear with no signs of infection. Oral: Lips and gums without lesions. Tongue and palate mucosa without lesions. Posterior oropharynx clear. Neck: No palpable adenopathy or masses Respiratory: Breathing comfortably  Skin: No facial/neck lesions or rash noted.  Cerumen impaction removal  Date/Time: 10/11/2019 4:35 PM Performed by: Rozetta Nunnery, MD Authorized by: Rozetta Nunnery, MD   Consent:    Consent obtained:  Verbal   Consent given by:  Patient   Risks discussed:  Pain and bleeding Procedure details:    Location:  L ear and R ear   Procedure type: curette and suction   Post-procedure details:    Inspection:  TM intact and canal normal   Hearing quality:  Improved   Patient tolerance of procedure:  Tolerated well, no immediate complications Comments:     TMs were clear bilaterally    Assessment: Cerumen buildup bilaterally. Bilateral sensorineural hearing loss more pronounced on the left side  Plan: Unfortunately patient was not scheduled for hearing test today.  We will plan on getting her scheduled for a hearing test and have her follow-up following the hearing test.   Radene Journey, MD   CC:

## 2019-10-14 ENCOUNTER — Ambulatory Visit: Payer: Medicare HMO | Admitting: Physician Assistant

## 2019-10-14 ENCOUNTER — Ambulatory Visit: Payer: Medicare Other | Admitting: Internal Medicine

## 2019-10-15 DIAGNOSIS — M25519 Pain in unspecified shoulder: Secondary | ICD-10-CM | POA: Diagnosis not present

## 2019-10-15 DIAGNOSIS — M5416 Radiculopathy, lumbar region: Secondary | ICD-10-CM | POA: Diagnosis not present

## 2019-10-15 DIAGNOSIS — Z79891 Long term (current) use of opiate analgesic: Secondary | ICD-10-CM | POA: Diagnosis not present

## 2019-10-15 DIAGNOSIS — G4700 Insomnia, unspecified: Secondary | ICD-10-CM | POA: Diagnosis not present

## 2019-10-15 DIAGNOSIS — G894 Chronic pain syndrome: Secondary | ICD-10-CM | POA: Diagnosis not present

## 2019-10-15 DIAGNOSIS — M545 Low back pain: Secondary | ICD-10-CM | POA: Diagnosis not present

## 2019-10-20 ENCOUNTER — Ambulatory Visit: Payer: Medicare HMO | Admitting: Physician Assistant

## 2019-10-25 ENCOUNTER — Ambulatory Visit (INDEPENDENT_AMBULATORY_CARE_PROVIDER_SITE_OTHER): Payer: Medicare HMO | Admitting: Otolaryngology

## 2019-10-25 ENCOUNTER — Other Ambulatory Visit: Payer: Self-pay

## 2019-10-25 VITALS — Temp 96.4°F

## 2019-10-25 DIAGNOSIS — H903 Sensorineural hearing loss, bilateral: Secondary | ICD-10-CM

## 2019-10-25 NOTE — Progress Notes (Signed)
HPI: Amy Glass is a 59 y.o. female who returns today for evaluation of hearing.  She feels like she hears okay.  She brings whether an audiogram today that demonstrated a mild to moderate right ear upper frequency sensorineural hearing loss at 3000 and above with the left ear upper frequency sensorineural hearing loss at 6000 and above.  She had excellent word recognition bilaterally above 90%.  SRT's were not recorded..  Past Medical History:  Diagnosis Date  . Allergy   . Anxiety    as child   . Depression    as child  . Diabetes mellitus without complication ([place]) Dx 8563  . Diverticulosis   . Hiatal hernia   . Hyperplastic colon polyp   . Hypertension Dx 2003  . Schatzki's ring    Past Surgical History:  Procedure Laterality Date  . BACK SURGERY  2002   lumbar  . BROW LIFT Bilateral 09/17/2016   Procedure: BLEPHAROPLASTY;  Surgeon: Irene Limbo, MD;  Location: [facility];  Service: Plastics;  Laterality: Bilateral;  . CARPAL TUNNEL RELEASE Right   . CARPAL TUNNEL RELEASE Left 06/2016  . CESAREAN SECTION  05/20/2003   . ESOPHAGEAL MANOMETRY N/A 10/25/2015   Procedure: ESOPHAGEAL MANOMETRY (EM);  Surgeon: Jerene Bears, MD;  Location: [facility];  Service: Gastroenterology;  Laterality: N/A;  . PTOSIS REPAIR Bilateral 09/17/2016   Procedure: BILATERAL PTOSIS REPAIR EYELID WITH SUTURE TECHNIQUE, BILATERAL UPPER LID BLEPHAROPLASTY WITH EXCESS SKIN WEIGHING EYELID DOWN.;  Surgeon: Irene Limbo, MD;  Location: [facility];  Service: Plastics;  Laterality: Bilateral;  . SHOULDER SURGERY  08/2013    b/l shoulder   . SHOULDER SURGERY Right 11/2015   Social History   Socioeconomic History  . Marital status: Married    Spouse name: Not on file  . Number of children: Not on file  . Years of education: Not on file  . Highest education level: Not on file  Occupational History  . Occupation: Scientist, water quality  Tobacco Use  . Smoking status: Never  Smoker  . Smokeless tobacco: Never Used  Substance and Sexual Activity  . Alcohol use: No    Alcohol/week: 0.0 standard drinks  . Drug use: No  . Sexual activity: Yes  Other Topics Concern  . Not on file  Social History Narrative  . Not on file   Social Determinants of Health   Financial Resource Strain:   . Difficulty of Paying Living Expenses:   Food Insecurity:   . Worried About Charity fundraiser in the Last Year:   . Arboriculturist in the Last Year:   Transportation Needs:   . Film/video editor (Medical):   Marland Kitchen Lack of Transportation (Non-Medical):   Physical Activity:   . Days of Exercise per Week:   . Minutes of Exercise per Session:   Stress:   . Feeling of Stress :   Social Connections:   . Frequency of Communication with Friends and Family:   . Frequency of Social Gatherings with Friends and Family:   . Attends Religious Services:   . Active Member of Clubs or Organizations:   . Attends Archivist Meetings:   Marland Kitchen Marital Status:    Family History  Problem Relation Age of Onset  . Diabetes Mother   . Heart disease Mother   . Hyperlipidemia Mother   . Hypertension Mother   . Renal cancer Maternal Aunt   . Stomach cancer Maternal Uncle   .  Colon cancer Neg Hx   . Esophageal cancer Neg Hx   . Rectal cancer Neg Hx    Allergies  Allergen Reactions  . Nitroglycerin Other (See Comments)    Migraines, (only tried nitro patch. Has never tried the pills)  . Tricor [Fenofibrate]     Stomach upset   Prior to Admission medications   Medication Sig Start Date End Date Taking? Authorizing Provider  ACCU-CHEK FASTCLIX LANCETS MISC Use as directed to test blood sugar once daily 06/16/18  Yes [name], MD  amitriptyline (ELAVIL) 25 MG tablet Take 25 mg by mouth at bedtime. 06/30/19  Yes [provider]  amphetamine-dextroamphetamine (ADDERALL) 30 MG tablet Take 30 mg by mouth 2 (two) times daily.   Yes [provider]   ARIPiprazole (ABILIFY) 10 MG tablet Take 10 mg by mouth daily.   Yes [provider]  Blood Glucose Monitoring Suppl (ACCU-CHEK GUIDE) w/Device KIT 1 each by Does not apply route daily. Use as directed to test blood sugar once daily 06/16/18  Yes [name], MD  gemfibrozil (LOPID) 600 MG tablet Take 1 tablet (600 mg total) by mouth 2 (two) times daily before a meal. 09/28/19  Yes [name], MD  glucose blood (ACCU-CHEK GUIDE) test strip Use as directed to test blood sugar once daily 06/16/18  Yes [name], MD  hydrOXYzine (VISTARIL) 25 MG capsule Take 25 mg by mouth 3 (three) times daily. 09/16/19  Yes [provider]  Insulin Pen Needle (TRUEPLUS PEN NEEDLES) 31G X 6 MM MISC Use to inject Victoza daily. 10/01/19  Yes [name], MD  liraglutide (VICTOZA) 18 MG/3ML SOPN 0.6 mg subcut daily x 1 wk then increase to 1.2 mg daily 09/28/19  Yes [name], MD  losartan (COZAAR) 100 MG tablet Take 1 tablet (100 mg total) by mouth daily. 09/28/19  Yes [name], MD  omeprazole (PRILOSEC) 40 MG capsule TAKE 1 CAPSULE BY MOUTH EVERY MORNING. TAKE 30-60 MINUTES BEFORE BREAKFAST 07/08/19  Yes Willia Craze, NP  QUEtiapine (SEROQUEL) 50 MG tablet Take 50 mg by mouth at bedtime.   Yes [provider]  sitaGLIPtin-metformin (JANUMET) 50-1000 MG tablet Take 1 tablet by mouth 2 (two) times daily with a meal. 09/18/19  Yes [name], MD  traZODone (DESYREL) 50 MG tablet Take 1 tablet (50 mg total) by mouth at bedtime. 09/21/19  Yes Rankin, Shuvon B, NP     Positive ROS: Otherwise negative  All other systems have been reviewed and were otherwise negative with the exception of those mentioned in the HPI and as above.  Physical Exam: Constitutional: Alert, well-appearing, no acute distress Ears: External ears without lesions or tenderness. Ear canals are clear bilaterally.  As they were cleaned last visit a couple weeks ago.  TMs  are clear bilaterally. Nasal: External nose without lesions. Clear nasal passages Oral: Lips and gums without lesions. Tongue and palate mucosa without lesions. Posterior oropharynx clear. Neck: No palpable adenopathy or masses Respiratory: Breathing comfortably  Skin: No facial/neck lesions or rash noted.  Procedures  Assessment: Mild upper frequency sensorineural hearing loss in both ears right side slightly worse than left.  Plan: She has occasional ear right ear pain but has normal examination of the ear in the office today.  She has mild upper frequency SNHL but hears fairly well.  At this point recommended observation and repeat audiogram in 2 to 3 years as at that point I suspect she  may benefit from use of hearing aids.   Radene Journey, MD

## 2019-10-26 ENCOUNTER — Encounter (INDEPENDENT_AMBULATORY_CARE_PROVIDER_SITE_OTHER): Payer: Self-pay

## 2019-10-29 ENCOUNTER — Other Ambulatory Visit: Payer: Self-pay

## 2019-10-29 ENCOUNTER — Encounter: Payer: Self-pay | Admitting: Physician Assistant

## 2019-10-29 ENCOUNTER — Ambulatory Visit (INDEPENDENT_AMBULATORY_CARE_PROVIDER_SITE_OTHER): Payer: Medicare HMO | Admitting: Physician Assistant

## 2019-10-29 VITALS — BP 134/80 | HR 104 | Ht 59.0 in | Wt 140.0 lb

## 2019-10-29 DIAGNOSIS — F319 Bipolar disorder, unspecified: Secondary | ICD-10-CM

## 2019-10-29 DIAGNOSIS — R5383 Other fatigue: Secondary | ICD-10-CM | POA: Diagnosis not present

## 2019-10-29 DIAGNOSIS — F329 Major depressive disorder, single episode, unspecified: Secondary | ICD-10-CM | POA: Diagnosis not present

## 2019-10-29 DIAGNOSIS — F99 Mental disorder, not otherwise specified: Secondary | ICD-10-CM | POA: Diagnosis not present

## 2019-10-29 DIAGNOSIS — F32A Depression, unspecified: Secondary | ICD-10-CM

## 2019-10-29 DIAGNOSIS — F411 Generalized anxiety disorder: Secondary | ICD-10-CM

## 2019-10-29 DIAGNOSIS — F5105 Insomnia due to other mental disorder: Secondary | ICD-10-CM

## 2019-10-29 DIAGNOSIS — F528 Other sexual dysfunction not due to a substance or known physiological condition: Secondary | ICD-10-CM | POA: Diagnosis not present

## 2019-10-29 MED ORDER — QUETIAPINE FUMARATE 50 MG PO TABS: 50.0000 mg | ORAL_TABLET | Freq: Every day | ORAL | 1 refills | Status: DC

## 2019-10-29 MED ORDER — HYDROXYZINE PAMOATE 25 MG PO CAPS: 25.0000 mg | ORAL_CAPSULE | Freq: Three times a day (TID) | ORAL | 1 refills | Status: DC

## 2019-10-29 MED ORDER — ARIPIPRAZOLE 10 MG PO TABS: 10.0000 mg | ORAL_TABLET | Freq: Every day | ORAL | 1 refills | Status: DC

## 2019-10-29 MED ORDER — CITALOPRAM HYDROBROMIDE 10 MG PO TABS: 10.0000 mg | ORAL_TABLET | Freq: Every day | ORAL | 1 refills | Status: DC

## 2019-10-29 MED ORDER — MODAFINIL 200 MG PO TABS: 100.0000 mg | ORAL_TABLET | Freq: Every day | ORAL | 1 refills | Status: DC
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
## 2019-11-01 ENCOUNTER — Telehealth: Payer: Self-pay | Admitting: Physician Assistant

## 2019-11-01 NOTE — Telephone Encounter (Signed)
Pt called stating she has med question. Please return call @ [phone]. Next appt 5/7

## 2019-11-01 NOTE — Telephone Encounter (Signed)
Recommend she continue to try it for at least a week.  I doubt this drug is causing her side effects.  Cannot give up after 2 or 3 days of taking a pill.

## 2019-11-02 NOTE — Telephone Encounter (Signed)
Patient aware to give medication at least a week then call back if symptoms not improving or worsening.

## 2019-11-05 ENCOUNTER — Telehealth: Payer: Self-pay

## 2019-11-05 NOTE — Telephone Encounter (Signed)
JANUMET 50-1000MG  PRIOR AUTH HAS BEEN APPROVED THRU 07/21/2020

## 2019-11-09 DIAGNOSIS — M545 Low back pain: Secondary | ICD-10-CM | POA: Diagnosis not present

## 2019-11-09 DIAGNOSIS — G894 Chronic pain syndrome: Secondary | ICD-10-CM | POA: Diagnosis not present

## 2019-11-09 DIAGNOSIS — M25519 Pain in unspecified shoulder: Secondary | ICD-10-CM | POA: Diagnosis not present

## 2019-11-09 DIAGNOSIS — G4700 Insomnia, unspecified: Secondary | ICD-10-CM | POA: Diagnosis not present

## 2019-11-09 DIAGNOSIS — Z79891 Long term (current) use of opiate analgesic: Secondary | ICD-10-CM | POA: Diagnosis not present

## 2019-11-09 DIAGNOSIS — M5416 Radiculopathy, lumbar region: Secondary | ICD-10-CM | POA: Diagnosis not present

## 2019-11-26 ENCOUNTER — Ambulatory Visit: Payer: Medicare HMO | Admitting: Internal Medicine

## 2019-11-26 ENCOUNTER — Other Ambulatory Visit: Payer: Self-pay

## 2019-11-26 ENCOUNTER — Encounter: Payer: Self-pay | Admitting: Physician Assistant

## 2019-11-26 ENCOUNTER — Ambulatory Visit (INDEPENDENT_AMBULATORY_CARE_PROVIDER_SITE_OTHER): Payer: Medicare HMO | Admitting: Physician Assistant

## 2019-11-26 ENCOUNTER — Ambulatory Visit: Payer: Medicare HMO | Admitting: Physician Assistant

## 2019-11-26 DIAGNOSIS — F319 Bipolar disorder, unspecified: Secondary | ICD-10-CM

## 2019-11-26 DIAGNOSIS — F411 Generalized anxiety disorder: Secondary | ICD-10-CM | POA: Diagnosis not present

## 2019-11-26 DIAGNOSIS — R5383 Other fatigue: Secondary | ICD-10-CM

## 2019-11-26 DIAGNOSIS — F329 Major depressive disorder, single episode, unspecified: Secondary | ICD-10-CM | POA: Diagnosis not present

## 2019-11-26 DIAGNOSIS — F32A Depression, unspecified: Secondary | ICD-10-CM

## 2019-11-26 MED ORDER — BUPROPION HCL ER (XL) 150 MG PO TB24: 150.0000 mg | ORAL_TABLET | Freq: Every day | ORAL | 1 refills | Status: DC

## 2019-11-26 NOTE — Progress Notes (Signed)
[facility] Med Check  Patient ID: Mercede Rollo,  MRN: [id number]  PCP: [name], MD  Date of Evaluation: 11/26/2019 Time spent:30 minutes  Chief Complaint:  Chief Complaint    Follow-up      HISTORY/CURRENT STATUS: HPI For routine med check.   At [facility], we started Modafinil and Celexa.  She d/c the Celexa after about 7 days, b/c she felt 'weird.' Still taking the Modafinil, which she states makes her hungry and sluggish. She shows me 2 bottles of pills that she stopped, one was Abilify that I wasn't aware she was on, but she stopped it about 2 weeks ago too. (Very difficult to get her hx w/ meds.)  She seems to remember a medication that she was on that did help, but she does not know the name.  Says she feels some better.  Her mood is more ' even.'  States she has not been as hypersexual.  Denies increased energy with decreased need for sleep.  She sleeps well, using the hydroxyzine and Seroquel some nights to go to sleep but not every night.  No impulsivity or risky behavior, no grandiosity, no increased spending, no hallucinations or paranoia.  Continues to have feelings of sluggishness, like she wants more energy.  Her mom is here from [place] and she wants to be able to enjoy her time together.  She does not cry easily.  She does not want to stay in bed all the time.  Feels that the modafinil must be helping some but not sure from what she previously said.  No suicidal or homicidal thoughts.  Does not feel anxiety very often but when she does, she uses the hydroxyzine and it does help.  Still has trouble concentrating at times and states she walks slowly into her room, will look around and not remember what she went in there for and then will slowly turn around and walk back out.  Denies dizziness, syncope, seizures, numbness, tingling, tremor, tics, unsteady gait, slurred speech, confusion. Denies muscle or joint pain, stiffness, or dystonia.  Individual Medical  History/ Review of Systems: Changes? :No    Past medications for mental health diagnoses include: Abilify, Zoloft, Adderall for energy and motivation, Trazodone wasn't effective, Elavil for sleep helps, Seroquel for sleep which helps, maybe Prozac, uncertain of others.  Allergies: Nitroglycerin and Tricor [fenofibrate]  Current Medications:  Current Outpatient Medications:  .  ACCU-CHEK FASTCLIX LANCETS MISC, Use as directed to test blood sugar once daily, Disp: 100 each, Rfl: 12 .  amitriptyline (ELAVIL) 25 MG tablet, Take 25 mg by mouth at bedtime., Disp: , Rfl:  .  Blood Glucose Monitoring Suppl (ACCU-CHEK GUIDE) w/Device KIT, 1 each by Does not apply route daily. Use as directed to test blood sugar once daily, Disp: 1 kit, Rfl: 0 .  gemfibrozil (LOPID) 600 MG tablet, Take 1 tablet (600 mg total) by mouth 2 (two) times daily before a meal., Disp: 60 tablet, Rfl: 4 .  glucose blood (ACCU-CHEK GUIDE) test strip, Use as directed to test blood sugar once daily, Disp: 100 each, Rfl: 12 .  hydrOXYzine (VISTARIL) 25 MG capsule, Take 1 capsule (25 mg total) by mouth 3 (three) times daily., Disp: 90 capsule, Rfl: 1 .  liraglutide (VICTOZA) 18 MG/3ML SOPN, 0.6 mg subcut daily x 1 wk then increase to 1.2 mg daily, Disp: 6 mL, Rfl: 3 .  losartan (COZAAR) 100 MG tablet, Take 1 tablet (100 mg total) by mouth daily., Disp: 30 tablet, Rfl: 6 .  modafinil (PROVIGIL) 200 MG tablet, Take 0.5 tablets (100 mg total) by mouth daily., Disp: 30 tablet, Rfl: 1 .  omeprazole (PRILOSEC) 40 MG capsule, TAKE 1 CAPSULE BY MOUTH EVERY MORNING. TAKE 30-60 MINUTES BEFORE BREAKFAST, Disp: 30 capsule, Rfl: 5 .  QUEtiapine (SEROQUEL) 50 MG tablet, Take 1 tablet (50 mg total) by mouth at bedtime., Disp: 30 tablet, Rfl: 1 .  sitaGLIPtin-metformin (JANUMET) 50-1000 MG tablet, Take 1 tablet by mouth 2 (two) times daily with a meal., Disp: 60 tablet, Rfl: 3 .  ARIPiprazole (ABILIFY) 10 MG tablet, Take 1 tablet (10 mg total) by mouth  daily. (Patient not taking: Reported on 11/26/2019), Disp: 30 tablet, Rfl: 1 .  buPROPion (WELLBUTRIN XL) 150 MG 24 hr tablet, Take 1 tablet (150 mg total) by mouth daily., Disp: 30 tablet, Rfl: 1 .  citalopram (CELEXA) 10 MG tablet, Take 1 tablet (10 mg total) by mouth daily. (Patient not taking: Reported on 11/26/2019), Disp: 30 tablet, Rfl: 1 .  Insulin Pen Needle (TRUEPLUS PEN NEEDLES) 31G X 6 MM MISC, Use to inject Victoza daily., Disp: 100 each, Rfl: 1 Medication Side Effects: none  Family Medical/ Social History: Changes? No  MENTAL HEALTH EXAM:  Last menstrual period 05/30/2015.There is no height or weight on file to calculate BMI.  General Appearance: Casual, Neat, Well Groomed and Obese  Eye Contact:  Good  Speech:  Clear and Coherent and Normal Rate  Volume:  Normal  Mood:  Euthymic  Affect:  Appropriate  Thought Process:  Goal Directed and Descriptions of Associations: Intact  Orientation:  Full (Time, Place, and Person)  Thought Content: Logical   Suicidal Thoughts:  No  Homicidal Thoughts:  No  Memory:  WNL  Judgement:  Good  Insight:  Good  Psychomotor Activity:  Normal  Concentration:  Concentration: Good and Attention Span: Good  Recall:  Good  Fund of Knowledge: Good  Language: Good  Assets:  Desire for Improvement  ADL's:  Intact  Cognition: WNL  Prognosis:  Good    DIAGNOSES:    ICD-10-CM   1. Bipolar I disorder with depression ([place])  F31.9   2. Generalized anxiety disorder  F41.1   3. Fatigue due to depression  F32.9    R53.83     Receiving Psychotherapy: No    RECOMMENDATIONS:  PDMP was reviewed. I spent 20 minutes with her. I asked her to get a printout of her current medications from her pharmacist and bring it in at the next visit. I still believe she would benefit from an antidepressant and recommended Wellbutrin.  There are no contraindications.  We discussed this drug, benefits, risks, side effects and she accepts. As far as the weight gain  goes it is most likely caused by the Abilify, not the modafinil or even the Celexa as she suspects.  At any rate, she is already stopped both the Abilify and Celexa. Start Wellbutrin XL 150 mg, 1 p.o. every morning. Continue hydroxyzine 25 mg, 1 p.o. 3 times daily as needed. Continue modafinil 200 mg, one half p.o. every morning. Continue Seroquel 50 mg, 1 p.o. nightly. Continue amitriptyline 25 mg, 1 p.o. nightly. Recommend counseling. Return in 4 weeks.  Donnal Moat, PA-C

## 2019-12-06 ENCOUNTER — Other Ambulatory Visit: Payer: Self-pay | Admitting: Physician Assistant

## 2019-12-19 ENCOUNTER — Other Ambulatory Visit: Payer: Self-pay | Admitting: Physician Assistant

## 2019-12-21 NOTE — Telephone Encounter (Signed)
Patient started this at last visit 05/07, 90 day okay? Has apt 06/04

## 2019-12-24 ENCOUNTER — Other Ambulatory Visit: Payer: Self-pay | Admitting: Internal Medicine

## 2019-12-24 ENCOUNTER — Encounter: Payer: Self-pay | Admitting: Physician Assistant

## 2019-12-24 ENCOUNTER — Ambulatory Visit (INDEPENDENT_AMBULATORY_CARE_PROVIDER_SITE_OTHER): Payer: Medicare HMO | Admitting: Physician Assistant

## 2019-12-24 ENCOUNTER — Other Ambulatory Visit: Payer: Self-pay

## 2019-12-24 DIAGNOSIS — F411 Generalized anxiety disorder: Secondary | ICD-10-CM | POA: Diagnosis not present

## 2019-12-24 DIAGNOSIS — F319 Bipolar disorder, unspecified: Secondary | ICD-10-CM

## 2019-12-24 DIAGNOSIS — E119 Type 2 diabetes mellitus without complications: Secondary | ICD-10-CM

## 2019-12-24 DIAGNOSIS — F5105 Insomnia due to other mental disorder: Secondary | ICD-10-CM

## 2019-12-24 DIAGNOSIS — F99 Mental disorder, not otherwise specified: Secondary | ICD-10-CM

## 2019-12-24 MED ORDER — MODAFINIL 200 MG PO TABS: 100.0000 mg | ORAL_TABLET | Freq: Every day | ORAL | 1 refills | Status: DC

## 2019-12-24 MED ORDER — HYDROXYZINE PAMOATE 25 MG PO CAPS: 25.0000 mg | ORAL_CAPSULE | Freq: Three times a day (TID) | ORAL | 1 refills | Status: DC

## 2019-12-24 MED ORDER — BUPROPION HCL ER (XL) 300 MG PO TB24: 300.0000 mg | ORAL_TABLET | Freq: Every day | ORAL | 1 refills | Status: DC

## 2019-12-24 NOTE — Progress Notes (Signed)
[facility] Med Check  Patient ID: Amy Glass,  MRN: [id number]  PCP: [name], MD  Date of Evaluation: 12/24/2019 Time spent:20 minutes  Chief Complaint:  Chief Complaint    Follow-up      HISTORY/CURRENT STATUS: HPI For routine med check.   States the Wellbutrin has really helped a lot with energy and motivation.  She thinks that it could help even more however.  She does feel more like enjoying things and she is not sad all the time.  Not crying easily.  Sleeps well by taking the Seroquel.  She does not know if she is taking amitriptyline or not.  Denies suicidal or homicidal thoughts.  Patient denies increased energy with decreased need for sleep, no increased talkativeness, no racing thoughts, no impulsivity or risky behaviors, no increased spending, no increased libido, no grandiosity, no increased irritability or anger, and no hallucinations.  Denies dizziness, syncope, seizures, numbness, tingling, tremor, tics, unsteady gait, slurred speech, confusion. Denies muscle or joint pain, stiffness, or dystonia.  Individual Medical History/ Review of Systems: Changes? :No    Past medications for mental health diagnoses include: Abilify, Zoloft, Adderall for energy and motivation, Trazodone wasn't effective, Elavil for sleep helps, Seroquel for sleep which helps, maybe Prozac, Effexor,uncertain of others.  Allergies: Nitroglycerin and Tricor [fenofibrate]  Current Medications:  Current Outpatient Medications:  .  ACCU-CHEK FASTCLIX LANCETS MISC, Use as directed to test blood sugar once daily, Disp: 100 each, Rfl: 12 .  Blood Glucose Monitoring Suppl (ACCU-CHEK GUIDE) w/Device KIT, 1 each by Does not apply route daily. Use as directed to test blood sugar once daily, Disp: 1 kit, Rfl: 0 .  gemfibrozil (LOPID) 600 MG tablet, TAKE 1 TABLET (600 MG TOTAL) BY MOUTH 2 (TWO) TIMES DAILY BEFORE A MEAL., Disp: 180 tablet, Rfl: 1 .  glucose blood (ACCU-CHEK GUIDE) test  strip, Use as directed to test blood sugar once daily, Disp: 100 each, Rfl: 12 .  hydrOXYzine (VISTARIL) 25 MG capsule, Take 1 capsule (25 mg total) by mouth 3 (three) times daily., Disp: 90 capsule, Rfl: 1 .  Insulin Pen Needle (TRUEPLUS PEN NEEDLES) 31G X 6 MM MISC, Use to inject Victoza daily., Disp: 100 each, Rfl: 1 .  losartan (COZAAR) 100 MG tablet, Take 1 tablet (100 mg total) by mouth daily., Disp: 30 tablet, Rfl: 6 .  modafinil (PROVIGIL) 200 MG tablet, Take 0.5 tablets (100 mg total) by mouth daily., Disp: 30 tablet, Rfl: 1 .  QUEtiapine (SEROQUEL) 50 MG tablet, 1 BY MOUTH EVERY NIGHT AS NEEDED INSOMNIA, Disp: 30 tablet, Rfl: 3 .  sitaGLIPtin-metformin (JANUMET) 50-1000 MG tablet, Take 1 tablet by mouth 2 (two) times daily with a meal., Disp: 60 tablet, Rfl: 3 .  amitriptyline (ELAVIL) 25 MG tablet, Take 25 mg by mouth at bedtime., Disp: , Rfl:  .  buPROPion (WELLBUTRIN XL) 300 MG 24 hr tablet, Take 1 tablet (300 mg total) by mouth daily., Disp: 30 tablet, Rfl: 1 .  liraglutide (VICTOZA) 18 MG/3ML SOPN, 0.6 mg subcut daily x 1 wk then increase to 1.2 mg daily (Patient not taking: Reported on 12/24/2019), Disp: 6 mL, Rfl: 3 .  omeprazole (PRILOSEC) 40 MG capsule, TAKE 1 CAPSULE BY MOUTH EVERY MORNING. TAKE 30-60 MINUTES BEFORE BREAKFAST (Patient not taking: No sig reported), Disp: 30 capsule, Rfl: 5 Medication Side Effects: none  Family Medical/ Social History: Changes? No  MENTAL HEALTH EXAM:  Last menstrual period 05/30/2015.There is no height or weight on file to calculate  BMI.  General Appearance: Casual, Neat, Well Groomed and Obese  Eye Contact:  Good  Speech:  Clear and Coherent and Normal Rate  Volume:  Normal  Mood:  Euthymic  Affect:  Appropriate  Thought Process:  Goal Directed and Descriptions of Associations: Intact  Orientation:  Full (Time, Place, and Person)  Thought Content: Logical   Suicidal Thoughts:  No  Homicidal Thoughts:  No  Memory:  WNL  Judgement:  Good   Insight:  Good  Psychomotor Activity:  Normal  Concentration:  Concentration: Good and Attention Span: Good  Recall:  Good  Fund of Knowledge: Good  Language: Good  Assets:  Desire for Improvement  ADL's:  Intact  Cognition: WNL  Prognosis:  Good    DIAGNOSES:    ICD-10-CM   1. Bipolar I disorder with depression ([place])  F31.9   2. Insomnia due to other mental disorder  F51.05    F99   3. Generalized anxiety disorder  F41.1     Receiving Psychotherapy: No    RECOMMENDATIONS:  PDMP was reviewed. I spent 20 minutes with her. Patient did bring in a printout of her medications from her pharmacist.  However there are medications on there that she does not take now and she does not know which ones they are.  We go through something similar to this every time.  I reminded her that I cannot treat her appropriately if I do not know what she is taking now.  The next time she comes, she needs to bring all of her medications, vitamins, supplements, in their original bottles.  She verbalizes understanding.  Continue hydroxyzine 25 mg, 1 p.o. 3 times daily as needed. Increase Wellbutrin XL to 300 mg, 1 p.o. nightly a.m. Continue modafinil 200 mg, one half p.o. every morning. Continue Seroquel 50 mg, 1 p.o. nightly. Continue amitriptyline 25 mg, 1 p.o. nightly. Recommend counseling. Return in 6 weeks.  Donnal Moat, PA-C

## 2019-12-29 ENCOUNTER — Other Ambulatory Visit: Payer: Self-pay | Admitting: Physician Assistant

## 2020-01-05 ENCOUNTER — Other Ambulatory Visit: Payer: Self-pay | Admitting: Physician Assistant

## 2020-01-17 ENCOUNTER — Telehealth: Payer: Self-pay | Admitting: Physician Assistant

## 2020-01-17 ENCOUNTER — Other Ambulatory Visit: Payer: Self-pay | Admitting: Physician Assistant

## 2020-01-17 MED ORDER — MIRTAZAPINE 7.5 MG PO TABS
7.5000 mg | ORAL_TABLET | Freq: Every evening | ORAL | 0 refills | Status: DC | PRN
Start: 2020-01-17 — End: 2020-02-04

## 2020-01-17 NOTE — Telephone Encounter (Signed)
Amy Glass called to report that she is not sleeping.  It has been several nights. What you gave her to sleep doesn't help. Doesn't know if it the increase of the bupropion is the cause or what it is.  Anyway, she needs something for sleep.  [facility] [address]

## 2020-01-17 NOTE — Telephone Encounter (Signed)
Mirtazapine prescription was sent in.

## 2020-01-18 ENCOUNTER — Telehealth: Payer: Self-pay | Admitting: Physician Assistant

## 2020-01-18 NOTE — Telephone Encounter (Signed)
Pt took meds that she was told to take, but none of them worked. Pt wants to know what else she can take to help her sleep. Pt still only getting 1hr sleep. Please call.

## 2020-01-18 NOTE — Telephone Encounter (Signed)
Please review with her no caffeine after 2 PM, no activity in bed except sleeping or sex, no screen time within 2 hours before going to sleep, or she can use the blue blocker glasses if she has to be on line.  Also make sure she is getting enough exercise and eating healthy foods that do not cause reflux etc.  Otherwise she is going to need to give the mirtazapine bit longer to work.

## 2020-01-20 NOTE — Telephone Encounter (Signed)
Patient thinks she's got her sleeping issues under control with her OTC medications so for right now she will leave it at 300 mg Wellbutrin XL. Advised her it is okay to decrease her dose if she changes her mind. She agreed. Very appreciative of the follow up calls.

## 2020-01-20 NOTE — Telephone Encounter (Signed)
Yes, go back to 150mg .  If I need to send a RX let me know.

## 2020-01-29 ENCOUNTER — Other Ambulatory Visit: Payer: Self-pay | Admitting: Internal Medicine

## 2020-01-29 NOTE — Telephone Encounter (Signed)
Requested medication (s) are due for refill today: yes  Requested medication (s) are on the active medication list: yes  Last refill:  09/18/19  Future visit scheduled: no  Notes to clinic:  overdue for blood work   Requested Prescriptions  Pending Prescriptions Disp Refills   JANUMET 50-1000 MG tablet [Pharmacy Med Name: JANUMET 50-1,000 MG TABLET] 60 tablet 3    Sig: TAKE 1 TABLET TWICE A DAY WITH FOOD      Endocrinology:  Diabetes - Biguanide + DPP-4 Inhibitor Combos Failed - 01/29/2020  9:09 AM      Failed - HBA1C is between 0 and 7.9 and within 180 days    HbA1c, POC (controlled diabetic range)  Date Value Ref Range Status  02/27/2018 7.6 (A) 0.0 - 7.0 % Final   Hgb A1c MFr Bld  Date Value Ref Range Status  09/21/2019 9.8 (H) 4.8 - 5.6 % Final    Comment:    (NOTE) Pre diabetes:          5.7%-6.4% Diabetes:              >6.4% Glycemic control for   <7.0% adults with diabetes           Passed - Cr in normal range and within 360 days    Creat  Date Value Ref Range Status  12/21/2015 0.56 0.50 - 1.05 mg/dL Final   Creatinine, Ser  Date Value Ref Range Status  02/12/2019 0.95 0.44 - 1.00 mg/dL Final          Passed - eGFR in normal range and within 360 days    GFR, Est African American  Date Value Ref Range Status  12/21/2015 >89 >=60 mL/min Final   GFR calc Af Amer  Date Value Ref Range Status  02/12/2019 >60 >60 mL/min Final   GFR, Est Non African American  Date Value Ref Range Status  12/21/2015 >89 >=60 mL/min Final    Comment:      The estimated GFR is a calculation valid for adults (>=76 years old) that uses the CKD-EPI algorithm to adjust for age and sex. It is   not to be used for children, pregnant women, hospitalized patients,    patients on dialysis, or with rapidly changing kidney function. According to the NKDEP, eGFR >89 is normal, 60-89 shows mild impairment, 30-59 shows moderate impairment, 15-29 shows severe impairment and <15 is  ESRD.      GFR calc non Af Amer  Date Value Ref Range Status  02/12/2019 >60 >60 mL/min Final          Passed - Valid encounter within last 6 months    Recent Outpatient Visits           4 months ago Encounter for medication counseling   [place], RPH-CPP   4 months ago Type 2 diabetes mellitus without complication, without long-term current use of insulin ([place])   [place] [name], MD   1 year ago Localized swelling, mass, and lump of head   [place] [name], MD   1 year ago Controlled type 2 diabetes mellitus with hyperglycemia, without long-term current use of insulin ([place])   [place], MD   1 year ago Diabetes mellitus type 2, uncontrolled, without complications [facility])   [place] [facility] [name], MD

## 2020-02-04 ENCOUNTER — Other Ambulatory Visit: Payer: Self-pay

## 2020-02-04 ENCOUNTER — Ambulatory Visit (INDEPENDENT_AMBULATORY_CARE_PROVIDER_SITE_OTHER): Payer: Medicare HMO | Admitting: Physician Assistant

## 2020-02-04 ENCOUNTER — Encounter: Payer: Self-pay | Admitting: Physician Assistant

## 2020-02-04 DIAGNOSIS — R5383 Other fatigue: Secondary | ICD-10-CM | POA: Diagnosis not present

## 2020-02-04 DIAGNOSIS — F99 Mental disorder, not otherwise specified: Secondary | ICD-10-CM | POA: Diagnosis not present

## 2020-02-04 DIAGNOSIS — F329 Major depressive disorder, single episode, unspecified: Secondary | ICD-10-CM

## 2020-02-04 DIAGNOSIS — F319 Bipolar disorder, unspecified: Secondary | ICD-10-CM

## 2020-02-04 DIAGNOSIS — F5105 Insomnia due to other mental disorder: Secondary | ICD-10-CM

## 2020-02-04 DIAGNOSIS — F411 Generalized anxiety disorder: Secondary | ICD-10-CM

## 2020-02-04 DIAGNOSIS — F32A Depression, unspecified: Secondary | ICD-10-CM

## 2020-02-04 MED ORDER — VENLAFAXINE HCL ER 75 MG PO CP24
75.0000 mg | ORAL_CAPSULE | Freq: Every day | ORAL | 1 refills | Status: DC
Start: 2020-02-04 — End: 2020-02-28

## 2020-02-04 MED ORDER — TRAZODONE HCL 100 MG PO TABS: 100.0000 mg | ORAL_TABLET | Freq: Every evening | ORAL | 5 refills | Status: DC | PRN

## 2020-02-04 NOTE — Progress Notes (Signed)
[facility] Med Check  Patient ID: Amy Glass,  MRN: [id number]  PCP: [name], MD  Date of Evaluation: 02/04/2020 Time spent:30 minutes  Chief Complaint:  Chief Complaint    Anxiety; Insomnia; Depression      HISTORY/CURRENT STATUS: HPI For routine med check.   Maleta stopped the Wellbutrin.  "It was not helping."  And she thought it might have been keeping her awake.  Not sure about that though.  The Seroquel was not working for the sleep and neither did mirtazapine.    She brought in an old prescription of Effexor, stating that worked better than anything else she has been on and would like to restart that.  States she feels "down" and energy level is not very good at times.  There are other times where she feels more motivated but not overly so.  She does not cry easily.  She is not isolating.  She does feel lonely at times and misses her daughter, they have a strained relationship.  Is disabled, not working.  No suicidal or homicidal thoughts.  Continues to have anxiety off and on.  It is usually triggered by something, usually related to family issues.  The hydroxyzine does help some.  Patient denies increased energy with decreased need for sleep, no increased talkativeness, no racing thoughts, no impulsivity or risky behaviors, no increased spending, no increased libido, no grandiosity, no increased irritability or anger, and no hallucinations.  Denies dizziness, syncope, seizures, numbness, tingling, tremor, tics, unsteady gait, slurred speech, confusion. Denies muscle or joint pain, stiffness, or dystonia.  Individual Medical History/ Review of Systems: Changes? :No    Past medications for mental health diagnoses include: Abilify, Zoloft, Adderall for energy and motivation, Trazodone wasn't effective, Elavil for sleep helps, Seroquel for sleep which helps, maybe Prozac, Effexor, Wellbutrin, Effexor, modafinil, mirtazapine was not effective.  Uncertain of  others.  Allergies: Nitroglycerin and Tricor [fenofibrate]  Current Medications:  Current Outpatient Medications:  .  ACCU-CHEK FASTCLIX LANCETS MISC, Use as directed to test blood sugar once daily, Disp: 100 each, Rfl: 12 .  Blood Glucose Monitoring Suppl (ACCU-CHEK GUIDE) w/Device KIT, 1 each by Does not apply route daily. Use as directed to test blood sugar once daily, Disp: 1 kit, Rfl: 0 .  gemfibrozil (LOPID) 600 MG tablet, TAKE 1 TABLET (600 MG TOTAL) BY MOUTH 2 (TWO) TIMES DAILY BEFORE A MEAL., Disp: 180 tablet, Rfl: 1 .  glucose blood (ACCU-CHEK GUIDE) test strip, Use as directed to test blood sugar once daily, Disp: 100 each, Rfl: 12 .  hydrOXYzine (VISTARIL) 25 MG capsule, Take 1 capsule (25 mg total) by mouth 3 (three) times daily., Disp: 90 capsule, Rfl: 1 .  Insulin Pen Needle (TRUEPLUS PEN NEEDLES) 31G X 6 MM MISC, Use to inject Victoza daily., Disp: 100 each, Rfl: 1 .  losartan (COZAAR) 100 MG tablet, Take 1 tablet (100 mg total) by mouth daily., Disp: 30 tablet, Rfl: 6 .  sitaGLIPtin-metformin (JANUMET) 50-1000 MG tablet, Take 1 tablet by mouth 2 (two) times daily with a meal. Must have office visit for refills, Disp: 60 tablet, Rfl: 0 .  traZODone (DESYREL) 100 MG tablet, Take 1 tablet (100 mg total) by mouth at bedtime as needed for sleep., Disp: 30 tablet, Rfl: 5 .  liraglutide (VICTOZA) 18 MG/3ML SOPN, 0.6 mg subcut daily x 1 wk then increase to 1.2 mg daily (Patient not taking: Reported on 12/24/2019), Disp: 6 mL, Rfl: 3 .  omeprazole (PRILOSEC) 40 MG capsule,  TAKE 1 CAPSULE BY MOUTH EVERY MORNING. TAKE 30-60 MINUTES BEFORE BREAKFAST (Patient not taking: No sig reported), Disp: 30 capsule, Rfl: 5 .  venlafaxine XR (EFFEXOR XR) 75 MG 24 hr capsule, Take 1 capsule (75 mg total) by mouth daily with breakfast., Disp: 30 capsule, Rfl: 1 Medication Side Effects: none  Family Medical/ Social History: Changes? No  MENTAL HEALTH EXAM:  Last menstrual period 05/30/2015.There is no  height or weight on file to calculate BMI.  General Appearance: Casual, Neat, Well Groomed and Obese  Eye Contact:  Good  Speech:  Clear and Coherent and Normal Rate  Volume:  Normal  Mood:  Euthymic  Affect:  Appropriate  Thought Process:  Goal Directed and Descriptions of Associations: Intact  Orientation:  Full (Time, Place, and Person)  Thought Content: Logical   Suicidal Thoughts:  No  Homicidal Thoughts:  No  Memory:  WNL  Judgement:  Good  Insight:  Good  Psychomotor Activity:  Normal  Concentration:  Concentration: Good and Attention Span: Good  Recall:  Good  Fund of Knowledge: Good  Language: Good  Assets:  Desire for Improvement  ADL's:  Intact  Cognition: WNL  Prognosis:  Good    DIAGNOSES:    ICD-10-CM   1. Bipolar I disorder with depression ([place])  F31.9   2. Insomnia due to other mental disorder  F51.05    F99   3. Generalized anxiety disorder  F41.1   4. Fatigue due to depression  F32.9    R53.83     Receiving Psychotherapy: No    RECOMMENDATIONS:  PDMP was reviewed. I provided 30 minutes of face-to-face time during this encounter. We again discussed compliance.  I am glad that she called to discuss the sleep issues.  I would like for her to do that if there is ever a time in the future that her medication is not working or she is having side effects from it.  It is never a good idea to stop medications on her own. She really needs to be on a mood stabilizer or atypical antipsychotic because of the bipolar disorder.  Patient does not feel that she needs that type medicine and refuses to take it at this time. We discussed the sleep problems.  We went over sleep hygiene techniques.  We agreed to retry trazodone. Start Effexor XR 75 mg, 1 p.o. every morning. Continue hydroxyzine 25 mg, 1 p.o. 3 times daily as needed. Discontinue modafinil which she has already done. Discontinue Seroquel which she has already done. Discontinue amitriptyline, patient already  done. Start trazodone 100 mg, 1 p.o. nightly as needed sleep. Recommend counseling. Return in 4 weeks.  Donnal Moat, PA-C

## 2020-02-21 ENCOUNTER — Telehealth: Payer: Self-pay | Admitting: Internal Medicine

## 2020-02-21 NOTE — Telephone Encounter (Signed)
Please advise.  Copied from [place] [phone]. Topic: General - Call Back - No Documentation >> Feb 18, 2020  2:18 PM Erick Blinks wrote: Best contact: [phone]  Pt called and would like to speak to a social worker, did not disclose why.

## 2020-02-24 NOTE — Telephone Encounter (Signed)
Called patient and LVM to return call regarding scheduling a sooner appt.   Copied from [place] [phone]. Topic: Appointment Scheduling - Scheduling Inquiry for Clinic >> Feb 18, 2020  2:15 PM Erick Blinks wrote: Reason for CRM: Pt is not satisfied with appt, patient wants to be seen sooner. Please advise if possible Best contact: [phone]

## 2020-02-26 ENCOUNTER — Other Ambulatory Visit: Payer: Self-pay | Admitting: Physician Assistant

## 2020-03-01 ENCOUNTER — Other Ambulatory Visit: Payer: Self-pay | Admitting: Physician Assistant

## 2020-03-02 ENCOUNTER — Ambulatory Visit: Payer: Medicare HMO | Admitting: Physician Assistant

## 2020-03-08 ENCOUNTER — Ambulatory Visit: Payer: Medicare HMO | Admitting: Psychology

## 2020-03-14 NOTE — Telephone Encounter (Signed)
Pt is requesting a re-fill of Janumet at 500 mg. Pt's preferred pharmacy is [place]. Please advise.

## 2020-03-15 NOTE — Telephone Encounter (Signed)
Pt has upcoming appt. She is taking Janumet + Victoza (DPP-4 inhibitor + GLP RA). This combination is not recommended. Will forward to PCP. I recommend she be changed to single agent metformin. Will forward to her PCP for review.

## 2020-03-16 MED ORDER — JANUMET 50-1000 MG PO TABS: 1.0000 | ORAL_TABLET | Freq: Two times a day (BID) | ORAL | 1 refills | Status: DC

## 2020-03-16 NOTE — Addendum Note (Signed)
Addended by: [name] on: 03/16/2020 06:23 PM   Modules accepted: Orders

## 2020-03-20 ENCOUNTER — Ambulatory Visit: Payer: Medicare HMO | Admitting: Physical Medicine and Rehabilitation

## 2020-03-20 MED FILL — JANUMET 50-1,000 MG TABLET: 50-1000 | 30 days supply | Qty: 60 | Fill #0

## 2020-03-28 ENCOUNTER — Encounter: Payer: Medicare HMO | Admitting: Internal Medicine

## 2020-03-31 ENCOUNTER — Telehealth: Payer: Self-pay | Admitting: Physician Assistant

## 2020-03-31 NOTE — Telephone Encounter (Signed)
Pt called to schedule follow up from apt in August we canc. Provider out of office.First available apt 10/19 and put on canc list. Pt reported she needed Effexor dose increased. Was to talk about increase @ previous apt. Contact pt # [phone] to advise status on increase dose of Effexor.

## 2020-03-31 NOTE — Telephone Encounter (Signed)
Please review

## 2020-04-03 ENCOUNTER — Other Ambulatory Visit: Payer: Self-pay | Admitting: Physician Assistant

## 2020-04-03 MED ORDER — VENLAFAXINE HCL ER 150 MG PO CP24: 150.0000 mg | ORAL_CAPSULE | Freq: Every day | ORAL | 1 refills | Status: DC

## 2020-04-03 NOTE — Telephone Encounter (Signed)
Prescription was sent

## 2020-04-03 NOTE — Telephone Encounter (Signed)
Krystal please call, tell her I'll send in new Rx for Effexor XR 150 mg qd.  Need pharmacy please

## 2020-04-03 NOTE — Telephone Encounter (Signed)
Pt. Made aware. Please send to: [facility] on [address]

## 2020-04-04 ENCOUNTER — Other Ambulatory Visit: Payer: Self-pay | Admitting: Physician Assistant

## 2020-04-04 NOTE — Telephone Encounter (Signed)
Review.

## 2020-04-10 ENCOUNTER — Telehealth: Payer: Self-pay

## 2020-04-10 NOTE — Telephone Encounter (Signed)
Pt will need to wait till appt

## 2020-04-10 NOTE — Telephone Encounter (Signed)
Patient came into office wanting to know if Dr would put in lab orders to discuss at her visit on 04/27/2020 she would like a routine set of labs done.   Please call and advise

## 2020-04-17 ENCOUNTER — Other Ambulatory Visit: Payer: Self-pay

## 2020-04-17 ENCOUNTER — Ambulatory Visit: Payer: Medicare HMO | Attending: Internal Medicine | Admitting: Pharmacist

## 2020-04-17 DIAGNOSIS — Z7189 Other specified counseling: Secondary | ICD-10-CM

## 2020-04-17 NOTE — Progress Notes (Signed)
Patient was educated on the use of Janumet. Reviewed the medication and its risk for hypoglycemia. Also reviewed goal blood glucose levels. Pt reports relative hypoglycemia with current Janumet dose. Gives objectives readings ranging from 90s-110s. She also reports diarrhea which she attributes to gemfibrozil use.   - Education provided about hypoglycemia vs relative hypoglycemia. Pt verbalizes appropriate management plan.  - Pt insists she will take Janumet once daily instead of BID. - Will have her hold gemfibrozil for several days. She will call and follow-up later this week.   Total time face-to-face counseling: 15 minutes  Follow-up via telephone later this week.   Benard Halsted, PharmD, [place] [phone]

## 2020-04-21 ENCOUNTER — Encounter: Payer: Medicare HMO | Admitting: Internal Medicine

## 2020-04-27 ENCOUNTER — Other Ambulatory Visit: Payer: Self-pay

## 2020-04-27 ENCOUNTER — Ambulatory Visit: Payer: Medicare HMO | Attending: Internal Medicine | Admitting: Internal Medicine

## 2020-04-27 ENCOUNTER — Encounter: Payer: Self-pay | Admitting: Internal Medicine

## 2020-04-27 VITALS — BP 127/90 | HR 86 | Resp 16 | Wt 140.4 lb

## 2020-04-27 DIAGNOSIS — G8929 Other chronic pain: Secondary | ICD-10-CM | POA: Diagnosis not present

## 2020-04-27 DIAGNOSIS — I10 Essential (primary) hypertension: Secondary | ICD-10-CM

## 2020-04-27 DIAGNOSIS — E119 Type 2 diabetes mellitus without complications: Secondary | ICD-10-CM

## 2020-04-27 DIAGNOSIS — Z1211 Encounter for screening for malignant neoplasm of colon: Secondary | ICD-10-CM | POA: Diagnosis not present

## 2020-04-27 DIAGNOSIS — Z23 Encounter for immunization: Secondary | ICD-10-CM

## 2020-04-27 DIAGNOSIS — J302 Other seasonal allergic rhinitis: Secondary | ICD-10-CM

## 2020-04-27 DIAGNOSIS — E781 Pure hyperglyceridemia: Secondary | ICD-10-CM | POA: Diagnosis not present

## 2020-04-27 DIAGNOSIS — E663 Overweight: Secondary | ICD-10-CM | POA: Diagnosis not present

## 2020-04-27 DIAGNOSIS — M545 Low back pain, unspecified: Secondary | ICD-10-CM

## 2020-04-27 LAB — POCT GLYCOSYLATED HEMOGLOBIN (HGB A1C): HbA1c, POC (controlled diabetic range): 6.5 % (ref 0.0–7.0)

## 2020-04-27 LAB — GLUCOSE, POCT (MANUAL RESULT ENTRY): POC Glucose: 114 mg/dl — AB (ref 70–99)

## 2020-04-27 MED ORDER — FLUTICASONE PROPIONATE 50 MCG/ACT NA SUSP: 2.0000 | Freq: Every day | NASAL | 6 refills | Status: DC

## 2020-04-27 MED ORDER — LORATADINE 10 MG PO TABS: 10.0000 mg | ORAL_TABLET | Freq: Every day | ORAL | 2 refills | Status: DC

## 2020-04-27 MED ORDER — AMLODIPINE BESYLATE 5 MG PO TABS: 2.5000 mg | ORAL_TABLET | Freq: Every day | ORAL | 3 refills | Status: DC
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
## 2020-04-27 NOTE — Patient Instructions (Signed)
Your blood pressure is not at goal.  Continue the Cozaar.  We have added a low-dose of a blood pressure medication called amlodipine. I have sent prescription to your pharmacy for Claritin and Flonase nasal spray. I have referred you to the gastroenterologist and pain specialist.

## 2020-04-28 ENCOUNTER — Ambulatory Visit: Payer: Medicare HMO | Attending: Internal Medicine

## 2020-04-28 DIAGNOSIS — E119 Type 2 diabetes mellitus without complications: Secondary | ICD-10-CM

## 2020-04-29 LAB — COMPREHENSIVE METABOLIC PANEL
ALT: 21 IU/L (ref 0–32)
AST: 20 IU/L (ref 0–40)
Albumin/Globulin Ratio: 1.6 (ref 1.2–2.2)
Albumin: 4.5 g/dL (ref 3.8–4.9)
Alkaline Phosphatase: 78 IU/L (ref 44–121)
BUN/Creatinine Ratio: 17 (ref 9–23)
BUN: 12 mg/dL (ref 6–24)
Bilirubin Total: 0.3 mg/dL (ref 0.0–1.2)
CO2: 22 mmol/L (ref 20–29)
Calcium: 9.4 mg/dL (ref 8.7–10.2)
Chloride: 103 mmol/L (ref 96–106)
Creatinine, Ser: 0.71 mg/dL (ref 0.57–1.00)
GFR calc Af Amer: 109 mL/min/{1.73_m2} (ref >59)
GFR calc non Af Amer: 94 mL/min/{1.73_m2} (ref >59)
Globulin, Total: 2.8 g/dL (ref 1.5–4.5)
Glucose: 121 mg/dL — ABNORMAL HIGH (ref 65–99)
Potassium: 4.6 mmol/L (ref 3.5–5.2)
Sodium: 140 mmol/L (ref 134–144)
Total Protein: 7.3 g/dL (ref 6.0–8.5)

## 2020-04-29 LAB — LIPID PANEL
Chol/HDL Ratio: 12.1 ratio — ABNORMAL HIGH (ref 0.0–4.4)
Cholesterol, Total: 278 mg/dL — ABNORMAL HIGH (ref 100–199)
HDL: 23 mg/dL — ABNORMAL LOW (ref >39)
Triglycerides: 940 mg/dL (ref 0–149)

## 2020-04-29 MED ORDER — JANUMET 50-1000 MG PO TABS: 1.0000 | ORAL_TABLET | Freq: Every day | ORAL | 5 refills | Status: DC

## 2020-04-30 ENCOUNTER — Other Ambulatory Visit: Payer: Self-pay | Admitting: Internal Medicine

## 2020-04-30 MED ORDER — ATORVASTATIN CALCIUM 20 MG PO TABS: 20.0000 mg | ORAL_TABLET | Freq: Every day | ORAL | 6 refills | Status: DC

## 2020-05-01 ENCOUNTER — Other Ambulatory Visit: Payer: Self-pay | Admitting: Physician Assistant

## 2020-05-02 ENCOUNTER — Encounter: Payer: Self-pay | Admitting: Internal Medicine

## 2020-05-09 ENCOUNTER — Encounter: Payer: Self-pay | Admitting: Physician Assistant

## 2020-05-09 ENCOUNTER — Ambulatory Visit (INDEPENDENT_AMBULATORY_CARE_PROVIDER_SITE_OTHER): Payer: Medicare HMO | Admitting: Physician Assistant

## 2020-05-09 ENCOUNTER — Other Ambulatory Visit: Payer: Self-pay

## 2020-05-09 DIAGNOSIS — F99 Mental disorder, not otherwise specified: Secondary | ICD-10-CM

## 2020-05-09 DIAGNOSIS — F319 Bipolar disorder, unspecified: Secondary | ICD-10-CM

## 2020-05-09 DIAGNOSIS — F411 Generalized anxiety disorder: Secondary | ICD-10-CM

## 2020-05-09 DIAGNOSIS — F5105 Insomnia due to other mental disorder: Secondary | ICD-10-CM

## 2020-05-09 MED ORDER — VENLAFAXINE HCL ER 75 MG PO CP24: 75.0000 mg | ORAL_CAPSULE | Freq: Every day | ORAL | 0 refills | Status: DC

## 2020-05-09 NOTE — Progress Notes (Signed)
[facility] Med Check  Patient ID: Amy Glass,  MRN: [id number]  PCP: [name], MD  Date of Evaluation: 05/09/2020 Time spent:20 minutes  Chief Complaint:    HISTORY/CURRENT STATUS: HPI For routine med check.   Doing great! Since LOV, we increased the Effexor. But she went back down b/c states she doesn't need the higher dose now. It made her feel like she didn't have a personality. Plus, the main reason she doesn't need it now is that her daughter is back!  They had a strained relationship and now things are fine. Not feeling lonely like she was. She is now seeing a counselor which is helping.  Is able to enjoy things. Energy and motivation are good. Not isolating. Not crying easily.Sleeps well. Not anxious. Not needing the hydroxyzine at all.  No SI/HI.  Patient denies increased energy with decreased need for sleep, no increased talkativeness, no racing thoughts, no impulsivity or risky behaviors, no increased spending, no increased libido, no grandiosity, no increased irritability or anger, and no hallucinations.  Denies dizziness, syncope, seizures, numbness, tingling, tremor, tics, unsteady gait, slurred speech, confusion. Denies muscle or joint pain, stiffness, or dystonia.  Individual Medical History/ Review of Systems: Changes? :No    Past medications for mental health diagnoses include: Abilify, Zoloft, Adderall for energy and motivation, Trazodone wasn't effective, Elavil for sleep helps, Seroquel for sleep which helps, maybe Prozac, Effexor, Wellbutrin, Effexor, modafinil, mirtazapine was not effective.  Uncertain of others.  Allergies: Nitroglycerin and Tricor [fenofibrate]  Current Medications:  Current Outpatient Medications:  .  ACCU-CHEK FASTCLIX LANCETS MISC, Use as directed to test blood sugar once daily, Disp: 100 each, Rfl: 12 .  amLODipine (NORVASC) 5 MG tablet, Take 0.5 tablets (2.5 mg total) by mouth daily., Disp: 30 tablet, Rfl: 3 .   atorvastatin (LIPITOR) 20 MG tablet, Take 1 tablet (20 mg total) by mouth daily., Disp: 30 tablet, Rfl: 6 .  Blood Glucose Monitoring Suppl (ACCU-CHEK GUIDE) w/Device KIT, 1 each by Does not apply route daily. Use as directed to test blood sugar once daily, Disp: 1 kit, Rfl: 0 .  fluticasone (FLONASE) 50 MCG/ACT nasal spray, Place 2 sprays into both nostrils daily., Disp: 16 g, Rfl: 6 .  gemfibrozil (LOPID) 600 MG tablet, TAKE 1 TABLET (600 MG TOTAL) BY MOUTH 2 (TWO) TIMES DAILY BEFORE A MEAL., Disp: 180 tablet, Rfl: 1 .  glucose blood (ACCU-CHEK GUIDE) test strip, Use as directed to test blood sugar once daily, Disp: 100 each, Rfl: 12 .  Insulin Pen Needle (TRUEPLUS PEN NEEDLES) 31G X 6 MM MISC, Use to inject Victoza daily., Disp: 100 each, Rfl: 1 .  loratadine (CLARITIN) 10 MG tablet, Take 1 tablet (10 mg total) by mouth daily., Disp: 30 tablet, Rfl: 2 .  losartan (COZAAR) 100 MG tablet, Take 1 tablet (100 mg total) by mouth daily., Disp: 30 tablet, Rfl: 6 .  sitaGLIPtin-metformin (JANUMET) 50-1000 MG tablet, Take 1 tablet by mouth daily. Must have office visit for refills, Disp: 30 tablet, Rfl: 5 .  traMADol-acetaminophen (ULTRACET) 37.5-325 MG tablet, , Disp: , Rfl:  .  traZODone (DESYREL) 100 MG tablet, TAKE 1 TABLET (100 MG TOTAL) BY MOUTH AT BEDTIME AS NEEDED FOR SLEEP., Disp: 90 tablet, Rfl: 2 .  hydrOXYzine (VISTARIL) 25 MG capsule, TAKE 1 CAPSULE BY MOUTH 3 TIMES DAILY (Patient not taking: Reported on 05/09/2020), Disp: 90 capsule, Rfl: 1 .  venlafaxine XR (EFFEXOR XR) 75 MG 24 hr capsule, Take 1 capsule (75 mg  total) by mouth daily with breakfast., Disp: 90 capsule, Rfl: 0 Medication Side Effects: none  Family Medical/ Social History: Changes? No  MENTAL HEALTH EXAM:  Last menstrual period 05/30/2015.There is no height or weight on file to calculate BMI.  General Appearance: Casual, Neat, Well Groomed and Obese  Eye Contact:  Good  Speech:  Clear and Coherent and Normal Rate   Volume:  Normal  Mood:  Euthymic  Affect:  Appropriate  Thought Process:  Goal Directed and Descriptions of Associations: Intact  Orientation:  Full (Time, Place, and Person)  Thought Content: Logical   Suicidal Thoughts:  No  Homicidal Thoughts:  No  Memory:  WNL  Judgement:  Good  Insight:  Good  Psychomotor Activity:  Normal  Concentration:  Concentration: Good and Attention Span: Good  Recall:  Good  Fund of Knowledge: Good  Language: Good  Assets:  Desire for Improvement  ADL's:  Intact  Cognition: WNL  Prognosis:  Good    DIAGNOSES:    ICD-10-CM   1. Bipolar I disorder with depression ([place])  F31.9   2. Generalized anxiety disorder  F41.1   3. Insomnia due to other mental disorder  F51.05    F99     Receiving Psychotherapy: Yes    RECOMMENDATIONS:  PDMP was reviewed. I provided 20 minutes of face-to-face time during this encounter. I'm glad she is doing better! I still recommend a mood stabilizer, but she states she's fine an doesn't want to take. Continue Effexor XR 75 mg, 1 p.o. every morning. Continue hydroxyzine 25 mg, 1 p.o. 3 times daily as needed. Continue trazodone 100 mg, 1 p.o. nightly as needed sleep. Continue counseling. Return in 3 months.  Donnal Moat, PA-C

## 2020-05-26 DIAGNOSIS — M545 Low back pain, unspecified: Secondary | ICD-10-CM | POA: Diagnosis not present

## 2020-05-26 DIAGNOSIS — Z79899 Other long term (current) drug therapy: Secondary | ICD-10-CM | POA: Diagnosis not present

## 2020-05-26 DIAGNOSIS — M546 Pain in thoracic spine: Secondary | ICD-10-CM | POA: Diagnosis not present

## 2020-05-26 DIAGNOSIS — E559 Vitamin D deficiency, unspecified: Secondary | ICD-10-CM | POA: Diagnosis not present

## 2020-05-26 DIAGNOSIS — M542 Cervicalgia: Secondary | ICD-10-CM | POA: Diagnosis not present

## 2020-05-26 DIAGNOSIS — M62830 Muscle spasm of back: Secondary | ICD-10-CM | POA: Diagnosis not present

## 2020-05-26 DIAGNOSIS — M129 Arthropathy, unspecified: Secondary | ICD-10-CM | POA: Diagnosis not present

## 2020-06-02 ENCOUNTER — Encounter: Payer: Medicare HMO | Admitting: Internal Medicine

## 2020-06-05 DIAGNOSIS — F909 Attention-deficit hyperactivity disorder, unspecified type: Secondary | ICD-10-CM | POA: Diagnosis not present

## 2020-06-05 DIAGNOSIS — Z6828 Body mass index (BMI) 28.0-28.9, adult: Secondary | ICD-10-CM | POA: Diagnosis not present

## 2020-06-08 ENCOUNTER — Other Ambulatory Visit: Payer: Self-pay | Admitting: Internal Medicine

## 2020-06-08 DIAGNOSIS — I10 Essential (primary) hypertension: Secondary | ICD-10-CM

## 2020-06-08 NOTE — Telephone Encounter (Signed)
Requested medications are due for refill today yes  Requested medications are on the active medication list yes  Last refill 8/13  Notes to clinic OV note states to return in 4 weeks, no upcoming appt scheduled.

## 2020-06-10 DIAGNOSIS — F902 Attention-deficit hyperactivity disorder, combined type: Secondary | ICD-10-CM | POA: Diagnosis not present

## 2020-06-10 DIAGNOSIS — Z658 Other specified problems related to psychosocial circumstances: Secondary | ICD-10-CM | POA: Diagnosis not present

## 2020-06-12 ENCOUNTER — Ambulatory Visit: Payer: Medicare HMO | Attending: Family | Admitting: Family

## 2020-06-12 ENCOUNTER — Encounter: Payer: Self-pay | Admitting: Family

## 2020-06-12 ENCOUNTER — Other Ambulatory Visit: Payer: Self-pay

## 2020-06-12 ENCOUNTER — Ambulatory Visit: Payer: Medicare HMO | Admitting: Family

## 2020-06-12 VITALS — BP 139/93 | HR 81 | Wt 140.2 lb

## 2020-06-12 DIAGNOSIS — H00012 Hordeolum externum right lower eyelid: Secondary | ICD-10-CM

## 2020-06-12 NOTE — Progress Notes (Signed)
Right eye itching and burning, for 2 weeks pt diluted peroxide and put in eye stated stop the burning   3 days ago swelling started Pt stated out of Janumet needs refill sent to [facility] mail order pharmacy

## 2020-06-12 NOTE — Patient Instructions (Addendum)
Follow-up as needed with primary physician. Stye  A stye, also known as a hordeolum, is a bump that forms on an eyelid. It may look like a pimple next to the eyelash. A stye can form inside the eyelid (internal stye) or outside the eyelid (external stye). A stye can cause redness, swelling, and pain on the eyelid. Styes are very common. Anyone can get them at any age. They usually occur in just one eye, but you may have more than one in either eye. What are the causes? A stye is caused by an infection. The infection is almost always caused by bacteria called Staphylococcus aureus. This is a common type of bacteria that lives on the skin. An internal stye may result from an infected oil-producing gland inside the eyelid. An external stye may be caused by an infection at the base of the eyelash (hair follicle). What increases the risk? You are more likely to develop a stye if:  You have had a stye before.  You have any of these conditions: ? Diabetes. ? Red, itchy, inflamed eyelids (blepharitis). ? A skin condition such as seborrheic dermatitis or rosacea. ? High fat levels in your blood (lipids). What are the signs or symptoms? The most common symptom of a stye is eyelid pain. Internal styes are more painful than external styes. Other symptoms may include:  Painful swelling of your eyelid.  A scratchy feeling in your eye.  Tearing and redness of your eye.  Pus draining from the stye. How is this diagnosed? Your health care provider may be able to diagnose a stye just by examining your eye. The health care provider may also check to make sure:  You do not have a fever or other signs of a more serious infection.  The infection has not spread to other parts of your eye or areas around your eye. How is this treated? Most styes will clear up in a few days without treatment or with warm compresses applied to the area. You may need to use antibiotic drops or ointment to treat an  infection. In some cases, if your stye does not heal with routine treatment, your health care provider may drain pus from the stye using a thin blade or needle. This may be done if the stye is large, causing a lot of pain, or affecting your vision. Follow these instructions at home:  Take over-the-counter and prescription medicines only as told by your health care provider. This includes eye drops or ointments.  If you were prescribed an antibiotic medicine, apply or use it as told by your health care provider. Do not stop using the antibiotic even if your condition improves.  Apply a warm, wet cloth (warm compress) to your eye for 5-10 minutes, 4 times a day.  Clean the affected eyelid as directed by your health care provider.  Do not wear contact lenses or eye makeup until your stye has healed.  Do not try to pop or drain the stye.  Do not rub your eye. Contact a health care provider if:  You have chills or a fever.  Your stye does not go away after several days.  Your stye affects your vision.  Your eyeball becomes swollen, red, or painful. Get help right away if:  You have pain when moving your eye around. Summary  A stye is a bump that forms on an eyelid. It may look like a pimple next to the eyelash.  A stye can form inside the eyelid (  internal stye) or outside the eyelid (external stye). A stye can cause redness, swelling, and pain on the eyelid.  Your health care provider may be able to diagnose a stye just by examining your eye.  Apply a warm, wet cloth (warm compress) to your eye for 5-10 minutes, 4 times a day. This information is not intended to replace advice given to you by your health care provider. Make sure you discuss any questions you have with your health care provider. Document Revised: 06/20/2017 Document Reviewed: 03/20/2017 Elsevier Patient Education  2020 [name].
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
## 2020-06-14 ENCOUNTER — Other Ambulatory Visit: Payer: Self-pay | Admitting: Pharmacist

## 2020-06-14 ENCOUNTER — Other Ambulatory Visit: Payer: Self-pay

## 2020-06-14 DIAGNOSIS — I10 Essential (primary) hypertension: Secondary | ICD-10-CM

## 2020-06-14 MED ORDER — LOSARTAN POTASSIUM 100 MG PO TABS: 100.0000 mg | ORAL_TABLET | Freq: Every day | ORAL | 1 refills | Status: DC

## 2020-06-14 MED ORDER — TRAZODONE HCL 100 MG PO TABS: 100.0000 mg | ORAL_TABLET | Freq: Every evening | ORAL | 2 refills | Status: AC | PRN

## 2020-06-14 MED ORDER — VENLAFAXINE HCL ER 75 MG PO CP24: 75.0000 mg | ORAL_CAPSULE | Freq: Every day | ORAL | 0 refills | Status: DC

## 2020-06-14 MED ORDER — AMLODIPINE BESYLATE 5 MG PO TABS: 2.5000 mg | ORAL_TABLET | Freq: Every day | ORAL | 1 refills | Status: DC

## 2020-06-14 MED ORDER — ATORVASTATIN CALCIUM 20 MG PO TABS
20.0000 mg | ORAL_TABLET | Freq: Every day | ORAL | 1 refills | Status: DC
Start: 2020-06-14 — End: 2020-11-26

## 2020-06-22 DIAGNOSIS — G8929 Other chronic pain: Secondary | ICD-10-CM | POA: Diagnosis not present

## 2020-06-22 DIAGNOSIS — M546 Pain in thoracic spine: Secondary | ICD-10-CM | POA: Diagnosis not present

## 2020-06-22 DIAGNOSIS — E1140 Type 2 diabetes mellitus with diabetic neuropathy, unspecified: Secondary | ICD-10-CM | POA: Diagnosis not present

## 2020-06-22 DIAGNOSIS — M542 Cervicalgia: Secondary | ICD-10-CM | POA: Diagnosis not present

## 2020-06-22 DIAGNOSIS — Z79899 Other long term (current) drug therapy: Secondary | ICD-10-CM | POA: Diagnosis not present

## 2020-06-22 DIAGNOSIS — M545 Low back pain, unspecified: Secondary | ICD-10-CM | POA: Diagnosis not present

## 2020-07-03 ENCOUNTER — Telehealth: Payer: Self-pay

## 2020-07-03 DIAGNOSIS — K047 Periapical abscess without sinus: Secondary | ICD-10-CM | POA: Diagnosis not present

## 2020-07-03 DIAGNOSIS — K0889 Other specified disorders of teeth and supporting structures: Secondary | ICD-10-CM | POA: Diagnosis not present

## 2020-07-03 NOTE — Telephone Encounter (Signed)
FYI

## 2020-07-03 NOTE — Telephone Encounter (Signed)
[place] office staff advised patient to seek assistance at a local urgent care for facial swelling. Staff discussed with patient limited availability for

## 2020-07-03 NOTE — Telephone Encounter (Signed)
Patient asked if an antibiotic is available for facial swelling.

## 2020-07-04 NOTE — Telephone Encounter (Signed)
Contacted pt to offer an appt for this morning pt states she went to [facility] yesterday

## 2020-07-10 NOTE — Telephone Encounter (Signed)
     [place] office staff advised patient to seek assistance at a local urgent care for facial swelling. Staff discussed with patient limited availability for same day appt.

## 2020-07-12 DIAGNOSIS — Z658 Other specified problems related to psychosocial circumstances: Secondary | ICD-10-CM | POA: Diagnosis not present

## 2020-07-12 DIAGNOSIS — F902 Attention-deficit hyperactivity disorder, combined type: Secondary | ICD-10-CM | POA: Diagnosis not present

## 2020-07-18 DIAGNOSIS — M25512 Pain in left shoulder: Secondary | ICD-10-CM | POA: Diagnosis not present

## 2020-07-18 DIAGNOSIS — M25511 Pain in right shoulder: Secondary | ICD-10-CM | POA: Diagnosis not present

## 2020-07-18 DIAGNOSIS — Z79899 Other long term (current) drug therapy: Secondary | ICD-10-CM | POA: Diagnosis not present

## 2020-07-18 DIAGNOSIS — M546 Pain in thoracic spine: Secondary | ICD-10-CM | POA: Diagnosis not present

## 2020-07-18 DIAGNOSIS — M25551 Pain in right hip: Secondary | ICD-10-CM | POA: Diagnosis not present

## 2020-07-18 DIAGNOSIS — M542 Cervicalgia: Secondary | ICD-10-CM | POA: Diagnosis not present

## 2020-07-18 DIAGNOSIS — G8929 Other chronic pain: Secondary | ICD-10-CM | POA: Diagnosis not present

## 2020-07-18 DIAGNOSIS — M545 Low back pain, unspecified: Secondary | ICD-10-CM | POA: Diagnosis not present

## 2020-07-22 ENCOUNTER — Other Ambulatory Visit: Payer: Self-pay | Admitting: Internal Medicine

## 2020-07-22 DIAGNOSIS — J302 Other seasonal allergic rhinitis: Secondary | ICD-10-CM

## 2020-07-23 ENCOUNTER — Other Ambulatory Visit: Payer: Self-pay | Admitting: Physician Assistant

## 2020-07-28 ENCOUNTER — Other Ambulatory Visit (HOSPITAL_COMMUNITY)
Admission: RE | Admit: 2020-07-28 | Discharge: 2020-07-28 | Disposition: A | Discharge status: Home / Self Care | Payer: Medicare HMO | Source: Ambulatory Visit | Attending: Internal Medicine | Admitting: Internal Medicine

## 2020-07-28 ENCOUNTER — Ambulatory Visit: Payer: Medicare HMO | Attending: Internal Medicine | Admitting: Internal Medicine

## 2020-07-28 ENCOUNTER — Other Ambulatory Visit: Payer: Self-pay

## 2020-07-28 ENCOUNTER — Encounter: Payer: Self-pay | Admitting: Internal Medicine

## 2020-07-28 ENCOUNTER — Ambulatory Visit (HOSPITAL_BASED_OUTPATIENT_CLINIC_OR_DEPARTMENT_OTHER): Payer: Medicare HMO | Admitting: Pharmacist

## 2020-07-28 VITALS — BP 143/102 | HR 97 | Resp 16 | Wt 142.4 lb

## 2020-07-28 DIAGNOSIS — Z1151 Encounter for screening for human papillomavirus (HPV): Secondary | ICD-10-CM | POA: Diagnosis not present

## 2020-07-28 DIAGNOSIS — Z1231 Encounter for screening mammogram for malignant neoplasm of breast: Secondary | ICD-10-CM

## 2020-07-28 DIAGNOSIS — I10 Essential (primary) hypertension: Secondary | ICD-10-CM | POA: Diagnosis not present

## 2020-07-28 DIAGNOSIS — Z124 Encounter for screening for malignant neoplasm of cervix: Secondary | ICD-10-CM | POA: Diagnosis not present

## 2020-07-28 DIAGNOSIS — Z23 Encounter for immunization: Secondary | ICD-10-CM

## 2020-07-28 MED ORDER — AMLODIPINE BESYLATE 5 MG PO TABS: 5.0000 mg | ORAL_TABLET | Freq: Every day | ORAL | 1 refills | Status: DC
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
## 2020-07-28 NOTE — Progress Notes (Signed)
Patient presents for vaccination against zoster per orders of Dr. Johnson. Consent given. Counseling provided. No contraindications exists. Vaccine administered without incident.   Luke Van Ausdall, PharmD, BCACP, CPP Clinical Pharmacist [facility] [phone]  

## 2020-07-28 NOTE — Patient Instructions (Signed)
Zoster Vaccine, Recombinant injection What is this medicine? ZOSTER VACCINE (ZOS ter vak SEEN) is used to prevent shingles in adults 59 years old and over. This vaccine is not used to treat shingles or nerve pain from shingles. This medicine may be used for other purposes; ask your health care provider or pharmacist if you have questions. COMMON BRAND NAME(S): SHINGRIX What should I tell my health care provider before I take this medicine? They need to know if you have any of these conditions:  blood disorders or disease  cancer like leukemia or lymphoma  immune system problems or therapy  an unusual or allergic reaction to vaccines, other medications, foods, dyes, or preservatives  pregnant or trying to get pregnant  breast-feeding How should I use this medicine? This vaccine is for injection in a muscle. It is given by a health care professional. Talk to your pediatrician regarding the use of this medicine in children. This medicine is not approved for use in children. Overdosage: If you think you have taken too much of this medicine contact a poison control center or emergency room at once. NOTE: This medicine is only for you. Do not share this medicine with others. What if I miss a dose? Keep appointments for follow-up (booster) doses as directed. It is important not to miss your dose. Call your doctor or health care professional if you are unable to keep an appointment. What may interact with this medicine?  medicines that suppress your immune system  medicines to treat cancer  steroid medicines like prednisone or cortisone This list may not describe all possible interactions. Give your health care provider a list of all the medicines, herbs, non-prescription drugs, or dietary supplements you use. Also tell them if you smoke, drink alcohol, or use illegal drugs. Some items may interact with your medicine. What should I watch for while using this medicine? Visit your doctor for  regular check ups. This vaccine, like all vaccines, may not fully protect everyone. What side effects may I notice from receiving this medicine? Side effects that you should report to your doctor or health care professional as soon as possible:  allergic reactions like skin rash, itching or hives, swelling of the face, lips, or tongue  breathing problems Side effects that usually do not require medical attention (report these to your doctor or health care professional if they continue or are bothersome):  chills  headache  fever  nausea, vomiting  redness, warmth, pain, swelling or itching at site where injected  tiredness This list may not describe all possible side effects. Call your doctor for medical advice about side effects. You may report side effects to FDA at 1-800-FDA-1088. Where should I keep my medicine? This vaccine is only given in a clinic, pharmacy, doctor's office, or other health care setting and will not be stored at home. NOTE: This sheet is a summary. It may not cover all possible information. If you have questions about this medicine, talk to your doctor, pharmacist, or health care provider.  2020 Elsevier/Gold Standard (2017-02-17 13:20:30)  

## 2020-07-30 DIAGNOSIS — Z1152 Encounter for screening for COVID-19: Secondary | ICD-10-CM | POA: Diagnosis not present

## 2020-08-02 LAB — CYTOLOGY - PAP
Comment: NEGATIVE
Diagnosis: NEGATIVE
High risk HPV: NEGATIVE

## 2020-08-07 ENCOUNTER — Other Ambulatory Visit: Payer: Self-pay | Admitting: Internal Medicine

## 2020-08-07 DIAGNOSIS — J302 Other seasonal allergic rhinitis: Secondary | ICD-10-CM

## 2020-08-07 NOTE — Telephone Encounter (Signed)
Requested medication (s) are due for refill today: yes  Requested medication (s) are on the active medication listyes  Last refill:  07/24/20  #30  0 refills  Future visit scheduled: yes  Notes to clinic: warning for duplicate hydroxyzine. Med is on current list but patient reported not taking 05/09/20. Please review    Requested Prescriptions  Pending Prescriptions Disp Refills   loratadine (CLARITIN) 10 MG tablet [Pharmacy Med Name: LORATADINE 10 MG TABLET] 90 tablet 1    Sig: TAKE 1 TABLET BY MOUTH EVERY DAY      Ear, Nose, and Throat:  Antihistamines Passed - 08/07/2020  9:18 AM      Passed - Valid encounter within last 12 months    Recent Outpatient Visits           1 week ago Need for vaccination for zoster   [place], Jarome Matin, RPH-CPP   1 week ago Pap smear for cervical cancer screening   [place], [name], MD   1 month ago Hordeolum of right lower eyelid, unspecified hordeolum type   [place], [place] J, NP   3 months ago Type 2 diabetes mellitus without complication, without long-term current use of insulin [facility])   [place], MD   3 months ago Encounter for medication counseling   [place], RPH-CPP

## 2020-08-09 DIAGNOSIS — F411 Generalized anxiety disorder: Secondary | ICD-10-CM | POA: Diagnosis not present

## 2020-08-09 DIAGNOSIS — Z658 Other specified problems related to psychosocial circumstances: Secondary | ICD-10-CM | POA: Diagnosis not present

## 2020-08-09 DIAGNOSIS — F902 Attention-deficit hyperactivity disorder, combined type: Secondary | ICD-10-CM | POA: Diagnosis not present

## 2020-08-11 ENCOUNTER — Ambulatory Visit: Payer: Medicare HMO | Admitting: Physician Assistant

## 2020-08-14 ENCOUNTER — Other Ambulatory Visit: Payer: Self-pay | Admitting: Physician Assistant

## 2020-08-21 DIAGNOSIS — Z79899 Other long term (current) drug therapy: Secondary | ICD-10-CM | POA: Diagnosis not present

## 2020-08-21 DIAGNOSIS — M62830 Muscle spasm of back: Secondary | ICD-10-CM | POA: Diagnosis not present

## 2020-08-21 DIAGNOSIS — M546 Pain in thoracic spine: Secondary | ICD-10-CM | POA: Diagnosis not present

## 2020-08-21 DIAGNOSIS — M545 Low back pain, unspecified: Secondary | ICD-10-CM | POA: Diagnosis not present

## 2020-08-21 DIAGNOSIS — M542 Cervicalgia: Secondary | ICD-10-CM | POA: Diagnosis not present

## 2020-09-06 ENCOUNTER — Other Ambulatory Visit: Payer: Self-pay | Admitting: Internal Medicine

## 2020-09-06 DIAGNOSIS — E119 Type 2 diabetes mellitus without complications: Secondary | ICD-10-CM

## 2020-09-06 DIAGNOSIS — J302 Other seasonal allergic rhinitis: Secondary | ICD-10-CM

## 2020-09-06 MED ORDER — JANUMET 50-1000 MG PO TABS: 1.0000 | ORAL_TABLET | Freq: Every day | ORAL | 1 refills | Status: DC

## 2020-09-06 NOTE — Telephone Encounter (Signed)
Medication Refill - Medication: sitaGLIPtin-metformin (JANUMET) 50-1000 MG tablet loratadine (CLARITIN) 10 MG tablet     Preferred Pharmacy (with phone number or street name):  [facility] [place] Phone:  [phone]  Fax:  [phone]       Agent: Please be advised that RX refills may take up to 3 business days. We ask that you follow-up with your pharmacy.

## 2020-09-25 ENCOUNTER — Ambulatory Visit: Payer: Medicare HMO | Attending: Internal Medicine | Admitting: Pharmacist

## 2020-09-25 ENCOUNTER — Other Ambulatory Visit: Payer: Self-pay

## 2020-09-25 ENCOUNTER — Telehealth: Payer: Self-pay | Admitting: Internal Medicine

## 2020-09-25 DIAGNOSIS — Z23 Encounter for immunization: Secondary | ICD-10-CM

## 2020-09-25 NOTE — Telephone Encounter (Signed)
Patient is calling to see if she can come in earlier for - before lunch for her appt. Please advise CB- [phone]

## 2020-09-25 NOTE — Progress Notes (Signed)
Patient presents for vaccination against zoster per orders of Dr. Johnson. Consent given. Counseling provided. No contraindications exists. Vaccine administered without incident.   Luke Van Ausdall, PharmD, BCACP, CPP Clinical Pharmacist [facility] [phone]  

## 2020-09-27 ENCOUNTER — Other Ambulatory Visit: Payer: Self-pay | Admitting: Internal Medicine

## 2020-09-28 DIAGNOSIS — E559 Vitamin D deficiency, unspecified: Secondary | ICD-10-CM | POA: Diagnosis not present

## 2020-09-28 DIAGNOSIS — M546 Pain in thoracic spine: Secondary | ICD-10-CM | POA: Diagnosis not present

## 2020-09-28 DIAGNOSIS — M545 Low back pain, unspecified: Secondary | ICD-10-CM | POA: Diagnosis not present

## 2020-09-28 DIAGNOSIS — M542 Cervicalgia: Secondary | ICD-10-CM | POA: Diagnosis not present

## 2020-09-28 DIAGNOSIS — Z79899 Other long term (current) drug therapy: Secondary | ICD-10-CM | POA: Diagnosis not present

## 2020-10-01 ENCOUNTER — Other Ambulatory Visit: Payer: Self-pay | Admitting: Physician Assistant

## 2020-10-02 ENCOUNTER — Institutional Professional Consult (permissible substitution): Payer: Medicare HMO | Admitting: Licensed Clinical Social Worker

## 2020-10-02 NOTE — Telephone Encounter (Signed)
Please review

## 2020-10-03 ENCOUNTER — Other Ambulatory Visit: Payer: Self-pay | Admitting: Physician Assistant

## 2020-10-04 DIAGNOSIS — Z79899 Other long term (current) drug therapy: Secondary | ICD-10-CM | POA: Diagnosis not present

## 2020-10-04 DIAGNOSIS — F411 Generalized anxiety disorder: Secondary | ICD-10-CM | POA: Diagnosis not present

## 2020-10-04 DIAGNOSIS — F902 Attention-deficit hyperactivity disorder, combined type: Secondary | ICD-10-CM | POA: Diagnosis not present

## 2020-10-06 ENCOUNTER — Telehealth: Payer: Self-pay

## 2020-10-06 ENCOUNTER — Telehealth: Payer: Self-pay | Admitting: Internal Medicine

## 2020-10-06 DIAGNOSIS — K0889 Other specified disorders of teeth and supporting structures: Secondary | ICD-10-CM

## 2020-10-06 NOTE — Telephone Encounter (Signed)
Error

## 2020-10-06 NOTE — Telephone Encounter (Signed)
Will covering pro  Returned pt call. Pt is requesting a referral to a dentist because her right side of mouth is swollen. Pt states she is having some dental pain.

## 2020-10-06 NOTE — Telephone Encounter (Signed)
Will send to covering provider  Returned pt call. Pt is requesting a referral to a dentist because her right side of mouth is swollen. Pt states she is having some dental pain.

## 2020-10-06 NOTE — Telephone Encounter (Signed)
Patient would like the nurse to call her regarding a referral to a dentis for her swollen gums.  Patient did not want to make an appt. Because they were too far out and NT suggested she go to an urgent care since she wanted to be seen today to rule out infection.  Please advise and let patient know if the referral can be ordered and to discuss further.  CB# [phone]

## 2020-10-07 NOTE — Telephone Encounter (Signed)
Done

## 2020-10-07 NOTE — Addendum Note (Signed)
Addended by: Charlott Rakes on: 10/07/2020 03:44 PM   Modules accepted: Orders

## 2020-10-11 ENCOUNTER — Other Ambulatory Visit: Payer: Self-pay | Admitting: Physician Assistant

## 2020-10-11 NOTE — Telephone Encounter (Signed)
Pt is going to a different provider now. And doesn't need any medicine refills

## 2020-10-11 NOTE — Telephone Encounter (Signed)
Please schedule appt

## 2020-10-23 ENCOUNTER — Encounter: Payer: Self-pay | Admitting: Internal Medicine

## 2020-10-23 DIAGNOSIS — F419 Anxiety disorder, unspecified: Secondary | ICD-10-CM

## 2020-10-24 ENCOUNTER — Encounter: Payer: Self-pay | Admitting: Internal Medicine

## 2020-11-06 ENCOUNTER — Telehealth: Payer: Self-pay | Admitting: Internal Medicine

## 2020-11-06 DIAGNOSIS — E119 Type 2 diabetes mellitus without complications: Secondary | ICD-10-CM

## 2020-11-06 NOTE — Telephone Encounter (Signed)
Requested medications are due for refill today yes  Requested medications are on the active medication list yes  Last refill 2/18  Last visit 1/7 (DM not discussed)  Future visit scheduled no  Notes to clinic Has already had a curtesy refill 2/16 and there is no upcoming appointment scheduled.

## 2020-11-08 ENCOUNTER — Encounter: Payer: Self-pay | Admitting: Family Medicine

## 2020-11-08 ENCOUNTER — Other Ambulatory Visit: Payer: Self-pay

## 2020-11-08 ENCOUNTER — Ambulatory Visit: Payer: Medicare HMO | Attending: Family Medicine | Admitting: Family Medicine

## 2020-11-08 VITALS — BP 148/89 | HR 98 | Resp 20 | Ht 60.0 in | Wt 133.0 lb

## 2020-11-08 DIAGNOSIS — F902 Attention-deficit hyperactivity disorder, combined type: Secondary | ICD-10-CM | POA: Diagnosis not present

## 2020-11-08 DIAGNOSIS — Z79899 Other long term (current) drug therapy: Secondary | ICD-10-CM | POA: Diagnosis not present

## 2020-11-08 DIAGNOSIS — F411 Generalized anxiety disorder: Secondary | ICD-10-CM | POA: Diagnosis not present

## 2020-11-08 DIAGNOSIS — E1169 Type 2 diabetes mellitus with other specified complication: Secondary | ICD-10-CM | POA: Diagnosis not present

## 2020-11-08 DIAGNOSIS — I10 Essential (primary) hypertension: Secondary | ICD-10-CM | POA: Diagnosis not present

## 2020-11-08 DIAGNOSIS — F331 Major depressive disorder, recurrent, moderate: Secondary | ICD-10-CM | POA: Diagnosis not present

## 2020-11-08 LAB — GLUCOSE, POCT (MANUAL RESULT ENTRY): POC Glucose: 117 mg/dl — AB (ref 70–99)

## 2020-11-08 LAB — POCT GLYCOSYLATED HEMOGLOBIN (HGB A1C): HbA1c, POC (controlled diabetic range): 6 % (ref 0.0–7.0)

## 2020-11-08 MED ORDER — JANUMET 50-1000 MG PO TABS: ORAL_TABLET | ORAL | 6 refills | Status: DC

## 2020-11-08 NOTE — Patient Instructions (Signed)
Diabetes Mellitus and Nutrition, Adult When you have diabetes, or diabetes mellitus, it is very important to have healthy eating habits because your blood sugar (glucose) levels are greatly affected by what you eat and drink. Eating healthy foods in the right amounts, at about the same times every day, can help you:  Control your blood glucose.  Lower your risk of heart disease.  Improve your blood pressure.  Reach or maintain a healthy weight. What can affect my meal plan? Every person with diabetes is different, and each person has different needs for a meal plan. Your health care provider may recommend that you work with a dietitian to make a meal plan that is best for you. Your meal plan may vary depending on factors such as:  The calories you need.  The medicines you take.  Your weight.  Your blood glucose, blood pressure, and cholesterol levels.  Your activity level.  Other health conditions you have, such as heart or kidney disease. How do carbohydrates affect me? Carbohydrates, also called carbs, affect your blood glucose level more than any other type of food. Eating carbs naturally raises the amount of glucose in your blood. Carb counting is a method for keeping track of how many carbs you eat. Counting carbs is important to keep your blood glucose at a healthy level, especially if you use insulin or take certain oral diabetes medicines. It is important to know how many carbs you can safely have in each meal. This is different for every person. Your dietitian can help you calculate how many carbs you should have at each meal and for each snack. How does alcohol affect me? Alcohol can cause a sudden decrease in blood glucose (hypoglycemia), especially if you use insulin or take certain oral diabetes medicines. Hypoglycemia can be a life-threatening condition. Symptoms of hypoglycemia, such as sleepiness, dizziness, and confusion, are similar to symptoms of having too much  alcohol.  Do not drink alcohol if: ? Your health care provider tells you not to drink. ? You are pregnant, may be pregnant, or are planning to become pregnant.  If you drink alcohol: ? Do not drink on an empty stomach. ? Limit how much you use to:  0-1 drink a day for women.  0-2 drinks a day for men. ? Be aware of how much alcohol is in your drink. In the [place] one drink equals one 12 oz bottle of beer (355 mL), one 5 oz glass of wine (148 mL), or one 1 oz glass of hard liquor (44 mL). ? Keep yourself hydrated with water, diet soda, or unsweetened iced tea.  Keep in mind that regular soda, juice, and other mixers may contain a lot of sugar and must be counted as carbs. What are tips for following this plan? Reading food labels  Start by checking the serving size on the "Nutrition Facts" label of packaged foods and drinks. The amount of calories, carbs, fats, and other nutrients listed on the label is based on one serving of the item. Many items contain more than one serving per package.  Check the total grams (g) of carbs in one serving. You can calculate the number of servings of carbs in one serving by dividing the total carbs by 15. For example, if a food has 30 g of total carbs per serving, it would be equal to 2 servings of carbs.  Check the number of grams (g) of saturated fats and trans fats in one serving. Choose foods that have   a low amount or none of these fats.  Check the number of milligrams (mg) of salt (sodium) in one serving. Most people should limit total sodium intake to less than 2,300 mg per day.  Always check the nutrition information of foods labeled as "low-fat" or "nonfat." These foods may be higher in added sugar or refined carbs and should be avoided.  Talk to your dietitian to identify your daily goals for nutrients listed on the label. Shopping  Avoid buying canned, pre-made, or processed foods. These foods tend to be high in fat, sodium, and added  sugar.  Shop around the outside edge of the grocery store. This is where you will most often find fresh fruits and vegetables, bulk grains, fresh meats, and fresh dairy. Cooking  Use low-heat cooking methods, such as baking, instead of high-heat cooking methods like deep frying.  Cook using healthy oils, such as olive, canola, or sunflower oil.  Avoid cooking with butter, cream, or high-fat meats. Meal planning  Eat meals and snacks regularly, preferably at the same times every day. Avoid going long periods of time without eating.  Eat foods that are high in fiber, such as fresh fruits, vegetables, beans, and whole grains. Talk with your dietitian about how many servings of carbs you can eat at each meal.  Eat 4-6 oz (112-168 g) of lean protein each day, such as lean meat, chicken, fish, eggs, or tofu. One ounce (oz) of lean protein is equal to: ? 1 oz (28 g) of meat, chicken, or fish. ? 1 egg. ?  cup (62 g) of tofu.  Eat some foods each day that contain healthy fats, such as avocado, nuts, seeds, and fish.   What foods should I eat? Fruits Berries. Apples. Oranges. Peaches. Apricots. Plums. Grapes. Mango. Papaya. Pomegranate. Kiwi. Cherries. Vegetables Lettuce. Spinach. Leafy greens, including kale, chard, collard greens, and mustard greens. Beets. Cauliflower. Cabbage. Broccoli. Carrots. Green beans. Tomatoes. Peppers. Onions. Cucumbers. Brussels sprouts. Grains Whole grains, such as whole-wheat or whole-grain bread, crackers, tortillas, cereal, and pasta. Unsweetened oatmeal. Quinoa. Brown or wild rice. Meats and other proteins Seafood. Poultry without skin. Lean cuts of poultry and beef. Tofu. Nuts. Seeds. Dairy Low-fat or fat-free dairy products such as milk, yogurt, and cheese. The items listed above may not be a complete list of foods and beverages you can eat. Contact a dietitian for more information. What foods should I avoid? Fruits Fruits canned with  syrup. Vegetables Canned vegetables. Frozen vegetables with butter or cream sauce. Grains Refined white flour and flour products such as bread, pasta, snack foods, and cereals. Avoid all processed foods. Meats and other proteins Fatty cuts of meat. Poultry with skin. Breaded or fried meats. Processed meat. Avoid saturated fats. Dairy Full-fat yogurt, cheese, or milk. Beverages Sweetened drinks, such as soda or iced tea. The items listed above may not be a complete list of foods and beverages you should avoid. Contact a dietitian for more information. Questions to ask a health care provider  Do I need to meet with a diabetes educator?  Do I need to meet with a dietitian?  What number can I call if I have questions?  When are the best times to check my blood glucose? Where to find more information:  American Diabetes Association: diabetes.org  Academy of Nutrition and Dietetics: www.eatright.org  National Institute of Diabetes and Digestive and Kidney Diseases: www.niddk.nih.gov  Association of Diabetes Care and Education Specialists: www.diabeteseducator.org Summary  It is important to have healthy eating   habits because your blood sugar (glucose) levels are greatly affected by what you eat and drink.  A healthy meal plan will help you control your blood glucose and maintain a healthy lifestyle.  Your health care provider may recommend that you work with a dietitian to make a meal plan that is best for you.  Keep in mind that carbohydrates (carbs) and alcohol have immediate effects on your blood glucose levels. It is important to count carbs and to use alcohol carefully. This information is not intended to replace advice given to you by your health care provider. Make sure you discuss any questions you have with your health care provider. Document Revised: 06/15/2019 Document Reviewed: 06/15/2019 Elsevier Patient Education  2021 Elsevier Inc.  

## 2020-11-08 NOTE — Telephone Encounter (Signed)
Pt has called in regarding refill and hung up before I could get her profile up said you just tell the dr to have it ready and hung up so could not confirm or let her know an appt was needed.

## 2020-11-08 NOTE — Progress Notes (Signed)
Subjective:  Patient ID: Amy Glass, female    DOB: 22-Dec-1960  Age: 60 y.o. MRN: [id number]  CC: Medication Refill   HPI Amy Glass is a 60 year old female patient of Dr Candi Leash a history of hypertension, type 2 diabetes mellitus (A1c 6.0), anxiety who presents today because she is running low on Janumet and is needing refills. Blood pressure is slightly above goal and she endorses compliance with her antihypertensive. She informs me she is in a hurry and has another appointment to get to it. Denies acute concerns today.  Past Medical History:  Diagnosis Date  . ADHD (attention deficit hyperactivity disorder)   . Allergy   . Anxiety    as child   . Depression    as child  . Diabetes mellitus without complication ([place]) Dx 3086  . Diabetes mellitus, type II ([place])   . Diverticulosis   . Hiatal hernia   . Hyperplastic colon polyp   . Hypertension Dx 2003  . Schatzki's ring     Past Surgical History:  Procedure Laterality Date  . BACK SURGERY  2002   lumbar  . BROW LIFT Bilateral 09/17/2016   Procedure: BLEPHAROPLASTY;  Surgeon: Irene Limbo, MD;  Location: [facility];  Service: Plastics;  Laterality: Bilateral;  . CARPAL TUNNEL RELEASE Right   . CARPAL TUNNEL RELEASE Left 06/2016  . CESAREAN SECTION  05/20/2003   . ESOPHAGEAL MANOMETRY N/A 10/25/2015   Procedure: ESOPHAGEAL MANOMETRY (EM);  Surgeon: Jerene Bears, MD;  Location: [facility];  Service: Gastroenterology;  Laterality: N/A;  . PTOSIS REPAIR Bilateral 09/17/2016   Procedure: BILATERAL PTOSIS REPAIR EYELID WITH SUTURE TECHNIQUE, BILATERAL UPPER LID BLEPHAROPLASTY WITH EXCESS SKIN WEIGHING EYELID DOWN.;  Surgeon: Irene Limbo, MD;  Location: [facility];  Service: Plastics;  Laterality: Bilateral;  . SHOULDER SURGERY  08/2013    b/l shoulder   . SHOULDER SURGERY Right 11/2015    Family History  Problem Relation Age of Onset  . Diabetes Mother   .  Heart disease Mother   . Hyperlipidemia Mother   . Renal cancer Maternal Aunt   . Stomach cancer Maternal Uncle   . Diabetes Maternal Grandmother   . Hyperlipidemia Maternal Grandmother   . Healthy Daughter   . Healthy Daughter   . Healthy Daughter   . Colon cancer Neg Hx   . Esophageal cancer Neg Hx   . Rectal cancer Neg Hx     Allergies  Allergen Reactions  . Nitroglycerin Other (See Comments)    Migraines, (only tried nitro patch. Has never tried the pills)  . Tricor [Fenofibrate]     Stomach upset    Outpatient Medications Prior to Visit  Medication Sig Dispense Refill  . ACCU-CHEK FASTCLIX LANCETS MISC Use as directed to test blood sugar once daily 100 each 12  . amLODipine (NORVASC) 5 MG tablet Take 1 tablet (5 mg total) by mouth daily. 90 tablet 1  . atorvastatin (LIPITOR) 20 MG tablet Take 1 tablet (20 mg total) by mouth daily. 90 tablet 1  . Blood Glucose Monitoring Suppl (ACCU-CHEK GUIDE) w/Device KIT 1 each by Does not apply route daily. Use as directed to test blood sugar once daily 1 kit 0  . fluticasone (FLONASE) 50 MCG/ACT nasal spray Place 2 sprays into both nostrils daily. 16 g 6  . gemfibrozil (LOPID) 600 MG tablet TAKE 1 TABLET (600 MG TOTAL) BY MOUTH 2 (TWO) TIMES DAILY BEFORE A MEAL. 180 tablet 1  .  glucose blood (ACCU-CHEK GUIDE) test strip Use as directed to test blood sugar once daily 100 each 12  . hydrOXYzine (VISTARIL) 25 MG capsule TAKE 1 CAPSULE BY MOUTH 3 TIMES DAILY 90 capsule 1  . Insulin Pen Needle (TRUEPLUS PEN NEEDLES) 31G X 6 MM MISC Use to inject Victoza daily. 100 each 1  . loratadine (CLARITIN) 10 MG tablet TAKE 1 TABLET BY MOUTH EVERY DAY 90 tablet 1  . losartan (COZAAR) 100 MG tablet Take 1 tablet (100 mg total) by mouth daily. 90 tablet 1  . traMADol-acetaminophen (ULTRACET) 37.5-325 MG tablet     . traZODone (DESYREL) 100 MG tablet Take 1 tablet (100 mg total) by mouth at bedtime as needed for sleep. 90 tablet 2  . venlafaxine XR  (EFFEXOR-XR) 75 MG 24 hr capsule TAKE 1 CAPSULE BY MOUTH DAILY WITH BREAKFAST. 30 capsule 0  . JANUMET 50-1000 MG tablet TAKE 1 TABLET EVERY DAY (NEED MD APPOINTMENT FOR REFILLS) 60 tablet 0   No facility-administered medications prior to visit.     ROS Review of Systems  Constitutional: Negative for activity change, appetite change and fatigue.  HENT: Negative for congestion, sinus pressure and sore throat.   Eyes: Negative for visual disturbance.  Respiratory: Negative for cough, chest tightness, shortness of breath and wheezing.   Cardiovascular: Negative for chest pain and palpitations.  Gastrointestinal: Negative for abdominal distention, abdominal pain and constipation.  Endocrine: Negative for polydipsia.  Genitourinary: Negative for dysuria and frequency.  Musculoskeletal: Negative for arthralgias and back pain.  Skin: Negative for rash.  Neurological: Negative for tremors, light-headedness and numbness.  Hematological: Does not bruise/bleed easily.  Psychiatric/Behavioral: Negative for agitation and behavioral problems.    Objective:  BP (!) 148/89   Pulse 98   Resp 20   Ht 5' (1.524 m)   Wt 133 lb (60.3 kg)   LMP 05/30/2015   SpO2 96%   BMI 25.97 kg/m   BP/Weight 11/08/2020 07/28/2020 14/43/1540  Systolic BP 086 761 950  Diastolic BP 89 932 93  Wt. (Lbs) 133 142.4 140.2  BMI 25.97 28.76 28.32  Some encounter information is confidential and restricted. Go to Review Flowsheets activity to see all data.      Physical Exam Constitutional:      Appearance: She is well-developed.  Neck:     Vascular: No JVD.  Cardiovascular:     Rate and Rhythm: Normal rate.     Heart sounds: Normal heart sounds. No murmur heard.   Pulmonary:     Effort: Pulmonary effort is normal.     Breath sounds: Normal breath sounds. No wheezing or rales.  Chest:     Chest wall: No tenderness.  Abdominal:     General: Bowel sounds are normal. There is no distension.     Palpations:  Abdomen is soft. There is no mass.     Tenderness: There is no abdominal tenderness.  Musculoskeletal:        General: Normal range of motion.     Right lower leg: No edema.     Left lower leg: No edema.  Neurological:     Mental Status: She is alert and oriented to person, place, and time.  Psychiatric:        Mood and Affect: Mood normal.     CMP Latest Ref Rng & Units 04/28/2020 09/21/2019 02/12/2019  Glucose 65 - 99 mg/dL 121(H) - 101(H)  BUN 6 - 24 mg/dL 12 - 12  Creatinine 0.57 - 1.00 mg/dL  0.71 - 0.95  Sodium 134 - 144 mmol/L 140 - 134(L)  Potassium 3.5 - 5.2 mmol/L 4.6 - 3.7  Chloride 96 - 106 mmol/L 103 - 105  CO2 20 - 29 mmol/L 22 - 18(L)  Calcium 8.7 - 10.2 mg/dL 9.4 - 9.2  Total Protein 6.0 - 8.5 g/dL 7.3 8.0 7.8  Total Bilirubin 0.0 - 1.2 mg/dL 0.3 0.6 0.7  Alkaline Phos 44 - 121 IU/L 78 120 68  AST 0 - 40 IU/L _0 ALT 0 - 32 IU/L 21 39 33    Lipid Panel     Component Value Date/Time   CHOL 278 (H) 04/28/2020 1033   TRIG 940 (HH) 04/28/2020 1033   HDL 23 (L) 04/28/2020 1033   CHOLHDL 12.1 (H) 04/28/2020 1033   CHOLHDL 6.9 09/21/2019 0715   VLDL UNABLE TO CALCULATE IF TRIGLYCERIDE OVER 400 mg/dL 09/21/2019 0715   LDLCALC Comment (A) 04/28/2020 1033   LDLDIRECT 89.0 09/21/2019 0715    CBC    Component Value Date/Time   WBC 4.8 09/21/2019 0715   RBC 4.83 09/21/2019 0715   HGB 12.9 09/21/2019 0715   HGB 13.5 01/28/2019 1717   HCT 40.1 09/21/2019 0715   HCT 39.4 01/28/2019 1717   PLT 272 09/21/2019 0715   PLT 222 01/28/2019 1717   MCV 83.0 09/21/2019 0715   MCV 86 01/28/2019 1717   MCH 26.7 09/21/2019 0715   MCHC 32.2 09/21/2019 0715   RDW 13.0 09/21/2019 0715   RDW 13.7 01/28/2019 1717   LYMPHSABS 2.5 06/06/2016 2338   MONOABS 0.4 06/06/2016 2338   EOSABS 0.1 06/06/2016 2338   BASOSABS 0.1 06/06/2016 2338    Lab Results  Component Value Date   HGBA1C 6.0 11/08/2020    Assessment & Plan:  1. Type 2 diabetes mellitus with other specified  complication, without long-term current use of insulin (HCC) Controlled with A1c of 6.0 I have refilled Janumet Counseled on Diabetic diet, my plate method, 381 minutes of moderate intensity exercise/week Blood sugar logs with fasting goals of 80-120 mg/dl, random of less than 180 and in the event of sugars less than 60 mg/dl or greater than 400 mg/dl encouraged to notify the clinic. Advised on the need for annual eye exams, annual foot exams, Pneumonia vaccine. - sitaGLIPtin-metformin (JANUMET) 50-1000 MG tablet; TAKE 1 TABLET EVERY DAY  Dispense: 30 tablet; Refill: 6 - POCT glucose (manual entry) - POCT glycosylated hemoglobin (Hb A1C)  2. Essential hypertension Slightly above goal She endorses compliance with her antihypertensive Reassess blood pressure at next visit and increase dose if still elevated.   Meds ordered this encounter  Medications  . sitaGLIPtin-metformin (JANUMET) 50-1000 MG tablet    Sig: TAKE 1 TABLET EVERY DAY    Dispense:  30 tablet    Refill:  6    Follow-up: Return in about 3 months (around 02/07/2021) for PCP - medical conditions.       Charlott Rakes, MD, FAAFP. [facility] [place], [place]   11/08/2020, 4:12 PM

## 2020-11-09 NOTE — Telephone Encounter (Signed)
Pt seen Dr. Margarita Rana yesterday 11/08/20 and provider sent rx to pharmacy

## 2020-11-15 DIAGNOSIS — M62830 Muscle spasm of back: Secondary | ICD-10-CM | POA: Diagnosis not present

## 2020-11-15 DIAGNOSIS — Z6826 Body mass index (BMI) 26.0-26.9, adult: Secondary | ICD-10-CM | POA: Diagnosis not present

## 2020-11-15 DIAGNOSIS — M546 Pain in thoracic spine: Secondary | ICD-10-CM | POA: Diagnosis not present

## 2020-11-15 DIAGNOSIS — R030 Elevated blood-pressure reading, without diagnosis of hypertension: Secondary | ICD-10-CM | POA: Diagnosis not present

## 2020-11-15 DIAGNOSIS — M545 Low back pain, unspecified: Secondary | ICD-10-CM | POA: Diagnosis not present

## 2020-11-15 DIAGNOSIS — Z79899 Other long term (current) drug therapy: Secondary | ICD-10-CM | POA: Diagnosis not present

## 2020-11-15 DIAGNOSIS — M542 Cervicalgia: Secondary | ICD-10-CM | POA: Diagnosis not present

## 2020-11-26 ENCOUNTER — Other Ambulatory Visit: Payer: Self-pay | Admitting: Internal Medicine

## 2020-11-26 NOTE — Telephone Encounter (Signed)
Requested Prescriptions  Pending Prescriptions Disp Refills  . atorvastatin (LIPITOR) 20 MG tablet [Pharmacy Med Name: ATORVASTATIN CALCIUM 20 MG Tablet] 90 tablet 1    Sig: TAKE 1 TABLET BY MOUTH DAILY.     Cardiovascular:  Antilipid - Statins Failed - 11/26/2020  5:06 AM      Failed - Total Cholesterol in normal range and within 360 days    Cholesterol, Total  Date Value Ref Range Status  04/28/2020 278 (H) 100 - 199 mg/dL Final         Failed - LDL in normal range and within 360 days    LDL Chol Calc (NIH)  Date Value Ref Range Status  04/28/2020 Comment (A) 0 - 99 mg/dL Final    Comment:    Triglyceride result indicated is too high for an accurate LDL cholesterol estimation.    Direct LDL  Date Value Ref Range Status  09/21/2019 89.0 0 - 99 mg/dL Final    Comment:    Performed at [facility], [place] [address], [place], [place] [address]         Failed - HDL in normal range and within 360 days    HDL  Date Value Ref Range Status  04/28/2020 23 (L) >39 mg/dL Final         Failed - Triglycerides in normal range and within 360 days    Triglycerides  Date Value Ref Range Status  04/28/2020 940 (HH) 0 - 149 mg/dL Final    Comment:    Results confirmed on dilution.          Passed - Patient is not pregnant      Passed - Valid encounter within last 12 months    Recent Outpatient Visits          2 weeks ago Essential hypertension   [place], [place], MD   2 months ago Need for vaccination for zoster   [place], RPH-CPP   4 months ago Need for vaccination for zoster   [place], Stephen L, RPH-CPP   4 months ago Pap smear for cervical cancer screening   [place], MD   5 months ago Hordeolum of right lower eyelid, unspecified hordeolum type   [place], [place], NP

## 2020-12-07 DIAGNOSIS — F902 Attention-deficit hyperactivity disorder, combined type: Secondary | ICD-10-CM | POA: Diagnosis not present

## 2020-12-07 DIAGNOSIS — F411 Generalized anxiety disorder: Secondary | ICD-10-CM | POA: Diagnosis not present

## 2020-12-07 DIAGNOSIS — F331 Major depressive disorder, recurrent, moderate: Secondary | ICD-10-CM | POA: Diagnosis not present

## 2020-12-15 ENCOUNTER — Other Ambulatory Visit: Payer: Self-pay | Admitting: Internal Medicine

## 2020-12-15 DIAGNOSIS — Z6826 Body mass index (BMI) 26.0-26.9, adult: Secondary | ICD-10-CM | POA: Diagnosis not present

## 2020-12-15 DIAGNOSIS — M542 Cervicalgia: Secondary | ICD-10-CM | POA: Diagnosis not present

## 2020-12-15 DIAGNOSIS — M62830 Muscle spasm of back: Secondary | ICD-10-CM | POA: Diagnosis not present

## 2020-12-15 DIAGNOSIS — R030 Elevated blood-pressure reading, without diagnosis of hypertension: Secondary | ICD-10-CM | POA: Diagnosis not present

## 2020-12-15 DIAGNOSIS — M545 Low back pain, unspecified: Secondary | ICD-10-CM | POA: Diagnosis not present

## 2020-12-15 DIAGNOSIS — M546 Pain in thoracic spine: Secondary | ICD-10-CM | POA: Diagnosis not present

## 2020-12-15 DIAGNOSIS — Z79899 Other long term (current) drug therapy: Secondary | ICD-10-CM | POA: Diagnosis not present

## 2021-01-03 DIAGNOSIS — F902 Attention-deficit hyperactivity disorder, combined type: Secondary | ICD-10-CM | POA: Diagnosis not present

## 2021-01-03 DIAGNOSIS — F411 Generalized anxiety disorder: Secondary | ICD-10-CM | POA: Diagnosis not present

## 2021-01-03 DIAGNOSIS — F331 Major depressive disorder, recurrent, moderate: Secondary | ICD-10-CM | POA: Diagnosis not present

## 2021-01-04 ENCOUNTER — Other Ambulatory Visit: Payer: Self-pay | Admitting: Internal Medicine

## 2021-01-04 DIAGNOSIS — I10 Essential (primary) hypertension: Secondary | ICD-10-CM

## 2021-01-08 ENCOUNTER — Other Ambulatory Visit: Payer: Self-pay | Admitting: Internal Medicine

## 2021-01-08 ENCOUNTER — Other Ambulatory Visit: Payer: Self-pay | Admitting: Physician Assistant

## 2021-01-08 DIAGNOSIS — I10 Essential (primary) hypertension: Secondary | ICD-10-CM

## 2021-01-10 DIAGNOSIS — M62830 Muscle spasm of back: Secondary | ICD-10-CM | POA: Diagnosis not present

## 2021-01-10 DIAGNOSIS — M5416 Radiculopathy, lumbar region: Secondary | ICD-10-CM | POA: Diagnosis not present

## 2021-01-10 DIAGNOSIS — Z6827 Body mass index (BMI) 27.0-27.9, adult: Secondary | ICD-10-CM | POA: Diagnosis not present

## 2021-01-10 DIAGNOSIS — M546 Pain in thoracic spine: Secondary | ICD-10-CM | POA: Diagnosis not present

## 2021-01-10 DIAGNOSIS — Z79899 Other long term (current) drug therapy: Secondary | ICD-10-CM | POA: Diagnosis not present

## 2021-01-10 DIAGNOSIS — M542 Cervicalgia: Secondary | ICD-10-CM | POA: Diagnosis not present

## 2021-01-12 DIAGNOSIS — Z79899 Other long term (current) drug therapy: Secondary | ICD-10-CM | POA: Diagnosis not present

## 2021-01-16 ENCOUNTER — Telehealth: Payer: Self-pay | Admitting: Internal Medicine

## 2021-01-16 DIAGNOSIS — E1169 Type 2 diabetes mellitus with other specified complication: Secondary | ICD-10-CM

## 2021-01-16 NOTE — Telephone Encounter (Signed)
Copied from [place] [phone]. Topic: Referral - Request for Referral >> Jan 16, 2021 12:57 PM Tessa Lerner A wrote: Has patient seen PCP for this complaint? Yes.    *If NO, is insurance requiring patient see PCP for this issue before PCP can refer them?  Referral for which specialty: Podiatry   Preferred provider/office: Patient has no preference   Reason for referral: Patient is concerned with their toes

## 2021-01-17 ENCOUNTER — Ambulatory Visit (HOSPITAL_COMMUNITY): Payer: Medicare HMO | Admitting: Licensed Clinical Social Worker

## 2021-01-17 NOTE — Telephone Encounter (Signed)
Will forward to provider to place referral  

## 2021-01-23 ENCOUNTER — Ambulatory Visit: Payer: Medicare HMO | Attending: Internal Medicine | Admitting: Internal Medicine

## 2021-01-23 ENCOUNTER — Other Ambulatory Visit: Payer: Self-pay

## 2021-01-23 DIAGNOSIS — M546 Pain in thoracic spine: Secondary | ICD-10-CM

## 2021-01-23 DIAGNOSIS — E781 Pure hyperglyceridemia: Secondary | ICD-10-CM

## 2021-01-23 DIAGNOSIS — R0781 Pleurodynia: Secondary | ICD-10-CM

## 2021-01-23 NOTE — Progress Notes (Signed)
Virtual Visit via Telephone Note  I connected with Amy Glass on 01/23/2021 at 4:37 p.nm by telephone and verified that I am speaking with the correct person using two identifiers.  This was supposed to be a video visit and we were able to connect for a minute but audio failed so we converted to a telephone visit.  Location: Patient: home Provider: office  Participants: Myself Patient   I discussed the limitations, risks, security and privacy concerns of performing an evaluation and management service by telephone and the availability of in person appointments. I also discussed with the patient that there may be a patient responsible charge related to this service. The patient expressed understanding and agreed to proceed.   History of Present Illness: DM, HTN, hyperTG, depression/anxiety, bipolar disorder, chronic neck and lower back pain.  This is an urgent care visit for back pain.  Patient complains of an "annoying constant pain" on the mid to upper right posterior thorax x 3 wks.  She states the area is located between the inferior end of her bra strap and her waistline.  No initiating factors. Pain is worse with movement and breathing.  It hurts when she takes a deep breath in.  She feels it more with certain movements like twisting of the trunk, getting up from a lying down position and opening her car door.  She denies any shortness of breath, lower extremity edema or recent long distance traveling.  No numbness or tingling.  No dysuria or hematuria.  She has tramadol which she gets from her pain specialist to use as needed for pain.  She states that she does not use it every day but has been taking it for this pain but it is not getting any better. She would like to have an MRI or CATSCAN done.   She would like to have her cholesterol rechecked.  She discontinued taking Lopid because it was upsetting her stomach.  She is taking atorvastatin.   Outpatient Encounter  Medications as of 01/23/2021  Medication Sig   methocarbamol (ROBAXIN) 500 MG tablet Take 1 tablet (500 mg total) by mouth 2 (two) times daily as needed for muscle spasms.   naproxen (NAPROSYN) 500 MG tablet Take 1 tablet (500 mg total) by mouth 2 (two) times daily with a meal.   ACCU-CHEK FASTCLIX LANCETS MISC Use as directed to test blood sugar once daily   amLODipine (NORVASC) 5 MG tablet TAKE 1 TABLET EVERY DAY   atorvastatin (LIPITOR) 20 MG tablet TAKE 1 TABLET BY MOUTH EVERY DAY   Blood Glucose Monitoring Suppl (ACCU-CHEK GUIDE) w/Device KIT 1 each by Does not apply route daily. Use as directed to test blood sugar once daily   fluticasone (FLONASE) 50 MCG/ACT nasal spray Place 2 sprays into both nostrils daily.   gemfibrozil (LOPID) 600 MG tablet TAKE 1 TABLET (600 MG TOTAL) BY MOUTH 2 (TWO) TIMES DAILY BEFORE A MEAL.   glucose blood (ACCU-CHEK GUIDE) test strip Use as directed to test blood sugar once daily   hydrOXYzine (VISTARIL) 25 MG capsule TAKE 1 CAPSULE BY MOUTH 3 TIMES DAILY   Insulin Pen Needle (TRUEPLUS PEN NEEDLES) 31G X 6 MM MISC Use to inject Victoza daily.   loratadine (CLARITIN) 10 MG tablet TAKE 1 TABLET BY MOUTH EVERY DAY   losartan (COZAAR) 100 MG tablet TAKE 1 TABLET BY MOUTH DAILY.   sitaGLIPtin-metformin (JANUMET) 50-1000 MG tablet TAKE 1 TABLET EVERY DAY   traMADol-acetaminophen (ULTRACET) 37.5-325 MG tablet  traZODone (DESYREL) 100 MG tablet Take 1 tablet (100 mg total) by mouth at bedtime as needed for sleep.   venlafaxine XR (EFFEXOR-XR) 75 MG 24 hr capsule TAKE 1 CAPSULE BY MOUTH DAILY WITH BREAKFAST.   No facility-administered encounter medications on file as of 01/23/2021.    Observations/Objective: No direct observation done as this was a telephone encounter.  Assessment and Plan: 1. Acute right-sided thoracic back pain Likely musculoskeletal but I am also concerned about the pleuritic nature. I recommend trial of conservative measures.  We will give a  short course of Naprosyn and Robaxin.  Patient advised that the Robaxin can cause drowsiness. Given the pleuritic features of her pain, I will get a chest x-ray, D-dimer and a CBC.  I do not think an MRI or CAT scan is indicated at this time and I expressed that to the patient.  We will have her follow-up in 2 to 3 weeks to reassess. - naproxen (NAPROSYN) 500 MG tablet; Take 1 tablet (500 mg total) by mouth 2 (two) times daily with a meal.  Dispense: 30 tablet; Refill: 0 - methocarbamol (ROBAXIN) 500 MG tablet; Take 1 tablet (500 mg total) by mouth 2 (two) times daily as needed for muscle spasms.  Dispense: 30 tablet; Refill: 1 - DG Chest 2 View; Future  2. Pleuritic chest pain See #1 above - Basic Metabolic Panel; Future - D-dimer, quantitative; Future - CBC; Future  3. Hypertriglyceridemia - Lipid panel; Future   Follow Up Instructions: 2-3 wks   I discussed the assessment and treatment plan with the patient. The patient was provided an opportunity to ask questions and all were answered. The patient agreed with the plan and demonstrated an understanding of the instructions.   The patient was advised to call back or seek an in-person evaluation if the symptoms worsen or if the condition fails to improve as anticipated.  I  Spent 22 minutes on this telephone encounter  Karle Plumber, MD

## 2021-01-24 ENCOUNTER — Encounter: Payer: Self-pay | Admitting: Internal Medicine

## 2021-01-24 ENCOUNTER — Other Ambulatory Visit: Payer: Self-pay

## 2021-01-24 ENCOUNTER — Ambulatory Visit: Payer: Medicare HMO | Attending: Internal Medicine

## 2021-01-24 ENCOUNTER — Ambulatory Visit (HOSPITAL_COMMUNITY)
Admission: RE | Admit: 2021-01-24 | Discharge: 2021-01-24 | Disposition: A | Discharge status: Home / Self Care | Payer: Medicare HMO | Source: Ambulatory Visit | Attending: Internal Medicine | Admitting: Internal Medicine

## 2021-01-24 DIAGNOSIS — M546 Pain in thoracic spine: Secondary | ICD-10-CM | POA: Insufficient documentation

## 2021-01-24 DIAGNOSIS — R0781 Pleurodynia: Secondary | ICD-10-CM | POA: Diagnosis not present

## 2021-01-24 DIAGNOSIS — E781 Pure hyperglyceridemia: Secondary | ICD-10-CM | POA: Diagnosis not present

## 2021-01-24 DIAGNOSIS — R079 Chest pain, unspecified: Secondary | ICD-10-CM | POA: Diagnosis not present

## 2021-01-24 MED ORDER — NAPROXEN 500 MG PO TABS: 500.0000 mg | ORAL_TABLET | Freq: Two times a day (BID) | ORAL | 0 refills | Status: DC

## 2021-01-24 MED ORDER — METHOCARBAMOL 500 MG PO TABS: 500.0000 mg | ORAL_TABLET | Freq: Two times a day (BID) | ORAL | 1 refills | Status: DC | PRN

## 2021-01-25 ENCOUNTER — Encounter: Payer: Self-pay | Admitting: Internal Medicine

## 2021-01-25 LAB — LIPID PANEL
Chol/HDL Ratio: 6.3 ratio — ABNORMAL HIGH (ref 0.0–4.4)
Cholesterol, Total: 171 mg/dL (ref 100–199)
HDL: 27 mg/dL — ABNORMAL LOW (ref >39)
LDL Chol Calc (NIH): 54 mg/dL (ref 0–99)
Triglycerides: 610 mg/dL (ref 0–149)
VLDL Cholesterol Cal: 90 mg/dL — ABNORMAL HIGH (ref 5–40)

## 2021-01-25 LAB — CBC
Hematocrit: 39.3 % (ref 34.0–46.6)
Hemoglobin: 12.4 g/dL (ref 11.1–15.9)
MCH: 27.3 pg (ref 26.6–33.0)
MCHC: 31.6 g/dL (ref 31.5–35.7)
MCV: 87 fL (ref 79–97)
Platelets: 268 10*3/uL (ref 150–450)
RBC: 4.54 x10E6/uL (ref 3.77–5.28)
RDW: 13.3 % (ref 11.7–15.4)
WBC: 6.7 10*3/uL (ref 3.4–10.8)

## 2021-01-25 LAB — BASIC METABOLIC PANEL
BUN/Creatinine Ratio: 14 (ref 9–23)
BUN: 10 mg/dL (ref 6–24)
CO2: 21 mmol/L (ref 20–29)
Calcium: 9.1 mg/dL (ref 8.7–10.2)
Chloride: 102 mmol/L (ref 96–106)
Creatinine, Ser: 0.73 mg/dL (ref 0.57–1.00)
Glucose: 139 mg/dL — ABNORMAL HIGH (ref 65–99)
Potassium: 4.8 mmol/L (ref 3.5–5.2)
Sodium: 138 mmol/L (ref 134–144)
eGFR: 95 mL/min/{1.73_m2} (ref >59)

## 2021-01-25 LAB — D-DIMER, QUANTITATIVE: D-DIMER: 0.55 mg/L FEU — ABNORMAL HIGH (ref 0.00–0.49)

## 2021-01-26 ENCOUNTER — Encounter: Payer: Self-pay | Admitting: Internal Medicine

## 2021-01-31 ENCOUNTER — Telehealth: Payer: Self-pay | Admitting: Internal Medicine

## 2021-01-31 NOTE — Telephone Encounter (Signed)
Copied from [place] [id number] [id number]. Topic: General - Other >> Jan 25, 2021  3:55 PM Holley Dexter N wrote: Reason for CRM: Pt called in about some results she got back and wanted to speak with someone about getting treatment, about embolism*? Please advise

## 2021-01-31 NOTE — Telephone Encounter (Signed)
Pt has an appt with provider 7/14 at 1050am

## 2021-02-01 ENCOUNTER — Ambulatory Visit: Payer: Medicare HMO | Attending: Internal Medicine | Admitting: Internal Medicine

## 2021-02-01 ENCOUNTER — Encounter: Payer: Self-pay | Admitting: Internal Medicine

## 2021-02-01 ENCOUNTER — Other Ambulatory Visit: Payer: Self-pay

## 2021-02-01 VITALS — BP 139/88 | HR 79 | Resp 16 | Wt 138.0 lb

## 2021-02-01 DIAGNOSIS — E1159 Type 2 diabetes mellitus with other circulatory complications: Secondary | ICD-10-CM

## 2021-02-01 DIAGNOSIS — M546 Pain in thoracic spine: Secondary | ICD-10-CM | POA: Diagnosis not present

## 2021-02-01 DIAGNOSIS — Z794 Long term (current) use of insulin: Secondary | ICD-10-CM | POA: Diagnosis not present

## 2021-02-01 DIAGNOSIS — Z791 Long term (current) use of non-steroidal anti-inflammatories (NSAID): Secondary | ICD-10-CM | POA: Diagnosis not present

## 2021-02-01 DIAGNOSIS — Z23 Encounter for immunization: Secondary | ICD-10-CM | POA: Diagnosis not present

## 2021-02-01 DIAGNOSIS — I152 Hypertension secondary to endocrine disorders: Secondary | ICD-10-CM

## 2021-02-01 DIAGNOSIS — R079 Chest pain, unspecified: Secondary | ICD-10-CM

## 2021-02-01 DIAGNOSIS — M545 Low back pain, unspecified: Secondary | ICD-10-CM | POA: Diagnosis not present

## 2021-02-01 DIAGNOSIS — Z79899 Other long term (current) drug therapy: Secondary | ICD-10-CM | POA: Insufficient documentation

## 2021-02-01 DIAGNOSIS — F319 Bipolar disorder, unspecified: Secondary | ICD-10-CM | POA: Insufficient documentation

## 2021-02-01 DIAGNOSIS — Z1211 Encounter for screening for malignant neoplasm of colon: Secondary | ICD-10-CM

## 2021-02-01 DIAGNOSIS — F419 Anxiety disorder, unspecified: Secondary | ICD-10-CM | POA: Diagnosis not present

## 2021-02-01 DIAGNOSIS — G8929 Other chronic pain: Secondary | ICD-10-CM | POA: Insufficient documentation

## 2021-02-01 DIAGNOSIS — Z1231 Encounter for screening mammogram for malignant neoplasm of breast: Secondary | ICD-10-CM

## 2021-02-01 DIAGNOSIS — E1169 Type 2 diabetes mellitus with other specified complication: Secondary | ICD-10-CM | POA: Diagnosis not present

## 2021-02-01 DIAGNOSIS — I10 Essential (primary) hypertension: Secondary | ICD-10-CM | POA: Diagnosis not present

## 2021-02-01 LAB — GLUCOSE, POCT (MANUAL RESULT ENTRY): POC Glucose: 197 mg/dl — AB (ref 70–99)

## 2021-02-01 MED ORDER — AMLODIPINE BESYLATE 10 MG PO TABS: 10.0000 mg | ORAL_TABLET | Freq: Every day | ORAL | 6 refills | Status: DC
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
## 2021-02-01 NOTE — Patient Instructions (Signed)
I have referred you to the cardiologist. I would hold off on exercising until you see the cardiologist.  Start taking baby aspirin which is 81 mg daily.  Your blood pressure is not well controlled.  We have increase Norvasc to 10 mg daily.  I have referred you for your eye exam and to the gastroenterologist for colon cancer screening.

## 2021-02-02 LAB — MICROSCOPIC EXAMINATION
Casts: NONE SEEN /lpf
Epithelial Cells (non renal): >10 /hpf — AB (ref 0–10)

## 2021-02-02 LAB — MICROALBUMIN / CREATININE URINE RATIO
Creatinine, Urine: 116.9 mg/dL
Microalb/Creat Ratio: 17 mg/g creat (ref 0–29)
Microalbumin, Urine: 19.7 ug/mL

## 2021-02-02 LAB — URINALYSIS, ROUTINE W REFLEX MICROSCOPIC
Bilirubin, UA: NEGATIVE
Glucose, UA: NEGATIVE
Ketones, UA: NEGATIVE
Nitrite, UA: NEGATIVE
Protein,UA: NEGATIVE
RBC, UA: NEGATIVE
Specific Gravity, UA: 1.021 (ref 1.005–1.030)
Urobilinogen, Ur: 0.2 mg/dL (ref 0.2–1.0)
pH, UA: 5.5 (ref 5.0–7.5)

## 2021-02-02 MED ORDER — CIPROFLOXACIN HCL 500 MG PO TABS: 500.0000 mg | ORAL_TABLET | Freq: Two times a day (BID) | ORAL | 0 refills | Status: DC

## 2021-02-02 NOTE — Addendum Note (Signed)
Addended by: [name] on: 02/02/2021 08:17 AM   Modules accepted: Orders

## 2021-02-06 ENCOUNTER — Other Ambulatory Visit: Payer: Self-pay | Admitting: Internal Medicine

## 2021-02-06 DIAGNOSIS — M546 Pain in thoracic spine: Secondary | ICD-10-CM

## 2021-02-06 NOTE — Telephone Encounter (Signed)
Medication Refill - Medication: methocarbamol (ROBAXIN) 500 MG tablet   Has the patient contacted their pharmacy? yes (Agent: If no, request that the patient contact the pharmacy for the refill.) (Agent: If yes, when and what did the pharmacy advise?)  Preferred Pharmacy (with phone number or street name):  [facility] - [place], [place] Phone:  [phone]  Fax:  [phone]      Agent: Please be advised that RX refills may take up to 3 business days. We ask that you follow-up with your pharmacy.

## 2021-02-06 NOTE — Telephone Encounter (Signed)
Requested medication (s) are due for refill today: no  Requested medication (s) are on the active medication list: no  Last refill:  01/23/2021  Future visit scheduled: no  Notes to clinic:  this refill cannot be delegated    Requested Prescriptions  Pending Prescriptions Disp Refills   methocarbamol (ROBAXIN) 500 MG tablet 30 tablet 1    Sig: Take 1 tablet (500 mg total) by mouth 2 (two) times daily as needed for muscle spasms.      Not Delegated - Analgesics:  Muscle Relaxants Failed - 02/06/2021 12:43 PM      Failed - This refill cannot be delegated      Passed - Valid encounter within last 6 months    Recent Outpatient Visits           5 days ago Chest pain in adult   [place], MD   2 weeks ago Acute right-sided thoracic back pain   [place], MD   3 months ago Essential hypertension   [place], [place], MD   4 months ago Need for vaccination for zoster   [place], Stephen L, RPH-CPP   6 months ago Need for vaccination for zoster   [place], RPH-CPP       Future Appointments             In 2 weeks Gilford Rile, Martie Lee, NP [facility]

## 2021-02-06 NOTE — Telephone Encounter (Signed)
Duplicate request

## 2021-02-06 NOTE — Telephone Encounter (Signed)
Medication Refill - Medication:methocarbamol (ROBAXIN) 500 MG  Has the patient contacted their pharmacy? yes (Agent: If no, request that the patient contact the pharmacy for the refill.) (Agent: If yes, when and what did the pharmacy advise?)  Preferred Pharmacy (with phone number or street name):  [facility] ([facility] - [place], [place] Phone:  [phone]  Fax:  [phone]      Agent: Please be advised that RX refills may take up to 3 business days. We ask that you follow-up with your pharmacy.

## 2021-02-07 ENCOUNTER — Telehealth: Payer: Self-pay | Admitting: Internal Medicine

## 2021-02-07 ENCOUNTER — Ambulatory Visit: Payer: Medicare HMO | Admitting: Family Medicine

## 2021-02-07 NOTE — Telephone Encounter (Signed)
Copied from [place] [id number]. Topic: General - Other >> Feb 05, 2021  5:51 PM Pawlus, Brayton Layman A wrote: Reason for CRM: Pt called in and wanted to let Dr Wynetta Emery know her test was "positive for her kidneys", pt wanted a call back to discuss.

## 2021-02-07 NOTE — Telephone Encounter (Signed)
Returned pt call pt didn't answer and was unable to lvm due to vm being full

## 2021-02-09 DIAGNOSIS — M546 Pain in thoracic spine: Secondary | ICD-10-CM | POA: Diagnosis not present

## 2021-02-09 DIAGNOSIS — Z79899 Other long term (current) drug therapy: Secondary | ICD-10-CM | POA: Diagnosis not present

## 2021-02-09 DIAGNOSIS — M5416 Radiculopathy, lumbar region: Secondary | ICD-10-CM | POA: Diagnosis not present

## 2021-02-09 DIAGNOSIS — M62830 Muscle spasm of back: Secondary | ICD-10-CM | POA: Diagnosis not present

## 2021-02-09 DIAGNOSIS — M542 Cervicalgia: Secondary | ICD-10-CM | POA: Diagnosis not present

## 2021-02-09 DIAGNOSIS — Z6827 Body mass index (BMI) 27.0-27.9, adult: Secondary | ICD-10-CM | POA: Diagnosis not present

## 2021-02-12 DIAGNOSIS — Z79899 Other long term (current) drug therapy: Secondary | ICD-10-CM | POA: Diagnosis not present

## 2021-02-13 DIAGNOSIS — F902 Attention-deficit hyperactivity disorder, combined type: Secondary | ICD-10-CM | POA: Diagnosis not present

## 2021-02-13 DIAGNOSIS — F411 Generalized anxiety disorder: Secondary | ICD-10-CM | POA: Diagnosis not present

## 2021-02-13 DIAGNOSIS — F331 Major depressive disorder, recurrent, moderate: Secondary | ICD-10-CM | POA: Diagnosis not present

## 2021-02-17 ENCOUNTER — Encounter: Payer: Self-pay | Admitting: Internal Medicine

## 2021-02-19 ENCOUNTER — Other Ambulatory Visit: Payer: Self-pay

## 2021-02-19 ENCOUNTER — Other Ambulatory Visit: Payer: Self-pay | Admitting: Internal Medicine

## 2021-02-19 ENCOUNTER — Telehealth: Payer: Self-pay | Admitting: Internal Medicine

## 2021-02-19 ENCOUNTER — Ambulatory Visit: Payer: Medicare HMO | Attending: Internal Medicine | Admitting: Internal Medicine

## 2021-02-19 DIAGNOSIS — R109 Unspecified abdominal pain: Secondary | ICD-10-CM | POA: Diagnosis not present

## 2021-02-19 DIAGNOSIS — R1030 Lower abdominal pain, unspecified: Secondary | ICD-10-CM | POA: Diagnosis not present

## 2021-02-19 DIAGNOSIS — M546 Pain in thoracic spine: Secondary | ICD-10-CM

## 2021-02-19 DIAGNOSIS — R1031 Right lower quadrant pain: Secondary | ICD-10-CM

## 2021-02-19 NOTE — Progress Notes (Signed)
Patient ID: Amy Glass, female   DOB: 1961-04-06, 60 y.o.   MRN: [id number] Virtual Visit via Telephone Note  I connected with Amy Glass on 02/19/2021 at 6:06 PM by telephone and verified that I am speaking with the correct person using two identifiers  Location: Patient: home Provider: office  Participants: Myself Patient CMA: Ms. Maryclare Bean interpreter:   I discussed the limitations, risks, security and privacy concerns of performing an evaluation and management service by telephone and the availability of in person appointments. I also discussed with the patient that there may be a patient responsible charge related to this service. The patient expressed understanding and agreed to proceed.   History of Present Illness: DM, HTN, hyperTG, depression/anxiety, bipolar disorder, chronic neck and lower back pain.  Patient was last seen on the 14th of last month.  This is an urgent care visit requested by patient via MyChart message.  On her visit with me last month, she continued to have some acute right-sided thoracic/flank pain.  Urinalysis came back showing leukocytes/6-10 WBCs and crystals.  I placed on ciprofloxacin and treat as an infection. Today she reports that the discomfort in the flank got better with the antibiotics but she continues to have a sensation there.  She describes it as a "current going to the bladder" whenever she drinks fluid and then she has to urinate.  She is seeing white particles in the urine.  She denies any dysuria or hematuria.  No urinary urgency.  She reports some right-sided lower abdominal discomfort this past week.  No fever.  No nausea or vomiting.  She is very anxious and feels that something may be wrong with her kidney.  She states that her aunt had kidney cancer.     Observations/Objective: No direct observation done as this was a telephone encounter.  Results for orders placed or performed in visit on 02/01/21   Microscopic Examination  Result Value Ref Range   WBC, UA 6-10 (A) 0 - 5 /hpf   RBC 0-2 0 - 2 /hpf   Epithelial Cells (non renal) >10 (A) 0 - 10 /hpf   Casts None seen None seen /lpf   Crystals Present (A) N/A   Crystal Type Ammonium Biurate N/A   Bacteria, UA Many (A) None seen/Few  Microalbumin / creatinine urine ratio  Result Value Ref Range   Creatinine, Urine 116.9 Not Estab. mg/dL   Microalbumin, Urine 19.7 Not Estab. ug/mL   Microalb/Creat Ratio 17 0 - 29 mg/g creat  Urinalysis, Routine w reflex microscopic  Result Value Ref Range   Specific Gravity, UA 1.021 1.005 - 1.030   pH, UA 5.5 5.0 - 7.5   Color, UA Yellow Yellow   Appearance Ur Turbid (A) Clear   Leukocytes,UA 2+ (A) Negative   Protein,UA Negative Negative/Trace   Glucose, UA Negative Negative   Ketones, UA Negative Negative   RBC, UA Negative Negative   Bilirubin, UA Negative Negative   Urobilinogen, Ur 0.2 0.2 - 1.0 mg/dL   Nitrite, UA Negative Negative   Microscopic Examination See below:   POCT glucose (manual entry)  Result Value Ref Range   POC Glucose 197 (A) 70 - 99 mg/dl     Assessment and Plan: 1. Right lower quadrant abdominal pain 2. Flank pain -Patient with continued complaints of right flank pain.  She does not have any irritative voiding symptoms but reports seeing particles in her urine.  At this time I will repeat the urinalysis with a  culture and order CT renal stone study. - Urinalysis, Routine w reflex microscopic - Urine Culture - CT RENAL STONE STUDY; Future   Follow Up Instructions:    I discussed the assessment and treatment plan with the patient. The patient was provided an opportunity to ask questions and all were answered. The patient agreed with the plan and demonstrated an understanding of the instructions.   The patient was advised to call back or seek an in-person evaluation if the symptoms worsen or if the condition fails to improve as anticipated.  I  Spent 13  minutes on this telephone encounter  Karle Plumber, MD

## 2021-02-19 NOTE — Telephone Encounter (Signed)
Tried calling pt this a.m at 7:50 a.m and 8:32 a.m in reply to her Mychart message.  Got her VM.  Did not leave a message.  Mychart reply sent earlier.

## 2021-02-20 ENCOUNTER — Ambulatory Visit (INDEPENDENT_AMBULATORY_CARE_PROVIDER_SITE_OTHER): Payer: Medicare HMO | Admitting: Podiatry

## 2021-02-20 ENCOUNTER — Other Ambulatory Visit: Payer: Self-pay

## 2021-02-20 ENCOUNTER — Encounter: Payer: Self-pay | Admitting: Podiatry

## 2021-02-20 DIAGNOSIS — M79674 Pain in right toe(s): Secondary | ICD-10-CM

## 2021-02-20 DIAGNOSIS — B351 Tinea unguium: Secondary | ICD-10-CM

## 2021-02-20 DIAGNOSIS — E119 Type 2 diabetes mellitus without complications: Secondary | ICD-10-CM

## 2021-02-20 DIAGNOSIS — M79675 Pain in left toe(s): Secondary | ICD-10-CM

## 2021-02-20 LAB — URINALYSIS, ROUTINE W REFLEX MICROSCOPIC
Bilirubin, UA: NEGATIVE
Glucose, UA: NEGATIVE
Ketones, UA: NEGATIVE
Nitrite, UA: NEGATIVE
Protein,UA: NEGATIVE
RBC, UA: NEGATIVE
Specific Gravity, UA: 1.019 (ref 1.005–1.030)
Urobilinogen, Ur: 0.2 mg/dL (ref 0.2–1.0)
pH, UA: 7 (ref 5.0–7.5)

## 2021-02-20 LAB — MICROSCOPIC EXAMINATION
Casts: NONE SEEN /lpf
WBC, UA: NONE SEEN /hpf (ref 0–5)
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
## 2021-02-21 LAB — URINE CULTURE

## 2021-02-21 NOTE — Progress Notes (Signed)
Office Visit    Patient Name: Amy Glass Date of Encounter: 02/23/2021  PCP:  [name], MD   [place]  Cardiologist:  Pixie Casino, MD  Advanced Practice Provider:  No care team member to display Electrophysiologist:  None    Chief Complaint    Amy Glass is a 60 y.o. female with a hx of hypertriglyceridemia, chest pain, HTN, anxiety, DM2 presents today for chest pain   Past Medical History    Past Medical History:  Diagnosis Date   ADHD (attention deficit hyperactivity disorder)    Allergy    Anxiety    as child    Depression    as child   Diabetes mellitus without complication ([place]) Dx 6979   Diabetes mellitus, type II ([place])    Diverticulosis    Hiatal hernia    Hyperplastic colon polyp    Hypertension Dx 2003   Schatzki's ring    Past Surgical History:  Procedure Laterality Date   BACK SURGERY  2002   lumbar   BROW LIFT Bilateral 09/17/2016   Procedure: BLEPHAROPLASTY;  Surgeon: Irene Limbo, MD;  Location: [facility];  Service: Plastics;  Laterality: Bilateral;   CARPAL TUNNEL RELEASE Right    CARPAL TUNNEL RELEASE Left 06/2016   CESAREAN SECTION  05/20/2003    ESOPHAGEAL MANOMETRY N/A 10/25/2015   Procedure: ESOPHAGEAL MANOMETRY (EM);  Surgeon: Jerene Bears, MD;  Location: [facility];  Service: Gastroenterology;  Laterality: N/A;   PTOSIS REPAIR Bilateral 09/17/2016   Procedure: BILATERAL PTOSIS REPAIR EYELID WITH SUTURE TECHNIQUE, BILATERAL UPPER LID BLEPHAROPLASTY WITH EXCESS SKIN WEIGHING EYELID DOWN.;  Surgeon: Irene Limbo, MD;  Location: [facility];  Service: Plastics;  Laterality: Bilateral;   SHOULDER SURGERY  08/2013    b/l shoulder    SHOULDER SURGERY Right 11/2015    Allergies  Allergies  Allergen Reactions   Nitroglycerin Other (See Comments)    Migraines, (only tried nitro patch. Has never tried the pills)   Tricor [Fenofibrate]     Stomach  upset    History of Present Illness    Amy Glass is a 60 y.o. female with a hx of hypertriglyceridemia, chest pain, HTN, anxiety, DM2 last seen 05/2018 by Dr. Debara Glass.  Previously evaluated by Dr. Debara Glass 05/2018 for hypertriglyceridemia and chest pain. Significant family history of hypertriglyceridemia. Noted previous intolerance of fenofibrate and trouble swallowing gemfibrozil. She was recommended for lexiscsan myoview. She was also recommended to trial splitting gemfibrozil in half and starting Vascepa. Myoview 05/2018 LVEF 62% with normal wall motion and normal perfusion - low risk study.   She has been treated for UTI by her primary care provider. Renal stone study has also been ordered.   She presents today for follow up.  Tells me she gets chest pain that radiates to her neck. She notices this when she runs which she has been trying to do for exercise. Not noted at rest. It self resolves with rest after a few minutes. She is participating in water aerobics. Chest pian does not occur with this activity. She does not check her blood pressure at home. Tells me she follows a heart healthy diet. She does still have trouble swallowing pills but tells me gel coated are often fine. We discussed triglycerides at length today.  EKGs/Labs/Other Studies Reviewed:   The following studies were reviewed today:  EKG:  No EKG is ordered today.  Recent Labs: 04/28/2020: ALT 21  01/24/2021: BUN 10; Creatinine, Ser 0.73; Hemoglobin 12.4; Platelets 268; Potassium 4.8; Sodium 138  Recent Lipid Panel    Component Value Date/Time   CHOL 171 01/24/2021 0957   TRIG 610 (HH) 01/24/2021 0957   HDL 27 (L) 01/24/2021 0957   CHOLHDL 6.3 (H) 01/24/2021 0957   CHOLHDL 6.9 09/21/2019 0715   VLDL UNABLE TO CALCULATE IF TRIGLYCERIDE OVER 400 mg/dL 09/21/2019 0715   LDLCALC 54 01/24/2021 0957   LDLDIRECT 89.0 09/21/2019 0715    Home Medications   Current Meds  Medication Sig   ACCU-CHEK FASTCLIX LANCETS  MISC Use as directed to test blood sugar once daily   amLODipine (NORVASC) 10 MG tablet Take 1 tablet (10 mg total) by mouth daily.   ARIPiprazole (ABILIFY) 2 MG tablet    atorvastatin (LIPITOR) 20 MG tablet TAKE 1 TABLET BY MOUTH EVERY DAY   Blood Glucose Monitoring Suppl (ACCU-CHEK GUIDE) w/Device KIT 1 each by Does not apply route daily. Use as directed to test blood sugar once daily   clonazePAM (KLONOPIN) 0.5 MG tablet    fluticasone (FLONASE) 50 MCG/ACT nasal spray Place 2 sprays into both nostrils daily.   glucose blood (ACCU-CHEK GUIDE) test strip Use as directed to test blood sugar once daily   icosapent Ethyl (VASCEPA) 1 g capsule Take 2 capsules (2 g total) by mouth 2 (two) times daily.   loratadine (CLARITIN) 10 MG tablet TAKE 1 TABLET BY MOUTH EVERY DAY   losartan (COZAAR) 100 MG tablet TAKE 1 TABLET BY MOUTH DAILY.   metoprolol tartrate (LOPRESSOR) 100 MG tablet Take one tablet two hours prior to cardiac CTA.   sitaGLIPtin-metformin (JANUMET) 50-1000 MG tablet TAKE 1 TABLET EVERY DAY   traMADol-acetaminophen (ULTRACET) 37.5-325 MG tablet    traZODone (DESYREL) 100 MG tablet Take 1 tablet (100 mg total) by mouth at bedtime as needed for sleep.   venlafaxine XR (EFFEXOR-XR) 75 MG 24 hr capsule TAKE 1 CAPSULE BY MOUTH DAILY WITH BREAKFAST.     Review of Systems     All other systems reviewed and are otherwise negative except as noted above.  Physical Exam    VS:  BP 104/70   Pulse 93   Ht '4\' 11"'  (1.499 m)   Wt 136 lb 3.2 oz (61.8 kg)   LMP 05/30/2015   SpO2 98%   BMI 27.51 kg/m  , BMI Body mass index is 27.51 kg/m.  Wt Readings from Last 3 Encounters:  02/22/21 136 lb 3.2 oz (61.8 kg)  02/01/21 138 lb (62.6 kg)  11/08/20 133 lb (60.3 kg)     GEN: Well nourished, well developed, in no acute distress. HEENT: normal. Neck: Supple, no JVD, carotid bruits, or masses. Cardiac: RRR, no murmurs, rubs, or gallops. No clubbing, cyanosis, edema.  Radials/PT 2+ and equal  bilaterally.  Respiratory:  Respirations regular and unlabored, clear to auscultation bilaterally. GI: Soft, nontender, nondistended. MS: No deformity or atrophy. Skin: Warm and dry, no rash. Neuro:  Strength and sensation are intact. Psych: Normal affect.  Assessment & Plan    Chest pain in adult -chest pain with running which radiates up to her neck.  Resolves with rest.  Her risk factors include DM2, obesity, hypertriglyceridemia.  Plan for cardiac CTA to rule out ischemia.  DM2 - Continue to follow with PCP.   Hypertriglyceridemia -lipid panel 01/24/2021 with total cholesterol 171, HDL 27, LDL 54, triglycerides 61.  Continue present dose atorvastatin.  Start Vascepa 2 g twice daily.  She does report difficulty  swallowing pills but tells me gel coated capsules work well  Disposition: Follow up in 2 month(s) with Dr. Debara Glass or APP.  Signed, Loel Dubonnet, NP 02/23/2021, 7:47 AM [place]

## 2021-02-22 ENCOUNTER — Encounter (HOSPITAL_BASED_OUTPATIENT_CLINIC_OR_DEPARTMENT_OTHER): Payer: Self-pay | Admitting: Family

## 2021-02-22 ENCOUNTER — Other Ambulatory Visit: Payer: Self-pay

## 2021-02-22 ENCOUNTER — Ambulatory Visit (INDEPENDENT_AMBULATORY_CARE_PROVIDER_SITE_OTHER): Payer: Medicare HMO | Admitting: Family

## 2021-02-22 VITALS — BP 104/70 | HR 93 | Ht 59.0 in | Wt 136.2 lb

## 2021-02-22 DIAGNOSIS — R079 Chest pain, unspecified: Secondary | ICD-10-CM | POA: Diagnosis not present

## 2021-02-22 DIAGNOSIS — E781 Pure hyperglyceridemia: Secondary | ICD-10-CM | POA: Diagnosis not present

## 2021-02-22 MED ORDER — METOPROLOL TARTRATE 100 MG PO TABS: ORAL_TABLET | ORAL | 0 refills | Status: DC

## 2021-02-22 MED ORDER — VASCEPA 1 G PO CAPS: 2.0000 g | ORAL_CAPSULE | Freq: Two times a day (BID) | ORAL | 2 refills | Status: DC

## 2021-02-22 NOTE — Patient Instructions (Addendum)
Medication Instructions:  Your physician has recommended you make the following change in your medication:   START Vascepa 2 tablets twice daily *We are going to work with the insurance company to make this medication affordable  *This to help lower your triglycerides.    *If you need a refill on your cardiac medications before your next appointment, please call your pharmacy*   Lab Work: None ordered today  Testing/Procedures: Your physician has requested that you have cardiac CT. Cardiac computed tomography (CT) is a painless test that uses an x-ray machine to take clear, detailed pictures of your heart. Please follow instruction sheet as given.   Follow-Up: At [facility], you and your health needs are our priority.  As part of our continuing mission to provide you with exceptional heart care, we have created designated Provider Care Teams.  These Care Teams include your primary Cardiologist (physician) and Advanced Practice Providers (APPs -  Physician Assistants and Nurse Practitioners) who all work together to provide you with the care you need, when you need it.  We recommend signing up for the patient portal called "MyChart".  Sign up information is provided on this After Visit Summary.  MyChart is used to connect with patients for Virtual Visits (Telemedicine).  Patients are able to view lab/test results, encounter notes, upcoming appointments, etc.  Non-urgent messages can be sent to your provider as well.   To learn more about what you can do with MyChart, go to [email or web address].    Your next appointment:   2 month(s)  The format for your next appointment:   In Person  Provider:   You may see Pixie Casino, MD or one of the following Advanced Practice Providers on your designated Care Team:   Almyra Deforest, PA-C Fabian Sharp, PA-C or  Roby Lofts, [name] Loel Dubonnet, NP    Other Instructions    Your cardiac CT will be scheduled at one of the below  locations:   [facility] [address] [place], [place] [address] [phone]  If scheduled at [facility], please arrive at the [facility] main entrance (entrance A) of [facility] 30 minutes prior to test start time. Proceed to the [facility] Radiology Department (first floor) to check-in and test prep.  Please follow these instructions carefully (unless otherwise directed):  On the Night Before the Test: Be sure to Drink plenty of water. Do not consume any caffeinated/decaffeinated beverages or chocolate 12 hours prior to your test. Do not take any antihistamines 12 hours prior to your test.   On the Day of the Test: Drink plenty of water until 1 hour prior to the test. Do not eat any food 4 hours prior to the test. You may take your regular medications prior to the test.  Take metoprolol (Lopressor) two hours prior to test. FEMALES- please wear underwire-free bra if available, avoid dresses & tight clothing      After the Test: Drink plenty of water. After receiving IV contrast, you may experience a mild flushed feeling. This is normal. On occasion, you may experience a mild rash up to 24 hours after the test. This is not dangerous. If this occurs, you can take Benadryl 25 mg and increase your fluid intake. If you experience trouble breathing, this can be serious. If it is severe call 911 IMMEDIATELY. If it is mild, please call our office. If you take any of these medications: Glipizide/Metformin, Avandament, Glucavance, please do not take 48  hours after completing test unless otherwise instructed.  Please allow 2-4 weeks for scheduling of routine cardiac CTs. Some insurance companies require a pre-authorization which may delay scheduling of this test.   For non-scheduling related questions, please contact the cardiac imaging nurse navigator should you have any questions/concerns: Marchia Bond, Cardiac Imaging Nurse Navigator Gordy Clement,  Cardiac Imaging Nurse Navigator [facility] Direct Office Dial: [phone]   For scheduling needs, including cancellations and rescheduling, please call [place], [phone].

## 2021-02-23 ENCOUNTER — Telehealth: Payer: Self-pay

## 2021-02-23 ENCOUNTER — Ambulatory Visit
Admission: RE | Admit: 2021-02-23 | Discharge: 2021-02-23 | Disposition: A | Discharge status: Home / Self Care | Payer: Medicare HMO | Source: Ambulatory Visit | Attending: Internal Medicine | Admitting: Internal Medicine

## 2021-02-23 ENCOUNTER — Encounter (HOSPITAL_BASED_OUTPATIENT_CLINIC_OR_DEPARTMENT_OTHER): Payer: Self-pay | Admitting: Family

## 2021-02-23 ENCOUNTER — Telehealth (HOSPITAL_BASED_OUTPATIENT_CLINIC_OR_DEPARTMENT_OTHER): Payer: Self-pay | Admitting: Family

## 2021-02-23 ENCOUNTER — Other Ambulatory Visit (HOSPITAL_BASED_OUTPATIENT_CLINIC_OR_DEPARTMENT_OTHER): Payer: Self-pay | Admitting: Family

## 2021-02-23 DIAGNOSIS — Z1231 Encounter for screening mammogram for malignant neoplasm of breast: Secondary | ICD-10-CM | POA: Diagnosis not present

## 2021-02-23 DIAGNOSIS — Z01812 Encounter for preprocedural laboratory examination: Secondary | ICD-10-CM

## 2021-02-23 NOTE — Telephone Encounter (Signed)
Contacted pt to go over appointment information.    CT is scheduled for August 9 at 5pm at [facility]. Pt will need to arrive at 445pm.   Pt didn't answer and was unable to lvm

## 2021-02-23 NOTE — Telephone Encounter (Signed)
Contacted pt to go over appointment information.   CT is scheduled for August 5 at 5pm at [facility]. Pt will need to arrive at 445pm.   Pt didn't answer and was unable to lvm

## 2021-02-23 NOTE — Addendum Note (Signed)
Addended by: [name] on: 02/23/2021 01:11 PM   Modules accepted: Orders

## 2021-02-23 NOTE — Telephone Encounter (Signed)
Called patient to discuss need for BMP prior to cardiac CTA which is scheduled for 03/05/21.  She tells me she spoke to someone else this morning who was assisting to coordinate at her primary care office. Will forward to her PCP for [facility] as well.   Loel Dubonnet, NP

## 2021-02-23 NOTE — Telephone Encounter (Signed)
Thank you so very much! 

## 2021-02-26 ENCOUNTER — Encounter: Payer: Self-pay | Admitting: Internal Medicine

## 2021-02-26 ENCOUNTER — Other Ambulatory Visit: Payer: Self-pay

## 2021-02-26 ENCOUNTER — Ambulatory Visit: Payer: Medicare HMO | Attending: Internal Medicine

## 2021-02-26 DIAGNOSIS — Z01812 Encounter for preprocedural laboratory examination: Secondary | ICD-10-CM

## 2021-02-27 ENCOUNTER — Encounter: Payer: Self-pay | Admitting: Internal Medicine

## 2021-02-27 ENCOUNTER — Ambulatory Visit (HOSPITAL_COMMUNITY): Payer: Medicare HMO

## 2021-02-27 LAB — BASIC METABOLIC PANEL
BUN/Creatinine Ratio: 17 (ref 9–23)
BUN: 12 mg/dL (ref 6–24)
CO2: 23 mmol/L (ref 20–29)
Calcium: 9.3 mg/dL (ref 8.7–10.2)
Chloride: 102 mmol/L (ref 96–106)
Creatinine, Ser: 0.72 mg/dL (ref 0.57–1.00)
Glucose: 258 mg/dL — ABNORMAL HIGH (ref 65–99)
Potassium: 5.2 mmol/L (ref 3.5–5.2)
Sodium: 138 mmol/L (ref 134–144)
eGFR: 96 mL/min/{1.73_m2} (ref >59)

## 2021-03-01 ENCOUNTER — Telehealth: Payer: Self-pay

## 2021-03-01 NOTE — Telephone Encounter (Signed)
PA has been stared and it needs further clinical information, Agent will fax form over to be completed by PCP.

## 2021-03-02 ENCOUNTER — Ambulatory Visit (HOSPITAL_COMMUNITY): Payer: Medicare HMO

## 2021-03-02 ENCOUNTER — Telehealth (HOSPITAL_COMMUNITY): Payer: Self-pay | Admitting: *Deleted

## 2021-03-02 NOTE — Telephone Encounter (Signed)
Reaching out to patient to offer assistance regarding upcoming cardiac imaging study; pt verbalizes understanding of appt date/time, parking situation and where to check in, pre-test NPO status and medications ordered, and verified current allergies; name and call back number provided for further questions should they arise  Gordy Clement RN Navigator Cardiac Imaging [facility] [phone] office [phone] cell  Patient to take '100mg'$  metoprolol tartrate 2 hours prior to cardiac CT scan.  Pt is aware and willing to take nitroglycerin for test.

## 2021-03-05 ENCOUNTER — Ambulatory Visit (HOSPITAL_COMMUNITY)
Admission: RE | Admit: 2021-03-05 | Discharge: 2021-03-05 | Disposition: A | Discharge status: Home / Self Care | Payer: Medicare HMO | Source: Ambulatory Visit | Attending: Family | Admitting: Family

## 2021-03-05 ENCOUNTER — Other Ambulatory Visit: Payer: Self-pay

## 2021-03-05 DIAGNOSIS — I700 Atherosclerosis of aorta: Secondary | ICD-10-CM | POA: Diagnosis not present

## 2021-03-05 DIAGNOSIS — K449 Diaphragmatic hernia without obstruction or gangrene: Secondary | ICD-10-CM | POA: Insufficient documentation

## 2021-03-05 DIAGNOSIS — R079 Chest pain, unspecified: Secondary | ICD-10-CM | POA: Insufficient documentation

## 2021-03-05 DIAGNOSIS — R918 Other nonspecific abnormal finding of lung field: Secondary | ICD-10-CM | POA: Insufficient documentation

## 2021-03-05 DIAGNOSIS — I2510 Atherosclerotic heart disease of native coronary artery without angina pectoris: Secondary | ICD-10-CM | POA: Insufficient documentation

## 2021-03-05 MED ORDER — NITROGLYCERIN 0.4 MG SL SUBL: 0.8000 mg | SUBLINGUAL_TABLET | Freq: Once | SUBLINGUAL | Status: AC

## 2021-03-05 MED ORDER — NITROGLYCERIN 0.4 MG SL SUBL
SUBLINGUAL_TABLET | SUBLINGUAL | Status: AC
  Administered 2021-03-05: 0.8 mg via SUBLINGUAL
  Filled 2021-03-05: qty 2

## 2021-03-05 MED ORDER — IOHEXOL 350 MG/ML SOLN
95.0000 mL | Freq: Once | INTRAVENOUS | Status: AC | PRN
  Administered 2021-03-05: 95 mL via INTRAVENOUS

## 2021-03-09 ENCOUNTER — Ambulatory Visit (HOSPITAL_COMMUNITY): Payer: Medicare HMO

## 2021-03-13 ENCOUNTER — Ambulatory Visit (INDEPENDENT_AMBULATORY_CARE_PROVIDER_SITE_OTHER): Payer: Medicare HMO | Admitting: Podiatry

## 2021-03-13 ENCOUNTER — Encounter: Payer: Self-pay | Admitting: Podiatry

## 2021-03-13 ENCOUNTER — Ambulatory Visit (INDEPENDENT_AMBULATORY_CARE_PROVIDER_SITE_OTHER): Payer: Medicare HMO

## 2021-03-13 ENCOUNTER — Other Ambulatory Visit: Payer: Self-pay

## 2021-03-13 DIAGNOSIS — M2011 Hallux valgus (acquired), right foot: Secondary | ICD-10-CM | POA: Diagnosis not present

## 2021-03-13 DIAGNOSIS — S93492S Sprain of other ligament of left ankle, sequela: Secondary | ICD-10-CM

## 2021-03-13 DIAGNOSIS — L600 Ingrowing nail: Secondary | ICD-10-CM

## 2021-03-13 DIAGNOSIS — M25372 Other instability, left ankle: Secondary | ICD-10-CM

## 2021-03-13 DIAGNOSIS — M7752 Other enthesopathy of left foot: Secondary | ICD-10-CM | POA: Diagnosis not present

## 2021-03-13 NOTE — Patient Instructions (Signed)
 Chronic Ankle Instability & Rehab Chronic Ankle Instability Chronic ankle instability is a condition that makes the ankle weak and more likely to give way. The condition is common among athletes, especially those with prior ankle ligament injury. Ligaments are strong tissues that connectbones to each other. What are the causes?  This condition is caused by multiple ankle sprains that have not healedproperly, leaving the ankle ligaments loose or damaged. What increases the risk? This condition is more likely to develop in people who participate in sports in which there is a risk of spraining an ankle. These sports include: Cross-country trail running. Basketball. Baseball. Tennis. Football. Soccer. What are the signs or symptoms? Symptoms of this condition include: Rolling your ankle repeatedly. Swelling. Pain. Bruising. Tenderness. Feeling wobbly or unsteady on your foot. Difficulty walking on uneven surfaces or in the dark. How is this diagnosed? This condition may be diagnosed based on: Your symptoms. Your medical history. A physical exam. Your health care provider will check your balance, strength, and range of motion. He or she will also check your injured ankle against your healthy ankle. Imaging tests, such as: An X-ray. A CT scan. An MRI. An ultrasound. How is this treated? Treatment for this condition may include: Wearing a removable boot, brace, or splint. Wearing supportive shoes or shoe inserts. Applying ice to the ankle to reduce swelling. Taking anti-inflammatory pain medicine. Doing exercises (physical therapy). Not putting any body weight, or putting only limited body weight, on your ankle for several days. Gradually returning to full activity. Surgery to repair damaged ligaments. Usually, surgery is needed only if the condition is severe or if othertreatments do not work. Follow these instructions at home: If you have a boot, brace, or splint: Wear it  as told by your health care provider. Remove it only as told by your health care provider. Loosen it if your toes tingle, become numb, or turn cold and blue. Keep it clean. If it is not waterproof: Do not let it get wet. Cover it with a watertight covering when you take a bath or a shower. Ask your health care provider when it is safe to drive with a boot, brace, or splint on your foot. Managing pain, stiffness, and swelling  If directed, put ice on the injured area. If you have a removable boot, brace, or splint, remove it as told by your health care provider. Put ice in a plastic bag. Place a towel between your skin and the bag. Leave the ice on for 20 minutes, 2-3 times a day. Move your toes, foot, and ankle often to reduce stiffness and swelling. Raise (elevate) the injured area above the level of your heart while you are sitting or lying down.  Activity Return to your normal activities as told by your health care provider. Ask your health care provider what activities are safe for you. Do not put your full body weight on your ankle until your health care provider says that you can. Do not do any activities that make pain or swelling worse. Do exercises as told by your health care provider. General instructions Take over-the-counter and prescription medicines only as told by your health care provider. Wear supportive shoes or inserts as told by your health care provider. Keep all follow-up visits as told by your health care provider. This is important. How is this prevented? Wear supportive footwear that is appropriate for your athletic activity. Avoid athletic activities that cause pain or swelling in your ankle. See your health   care provider if you have an ankle sprain that causes pain and swelling for more than 2-4 weeks. Do ankle range-of-motion and strengthening exercises as told by your health care provider before beginning any athletic activity. If you start a new athletic  activity, start gradually to build up your strength and flexibility. Contact a health care provider if: Your condition is not getting better after 2-4 weeks of treatment. You cannot put body weight on your ankle without feeling more pain. Summary Chronic ankle instability is a condition that makes the ankle weak and more likely to give way. This condition is caused by multiple ankle sprains that have not healed properly, leaving the ankle ligaments loose or damaged. Treatment includes wearing a boot, brace, or splint, taking medicines for pain and inflammation, and using ice on the affected area. Follow your health care provider's instructions for caring for your ankle during recovery. Contact your health care provider if your ankle does not get better in 2-4 weeks, or if you cannot put weight on your ankle without feeling more pain. This information is not intended to replace advice given to you by your health care provider. Make sure you discuss any questions you have with your healthcare provider. Document Revised: 04/21/2018 Document Reviewed: 04/21/2018 Elsevier Patient Education  2022 Elsevier Inc.    Ask your health care provider which exercises are safe for you. Do exercises exactly as told by your health care provider and adjust them as directed. It is normal to feel mild stretching, pulling, tightness, or discomfort as you do these exercises. Stop right away if you feel sudden pain or your pain gets worse. Do not begin these exercises until told by your health care provider. Strengthening exercises These exercises build strength and endurance in your ankle. Endurance is theability to use your muscles for a long time, even after they get tired. Ankle eversion Sit on the floor with your legs straight out in front of you. Loop a rubber exercise band around the ball of your left / right foot. The ball of your foot is on the walking surface, right under your toes. Hold the ends of the band  in your hands, or secure the band to a stable object. Slowly push your foot outward, away from your other leg (eversion). Hold this position for 10 seconds. Slowly return your foot to the starting position. Repeat 10 times. Complete this exercise 2 times a day. Heel walking  This exercise strengthens the muscles that push the ankle backward to point your toes toward your knee (ankle dorsiflexors). Walk on your heels for 10 ft. Keep your toes as high as possible. Repeat 10 times. Complete this exercise 2  times per day. Toe walking  This exercise strengthens the muscles that push the ankle forward and your toes downward (ankle plantar flexors). Walk on your toes for 10 ft. Keep your heels as high as possible. Repeat 10  times. Complete this exercise 2  times per day. Balance exercises These exercises improve or maintain your balance. Balance is important inimproving ankle stability and preventing falls. Tandem walking Do this exercise in a hallway or room that is at least 10 ft (3 m) long. Stand with one foot directly in front of the other (tandem). You can use the walls to help you balance if needed, but try not to use them for support. Slowly lift your back foot and place it directly in front of your other foot. Continue to walk in this heel-to-toe way for 10   ft. Repeat 10  times. Complete this exercise 2  times a day. Single leg stand Without shoes, stand near a railing or in a doorway. You may hold on to the railing or door frame as needed for balance. Stand on your left / right foot. Keep your big toe down on the floor and try to keep your arch lifted. If this is too easy, you can try doing it while you do one of these actions: Keep your eyes closed. Stand on a pillow. Throw a ball against a wall and catch it when it returns. Hold this position for 10 seconds. Repeat 10 times. Complete this exercise 2 times a day. Ankle inversion and ankle eversion This exercise is also called  foot rotation with a balance board. It uses a balance board to rotate the foot and ankle inward (inversion) and outward (eversion). Ask your health care provider where you can get a balance board or how you can make one. Stand on a non-carpeted surface near a countertop or wall. Step onto the balance board so your feet are hip width apart. Keep your feet in place, and keep your upper body and hips steady. Using only your feet and ankles to move the board, do the following exercises: Tip the board from side to side as far as you can, alternating between tipping to the left and tipping to the right. Tip the board so it silently taps the floor. Do not let the board forcefully hit the floor. From time to time, pause to hold a steady midway position, with neither the right nor the left sides touching the ground. Tip the board side to side so the board does not hit the floor at all. From time to time, pause to hold a steady midway position. Repeat 10 times, pausing from time to time to hold a steady position.Complete this exercise 2 times a day. Ankle plantar flexion and ankle dorsiflexion This exercise is also called foot flexion with a balance board. It uses a balance board to push the foot downward and away from the leg (plantar flexion) or upward and toward the leg (dorsiflexion). Ask your health care provider where you can get a balance board or how you can make one. Stand on a non-carpeted surface near a countertop or wall. Step onto the balance board so your feet are hip width apart. Keep your feet in place, and keep your upper body and hips steady. Using only your feet and ankles, do the following exercises: Tip the board forward and backward so the board silently taps the floor. Do not let the board forcefully hit the floor. From time to time, pause to hold a steady position midway between touching the floor in front and touching the floor in back. Tip the board forward and backward so the board  does not hit the floor at all. From time to time, pause to hold a steady position. Repeat 10 times, pausing from time to time to hold a steady position.Complete this exercise 2 times a day. This information is not intended to replace advice given to you by your health care provider. Make sure you discuss any questions you have with your healthcare provider. Document Revised: 10/29/2018 Document Reviewed: 04/21/2018 Elsevier Patient Education  2022 Elsevier Inc.  

## 2021-03-15 ENCOUNTER — Encounter: Payer: Self-pay | Admitting: Internal Medicine
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
## 2021-03-16 ENCOUNTER — Other Ambulatory Visit: Payer: Self-pay

## 2021-03-16 ENCOUNTER — Ambulatory Visit (HOSPITAL_COMMUNITY)
Admission: RE | Admit: 2021-03-16 | Discharge: 2021-03-16 | Disposition: A | Discharge status: Home / Self Care | Payer: Medicare HMO | Source: Ambulatory Visit | Attending: Internal Medicine | Admitting: Internal Medicine

## 2021-03-16 DIAGNOSIS — R1031 Right lower quadrant pain: Secondary | ICD-10-CM | POA: Insufficient documentation

## 2021-03-16 DIAGNOSIS — M62830 Muscle spasm of back: Secondary | ICD-10-CM | POA: Diagnosis not present

## 2021-03-16 DIAGNOSIS — R109 Unspecified abdominal pain: Secondary | ICD-10-CM | POA: Diagnosis not present

## 2021-03-16 DIAGNOSIS — M546 Pain in thoracic spine: Secondary | ICD-10-CM | POA: Diagnosis not present

## 2021-03-16 DIAGNOSIS — K449 Diaphragmatic hernia without obstruction or gangrene: Secondary | ICD-10-CM | POA: Diagnosis not present

## 2021-03-16 DIAGNOSIS — K3189 Other diseases of stomach and duodenum: Secondary | ICD-10-CM | POA: Diagnosis not present

## 2021-03-16 DIAGNOSIS — G629 Polyneuropathy, unspecified: Secondary | ICD-10-CM | POA: Diagnosis not present

## 2021-03-16 DIAGNOSIS — Z79899 Other long term (current) drug therapy: Secondary | ICD-10-CM | POA: Diagnosis not present

## 2021-03-16 DIAGNOSIS — K5730 Diverticulosis of large intestine without perforation or abscess without bleeding: Secondary | ICD-10-CM | POA: Diagnosis not present

## 2021-03-16 DIAGNOSIS — K429 Umbilical hernia without obstruction or gangrene: Secondary | ICD-10-CM | POA: Diagnosis not present

## 2021-03-16 DIAGNOSIS — M5416 Radiculopathy, lumbar region: Secondary | ICD-10-CM | POA: Diagnosis not present

## 2021-03-19 ENCOUNTER — Ambulatory Visit: Payer: Medicare HMO | Attending: Family Medicine | Admitting: Family Medicine

## 2021-03-19 ENCOUNTER — Encounter: Payer: Self-pay | Admitting: Family Medicine

## 2021-03-19 ENCOUNTER — Other Ambulatory Visit: Payer: Self-pay

## 2021-03-19 VITALS — BP 115/76 | HR 76 | Ht 59.0 in | Wt 135.0 lb

## 2021-03-19 DIAGNOSIS — S61112A Laceration without foreign body of left thumb with damage to nail, initial encounter: Secondary | ICD-10-CM

## 2021-03-19 NOTE — Progress Notes (Signed)
Subjective:  Patient ID: Amy Glass, female    DOB: 07/23/1960  Age: 60 y.o. MRN: [id number]  CC: Laceration   HPI Amy Glass is a 60 y.o. year old female with a history of hypertension, type 2 diabetes mellitus (A1c 6.0), anxiety.   Interval History: She presents after sustaining a laceration to her left thumb 3 days ago while using a kitchen knife.  She immediately used a dressing to secure hemostasis and later washed out her thumb.  Pain has improved and she denies swelling or fever and has no sensory loss.  She is right-handed. Past Medical History:  Diagnosis Date   ADHD (attention deficit hyperactivity disorder)    Allergy    Anxiety    as child    Depression    as child   Diabetes mellitus without complication ([place]) Dx 9983   Diabetes mellitus, type II ([place])    Diverticulosis    Hiatal hernia    Hyperplastic colon polyp    Hypertension Dx 2003   Schatzki's ring     Past Surgical History:  Procedure Laterality Date   BACK SURGERY  2002   lumbar   BREAST BIOPSY Left 05/11/2018   NON-CASEATING GRANULOMATOUS INFLAMMATION   BROW LIFT Bilateral 09/17/2016   Procedure: BLEPHAROPLASTY;  Surgeon: Irene Limbo, MD;  Location: [facility];  Service: Plastics;  Laterality: Bilateral;   CARPAL TUNNEL RELEASE Right    CARPAL TUNNEL RELEASE Left 06/2016   CESAREAN SECTION  05/20/2003   ESOPHAGEAL MANOMETRY N/A 10/25/2015   Procedure: ESOPHAGEAL MANOMETRY (EM);  Surgeon: Jerene Bears, MD;  Location: [facility];  Service: Gastroenterology;  Laterality: N/A;   PTOSIS REPAIR Bilateral 09/17/2016   Procedure: BILATERAL PTOSIS REPAIR EYELID WITH SUTURE TECHNIQUE, BILATERAL UPPER LID BLEPHAROPLASTY WITH EXCESS SKIN WEIGHING EYELID DOWN.;  Surgeon: Irene Limbo, MD;  Location: [facility];  Service: Plastics;  Laterality: Bilateral;   SHOULDER SURGERY  08/2013   b/l shoulder    SHOULDER SURGERY Right 11/2015    Family  History  Problem Relation Age of Onset   Diabetes Mother    Heart disease Mother    Hyperlipidemia Mother    Healthy Daughter    Healthy Daughter    Healthy Daughter    Renal cancer Maternal Aunt    Stomach cancer Maternal Uncle    Diabetes Maternal Grandmother    Hyperlipidemia Maternal Grandmother    Colon cancer Neg Hx    Esophageal cancer Neg Hx    Rectal cancer Neg Hx    Breast cancer Neg Hx     Allergies  Allergen Reactions   Nitroglycerin Other (See Comments)    Migraines, (only tried nitro patch. Has never tried the pills)   Tricor [Fenofibrate]     Stomach upset    Outpatient Medications Prior to Visit  Medication Sig Dispense Refill   ACCU-CHEK FASTCLIX LANCETS MISC Use as directed to test blood sugar once daily 100 each 12   amLODipine (NORVASC) 10 MG tablet Take 1 tablet (10 mg total) by mouth daily. 30 tablet 6   ARIPiprazole (ABILIFY) 2 MG tablet      atorvastatin (LIPITOR) 20 MG tablet TAKE 1 TABLET BY MOUTH EVERY DAY 90 tablet 1   Blood Glucose Monitoring Suppl (ACCU-CHEK GUIDE) w/Device KIT 1 each by Does not apply route daily. Use as directed to test blood sugar once daily 1 kit 0   clonazePAM (KLONOPIN) 0.5 MG tablet      fluticasone (FLONASE)  50 MCG/ACT nasal spray Place 2 sprays into both nostrils daily. 16 g 6   glucose blood (ACCU-CHEK GUIDE) test strip Use as directed to test blood sugar once daily 100 each 12   icosapent Ethyl (VASCEPA) 1 g capsule Take 2 capsules (2 g total) by mouth 2 (two) times daily. 360 capsule 2   loratadine (CLARITIN) 10 MG tablet TAKE 1 TABLET BY MOUTH EVERY DAY 90 tablet 1   losartan (COZAAR) 100 MG tablet TAKE 1 TABLET BY MOUTH DAILY. 90 tablet 0   metoprolol tartrate (LOPRESSOR) 100 MG tablet Take one tablet two hours prior to cardiac CTA. 1 tablet 0   sitaGLIPtin-metformin (JANUMET) 50-1000 MG tablet TAKE 1 TABLET EVERY DAY 30 tablet 6   traMADol-acetaminophen (ULTRACET) 37.5-325 MG tablet      traZODone (DESYREL) 100 MG  tablet Take 1 tablet (100 mg total) by mouth at bedtime as needed for sleep. 90 tablet 2   venlafaxine XR (EFFEXOR-XR) 75 MG 24 hr capsule TAKE 1 CAPSULE BY MOUTH DAILY WITH BREAKFAST. 30 capsule 0   No facility-administered medications prior to visit.     ROS Review of Systems  Constitutional:  Negative for activity change and appetite change.  HENT:  Negative for sinus pressure and sore throat.   Respiratory:  Negative for chest tightness, shortness of breath and wheezing.   Cardiovascular:  Negative for chest pain and palpitations.  Gastrointestinal:  Negative for abdominal distention, abdominal pain and constipation.  Genitourinary: Negative.   Musculoskeletal: Negative.   Psychiatric/Behavioral:  Negative for behavioral problems and dysphoric mood.    Objective:  BP 115/76   Pulse 76   Ht '4\' 11"'  (1.499 m)   Wt 135 lb (61.2 kg)   LMP 05/30/2015   SpO2 98%   BMI 27.27 kg/m   BP/Weight 03/19/2021 09/28/4074 8/0/8811  Systolic BP 031 594 585  Diastolic BP 76 78 70  Wt. (Lbs) 135 - 136.2  BMI 27.27 - 27.51  Some encounter information is confidential and restricted. Go to Review Flowsheets activity to see all data.      Physical Exam Constitutional:      Appearance: She is well-developed.  Cardiovascular:     Rate and Rhythm: Normal rate.     Heart sounds: Normal heart sounds. No murmur heard. Pulmonary:     Effort: Pulmonary effort is normal.     Breath sounds: Normal breath sounds. No wheezing or rales.  Chest:     Chest wall: No tenderness.  Abdominal:     General: Bowel sounds are normal. There is no distension.     Palpations: Abdomen is soft. There is no mass.     Tenderness: There is no abdominal tenderness.  Musculoskeletal:        General: Normal range of motion.     Right lower leg: No edema.     Left lower leg: No edema.  Skin:    Comments: Laceration on lateral aspect of left thumb extending to mid pulp and nail Sensation is intact and range of  motion of thumb is normal No evidence of edema or erythema, no discharge  Neurological:     Mental Status: She is alert and oriented to person, place, and time.  Psychiatric:        Mood and Affect: Mood normal.    CMP Latest Ref Rng & Units 02/26/2021 01/24/2021 04/28/2020  Glucose 65 - 99 mg/dL 258(H) 139(H) 121(H)  BUN 6 - 24 mg/dL '12 10 12  ' Creatinine 0.57 -  1.00 mg/dL 0.72 0.73 0.71  Sodium 134 - 144 mmol/L 138 138 140  Potassium 3.5 - 5.2 mmol/L 5.2 4.8 4.6  Chloride 96 - 106 mmol/L 102 102 103  CO2 20 - 29 mmol/L '23 21 22  ' Calcium 8.7 - 10.2 mg/dL 9.3 9.1 9.4  Total Protein 6.0 - 8.5 g/dL - - 7.3  Total Bilirubin 0.0 - 1.2 mg/dL - - 0.3  Alkaline Phos 44 - 121 IU/L - - 78  AST 0 - 40 IU/L - - 20  ALT 0 - 32 IU/L - - 21    Lipid Panel     Component Value Date/Time   CHOL 171 01/24/2021 0957   TRIG 610 (HH) 01/24/2021 0957   HDL 27 (L) 01/24/2021 0957   CHOLHDL 6.3 (H) 01/24/2021 0957   CHOLHDL 6.9 09/21/2019 0715   VLDL UNABLE TO CALCULATE IF TRIGLYCERIDE OVER 400 mg/dL 09/21/2019 0715   LDLCALC 54 01/24/2021 0957   LDLDIRECT 89.0 09/21/2019 0715    CBC    Component Value Date/Time   WBC 6.7 01/24/2021 0957   WBC 4.8 09/21/2019 0715   RBC 4.54 01/24/2021 0957   RBC 4.83 09/21/2019 0715   HGB 12.4 01/24/2021 0957   HCT 39.3 01/24/2021 0957   PLT 268 01/24/2021 0957   MCV 87 01/24/2021 0957   MCH 27.3 01/24/2021 0957   MCH 26.7 09/21/2019 0715   MCHC 31.6 01/24/2021 0957   MCHC 32.2 09/21/2019 0715   RDW 13.3 01/24/2021 0957   LYMPHSABS 2.5 06/06/2016 2338   MONOABS 0.4 06/06/2016 2338   EOSABS 0.1 06/06/2016 2338   BASOSABS 0.1 06/06/2016 2338    Lab Results  Component Value Date   HGBA1C 6.0 11/08/2020    Assessment & Plan:  1. Laceration of left thumb without foreign body with damage to nail, initial encounter No evidence of infection or presence of foreign body Advised patient that at the time of injury she should have presented to the UC for  sutures Given this is 72 hours out from injury no indication for sutures as edges are opposed Counseled on ongoing wound care, limit exposure to water Counseled on worsening symptoms and advised to notify the clinic if this occurs for initiation of antibiotics and consideration for referral to hand surgeon.   No orders of the defined types were placed in this encounter.   Follow-up: No follow-ups on file.       Charlott Rakes, MD, FAAFP. [facility] and [facility] [place], [place]   03/19/2021, 3:42 PM

## 2021-03-21 DIAGNOSIS — Z79899 Other long term (current) drug therapy: Secondary | ICD-10-CM | POA: Diagnosis not present

## 2021-03-22 DIAGNOSIS — F902 Attention-deficit hyperactivity disorder, combined type: Secondary | ICD-10-CM | POA: Diagnosis not present

## 2021-03-22 DIAGNOSIS — F331 Major depressive disorder, recurrent, moderate: Secondary | ICD-10-CM | POA: Diagnosis not present

## 2021-03-22 DIAGNOSIS — F411 Generalized anxiety disorder: Secondary | ICD-10-CM | POA: Diagnosis not present

## 2021-03-25 ENCOUNTER — Telehealth: Payer: Self-pay

## 2021-03-25 NOTE — Telephone Encounter (Signed)
Called pt to schedule Annual Wellness Visit, No answer left message to call [facility] to schedule at [phone]  

## 2021-03-27 ENCOUNTER — Ambulatory Visit
Admission: RE | Admit: 2021-03-27 | Discharge: 2021-03-27 | Disposition: A | Discharge status: Home / Self Care | Payer: Medicare HMO | Source: Ambulatory Visit | Attending: Podiatry | Admitting: Podiatry

## 2021-03-27 ENCOUNTER — Other Ambulatory Visit: Payer: Self-pay

## 2021-03-27 DIAGNOSIS — S93492S Sprain of other ligament of left ankle, sequela: Secondary | ICD-10-CM

## 2021-03-27 DIAGNOSIS — S86312A Strain of muscle(s) and tendon(s) of peroneal muscle group at lower leg level, left leg, initial encounter: Secondary | ICD-10-CM | POA: Diagnosis not present

## 2021-03-27 DIAGNOSIS — M7732 Calcaneal spur, left foot: Secondary | ICD-10-CM | POA: Diagnosis not present

## 2021-03-27 DIAGNOSIS — M25372 Other instability, left ankle: Secondary | ICD-10-CM

## 2021-04-08 ENCOUNTER — Telehealth: Payer: Self-pay

## 2021-04-08 NOTE — Telephone Encounter (Signed)
Called pt to schedule Annual Wellness Visit, No answer left message to call [facility] to schedule at [phone]  

## 2021-04-09 ENCOUNTER — Telehealth: Payer: Self-pay | Admitting: Internal Medicine

## 2021-04-09 DIAGNOSIS — E118 Type 2 diabetes mellitus with unspecified complications: Secondary | ICD-10-CM

## 2021-04-09 MED ORDER — ACCU-CHEK GUIDE W/DEVICE KIT: 1.0000 | PACK | Freq: Every day | 0 refills | Status: DC

## 2021-04-09 NOTE — Telephone Encounter (Signed)
Pt needs a new glucose meter and she stated that [employer] has been trying to reach out to provider for a new meter and strips/ pts meter is very old/ please advise and send to  [facility] ([facility] - [place], [place] Phone:  [phone]  Fax:  [phone]

## 2021-04-10 MED ORDER — ACCU-CHEK SOFTCLIX LANCETS MISC: 2 refills | Status: DC

## 2021-04-10 MED ORDER — ACCU-CHEK GUIDE VI STRP: ORAL_STRIP | 2 refills | Status: DC

## 2021-04-10 NOTE — Addendum Note (Signed)
Addended by: Daisy Blossom, Annie Main L on: 04/10/2021 02:22 PM   Modules accepted: Orders

## 2021-04-11 ENCOUNTER — Telehealth: Payer: Self-pay | Admitting: Internal Medicine

## 2021-04-11 DIAGNOSIS — E118 Type 2 diabetes mellitus with unspecified complications: Secondary | ICD-10-CM

## 2021-04-11 DIAGNOSIS — T7840XS Allergy, unspecified, sequela: Secondary | ICD-10-CM

## 2021-04-11 NOTE — Telephone Encounter (Signed)
Copied from [place] [phone]. Topic: Referral - Request for Referral >> Apr 09, 2021 12:46 PM Alanda Slim E wrote: Has patient seen PCP for this complaint? Yes  *If NO, is insurance requiring patient see PCP for this issue before PCP can refer them? Referral for which specialty: allergist  [facility]  Reason for referral: allergies   Pt also needs a referral to [facility] center because she is a diabetic / please advise when referrals are placed

## 2021-04-13 NOTE — Telephone Encounter (Signed)
Will forward to provider  

## 2021-04-16 DIAGNOSIS — M5416 Radiculopathy, lumbar region: Secondary | ICD-10-CM | POA: Diagnosis not present

## 2021-04-16 DIAGNOSIS — E559 Vitamin D deficiency, unspecified: Secondary | ICD-10-CM | POA: Diagnosis not present

## 2021-04-16 DIAGNOSIS — M546 Pain in thoracic spine: Secondary | ICD-10-CM | POA: Diagnosis not present

## 2021-04-16 DIAGNOSIS — G629 Polyneuropathy, unspecified: Secondary | ICD-10-CM | POA: Diagnosis not present

## 2021-04-16 DIAGNOSIS — Z79899 Other long term (current) drug therapy: Secondary | ICD-10-CM | POA: Diagnosis not present

## 2021-04-16 DIAGNOSIS — M542 Cervicalgia: Secondary | ICD-10-CM | POA: Diagnosis not present

## 2021-04-16 DIAGNOSIS — Z6827 Body mass index (BMI) 27.0-27.9, adult: Secondary | ICD-10-CM | POA: Diagnosis not present

## 2021-04-17 ENCOUNTER — Other Ambulatory Visit: Payer: Self-pay

## 2021-04-17 ENCOUNTER — Ambulatory Visit (INDEPENDENT_AMBULATORY_CARE_PROVIDER_SITE_OTHER): Payer: Medicare HMO | Admitting: Podiatry

## 2021-04-17 DIAGNOSIS — M25372 Other instability, left ankle: Secondary | ICD-10-CM

## 2021-04-17 DIAGNOSIS — S93492S Sprain of other ligament of left ankle, sequela: Secondary | ICD-10-CM | POA: Diagnosis not present

## 2021-04-18 NOTE — Progress Notes (Signed)
Subjective:  Patient ID: Amy Glass, female    DOB: 1961-02-03,  MRN: [id number]  Chief Complaint  Patient presents with   Follow-up    Bilateral bunion pain follow  up     60 y.o. female returns for follow-up with the above complaint. History confirmed with patient.  She has multiple issues.  Ankle brace has been helpful but continues to be painful for her she did complete the MRI Objective:  Physical Exam: warm, good capillary refill, no trophic changes or ulcerative lesions, normal DP and PT pulses, and normal sensory exam. Left Foot and ankle: She is incurvated borders of the medial and lateral border of the hallux, pain on palpation of the lateral ankle over the ATFL and CFL.  She has mild laxity of the CFL with inversion, no gross reproducible dislocation but it is painful with an anterior drawer and she has pain along the sinus tarsi and anterior joint line of the ankle.  She has sharp pain with palpation of the syndesmotic interval and compression of the distal leg Right Foot: Incurvated borders medial lateral border of the hallux, she has hallux valgus deformity with a bunion medially and hypermobility of the first ray  Radiographs: Multiple views x-ray of the right foot and left ankle: Left ankle distal fibula with small osteophyte likely sequela of prior trauma, there is minimal tib-fib overlap on the AP view; right foot shows moderate hallux valgus deformity with bunion medially and frontal plane valgus rotation  Study Result  Narrative & Impression  CLINICAL DATA:  Remote chronic injury the ankle, worsening recently.   EXAM: MRI OF THE LEFT ANKLE WITHOUT CONTRAST   TECHNIQUE: Multiplanar, multisequence MR imaging of the ankle was performed. No intravenous contrast was administered.   COMPARISON:  Radiographs 03/13/2021   FINDINGS: TENDONS   Peroneal: Longitudinal partial tearing of the peroneus brevis posterior to the lateral malleolus, with common  peroneus tendon sheath tenosynovitis.   Posteromedial: Flexor hallucis longus tenosynovitis.   Anterior: Unremarkable   Achilles: Minimal linear accentuated signal in the distal Achilles tendon for example on image 16 series 4 compatible with mild tendinopathy.   Plantar Fascia: Mild thickening of the medial band of the plantar fascia with trace adjacent edema, potentially reflecting mild plantar fasciitis.   LIGAMENTS   Lateral: The lower syndesmosis seems intact on image 2 series 4. The inferior tibiofibular ligaments are likewise intact and accordingly we do not demonstrate compelling supportive findings for syndesmotic injury.   The talofibular ligaments and calcaneofibular ligament appear intact.   Medial: Mildly attenuated appearance of the tibionavicular portion of the deltoid ligament, potentially from prior injury. The tibiospring portion and deep tibiotalar portion appear intact. There is some mild thickening of the superomedial portion of the spring ligament.   CARTILAGE   Ankle Joint: Unremarkable   Subtalar Joints/Sinus Tarsi: There is some edema in the sinus tarsi although less than would typically be encountered in sinus tarsi syndrome. Some of this may represent a small effusion from the lateral subtalar joint.   Bones: Plantar calcaneal spur.   Other: No supplemental non-categorized findings.   IMPRESSION: 1. The inferior tibiofibular ligaments appear intact as does the lower margin of the visible syndesmosis, accordingly no specific findings further supportive of syndesmotic injury are shown on today's exam. 2. Longitudinal tearing of the peroneus brevis tendon posterior to the lateral malleolus, with adjacent common peroneus tendon sheath tenosynovitis. 3. Flexor hallucis longus tenosynovitis. 4. Suspected mild plantar fasciitis. 5. Mild distal Achilles tendinopathy.  6. Attenuated appearance of the tibionavicular portion of the deltoid  ligament, potentially from prior injury.     Electronically Signed   By: Van Clines M.D.   Assessment:   1. Syndesmotic ankle sprain, left, sequela   2. Ankle instability, left      Plan:  Patient was evaluated and treated and all questions answered.  She has multiple issues and we tried to address the sequentially in the order of severity:  MRI reviewed with the patient no evidence of syndesmotic or ligamentous injury on the MRI itself.  She does have clinical instability.  I recommend physical therapy for ankle stabilization and strengthening and continue ankle brace as needed. Her bunion is moderately painful and is moderate in size with frontal plane rotation.  She has decent amount of hypermobility.  I think she likely will need a Lapidus arthrodesis to correct the deformity and hypermobility.  I recommended we readdress this once the ankle has been addressed and we will discuss it further at that time.  She will let me know when she has a good time to do the partial nail avulsions of both big toes  Return in about 2 months (around 06/17/2021) for check left ankle .

## 2021-04-20 DIAGNOSIS — F331 Major depressive disorder, recurrent, moderate: Secondary | ICD-10-CM | POA: Diagnosis not present

## 2021-04-20 DIAGNOSIS — F411 Generalized anxiety disorder: Secondary | ICD-10-CM | POA: Diagnosis not present

## 2021-04-20 DIAGNOSIS — F902 Attention-deficit hyperactivity disorder, combined type: Secondary | ICD-10-CM | POA: Diagnosis not present

## 2021-04-23 ENCOUNTER — Ambulatory Visit (INDEPENDENT_AMBULATORY_CARE_PROVIDER_SITE_OTHER): Payer: Medicare HMO | Admitting: Internal Medicine

## 2021-04-23 ENCOUNTER — Other Ambulatory Visit: Payer: Self-pay

## 2021-04-23 VITALS — BP 124/82 | HR 78 | Ht 59.0 in | Wt 138.0 lb

## 2021-04-23 DIAGNOSIS — R079 Chest pain, unspecified: Secondary | ICD-10-CM

## 2021-04-23 DIAGNOSIS — E781 Pure hyperglyceridemia: Secondary | ICD-10-CM | POA: Diagnosis not present

## 2021-04-23 DIAGNOSIS — I10 Essential (primary) hypertension: Secondary | ICD-10-CM | POA: Diagnosis not present

## 2021-04-23 DIAGNOSIS — E119 Type 2 diabetes mellitus without complications: Secondary | ICD-10-CM

## 2021-04-23 NOTE — Patient Instructions (Signed)
Medication Instructions:  START aspirin 81mg  daily  INCREASE vascepa to 2 capsules twice daily  *If you need a refill on your cardiac medications before your next appointment, please call your pharmacy*   Lab Work: FASTING lab work to check cholesterol in 3 months  If you have labs (blood work) drawn today and your tests are completely normal, you will receive your results only by: [place] (if you have MyChart) OR A paper copy in the mail If you have any lab test that is abnormal or we need to change your treatment, we will call you to review the results.   Testing/Procedures: NONE   Follow-Up: At [facility], you and your health needs are our priority.  As part of our continuing mission to provide you with exceptional heart care, we have created designated Provider Care Teams.  These Care Teams include your primary Cardiologist (physician) and Advanced Practice Providers (APPs -  Physician Assistants and Nurse Practitioners) who all work together to provide you with the care you need, when you need it.  We recommend signing up for the patient portal called "MyChart".  Sign up information is provided on this After Visit Summary.  MyChart is used to connect with patients for Virtual Visits (Telemedicine).  Patients are able to view lab/test results, encounter notes, upcoming appointments, etc.  Non-urgent messages can be sent to your provider as well.   To learn more about what you can do with MyChart, go to [email or web address].    Your next appointment:   6 month(s)  The format for your next appointment:   In Person  Provider:   You may see Pixie Casino, MD or one of the following Advanced Practice Providers on your designated Care Team:   Almyra Deforest, PA-C Fabian Sharp, PA-C or  Roby Lofts, [name]   Other Instructions

## 2021-04-27 NOTE — Progress Notes (Signed)
LIPID CLINIC CONSULT NOTE  Chief Complaint:  Follow-up CTA, hypertriglyceridemia  Primary Care Physician: [name], MD  HPI:  Amy Glass is a 60 y.o. female who is being seen today for the evaluation of high triglycerides at the request of [name], MD.  This is a pleasant 60 year old female whose family is from [place], with a history of high triglycerides for many years, fatty liver disease and elevated liver enzymes.  In addition she has diabetes and hypertension.  She is referred today for evaluation of marked dyslipidemia and hypertriglyceridemia.  In discussing with her recently she has had chest pain as well.  She says she gets pain across her chest in the last few days it is been significantly worse.  It has been going on for several months and is little worse with exertion and relieved by rest.  She has been doing some more activity including exercise at a gym but finds has been a little more difficult to do that.  She also notes trouble swallowing.  She says at times pills get stuck in her throat.  Recently she was started on gemfibrozil since she had a history of stomach upset on fenofibrate.  She says she has difficulty swallowing the gemfibrozil tablets based on their size.  She does have a history of esophageal dilatation in the past, but is not actively seeing a gastroenterologist.  She denies alcohol use.  She reports fairly low-fat diet.  Over the past year however her diet was not as good as it had previously been since she has been going through a divorce.  She recently though has eaten more healthy, worked on weight loss and lost more than 15 pounds.  04/23/2021  [name] returns today for follow-up of cardiac CTA.  She was seen by Laurann Montana, NP on August 4 for me at droppage at the time for chest pain.  He had been describing chest pain that radiates to her neck.  She has been having that more with exertion but it tends to relieved  with rest.  She is able to do water aerobics however without that discomfort.  She also has some issues swallowing pills.  This sounds like a pill esophagitis.  She underwent CT coronary angiography on August 15 which showed a calcium score 53.6, 91st percentile for age and sex matched controls.  Despite the extensive coronary calcium for her age, she did only have minimal to mild nonobstructive coronary disease.  Small right-sided pulmonary nodules were noted with possible 1 year CT follow-up recommended.  Most recent labs in July do show persistently elevated triglycerides of 610, total cholesterol 171, HDL 27 and LDL 54.  She had been started on Vascepa, however she tells me she is only taking 1 g twice a day.Marland Kitchen  PMHx:  Past Medical History:  Diagnosis Date   ADHD (attention deficit hyperactivity disorder)    Allergy    Anxiety    as child    Depression    as child   Diabetes mellitus without complication ([place]) Dx 8811   Diabetes mellitus, type II ([place])    Diverticulosis    Hiatal hernia    Hyperplastic colon polyp    Hypertension Dx 2003   Schatzki's ring     Past Surgical History:  Procedure Laterality Date   BACK SURGERY  2002   lumbar   BREAST BIOPSY Left 05/11/2018   NON-CASEATING GRANULOMATOUS INFLAMMATION   BROW LIFT Bilateral 09/17/2016  Procedure: BLEPHAROPLASTY;  Surgeon: Irene Limbo, MD;  Location: [place];  Service: Plastics;  Laterality: Bilateral;   CARPAL TUNNEL RELEASE Right    CARPAL TUNNEL RELEASE Left 06/2016   CESAREAN SECTION  05/20/2003   ESOPHAGEAL MANOMETRY N/A 10/25/2015   Procedure: ESOPHAGEAL MANOMETRY (EM);  Surgeon: Jerene Bears, MD;  [facility]: [facility];  Service: Gastroenterology;  Laterality: N/A;   PTOSIS REPAIR Bilateral 09/17/2016   Procedure: BILATERAL PTOSIS REPAIR EYELID WITH SUTURE TECHNIQUE, BILATERAL UPPER LID BLEPHAROPLASTY WITH EXCESS SKIN WEIGHING EYELID DOWN.;  Surgeon: Irene Limbo, MD;  Location: [facility];  Service: Plastics;  Laterality: Bilateral;   SHOULDER SURGERY  08/2013   b/l shoulder    SHOULDER SURGERY Right 11/2015    FAMHx:  Family History  Problem Relation Age of Onset   Diabetes Mother    Heart disease Mother    Hyperlipidemia Mother    Healthy Daughter    Healthy Daughter    Healthy Daughter    Renal cancer Maternal Aunt    Stomach cancer Maternal Uncle    Diabetes Maternal Grandmother    Hyperlipidemia Maternal Grandmother    Colon cancer Neg Hx    Esophageal cancer Neg Hx    Rectal cancer Neg Hx    Breast cancer Neg Hx     SOCHx:   reports that she has never smoked. She has never used smokeless tobacco. She reports that she does not drink alcohol and does not use drugs.  ALLERGIES:  Allergies  Allergen Reactions   Nitroglycerin Other (See Comments)    Migraines, (only tried nitro patch. Has never tried the pills)   Tricor [Fenofibrate]     Stomach upset    ROS: Pertinent items noted in HPI and remainder of comprehensive ROS otherwise negative.  HOME MEDS: Current Outpatient Medications on File Prior to Visit  Medication Sig Dispense Refill   Accu-Chek Softclix Lancets lancets Use as directed to test blood sugar once daily 100 each 2   amLODipine (NORVASC) 10 MG tablet Take 1 tablet (10 mg total) by mouth daily. 30 tablet 6   amphetamine-dextroamphetamine (ADDERALL) 20 MG tablet      ARIPiprazole (ABILIFY) 2 MG tablet      aspirin EC 81 MG tablet Take 81 mg by mouth daily. Swallow whole.     atorvastatin (LIPITOR) 20 MG tablet TAKE 1 TABLET BY MOUTH EVERY DAY 90 tablet 1   Blood Glucose Monitoring Suppl (ACCU-CHEK GUIDE) w/Device KIT 1 each by Does not apply route daily. Use as directed to test blood sugar once daily 1 kit 0   clonazePAM (KLONOPIN) 0.5 MG tablet      desvenlafaxine (PRISTIQ) 50 MG 24 hr tablet      fluticasone (FLONASE) 50 MCG/ACT nasal spray Place 2 sprays into both nostrils daily. 16 g 6   glucose blood  (ACCU-CHEK GUIDE) test strip Use as directed to test blood sugar once daily 100 each 2   ibuprofen (ADVIL) 800 MG tablet      icosapent Ethyl (VASCEPA) 1 g capsule Take 2 capsules (2 g total) by mouth 2 (two) times daily. 360 capsule 2   loratadine (CLARITIN) 10 MG tablet TAKE 1 TABLET BY MOUTH EVERY DAY 90 tablet 1   losartan (COZAAR) 100 MG tablet TAKE 1 TABLET BY MOUTH DAILY. 90 tablet 0   sitaGLIPtin-metformin (JANUMET) 50-1000 MG tablet TAKE 1 TABLET EVERY DAY 30 tablet 6   traMADol-acetaminophen (ULTRACET) 37.5-325 MG tablet  traZODone (DESYREL) 100 MG tablet Take 1 tablet (100 mg total) by mouth at bedtime as needed for sleep. 90 tablet 2   venlafaxine XR (EFFEXOR-XR) 75 MG 24 hr capsule TAKE 1 CAPSULE BY MOUTH DAILY WITH BREAKFAST. 30 capsule 0   No current facility-administered medications on file prior to visit.    LABS/IMAGING: No results found for this or any previous visit (from the past 48 hour(s)). No results found.  LIPID PANEL:    Component Value Date/Time   CHOL 171 01/24/2021 0957   TRIG 610 (HH) 01/24/2021 0957   HDL 27 (L) 01/24/2021 0957   CHOLHDL 6.3 (H) 01/24/2021 0957   CHOLHDL 6.9 09/21/2019 0715   VLDL UNABLE TO CALCULATE IF TRIGLYCERIDE OVER 400 mg/dL 09/21/2019 0715   LDLCALC 54 01/24/2021 0957   LDLDIRECT 89.0 09/21/2019 0715    WEIGHTS: Wt Readings from Last 3 Encounters:  04/23/21 138 lb (62.6 kg)  03/19/21 135 lb (61.2 kg)  02/22/21 136 lb 3.2 oz (61.8 kg)    VITALS: BP 124/82   Pulse 78   Ht _0  (1.499 m)   Wt 138 lb (62.6 kg)   LMP 05/30/2015   SpO2 98%   BMI 27.87 kg/m   EXAM: General appearance: alert and no distress Neck: no carotid bruit, no JVD and thyroid not enlarged, symmetric, no tenderness/mass/nodules Lungs: clear to auscultation bilaterally Heart: regular rate and rhythm, S1, S2 normal, no murmur, click, rub or gallop Abdomen: soft, non-tender; bowel sounds normal; no masses,  no organomegaly Extremities:  extremities normal, atraumatic, no cyanosis or edema Pulses: 2+ and symmetric Skin: Skin color, texture, turgor normal. No rashes or lesions Neurologic: Grossly normal Psych: Pleasant  EKG: N/A  ASSESSMENT: Primary hypertriglyceridemia Chest pain, nonobstructive coronary disease by CTA (02/2021)-CAC score 53.6, 91st percentile Type 2 diabetes Hypertension  PLAN: 1.   Ms. Villalona likely has noncardiac chest pain although does have significant coronary calcification which is age advanced.  Her CT angiogram of the coronaries was reassuring.  Her chest discomfort is largely resolved.  Her triglycerides remain elevated and I agree with the addition of Vascepa.  Unfortunately she is only been taking 1 g twice a day.  We have encouraged her to increase that to 2 g twice a day and start aspirin 81 mg daily.  We will plan repeat lipids in about 3 months.  Follow-up with me in 6 months or sooner as necessary.  Pixie Casino, MD, [facility], [place] Director of the Advanced Lipid Disorders &  Cardiovascular Risk Reduction Clinic Diplomate of the American Board of Clinical Lipidology Attending Cardiologist  Direct Dial: [phone]  Fax: [phone]  Website:  [email or web address] 04/27/2021, 8:49 PM

## 2021-05-04 ENCOUNTER — Ambulatory Visit (INDEPENDENT_AMBULATORY_CARE_PROVIDER_SITE_OTHER): Payer: Medicare HMO | Admitting: Allergy

## 2021-05-04 ENCOUNTER — Other Ambulatory Visit: Payer: Self-pay

## 2021-05-04 ENCOUNTER — Encounter: Payer: Self-pay | Admitting: Allergy

## 2021-05-04 VITALS — BP 112/80 | HR 82 | Temp 97.8°F | Resp 18 | Ht 59.0 in | Wt 138.0 lb

## 2021-05-04 DIAGNOSIS — J310 Chronic rhinitis: Secondary | ICD-10-CM | POA: Diagnosis not present

## 2021-05-04 DIAGNOSIS — H109 Unspecified conjunctivitis: Secondary | ICD-10-CM | POA: Diagnosis not present

## 2021-05-04 MED ORDER — AZELASTINE HCL 0.15 % NA SOLN: 2.0000 | Freq: Two times a day (BID) | NASAL | 5 refills | Status: DC | PRN

## 2021-05-04 MED ORDER — FEXOFENADINE HCL 180 MG PO TABS: 180.0000 mg | ORAL_TABLET | Freq: Every day | ORAL | 5 refills | Status: DC

## 2021-05-04 MED ORDER — OLOPATADINE HCL 0.2 % OP SOLN: 1.0000 [drp] | OPHTHALMIC | 5 refills | Status: DC

## 2021-05-04 MED ORDER — FLUTICASONE PROPIONATE 50 MCG/ACT NA SUSP: NASAL | 5 refills | Status: DC

## 2021-05-04 NOTE — Patient Instructions (Addendum)
-  will obtain environmental allergy panel via bloodwork today.  If negative then would recommend allergy skin testing off antihistamines -change Claritin to Allegra 180mg  daily at this time.  Allegra is a long-acting antihistamine that may be more effective than Claritin -use Flonase 2 sprays each nostril daily for 1-2 weeks at a time before stopping once nasal congestion improves for maximum benefit -use Astepro 2 sprays each nostril twice a day as needed for nasal drainage control -use Pataday 1 drop each eye daily as needed for itchy/watery eyes -all of these allergy medications are over-the-counter options  Follow-up in 6 months or sooner if needed pending lab results

## 2021-05-04 NOTE — Progress Notes (Signed)
New Patient Note  RE: Amy Glass MRN: [id number] DOB: August 07, 1960 Date of Office Visit: 05/04/2021  Referring provider: [name], MD Primary care provider: [name], MD  Chief Complaint: Allergies  History of present illness: Amy Glass is a 60 y.o. female presenting today for consultation for allergic rhinitis.  Now that the weather is getting colder she is bundling up more.  She states when she starts to feel cold especially in her feet it causes her throat to itch.  She uses an electric blanket as well to help keep her warm.  She states once the throat starts to itch she can then develop sneezing, runny nose and itchy eyes.  She does not typically have any issues during warmer weather months. She uses flonase that helps her nasal symptoms.  She also uses OTC allergy eyedrop that help the itchy eyes.  She has tried claritin and is not sure it helps and she takes it daily to hopefully help prevent symptoms but she still has them anyway.   No history of asthma, eczema or food allergy.   Review of systems: Review of Systems  Constitutional: Negative.   HENT:         See HPI  Eyes:        See HPI  Respiratory: Negative.    Cardiovascular: Negative.   Gastrointestinal: Negative.   Musculoskeletal: Negative.   Skin: Negative.   Neurological: Negative.    All other systems negative unless noted above in HPI  Past medical history: Past Medical History:  Diagnosis Date   ADHD (attention deficit hyperactivity disorder)    Allergy    Anxiety    as child    Depression    as child   Diabetes mellitus without complication ([place]) Dx 1975   Diabetes mellitus, type II ([place])    Diverticulosis    Hiatal hernia    Hyperplastic colon polyp    Hypertension Dx 2003   Schatzki's ring     Past surgical history: Past Surgical History:  Procedure Laterality Date   BACK SURGERY  2002   lumbar   BREAST BIOPSY Left 05/11/2018   NON-CASEATING  GRANULOMATOUS INFLAMMATION   BROW LIFT Bilateral 09/17/2016   Procedure: BLEPHAROPLASTY;  Surgeon: Irene Limbo, MD;  Location: [facility];  Service: Plastics;  Laterality: Bilateral;   CARPAL TUNNEL RELEASE Right    CARPAL TUNNEL RELEASE Left 06/2016   CESAREAN SECTION  05/20/2003   ESOPHAGEAL MANOMETRY N/A 10/25/2015   Procedure: ESOPHAGEAL MANOMETRY (EM);  Surgeon: Jerene Bears, MD;  [facility];  Service: Gastroenterology;  Laterality: N/A;   PTOSIS REPAIR Bilateral 09/17/2016   Procedure: BILATERAL PTOSIS REPAIR EYELID WITH SUTURE TECHNIQUE, BILATERAL UPPER LID BLEPHAROPLASTY WITH EXCESS SKIN WEIGHING EYELID DOWN.;  Surgeon: Irene Limbo, MD;  Location: [facility];  Service: Plastics;  Laterality: Bilateral;   SHOULDER SURGERY  08/2013   b/l shoulder    SHOULDER SURGERY Right 11/2015    Family history:  Family History  Problem Relation Age of Onset   Diabetes Mother    Heart disease Mother    Hyperlipidemia Mother    Healthy Daughter    Healthy Daughter    Healthy Daughter    Renal cancer Maternal Aunt    Stomach cancer Maternal Uncle    Diabetes Maternal Grandmother    Hyperlipidemia Maternal Grandmother    Colon cancer Neg Hx    Esophageal cancer Neg Hx    Rectal cancer  Neg Hx    Breast cancer Neg Hx     Social history: Lives in a home without carpeting with oil heating and window cooling.  Dogs in the home.  She can outside the home.  There is no concern for water damage or mildew in the home.  There is concern for roaches in the home.  She is disabled.  She does not list a smoking history.   Medication List: Current Outpatient Medications  Medication Sig Dispense Refill   Accu-Chek Softclix Lancets lancets Use as directed to test blood sugar once daily 100 each 2   amLODipine (NORVASC) 10 MG tablet Take 1 tablet (10 mg total) by mouth daily. 30 tablet 6   amphetamine-dextroamphetamine (ADDERALL) 20 MG tablet       ARIPiprazole (ABILIFY) 2 MG tablet      aspirin EC 81 MG tablet Take 81 mg by mouth daily. Swallow whole.     atorvastatin (LIPITOR) 20 MG tablet TAKE 1 TABLET BY MOUTH EVERY DAY 90 tablet 1   Blood Glucose Monitoring Suppl (ACCU-CHEK GUIDE) w/Device KIT 1 each by Does not apply route daily. Use as directed to test blood sugar once daily 1 kit 0   desvenlafaxine (PRISTIQ) 50 MG 24 hr tablet      fluticasone (FLONASE) 50 MCG/ACT nasal spray Place 2 sprays into both nostrils daily. 16 g 6   glucose blood (ACCU-CHEK GUIDE) test strip Use as directed to test blood sugar once daily 100 each 2   icosapent Ethyl (VASCEPA) 1 g capsule Take 2 capsules (2 g total) by mouth 2 (two) times daily. 360 capsule 2   loratadine (CLARITIN) 10 MG tablet TAKE 1 TABLET BY MOUTH EVERY DAY 90 tablet 1   losartan (COZAAR) 100 MG tablet TAKE 1 TABLET BY MOUTH DAILY. 90 tablet 0   sitaGLIPtin-metformin (JANUMET) 50-1000 MG tablet TAKE 1 TABLET EVERY DAY 30 tablet 6   traMADol-acetaminophen (ULTRACET) 37.5-325 MG tablet      traZODone (DESYREL) 100 MG tablet Take 1 tablet (100 mg total) by mouth at bedtime as needed for sleep. 90 tablet 2   venlafaxine XR (EFFEXOR-XR) 75 MG 24 hr capsule TAKE 1 CAPSULE BY MOUTH DAILY WITH BREAKFAST. 30 capsule 0   clonazePAM (KLONOPIN) 0.5 MG tablet  (Patient not taking: Reported on 05/04/2021)     ibuprofen (ADVIL) 800 MG tablet  (Patient not taking: Reported on 05/04/2021)     No current facility-administered medications for this visit.    Known medication allergies: Allergies  Allergen Reactions   Nitroglycerin Other (See Comments)    Migraines, (only tried nitro patch. Has never tried the pills)   Tricor [Fenofibrate]     Stomach upset     Physical examination: Blood pressure 112/80, pulse 82, temperature 97.8 F (36.6 C), resp. rate 18, height '4\' 11"'  (1.499 m), weight 138 lb (62.6 kg), last menstrual period 05/30/2015, SpO2 96 %.  General: Alert, interactive, in no acute  distress. HEENT: PERRLA, TMs pearly gray, turbinates non-edematous without discharge, post-pharynx non erythematous. Neck: Supple without lymphadenopathy. Lungs: Clear to auscultation without wheezing, rhonchi or rales. {no increased work of breathing. CV: Normal S1, S2 without murmurs. Abdomen: Nondistended, nontender. Skin: Warm and dry, without lesions or rashes. Extremities:  No clubbing, cyanosis or edema. Neuro:   Grossly intact.  Diagnositics/Labs: Allergy testing: Unable to perform today due to recent antihistamine use  Assessment and plan: Rhinoconjunctivitis, presumed allergic  -will obtain environmental allergy panel via bloodwork today.  If negative then would  recommend allergy skin testing off antihistamines -change Claritin to Allegra 174m daily at this time.  Allegra is a long-acting antihsitamine that may be more effective than Claritin -use Flonase 2 sprays each nostril daily for 1-2 weeks at a time before stopping once nasal congestion improves for maximum benefit -use Astepro 2 sprays each nostril twice a day as needed for nasal drainage control -use Pataday 1 drop each eye daily as needed for itchy/watery eyes -all of these allergy medications are over-the-counter options  Follow-up in 6 months or sooner if needed pending lab results  I appreciate the opportunity to take part in Amy Glass's care. Please do not hesitate to contact me with questions.  Sincerely,   SPrudy Feeler MD Allergy/Immunology [facility] [place] [place]

## 2021-05-08 ENCOUNTER — Other Ambulatory Visit: Payer: Self-pay

## 2021-05-08 ENCOUNTER — Encounter: Payer: Self-pay | Admitting: Physical Therapy

## 2021-05-08 ENCOUNTER — Ambulatory Visit: Payer: Medicare HMO | Attending: Podiatry | Admitting: Physical Therapy

## 2021-05-08 DIAGNOSIS — R2689 Other abnormalities of gait and mobility: Secondary | ICD-10-CM | POA: Insufficient documentation

## 2021-05-08 DIAGNOSIS — M6281 Muscle weakness (generalized): Secondary | ICD-10-CM | POA: Insufficient documentation

## 2021-05-08 DIAGNOSIS — M25572 Pain in left ankle and joints of left foot: Secondary | ICD-10-CM | POA: Insufficient documentation

## 2021-05-08 DIAGNOSIS — R262 Difficulty in walking, not elsewhere classified: Secondary | ICD-10-CM | POA: Diagnosis not present
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
## 2021-05-08 NOTE — Patient Instructions (Signed)
Access Code: [email or web address] URL: [email or web address] Date: 05/08/2021 Prepared by: Estill Bamberg April Thurnell Garbe  Exercises Isometric Ankle Eversion at Wall - 1 x daily - 7 x weekly - 2 sets - 10 reps Isometric Ankle Inversion - 1 x daily - 7 x weekly - 2 sets - 10 reps Seated Ankle Dorsiflexion with Resistance - 1 x daily - 7 x weekly - 2 sets - 10 reps Heel Raises with Counter Support - 1 x daily - 7 x weekly - 1 sets - 20 reps

## 2021-05-09 DIAGNOSIS — F900 Attention-deficit hyperactivity disorder, predominantly inattentive type: Secondary | ICD-10-CM | POA: Diagnosis not present

## 2021-05-09 DIAGNOSIS — F411 Generalized anxiety disorder: Secondary | ICD-10-CM | POA: Diagnosis not present

## 2021-05-09 DIAGNOSIS — F331 Major depressive disorder, recurrent, moderate: Secondary | ICD-10-CM | POA: Diagnosis not present

## 2021-05-12 LAB — ALLERGENS W/TOTAL IGE AREA 2
Alternaria Alternata IgE: <0.1 kU/L
Aspergillus Fumigatus IgE: <0.1 kU/L
Bermuda Grass IgE: <0.1 kU/L
Cat Dander IgE: 1.47 kU/L — AB
Cedar, Mountain IgE: 0.13 kU/L — AB
Cladosporium Herbarum IgE: <0.1 kU/L
Cockroach, German IgE: 28 kU/L — AB
Common Silver Birch IgE: <0.1 kU/L
Cottonwood IgE: <0.1 kU/L
D Farinae IgE: 2.29 kU/L — AB
D Pteronyssinus IgE: 1.42 kU/L — AB
Dog Dander IgE: 1.86 kU/L — AB
Elm, American IgE: <0.1 kU/L
IgE (Immunoglobulin E), Serum: 147 IU/mL (ref 6–495)
Johnson Grass IgE: <0.1 kU/L
Maple/Box Elder IgE: <0.1 kU/L
Mouse Urine IgE: <0.1 kU/L
Oak, White IgE: <0.1 kU/L
Pecan, Hickory IgE: <0.1 kU/L
Penicillium Chrysogen IgE: <0.1 kU/L
Pigweed, Rough IgE: <0.1 kU/L
Ragweed, Short IgE: 0.54 kU/L — AB
Sheep Sorrel IgE Qn: <0.1 kU/L
Timothy Grass IgE: 0.83 kU/L — AB
White Mulberry IgE: <0.1 kU/L

## 2021-05-14 ENCOUNTER — Other Ambulatory Visit: Payer: Self-pay | Admitting: Family Medicine

## 2021-05-14 DIAGNOSIS — E1169 Type 2 diabetes mellitus with other specified complication: Secondary | ICD-10-CM

## 2021-05-14 NOTE — Telephone Encounter (Signed)
Requested Prescriptions  Pending Prescriptions Disp Refills  . JANUMET 50-1000 MG tablet [Pharmacy Med Name: JANUMET 50-1000 MG Tablet] 90 tablet     Sig: TAKE 1 TABLET EVERY DAY     Endocrinology:  Diabetes - Biguanide + DPP-4 Inhibitor Combos Failed - 05/14/2021  4:18 PM      Failed - HBA1C is between 0 and 7.9 and within 180 days    HbA1c, POC (controlled diabetic range)  Date Value Ref Range Status  11/08/2020 6.0 0.0 - 7.0 % Final         Passed - Cr in normal range and within 360 days    Creat  Date Value Ref Range Status  12/21/2015 0.56 0.50 - 1.05 mg/dL Final   Creatinine, Ser  Date Value Ref Range Status  02/26/2021 0.72 0.57 - 1.00 mg/dL Final         Passed - eGFR in normal range and within 360 days    GFR, Est African American  Date Value Ref Range Status  12/21/2015 >89 >=60 mL/min Final   GFR calc Af Amer  Date Value Ref Range Status  04/28/2020 109 >59 mL/min/1.73 Final    Comment:    **[facility] currently reports eGFR in compliance with the current**   recommendations of the [employer]. [facility] will   update reporting as new guidelines are published from the NKF-ASN   Task force.    GFR, Est Non African American  Date Value Ref Range Status  12/21/2015 >89 >=60 mL/min Final    Comment:      The estimated GFR is a calculation valid for adults (>=70 years old) that uses the CKD-EPI algorithm to adjust for age and sex. It is   not to be used for children, pregnant women, hospitalized patients,    patients on dialysis, or with rapidly changing kidney function. According to the NKDEP, eGFR >89 is normal, 60-89 shows mild impairment, 30-59 shows moderate impairment, 15-29 shows severe impairment and <15 is ESRD.      GFR calc non Af Amer  Date Value Ref Range Status  04/28/2020 94 >59 mL/min/1.73 Final   eGFR  Date Value Ref Range Status  02/26/2021 96 >59 mL/min/1.73 Final         Passed - Valid encounter within last 6 months     Recent Outpatient Visits          1 month ago Laceration of left thumb without foreign body with damage to nail, initial encounter   [place], [name], MD   2 months ago Right lower quadrant abdominal pain   [place], MD   3 months ago Chest pain in adult   [place], MD   3 months ago Acute right-sided thoracic back pain   [place], MD   6 months ago Essential hypertension   [place], Enobong, MD      Future Appointments            In 3 days [name], MD [facility]   In 2 months Hilty, Nadean Corwin, MD [place] [facility], [facility]   In 5 months Padgett, Rae Halsted, MD [facility]

## 2021-05-16 ENCOUNTER — Ambulatory Visit (AMBULATORY_SURGERY_CENTER): Payer: Medicare HMO | Admitting: *Deleted

## 2021-05-16 ENCOUNTER — Other Ambulatory Visit: Payer: Self-pay

## 2021-05-16 VITALS — Ht 59.0 in | Wt 137.0 lb

## 2021-05-16 DIAGNOSIS — Z6827 Body mass index (BMI) 27.0-27.9, adult: Secondary | ICD-10-CM | POA: Diagnosis not present

## 2021-05-16 DIAGNOSIS — Z86010 Personal history of colonic polyps: Secondary | ICD-10-CM

## 2021-05-16 DIAGNOSIS — M5416 Radiculopathy, lumbar region: Secondary | ICD-10-CM | POA: Diagnosis not present

## 2021-05-16 DIAGNOSIS — M546 Pain in thoracic spine: Secondary | ICD-10-CM | POA: Diagnosis not present

## 2021-05-16 DIAGNOSIS — Z79899 Other long term (current) drug therapy: Secondary | ICD-10-CM | POA: Diagnosis not present

## 2021-05-16 DIAGNOSIS — G629 Polyneuropathy, unspecified: Secondary | ICD-10-CM | POA: Diagnosis not present

## 2021-05-16 DIAGNOSIS — M542 Cervicalgia: Secondary | ICD-10-CM | POA: Diagnosis not present

## 2021-05-16 MED ORDER — NA SULFATE-K SULFATE-MG SULF 17.5-3.13-1.6 GM/177ML PO SOLN: 1.0000 | Freq: Once | ORAL | 0 refills | Status: AC

## 2021-05-16 NOTE — Progress Notes (Signed)
No egg or soy allergy known to patient  No issues known to pt with past sedation with any surgeries or procedures- pt states hard to sedate  Patient denies ever being told they had issues or difficulty with intubation  No FH of Malignant Hyperthermia Pt is not on diet pills Pt is not on  home 02  Pt is not on blood thinners  Pt denies issues with constipation  No A fib or A flutter  Pt is fully vaccinated  for Covid  NO PA's for preps discussed with pt In PV today  Discussed with pt there will be an out-of-pocket cost for prep and that varies from $0 to 70 +  dollars - pt verbalized understanding   Due to the COVID-19 pandemic we are asking patients to follow certain guidelines in PV and the [place]   Pt aware of COVID protocols and [facility] guidelines   Pt verified name, DOB, address and insurance during PV today.  Pt mailed instruction packet of Emmi video, copy of consent form to read and not return, and instructions.  PV completed over the phone.  Pt encouraged to call with questions or issues.  My Chart instructions to pt as well

## 2021-05-17 ENCOUNTER — Ambulatory Visit: Payer: Medicare HMO | Admitting: Internal Medicine

## 2021-05-17 ENCOUNTER — Other Ambulatory Visit: Payer: Self-pay

## 2021-05-18 ENCOUNTER — Ambulatory Visit: Payer: Medicare HMO | Admitting: Physical Therapy

## 2021-05-18 DIAGNOSIS — F900 Attention-deficit hyperactivity disorder, predominantly inattentive type: Secondary | ICD-10-CM | POA: Diagnosis not present

## 2021-05-18 DIAGNOSIS — F411 Generalized anxiety disorder: Secondary | ICD-10-CM | POA: Diagnosis not present

## 2021-05-18 DIAGNOSIS — F331 Major depressive disorder, recurrent, moderate: Secondary | ICD-10-CM | POA: Diagnosis not present

## 2021-05-21 ENCOUNTER — Encounter: Payer: Medicare HMO | Admitting: Pharmacist

## 2021-05-21 ENCOUNTER — Ambulatory Visit: Payer: Medicare HMO | Admitting: Pharmacist

## 2021-05-21 DIAGNOSIS — Z79899 Other long term (current) drug therapy: Secondary | ICD-10-CM | POA: Diagnosis not present

## 2021-05-22 ENCOUNTER — Ambulatory Visit: Payer: Medicare HMO | Attending: Podiatry | Admitting: Physical Therapy

## 2021-05-22 ENCOUNTER — Other Ambulatory Visit: Payer: Self-pay

## 2021-05-22 ENCOUNTER — Ambulatory Visit: Payer: Medicare HMO | Admitting: Pharmacist

## 2021-05-22 DIAGNOSIS — R2689 Other abnormalities of gait and mobility: Secondary | ICD-10-CM | POA: Insufficient documentation

## 2021-05-22 DIAGNOSIS — M25572 Pain in left ankle and joints of left foot: Secondary | ICD-10-CM | POA: Insufficient documentation

## 2021-05-22 DIAGNOSIS — R262 Difficulty in walking, not elsewhere classified: Secondary | ICD-10-CM | POA: Diagnosis not present

## 2021-05-22 DIAGNOSIS — M6281 Muscle weakness (generalized): Secondary | ICD-10-CM | POA: Diagnosis not present

## 2021-05-22 NOTE — Therapy (Signed)
[place]. [place], [place], [address] Phone: [phone]   Fax:  [phone]  Physical Therapy Treatment  Patient Details  Name: Amy Glass MRN: [id number] Date of Birth: Jun 28, 1961 Referring Provider (PT): Criselda Peaches, [place]   Encounter Date: 05/22/2021   PT End of Session - 05/22/21 1642     Visit Number 2    Number of Visits 9    Date for PT Re-Evaluation 07/03/21    PT Start Time 7622    PT Stop Time 6333    PT Time Calculation (min) 40 min             Past Medical History:  Diagnosis Date   ADHD (attention deficit hyperactivity disorder)    Allergy    Anxiety    as child    Depression    as child   Diabetes mellitus without complication ([place]) Dx 5456   Diabetes mellitus, type II ([place])    Diverticulosis    GERD (gastroesophageal reflux disease)    Hiatal hernia    Hyperlipidemia    Hyperplastic colon polyp    Hypertension Dx 2003   Schatzki's ring     Past Surgical History:  Procedure Laterality Date   BACK SURGERY  2002   lumbar   BREAST BIOPSY Left 05/11/2018   NON-CASEATING GRANULOMATOUS INFLAMMATION   BROW LIFT Bilateral 09/17/2016   Procedure: BLEPHAROPLASTY;  Surgeon: Irene Limbo, MD;  Location: [place];  Service: Plastics;  Laterality: Bilateral;   CARPAL TUNNEL RELEASE Right    CARPAL TUNNEL RELEASE Left 06/2016   CESAREAN SECTION  05/20/2003   COLONOSCOPY     ESOPHAGEAL MANOMETRY N/A 10/25/2015   Procedure: ESOPHAGEAL MANOMETRY (EM);  Surgeon: Jerene Bears, MD;  [facility];  Service: Gastroenterology;  Laterality: N/A;   POLYPECTOMY     PTOSIS REPAIR Bilateral 09/17/2016   Procedure: BILATERAL PTOSIS REPAIR EYELID WITH SUTURE TECHNIQUE, BILATERAL UPPER LID BLEPHAROPLASTY WITH EXCESS SKIN WEIGHING EYELID DOWN.;  Surgeon: Irene Limbo, MD;  Location: [place] [facility];  Service: Plastics;  Laterality: Bilateral;    SHOULDER SURGERY  08/2013   b/l shoulder    SHOULDER SURGERY Right 11/2015   UPPER GASTROINTESTINAL ENDOSCOPY      There were no vitals filed for this visit.   Subjective Assessment - 05/22/21 1615     Subjective pt states doing HEP. verb pain varies with activity and states she has been on her feet alot today    Pain Score 6     Pain Location Foot    Pain Orientation Left                               [facility] Adult PT Treatment/Exercise - 05/22/21 0001       Exercises   Exercises Knee/Hip;Ankle      Modalities   Modalities Iontophoresis      Iontophoresis   Type of Iontophoresis Dexamethasone    Location left ant lateral ankle    Dose 1.2 cc    Time 80 mA 4 hour patch      Manual Therapy   Manual Therapy Joint mobilization;Soft tissue mobilization;Passive ROM    Joint Mobilization left ankle    Soft tissue mobilization lateral LLE    Passive ROM left ankle      Ankle Exercises: Aerobic   Nustep L 5 5 min LE only  Ankle Exercises: Standing   Heel Raises Both;15 reps   on black bar   Toe Raise 15 reps   on black bar     Ankle Exercises: Seated   Other Seated Ankle Exercises dyna disc L ankle 4 way 15x    Other Seated Ankle Exercises DF/PF/INV and EV 2 sets 10 yellow tband                       PT Short Term Goals - 05/22/21 1642       PT SHORT TERM GOAL #1   Title Pt will be IND with HEP    Status Achieved               PT Long Term Goals - 05/08/21 1818       PT LONG TERM GOAL #1   Title Pt will be independent with HEP    Time 8    Period Weeks    Status New    Target Date 07/03/21      PT LONG TERM GOAL #2   Title Pt will decrease pain by at least 50% with bare feet during walking and standing    Time 8    Period Weeks    Status New    Target Date 07/03/21      PT LONG TERM GOAL #3   Title Pt will have L = R ankle strength with no pain on MMT    Time 8    Period Weeks    Status New    Target  Date 07/03/21      PT LONG TERM GOAL #4   Title Pt will be able to perform L SLS = R SLS to demo improved ankle stability    Time 8    Period Weeks    Status New    Target Date 07/03/21                   Plan - 05/22/21 1643     Clinical Impression Statement STG met. Pt tolerated initial ther ex progression well but did nee cuing for compensations and to control ecc mvmt with tband. pt tolerated PROM and joint mobs to Left ankle but limited tolerance to STM to ant lat ankle- very tender. added ionto over thsi area to see if helps pain.    PT Treatment/Interventions ADLs/Self Care Home Management;Aquatic Therapy;Electrical Stimulation;Ultrasound;DME Instruction;Gait training;Stair training;Functional mobility training;Therapeutic activities;Therapeutic exercise;Balance training;Neuromuscular re-education;Manual techniques;Patient/family education;Orthotic Fit/Training;Dry needling;Passive range of motion;Taping    PT Next Visit Plan How was ionto? Progress ankle stabilization as able. Consider use of taping. Assess calcaneal and tibial angle and navicular drop. Assess gait barefoot.             Patient will benefit from skilled therapeutic intervention in order to improve the following deficits and impairments:  Abnormal gait, Decreased range of motion, Decreased activity tolerance, Pain, Decreased balance, Decreased strength, Decreased mobility  Visit Diagnosis: Pain in left ankle and joints of left foot  Other abnormalities of gait and mobility  Muscle weakness (generalized)  Difficulty in walking, not elsewhere classified     Problem List Patient Active Problem List   Diagnosis Date Noted   Pain due to onychomycosis of toenails of both feet 02/20/2021   Diabetes mellitus without complication ([place]) 16/04/9603   Bipolar I disorder with depression ([place]) 02/01/2021   Difficulty concentrating 09/28/2019   Suicidal ideation 09/20/2019   Chest pain 05/28/2018    Dysphagia 05/28/2018  HTN (hypertension) 12/26/2016   Depression 02/08/2016   Rosacea 02/08/2016   Acanthosis nigricans 09/27/2015   TMJ pain dysfunction syndrome 01/16/2015   Poor dentition 01/16/2015   Lumbar degenerative disc disease 01/10/2015   Vitamin D deficiency 12/05/2014   Hypertriglyceridemia 12/05/2014   Diabetes type 2, controlled ([place]) 12/02/2014    Marchell Froman,ANGIE, PTA 05/22/2021, 4:45 PM  [place]. [place], [place], [address] Phone: [phone]   Fax:  [phone]  Name: Amy Glass MRN: [id number] Date of Birth: 01-Feb-1961

## 2021-05-29 ENCOUNTER — Ambulatory Visit: Payer: Medicare HMO | Admitting: Physical Therapy

## 2021-05-30 ENCOUNTER — Encounter: Payer: Self-pay | Admitting: Internal Medicine

## 2021-05-30 ENCOUNTER — Other Ambulatory Visit: Payer: Self-pay

## 2021-05-30 ENCOUNTER — Ambulatory Visit (AMBULATORY_SURGERY_CENTER): Payer: Medicare HMO | Admitting: Internal Medicine

## 2021-05-30 VITALS — BP 119/66 | HR 74 | Temp 97.8°F | Resp 12 | Ht 59.0 in

## 2021-05-30 DIAGNOSIS — E119 Type 2 diabetes mellitus without complications: Secondary | ICD-10-CM | POA: Diagnosis not present

## 2021-05-30 DIAGNOSIS — I10 Essential (primary) hypertension: Secondary | ICD-10-CM | POA: Diagnosis not present

## 2021-05-30 DIAGNOSIS — D124 Benign neoplasm of descending colon: Secondary | ICD-10-CM | POA: Diagnosis not present

## 2021-05-30 DIAGNOSIS — D125 Benign neoplasm of sigmoid colon: Secondary | ICD-10-CM | POA: Diagnosis not present

## 2021-05-30 DIAGNOSIS — Z86010 Personal history of colonic polyps: Secondary | ICD-10-CM

## 2021-05-30 DIAGNOSIS — D128 Benign neoplasm of rectum: Secondary | ICD-10-CM

## 2021-05-30 DIAGNOSIS — E785 Hyperlipidemia, unspecified: Secondary | ICD-10-CM | POA: Diagnosis not present

## 2021-05-30 DIAGNOSIS — K635 Polyp of colon: Secondary | ICD-10-CM | POA: Diagnosis not present

## 2021-05-30 HISTORY — PX: COLONOSCOPY: SHX174

## 2021-05-30 MED ORDER — SODIUM CHLORIDE 0.9 % IV SOLN: 500.0000 mL | Freq: Once | INTRAVENOUS | Status: DC

## 2021-05-30 NOTE — Progress Notes (Signed)
Called to room to assist during endoscopic procedure.  Patient ID and intended procedure confirmed with present staff. Received instructions for my participation in the procedure from the performing physician.  

## 2021-05-30 NOTE — Patient Instructions (Signed)
YOU HAD AN ENDOSCOPIC PROCEDURE TODAY AT THE [facility]:   Refer to the procedure report that was given to you for any specific questions about what was found during the examination.  If the procedure report does not answer your questions, please call your gastroenterologist to clarify.  If you requested that your care partner not be given the details of your procedure findings, then the procedure report has been included in a sealed envelope for you to review at your convenience later.  YOU SHOULD EXPECT: Some feelings of bloating in the abdomen. Passage of more gas than usual.  Walking can help get rid of the air that was put into your GI tract during the procedure and reduce the bloating. If you had a lower endoscopy (such as a colonoscopy or flexible sigmoidoscopy) you may notice spotting of blood in your stool or on the toilet paper. If you underwent a bowel prep for your procedure, you may not have a normal bowel movement for a few days.  Please Note:  You might notice some irritation and congestion in your nose or some drainage.  This is from the oxygen used during your procedure.  There is no need for concern and it should clear up in a day or so.  SYMPTOMS TO REPORT IMMEDIATELY:   Following lower endoscopy (colonoscopy or flexible sigmoidoscopy):  Excessive amounts of blood in the stool  Significant tenderness or worsening of abdominal pains  Swelling of the abdomen that is new, acute  Fever of 100F or higher  For urgent or emergent issues, a gastroenterologist can be reached at any hour by calling [phone]. Do not use MyChart messaging for urgent concerns.    DIET:  We do recommend a small meal at first, but then you may proceed to your regular diet.  Drink plenty of fluids but you should avoid alcoholic beverages for 24 hours.  ACTIVITY:  You should plan to take it easy for the rest of today and you should NOT DRIVE or use heavy machinery until tomorrow (because  of the sedation medicines used during the test).    FOLLOW UP: Our staff will call the number listed on your records 48-72 hours following your procedure to check on you and address any questions or concerns that you may have regarding the information given to you following your procedure. If we do not reach you, we will leave a message.  We will attempt to reach you two times.  During this call, we will ask if you have developed any symptoms of COVID 19. If you develop any symptoms (ie: fever, flu-like symptoms, shortness of breath, cough etc.) before then, please call [phone].  If you test positive for Covid 19 in the 2 weeks post procedure, please call and report this information to us.    If any biopsies were taken you will be contacted by phone or by letter within the next 1-3 weeks.  Please call us at [phone] if you have not heard about the biopsies in 3 weeks.    SIGNATURES/CONFIDENTIALITY: You and/or your care partner have signed paperwork which will be entered into your electronic medical record.  These signatures attest to the fact that that the information above on your After Visit Summary has been reviewed and is understood.  Full responsibility of the confidentiality of this discharge information lies with you and/or your care-partner. 

## 2021-05-30 NOTE — Progress Notes (Signed)
Pt's states no medical or surgical changes since previsit or office visit.   Cw vitals and SM.

## 2021-05-30 NOTE — Progress Notes (Signed)
Sedate, gd SR, tolerated procedure well, VSS, report to RN 
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
## 2021-05-30 NOTE — Op Note (Signed)
[facility] Patient Name: Amy Glass Procedure Date: 05/30/2021 10:43 AM MRN: [id number] Endoscopist: Jerene Bears , MD Age: 60 Referring MD:  Date of Birth: 08-23-1960 Gender: Female Account #: [id number] Procedure:                Colonoscopy Indications:              High risk colon cancer surveillance: Personal                            history of colonic polyps (remote), Last                            colonoscopy: July 2017 Medicines:                Monitored Anesthesia Care Procedure:                Pre-Anesthesia Assessment:                           - Prior to the procedure, a History and Physical                            was performed, and patient medications and                            allergies were reviewed. The patient's tolerance of                            previous anesthesia was also reviewed. The risks                            and benefits of the procedure and the sedation                            options and risks were discussed with the patient.                            All questions were answered, and informed consent                            was obtained. Prior Anticoagulants: The patient has                            taken no previous anticoagulant or antiplatelet                            agents. ASA Grade Assessment: II - A patient with                            mild systemic disease. After reviewing the risks                            and benefits, the patient was deemed in  satisfactory condition to undergo the procedure.                           After obtaining informed consent, the colonoscope                            was passed under direct vision. Throughout the                            procedure, the patient's blood pressure, pulse, and                            oxygen saturations were monitored continuously. The                            Olympus PCF-H190DL (#[id number]) Colonoscope was                             introduced through the anus and advanced to the                            cecum, identified by appendiceal orifice and                            ileocecal valve. The colonoscopy was performed                            without difficulty. The patient tolerated the                            procedure well. The quality of the bowel                            preparation was good. The ileocecal valve,                            appendiceal orifice, and rectum were photographed. Scope In: 11:09:55 AM Scope Out: 11:27:36 AM Scope Withdrawal Time: 0 hours 13 minutes 53 seconds  Total Procedure Duration: 0 hours 17 minutes 41 seconds  Findings:                 The digital rectal exam was normal.                           A 4 mm polyp was found in the descending colon. The                            polyp was sessile. The polyp was removed with a                            cold snare. Resection and retrieval were complete.                           Two sessile polyps were found in the sigmoid colon.  The polyps were 4 to 5 mm in size. These polyps                            were removed with a cold snare. Resection and                            retrieval were complete.                           A 7 mm polyp was found in the rectum. The polyp was                            sessile. The polyp was removed with a cold snare.                            Resection and retrieval were complete.                           Multiple small and large-mouthed diverticula were                            found in the sigmoid colon, descending colon and                            ascending colon.                           The retroflexed view of the distal rectum and anal                            verge was normal and showed no anal or rectal                            abnormalities. Complications:            No immediate complications. Estimated Blood Loss:      Estimated blood loss was minimal. Impression:               - One 4 mm polyp in the descending colon, removed                            with a cold snare. Resected and retrieved.                           - Two 4 to 5 mm polyps in the sigmoid colon,                            removed with a cold snare. Resected and retrieved.                           - One 7 mm polyp in the rectum, removed with a cold  snare. Resected and retrieved.                           - Moderate diverticulosis in the sigmoid colon, in                            the descending colon and in the ascending colon.                           - The distal rectum and anal verge are normal on                            retroflexion view. Recommendation:           - Patient has a contact number available for                            emergencies. The signs and symptoms of potential                            delayed complications were discussed with the                            patient. Return to normal activities tomorrow.                            Written discharge instructions were provided to the                            patient.                           - Resume previous diet.                           - Continue present medications.                           - Await pathology results.                           - Repeat colonoscopy is recommended for                            surveillance. The colonoscopy date will be                            determined after pathology results from today's                            exam become available for review. Jerene Bears, MD 05/30/2021 11:30:53 AM This report has been signed electronically.

## 2021-06-01 ENCOUNTER — Telehealth: Payer: Self-pay | Admitting: *Deleted

## 2021-06-01 ENCOUNTER — Telehealth: Payer: Self-pay

## 2021-06-01 NOTE — Telephone Encounter (Signed)
  Follow up Call-  Call back number 05/30/2021  Post procedure Call Back phone  # [phone]  Permission to leave phone message Yes  Some recent data might be hidden     Patient questions:  Do you have a fever, pain , or abdominal swelling? No. Pain Score  0 *  Have you tolerated food without any problems? Yes.    Have you been able to return to your normal activities? Yes.    Do you have any questions about your discharge instructions: Diet   No. Medications  No. Follow up visit  No.  Do you have questions or concerns about your Care? No.  Actions: * If pain score is 4 or above: No action needed, pain <4.  Have you developed a fever since your procedure? no  2.   Have you had an respiratory symptoms (SOB or cough) since your procedure? no  3.   Have you tested positive for COVID 19 since your procedure no  4.   Have you had any family members/close contacts diagnosed with the COVID 19 since your procedure?  no   If yes to any of these questions please route to Joylene John, RN and Joella Prince, RN

## 2021-06-01 NOTE — Telephone Encounter (Signed)
Left message

## 2021-06-04 ENCOUNTER — Other Ambulatory Visit: Payer: Self-pay

## 2021-06-04 ENCOUNTER — Ambulatory Visit: Payer: Medicare HMO | Admitting: Internal Medicine

## 2021-06-04 ENCOUNTER — Ambulatory Visit: Payer: Medicare HMO | Attending: Internal Medicine | Admitting: Internal Medicine

## 2021-06-04 ENCOUNTER — Encounter: Payer: Self-pay | Admitting: Internal Medicine

## 2021-06-04 VITALS — BP 131/87 | HR 89 | Resp 16 | Ht 59.0 in | Wt 141.4 lb

## 2021-06-04 DIAGNOSIS — Z23 Encounter for immunization: Secondary | ICD-10-CM

## 2021-06-04 DIAGNOSIS — Z7189 Other specified counseling: Secondary | ICD-10-CM

## 2021-06-04 DIAGNOSIS — I10 Essential (primary) hypertension: Secondary | ICD-10-CM

## 2021-06-04 DIAGNOSIS — E663 Overweight: Secondary | ICD-10-CM

## 2021-06-04 DIAGNOSIS — Z0000 Encounter for general adult medical examination without abnormal findings: Secondary | ICD-10-CM | POA: Diagnosis not present

## 2021-06-04 NOTE — Patient Instructions (Addendum)
We have given you the packet today describes advanced directives.  If you decide to execute a living will or healthcare power of attorney, please bring [place] a copy for your record.  You will receive the flu shot today.  Goals for exercises to get in at least 30 minutes 5 days a week.  He may not be able to accomplish this initially but try to work up to it gradually over time.  Please take your amlodipine when you return home as your blood pressure is elevated.
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
## 2021-06-05 ENCOUNTER — Ambulatory Visit: Payer: Medicare HMO | Admitting: Physical Therapy

## 2021-06-05 DIAGNOSIS — R262 Difficulty in walking, not elsewhere classified: Secondary | ICD-10-CM

## 2021-06-05 DIAGNOSIS — M6281 Muscle weakness (generalized): Secondary | ICD-10-CM

## 2021-06-05 DIAGNOSIS — M25572 Pain in left ankle and joints of left foot: Secondary | ICD-10-CM

## 2021-06-05 DIAGNOSIS — R2689 Other abnormalities of gait and mobility: Secondary | ICD-10-CM | POA: Diagnosis not present

## 2021-06-05 NOTE — Therapy (Signed)
[place]. [place], [place], [address] Phone: [phone]   Fax:  [phone]  Physical Therapy Treatment  Patient Details  Name: Cambre Matson MRN: [id number] Date of Birth: 1960-12-07 Referring Provider (PT): Criselda Peaches, [place]   Encounter Date: 06/05/2021   PT End of Session - 06/05/21 1537     Visit Number 3    Number of Visits 9    Date for PT Re-Evaluation 07/03/21    PT Start Time 4193    PT Stop Time 7902    PT Time Calculation (min) 40 min    Activity Tolerance Patient tolerated treatment well    Behavior During Therapy [facility] for tasks assessed/performed             Past Medical History:  Diagnosis Date   ADHD (attention deficit hyperactivity disorder)    Allergy    Anxiety    as child    Depression    as child   Diabetes mellitus without complication ([place]) Dx 4097   Diabetes mellitus, type II ([place])    Diverticulosis    GERD (gastroesophageal reflux disease)    Hiatal hernia    Hyperlipidemia    Hyperplastic colon polyp    Hypertension Dx 2003   Schatzki's ring     Past Surgical History:  Procedure Laterality Date   BACK SURGERY  2002   lumbar   BREAST BIOPSY Left 05/11/2018   NON-CASEATING GRANULOMATOUS INFLAMMATION   BROW LIFT Bilateral 09/17/2016   Procedure: BLEPHAROPLASTY;  Surgeon: Irene Limbo, MD;  Location: [place];  Service: Plastics;  Laterality: Bilateral;   CARPAL TUNNEL RELEASE Right    CARPAL TUNNEL RELEASE Left 06/2016   CESAREAN SECTION  05/20/2003   COLONOSCOPY     ESOPHAGEAL MANOMETRY N/A 10/25/2015   Procedure: ESOPHAGEAL MANOMETRY (EM);  Surgeon: Jerene Bears, MD;  [facility];  Service: Gastroenterology;  Laterality: N/A;   POLYPECTOMY     PTOSIS REPAIR Bilateral 09/17/2016   Procedure: BILATERAL PTOSIS REPAIR EYELID WITH SUTURE TECHNIQUE, BILATERAL UPPER LID BLEPHAROPLASTY WITH EXCESS SKIN WEIGHING EYELID DOWN.;   Surgeon: Irene Limbo, MD;  Location: [place];  Service: Plastics;  Laterality: Bilateral;   SHOULDER SURGERY  08/2013   b/l shoulder    SHOULDER SURGERY Right 11/2015   UPPER GASTROINTESTINAL ENDOSCOPY      There were no vitals filed for this visit.   Subjective Assessment - 06/05/21 1537     Subjective Pt states that using the ankle brace while using the sandal it is too uncomfortable. Does not note any significant difference with use of ionto.    Limitations Standing;House hold activities;Walking    How long can you sit comfortably? no issues    How long can you stand comfortably? immediately with certain movements she can feel the pain    How long can you walk comfortably? with certain movements she can feel the pain; within 15 minutes she will start limping in flats    Patient Stated Goals Be able to wear flats in her house    Currently in Pain? Yes    Pain Score 6     Pain Location Ankle    Pain Orientation Left                               [facility] Adult PT Treatment/Exercise - 06/05/21 0001  Ambulation/Gait   Ambulation Distance (Feet) 30 Feet    Gait Pattern Step-through pattern;Abducted - left;Decreased dorsiflexion - left;Decreased weight shift to left    Ambulation Surface Level;Indoor    Gait Comments Worked in Radio broadcast assistant for pt to correct L foot abduction, improve toe off and heel strike      Knee/Hip Exercises: Aerobic   Nustep L4 x 5 min LEs      Knee/Hip Exercises: Standing   Heel Raises 2 sets;Both;10 reps    Heel Raises Limitations with ball squeeze      Manual Therapy   Manual therapy comments gentle STM to anterior tibialis at end of session      Ankle Exercises: Seated   Other Seated Ankle Exercises fitter inv/ev and df/pf x10 working on control    Other Seated Ankle Exercises DF/INV and EV 2 sets 10 yellow tband      Ankle Exercises: Standing   SLS On L standing on airex: tap forward on cone 2x10;  lateral tap to cone 2x10; forward and side tapping 2x10    Other Standing Ankle Exercises tandem stance on airex: 2x30 sec      Ankle Exercises: Stretches   Soleus Stretch 30 seconds    Other Stretch ant tibialis stretch x30 sec                       PT Short Term Goals - 05/22/21 1642       PT SHORT TERM GOAL #1   Title Pt will be IND with HEP    Status Achieved               PT Long Term Goals - 05/08/21 1818       PT LONG TERM GOAL #1   Title Pt will be independent with HEP    Time 8    Period Weeks    Status New    Target Date 07/03/21      PT LONG TERM GOAL #2   Title Pt will decrease pain by at least 50% with bare feet during walking and standing    Time 8    Period Weeks    Status New    Target Date 07/03/21      PT LONG TERM GOAL #3   Title Pt will have L = R ankle strength with no pain on MMT    Time 8    Period Weeks    Status New    Target Date 07/03/21      PT LONG TERM GOAL #4   Title Pt will be able to perform L SLS = R SLS to demo improved ankle stability    Time 8    Period Weeks    Status New    Target Date 07/03/21                   Plan - 06/05/21 1540     Clinical Impression Statement Treatment focused on continuing to work on ankle/foot strengthening. Initiated standing/balance exercises. Worked on pt's gait to reduce L foot abduction. Increased ant tib tightness at end of session -- provided gentle STM to address and stretch. Updated pt's HEP to theraband. Consider ice at end of next session as pt reports increased foot soreness.    Personal Factors and Comorbidities Age;Past/Current Experience;Time since onset of injury/illness/exacerbation    Examination-Activity Limitations Stand;Locomotion Level;Squat    Examination-Participation Restrictions Cleaning;Meal Prep;Shop;Community Activity    PT Treatment/Interventions ADLs/Self  Care Home Management;Aquatic Therapy;Electrical Stimulation;Ultrasound;DME  Instruction;Gait training;Stair training;Functional mobility training;Therapeutic activities;Therapeutic exercise;Balance training;Neuromuscular re-education;Manual techniques;Patient/family education;Orthotic Fit/Training;Dry needling;Passive range of motion;Taping    PT Next Visit Plan Progress ankle stabilization as able. Consider use of taping. Initiate hip/knee strengthening (consider addition of wall squats or steps).    PT Home Exercise Plan Access Code [email or web address]    Consulted and Agree with Plan of Care Patient             Patient will benefit from skilled therapeutic intervention in order to improve the following deficits and impairments:  Abnormal gait, Decreased range of motion, Decreased activity tolerance, Pain, Decreased balance, Decreased strength, Decreased mobility  Visit Diagnosis: Pain in left ankle and joints of left foot  Other abnormalities of gait and mobility  Muscle weakness (generalized)  Difficulty in walking, not elsewhere classified     Problem List Patient Active Problem List   Diagnosis Date Noted   Pain due to onychomycosis of toenails of both feet 02/20/2021   Diabetes mellitus without complication ([place]) 64/38/3818   Bipolar I disorder with depression ([place]) 02/01/2021   Difficulty concentrating 09/28/2019   Suicidal ideation 09/20/2019   Chest pain 05/28/2018   Dysphagia 05/28/2018   HTN (hypertension) 12/26/2016   Depression 02/08/2016   Rosacea 02/08/2016   Acanthosis nigricans 09/27/2015   TMJ pain dysfunction syndrome 01/16/2015   Poor dentition 01/16/2015   Lumbar degenerative disc disease 01/10/2015   Vitamin D deficiency 12/05/2014   Hypertriglyceridemia 12/05/2014   Diabetes type 2, controlled ([place]) 12/02/2014    Alesandra Smart April Gordy Levan, PT, DPT 06/05/2021, 4:29 PM  [place]. [place], [place], [address] Phone: [phone]   Fax:  [phone]  Name: Raeden Belzer MRN: [id number] Date of Birth: May 24, 1961

## 2021-06-06 ENCOUNTER — Encounter: Payer: Self-pay | Admitting: Internal Medicine

## 2021-06-06 ENCOUNTER — Other Ambulatory Visit: Payer: Self-pay | Admitting: Internal Medicine

## 2021-06-06 DIAGNOSIS — I10 Essential (primary) hypertension: Secondary | ICD-10-CM

## 2021-06-06 NOTE — Telephone Encounter (Signed)
Requested Prescriptions  Pending Prescriptions Disp Refills  . losartan (COZAAR) 100 MG tablet [Pharmacy Med Name: LOSARTAN POTASSIUM 100 MG Tablet] 90 tablet 0    Sig: TAKE 1 TABLET EVERY DAY     Cardiovascular:  Angiotensin Receptor Blockers Passed - 06/06/2021  3:25 AM      Passed - Cr in normal range and within 180 days    Creat  Date Value Ref Range Status  12/21/2015 0.56 0.50 - 1.05 mg/dL Final   Creatinine, Ser  Date Value Ref Range Status  02/26/2021 0.72 0.57 - 1.00 mg/dL Final         Passed - K in normal range and within 180 days    Potassium  Date Value Ref Range Status  02/26/2021 5.2 3.5 - 5.2 mmol/L Final         Passed - Patient is not pregnant      Passed - Last BP in normal range    BP Readings from Last 1 Encounters:  06/04/21 131/87         Passed - Valid encounter within last 6 months    Recent Outpatient Visits          2 days ago Encounter for [employer] annual wellness exam   [place] [name], MD   2 months ago Laceration of left thumb without foreign body with damage to nail, initial encounter   [place], [name], MD   3 months ago Right lower quadrant abdominal pain   [place], MD   4 months ago Chest pain in adult   [place], MD   4 months ago Acute right-sided thoracic back pain   [place], [name], MD      Future Appointments            In 2 months Wynetta Emery Dalbert Batman, MD [place]   In 2 months [place], Nadean Corwin, MD [place], [facility]   In 5 months [place], Rae Halsted, MD Allergy and [place]

## 2021-06-12 ENCOUNTER — Ambulatory Visit: Payer: Medicare HMO | Admitting: Physical Therapy

## 2021-06-12 ENCOUNTER — Other Ambulatory Visit: Payer: Self-pay

## 2021-06-12 DIAGNOSIS — R262 Difficulty in walking, not elsewhere classified: Secondary | ICD-10-CM | POA: Diagnosis not present

## 2021-06-12 DIAGNOSIS — M25572 Pain in left ankle and joints of left foot: Secondary | ICD-10-CM

## 2021-06-12 DIAGNOSIS — R2689 Other abnormalities of gait and mobility: Secondary | ICD-10-CM | POA: Diagnosis not present

## 2021-06-12 DIAGNOSIS — M6281 Muscle weakness (generalized): Secondary | ICD-10-CM

## 2021-06-12 NOTE — Therapy (Signed)
[place] [place], [place], [address] Phone: [phone]   Fax:  [phone]  Physical Therapy Treatment  Patient Details  Name: Chanelle Hodsdon MRN: [id number] Date of Birth: Jun 27, 1961 Referring Provider (PT): Criselda Peaches, [place]   Encounter Date: 06/12/2021   PT End of Session - 06/12/21 1528     Visit Number 4    Number of Visits 9    Date for PT Re-Evaluation 07/03/21    Authorization Type [employer]    PT Start Time 1528    PT Stop Time 7628    PT Time Calculation (min) 42 min    Activity Tolerance Patient tolerated treatment well    Behavior During Therapy [name] for tasks assessed/performed             Past Medical History:  Diagnosis Date   ADHD (attention deficit hyperactivity disorder)    Allergy    Anxiety    as child    Depression    as child   Diabetes mellitus without complication ([place]) Dx 3151   Diabetes mellitus, type II ([place])    Diverticulosis    GERD (gastroesophageal reflux disease)    Hiatal hernia    Hyperlipidemia    Hyperplastic colon polyp    Hypertension Dx 2003   Schatzki's ring     Past Surgical History:  Procedure Laterality Date   BACK SURGERY  2002   lumbar   BREAST BIOPSY Left 05/11/2018   NON-CASEATING GRANULOMATOUS INFLAMMATION   BROW LIFT Bilateral 09/17/2016   Procedure: BLEPHAROPLASTY;  Surgeon: Irene Limbo, MD;  Location: [place];  Service: Plastics;  Laterality: Bilateral;   CARPAL TUNNEL RELEASE Right    CARPAL TUNNEL RELEASE Left 06/2016   CESAREAN SECTION  05/20/2003   COLONOSCOPY     ESOPHAGEAL MANOMETRY N/A 10/25/2015   Procedure: ESOPHAGEAL MANOMETRY (EM);  Surgeon: Jerene Bears, MD;  [facility];  Service: Gastroenterology;  Laterality: N/A;   POLYPECTOMY     PTOSIS REPAIR Bilateral 09/17/2016   Procedure: BILATERAL PTOSIS REPAIR EYELID WITH SUTURE TECHNIQUE, BILATERAL UPPER LID BLEPHAROPLASTY  WITH EXCESS SKIN WEIGHING EYELID DOWN.;  Surgeon: Irene Limbo, MD;  Location: [place];  Service: Plastics;  Laterality: Bilateral;   SHOULDER SURGERY  08/2013   b/l shoulder    SHOULDER SURGERY Right 11/2015   UPPER GASTROINTESTINAL ENDOSCOPY      There were no vitals filed for this visit.   Subjective Assessment - 06/12/21 1530     Subjective Pt states that her feet have been bothering her less because she's not in her flip flops as much. Exercises make it sore.    Limitations Standing;House hold activities;Walking    How long can you sit comfortably? no issues    How long can you stand comfortably? immediately with certain movements she can feel the pain    How long can you walk comfortably? with certain movements she can feel the pain; within 15 minutes she will start limping in flats    Patient Stated Goals Be able to wear flats in her house    Currently in Pain? Yes    Pain Score 5    "only when I move it a certain way"                              [facility] Adult PT Treatment/Exercise - 06/12/21 0001  Ambulation/Gait   Ambulation Distance (Feet) --   3 laps around gym   Gait Comments cues to decrease L foot abduction; able to tolerate ~36 sec before feeling in distal/posterior lateral malleolus      Manual Therapy   Joint Mobilization subtalar grade II mobilization for inv/ev    Soft tissue mobilization TPR and STW ant tib, peroneal brevis & longus; discussed IASTM with massage roller/rolling pin at home      Ankle Exercises: Seated   Other Seated Ankle Exercises DF/INV and EV 2 sets 10 red tband      Ankle Exercises: Standing   SLS 2x20 sec cues for maintaining arch    Heel Raises Both;20 reps   with ball squeeze   Other Standing Ankle Exercises side stepping with 20# weight x3 sets each side      Ankle Exercises: Stretches   Soleus Stretch 30 seconds    Gastroc Stretch 30 seconds    Other Stretch ant tibialis stretch x30  sec      Ankle Exercises: Supine   Other Supine Ankle Exercises Eccentric inv & ev x10 each with green tband                       PT Short Term Goals - 05/22/21 1642       PT SHORT TERM GOAL #1   Title Pt will be IND with HEP    Status Achieved               PT Long Term Goals - 05/08/21 1818       PT LONG TERM GOAL #1   Title Pt will be independent with HEP    Time 8    Period Weeks    Status New    Target Date 07/03/21      PT LONG TERM GOAL #2   Title Pt will decrease pain by at least 50% with bare feet during walking and standing    Time 8    Period Weeks    Status New    Target Date 07/03/21      PT LONG TERM GOAL #3   Title Pt will have L = R ankle strength with no pain on MMT    Time 8    Period Weeks    Status New    Target Date 07/03/21      PT LONG TERM GOAL #4   Title Pt will be able to perform L SLS = R SLS to demo improved ankle stability    Time 8    Period Weeks    Status New    Target Date 07/03/21                   Plan - 06/12/21 1619     Clinical Impression Statement Continued to work on ankle/foot strengthening and stabilization. Manual therapy to address taut ant tib and peroneals. Pt reports soreness at end of session but no pain. Consider taping next session and then attempt amb barefeet.    Personal Factors and Comorbidities Age;Past/Current Experience;Time since onset of injury/illness/exacerbation    Examination-Activity Limitations Stand;Locomotion Level;Squat    Examination-Participation Restrictions Cleaning;Meal Prep;Shop;Community Activity    PT Treatment/Interventions ADLs/Self Care Home Management;Aquatic Therapy;Electrical Stimulation;Ultrasound;DME Instruction;Gait training;Stair training;Functional mobility training;Therapeutic activities;Therapeutic exercise;Balance training;Neuromuscular re-education;Manual techniques;Patient/family education;Orthotic Fit/Training;Dry needling;Passive range of  motion;Taping    PT Next Visit Plan Progress ankle stabilization with eccentrics. Manual therapy as needed for ant tib/peroneals. Trial ktape on  peroneals prior to walking barefoot to see if it improves time to pain. Initiate hip/knee strengthening (consider addition of wall squats or steps).    PT Home Exercise Plan Access Code [email or web address] (eccentrics), 5 min walking barefoot with proper gait pattern, self myofacial release/massage of peroneals and ant tib    Consulted and Agree with Plan of Care Patient             Patient will benefit from skilled therapeutic intervention in order to improve the following deficits and impairments:  Abnormal gait, Decreased range of motion, Decreased activity tolerance, Pain, Decreased balance, Decreased strength, Decreased mobility  Visit Diagnosis: Pain in left ankle and joints of left foot  Other abnormalities of gait and mobility  Muscle weakness (generalized)  Difficulty in walking, not elsewhere classified     Problem List Patient Active Problem List   Diagnosis Date Noted   Pain due to onychomycosis of toenails of both feet 02/20/2021   Diabetes mellitus without complication ([place]) 62/22/9798   Bipolar I disorder with depression ([place]) 02/01/2021   Difficulty concentrating 09/28/2019   Suicidal ideation 09/20/2019   Chest pain 05/28/2018   Dysphagia 05/28/2018   HTN (hypertension) 12/26/2016   Depression 02/08/2016   Rosacea 02/08/2016   Acanthosis nigricans 09/27/2015   TMJ pain dysfunction syndrome 01/16/2015   Poor dentition 01/16/2015   Lumbar degenerative disc disease 01/10/2015   Vitamin D deficiency 12/05/2014   Hypertriglyceridemia 12/05/2014   Diabetes type 2, controlled ([place]) 12/02/2014    Veronica Guerrant April Gordy Levan, PT, DPT 06/12/2021, 4:25 PM  [place]. [place], [place], [address] Phone: [phone]   Fax:  [phone]  Name: Viera Okonski MRN: [id number] Date of Birth: [birth year]

## 2021-06-19 ENCOUNTER — Ambulatory Visit: Payer: Medicare HMO | Admitting: Physical Therapy

## 2021-06-19 ENCOUNTER — Telehealth: Payer: Self-pay | Admitting: Internal Medicine

## 2021-06-19 MED ORDER — OMEPRAZOLE 40 MG PO CPDR: 40.0000 mg | DELAYED_RELEASE_CAPSULE | Freq: Every day | ORAL | 4 refills | Status: DC

## 2021-06-19 NOTE — Telephone Encounter (Signed)
It looks like patient had previously been on Omeprazole and Famotidine. Both were discontinued last year ago. Would you like her to restart?

## 2021-06-19 NOTE — Telephone Encounter (Signed)
Patient called stating she was having issues with reflux and wanted to know if she could be put back on the medication she previously took for it.  She has an appointment 08/30/21 at 9:30 a.m.  If able to prescribe, please call patient and let her know.  Thank you.

## 2021-06-19 NOTE — Telephone Encounter (Signed)
Omeprazole 40 mg once daily has been sent to patient's pharmacy. Patient has been contacted and advised that Dr. Hilarie Fredrickson has advised for her to restart Omeprazole 40 mg and be sure to keep her appointment scheduled for 08/30/21.

## 2021-06-19 NOTE — Telephone Encounter (Signed)
Start by resuming omeprazole 40 mg once daily She should keep her follow-up appointment

## 2021-06-19 NOTE — Telephone Encounter (Signed)
Awaiting notification from pharmacy.

## 2021-06-19 NOTE — Telephone Encounter (Signed)
Patient returned call states Omeprazole need prior authorization

## 2021-06-20 DIAGNOSIS — F902 Attention-deficit hyperactivity disorder, combined type: Secondary | ICD-10-CM | POA: Diagnosis not present

## 2021-06-20 DIAGNOSIS — F900 Attention-deficit hyperactivity disorder, predominantly inattentive type: Secondary | ICD-10-CM | POA: Diagnosis not present

## 2021-06-20 DIAGNOSIS — F331 Major depressive disorder, recurrent, moderate: Secondary | ICD-10-CM | POA: Diagnosis not present

## 2021-06-20 DIAGNOSIS — G4700 Insomnia, unspecified: Secondary | ICD-10-CM | POA: Diagnosis not present

## 2021-06-20 DIAGNOSIS — F411 Generalized anxiety disorder: Secondary | ICD-10-CM | POA: Diagnosis not present

## 2021-06-21 NOTE — Telephone Encounter (Signed)
Inbound call from patient asking if she can take omeprazole twice a day instead of once daily because acid reflux is getting worse.  Please advise.

## 2021-06-21 NOTE — Telephone Encounter (Signed)
Patient advised she may use Omeprazole twice daily for for weeks, and then decrease back to daily if she gets better. She is aware to make sure she keeps her appointment.

## 2021-06-21 NOTE — Telephone Encounter (Signed)
Yes, she can use BID for 4 weeks and then if better decrease to once daily Keep followup appt as scheduled

## 2021-06-27 ENCOUNTER — Encounter: Payer: Self-pay | Admitting: Internal Medicine

## 2021-06-27 DIAGNOSIS — E119 Type 2 diabetes mellitus without complications: Secondary | ICD-10-CM | POA: Diagnosis not present

## 2021-06-27 LAB — HM DIABETES EYE EXAM

## 2021-06-28 DIAGNOSIS — Z0100 Encounter for examination of eyes and vision without abnormal findings: Secondary | ICD-10-CM | POA: Diagnosis not present

## 2021-06-28 DIAGNOSIS — H524 Presbyopia: Secondary | ICD-10-CM | POA: Diagnosis not present

## 2021-06-29 DIAGNOSIS — F411 Generalized anxiety disorder: Secondary | ICD-10-CM | POA: Diagnosis not present

## 2021-06-29 DIAGNOSIS — F900 Attention-deficit hyperactivity disorder, predominantly inattentive type: Secondary | ICD-10-CM | POA: Diagnosis not present

## 2021-06-29 DIAGNOSIS — F331 Major depressive disorder, recurrent, moderate: Secondary | ICD-10-CM | POA: Diagnosis not present

## 2021-07-02 ENCOUNTER — Other Ambulatory Visit: Payer: Self-pay | Admitting: *Deleted

## 2021-07-02 MED ORDER — OMEPRAZOLE 40 MG PO CPDR: 40.0000 mg | DELAYED_RELEASE_CAPSULE | Freq: Every day | ORAL | 0 refills | Status: DC

## 2021-07-18 DIAGNOSIS — G4700 Insomnia, unspecified: Secondary | ICD-10-CM | POA: Diagnosis not present

## 2021-07-18 DIAGNOSIS — Z79899 Other long term (current) drug therapy: Secondary | ICD-10-CM | POA: Diagnosis not present

## 2021-07-18 DIAGNOSIS — F411 Generalized anxiety disorder: Secondary | ICD-10-CM | POA: Diagnosis not present

## 2021-07-18 DIAGNOSIS — F331 Major depressive disorder, recurrent, moderate: Secondary | ICD-10-CM | POA: Diagnosis not present

## 2021-07-18 DIAGNOSIS — F902 Attention-deficit hyperactivity disorder, combined type: Secondary | ICD-10-CM | POA: Diagnosis not present

## 2021-07-24 DIAGNOSIS — M5416 Radiculopathy, lumbar region: Secondary | ICD-10-CM | POA: Diagnosis not present

## 2021-07-24 DIAGNOSIS — Z6827 Body mass index (BMI) 27.0-27.9, adult: Secondary | ICD-10-CM | POA: Diagnosis not present

## 2021-07-24 DIAGNOSIS — G629 Polyneuropathy, unspecified: Secondary | ICD-10-CM | POA: Diagnosis not present

## 2021-07-24 DIAGNOSIS — M542 Cervicalgia: Secondary | ICD-10-CM | POA: Diagnosis not present

## 2021-07-24 DIAGNOSIS — M546 Pain in thoracic spine: Secondary | ICD-10-CM | POA: Diagnosis not present

## 2021-07-25 DIAGNOSIS — Z79899 Other long term (current) drug therapy: Secondary | ICD-10-CM | POA: Diagnosis not present

## 2021-07-26 DIAGNOSIS — F900 Attention-deficit hyperactivity disorder, predominantly inattentive type: Secondary | ICD-10-CM | POA: Diagnosis not present

## 2021-07-26 DIAGNOSIS — F411 Generalized anxiety disorder: Secondary | ICD-10-CM | POA: Diagnosis not present

## 2021-07-26 DIAGNOSIS — F331 Major depressive disorder, recurrent, moderate: Secondary | ICD-10-CM | POA: Diagnosis not present

## 2021-07-28 ENCOUNTER — Other Ambulatory Visit: Payer: Self-pay | Admitting: Internal Medicine

## 2021-07-28 DIAGNOSIS — E1159 Type 2 diabetes mellitus with other circulatory complications: Secondary | ICD-10-CM

## 2021-07-28 NOTE — Telephone Encounter (Signed)
Requested Prescriptions  Pending Prescriptions Disp Refills   amLODipine (NORVASC) 10 MG tablet [Pharmacy Med Name: AMLODIPINE BESYLATE 10 MG TAB] 90 tablet 1    Sig: TAKE 1 TABLET BY MOUTH EVERY DAY     Cardiovascular:  Calcium Channel Blockers Passed - 07/28/2021  9:38 AM      Passed - Last BP in normal range    BP Readings from Last 1 Encounters:  06/04/21 131/87         Passed - Valid encounter within last 6 months    Recent Outpatient Visits          1 month ago Encounter for [employer] annual wellness exam   [place], Neoma Laming B, MD   4 months ago Laceration of left thumb without foreign body with damage to nail, initial encounter   [place], [name], MD   5 months ago Right lower quadrant abdominal pain   [place], MD   5 months ago Chest pain in adult   [place], MD   6 months ago Acute right-sided thoracic back pain   [place], MD      Future Appointments            In 1 week [name], MD [place]   In 1 week [place], Nadean Corwin, MD [place], [facility]   In 3 months [place], Rae Halsted, MD [facility] [place]

## 2021-07-31 ENCOUNTER — Other Ambulatory Visit: Payer: Self-pay | Admitting: Oral Surgery

## 2021-07-31 DIAGNOSIS — F331 Major depressive disorder, recurrent, moderate: Secondary | ICD-10-CM | POA: Diagnosis not present

## 2021-07-31 DIAGNOSIS — F900 Attention-deficit hyperactivity disorder, predominantly inattentive type: Secondary | ICD-10-CM | POA: Diagnosis not present

## 2021-07-31 DIAGNOSIS — M26631 Articular disc disorder of right temporomandibular joint: Secondary | ICD-10-CM

## 2021-07-31 DIAGNOSIS — F411 Generalized anxiety disorder: Secondary | ICD-10-CM | POA: Diagnosis not present

## 2021-08-06 ENCOUNTER — Other Ambulatory Visit: Payer: Self-pay | Admitting: Allergy

## 2021-08-06 NOTE — Telephone Encounter (Signed)
Would you like for [place] to send in Azelastine 0.1% ?

## 2021-08-07 ENCOUNTER — Other Ambulatory Visit: Payer: Self-pay | Admitting: *Deleted

## 2021-08-07 ENCOUNTER — Encounter: Payer: Self-pay | Admitting: Internal Medicine

## 2021-08-07 ENCOUNTER — Other Ambulatory Visit: Payer: Self-pay

## 2021-08-07 ENCOUNTER — Ambulatory Visit: Payer: Medicare HMO | Attending: Internal Medicine | Admitting: Internal Medicine

## 2021-08-07 VITALS — BP 114/78 | HR 94 | Resp 16 | Wt 139.8 lb

## 2021-08-07 DIAGNOSIS — L304 Erythema intertrigo: Secondary | ICD-10-CM

## 2021-08-07 DIAGNOSIS — E781 Pure hyperglyceridemia: Secondary | ICD-10-CM | POA: Diagnosis not present

## 2021-08-07 DIAGNOSIS — F319 Bipolar disorder, unspecified: Secondary | ICD-10-CM

## 2021-08-07 DIAGNOSIS — E118 Type 2 diabetes mellitus with unspecified complications: Secondary | ICD-10-CM | POA: Diagnosis not present

## 2021-08-07 DIAGNOSIS — I10 Essential (primary) hypertension: Secondary | ICD-10-CM | POA: Diagnosis not present

## 2021-08-07 LAB — POCT GLYCOSYLATED HEMOGLOBIN (HGB A1C): HbA1c, POC (controlled diabetic range): 6.9 % (ref 0.0–7.0)

## 2021-08-07 LAB — GLUCOSE, POCT (MANUAL RESULT ENTRY): POC Glucose: 122 mg/dl — AB (ref 70–99)

## 2021-08-07 MED ORDER — NYSTATIN 100000 UNIT/GM EX CREA: 1.0000 "application " | TOPICAL_CREAM | Freq: Two times a day (BID) | CUTANEOUS | 2 refills | Status: DC

## 2021-08-07 MED ORDER — AZELASTINE HCL 0.1 % NA SOLN: 2.0000 | Freq: Two times a day (BID) | NASAL | 5 refills | Status: DC | PRN
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
## 2021-08-09 ENCOUNTER — Encounter: Payer: Self-pay | Admitting: Internal Medicine

## 2021-08-09 ENCOUNTER — Ambulatory Visit (INDEPENDENT_AMBULATORY_CARE_PROVIDER_SITE_OTHER): Payer: Medicare HMO | Admitting: Internal Medicine

## 2021-08-09 ENCOUNTER — Other Ambulatory Visit: Payer: Self-pay

## 2021-08-09 VITALS — BP 105/69 | HR 75 | Ht 59.0 in | Wt 142.2 lb

## 2021-08-09 DIAGNOSIS — E781 Pure hyperglyceridemia: Secondary | ICD-10-CM | POA: Diagnosis not present

## 2021-08-09 DIAGNOSIS — I10 Essential (primary) hypertension: Secondary | ICD-10-CM

## 2021-08-09 DIAGNOSIS — E119 Type 2 diabetes mellitus without complications: Secondary | ICD-10-CM

## 2021-08-09 NOTE — Patient Instructions (Signed)
Medication Instructions:  NO CHANGES today -- will depend on lab results   *If you need a refill on your cardiac medications before your next appointment, please call your pharmacy*   Lab Work: FASTING lab work to check cholesterol   If you have labs (blood work) drawn today and your tests are completely normal, you will receive your results only by: [place] (if you have MyChart) OR A paper copy in the mail If you have any lab test that is abnormal or we need to change your treatment, we will call you to review the results.   Testing/Procedures: NONE   Follow-Up: At [facility], you and your health needs are our priority.  As part of our continuing mission to provide you with exceptional heart care, we have created designated Provider Care Teams.  These Care Teams include your primary Cardiologist (physician) and Advanced Practice Providers (APPs -  Physician Assistants and Nurse Practitioners) who all work together to provide you with the care you need, when you need it.  We recommend signing up for the patient portal called "MyChart".  Sign up information is provided on this After Visit Summary.  MyChart is used to connect with patients for Virtual Visits (Telemedicine).  Patients are able to view lab/test results, encounter notes, upcoming appointments, etc.  Non-urgent messages can be sent to your provider as well.   To learn more about what you can do with MyChart, go to [email or web address].    Your next appointment:   6 months with Dr. Debara Pickett

## 2021-08-09 NOTE — Progress Notes (Signed)
LIPID CLINIC CONSULT NOTE  Chief Complaint:  Follow-up CTA, hypertriglyceridemia  Primary Care Physician: [name], MD  HPI:  Amy Glass is a 61 y.o. female who is being seen today for the evaluation of high triglycerides at the request of [name], MD.  This is a pleasant 61 year old female whose family is from [place], with a history of high triglycerides for many years, fatty liver disease and elevated liver enzymes.  In addition she has diabetes and hypertension.  She is referred today for evaluation of marked dyslipidemia and hypertriglyceridemia.  In discussing with her recently she has had chest pain as well.  She says she gets pain across her chest in the last few days it is been significantly worse.  It has been going on for several months and is little worse with exertion and relieved by rest.  She has been doing some more activity including exercise at a gym but finds has been a little more difficult to do that.  She also notes trouble swallowing.  She says at times pills get stuck in her throat.  Recently she was started on gemfibrozil since she had a history of stomach upset on fenofibrate.  She says she has difficulty swallowing the gemfibrozil tablets based on their size.  She does have a history of esophageal dilatation in the past, but is not actively seeing a gastroenterologist.  She denies alcohol use.  She reports fairly low-fat diet.  Over the past year however her diet was not as good as it had previously been since she has been going through a divorce.  She recently though has eaten more healthy, worked on weight loss and lost more than 15 pounds.  04/23/2021  Amy Glass returns today for follow-up of cardiac CTA.  She was seen by Laurann Montana, NP on August 4 for me at droppage at the time for chest pain.  He had been describing chest pain that radiates to her neck.  She has been having that more with exertion but it tends to relieved  with rest.  She is able to do water aerobics however without that discomfort.  She also has some issues swallowing pills.  This sounds like a pill esophagitis.  She underwent CT coronary angiography on August 15 which showed a calcium score 53.6, 91st percentile for age and sex matched controls.  Despite the extensive coronary calcium for her age, she did only have minimal to mild nonobstructive coronary disease.  Small right-sided pulmonary nodules were noted with possible 1 year CT follow-up recommended.  Most recent labs in July do show persistently elevated triglycerides of 610, total cholesterol 171, HDL 27 and LDL 54.  She had been started on Vascepa, however she tells me she is only taking 1 g twice a day.  08/09/2021  Amy Glass returns today for follow-up of her hypertriglyceridemia.  She reports compliance with her medications.  She has made significant dietary changes although has not lost significant weight.  Her hemoglobin A1c is come down remarkably but gone up a little up now to 6.9%.  I suspect that her triglycerides have improved however she did not have fasting blood work as we had requested prior to this visit.  She is not fasting today.  We will need to obtain that to better advise her on any medicine changes.  PMHx:  Past Medical History:  Diagnosis Date   ADHD (attention deficit hyperactivity disorder)    Allergy    Anxiety  as child    Depression    as child   Diabetes mellitus without complication ([place]) Dx 1856   Diabetes mellitus, type II ([place])    Diverticulosis    GERD (gastroesophageal reflux disease)    Hiatal hernia    Hyperlipidemia    Hyperplastic colon polyp    Hypertension Dx 2003   Schatzki's ring     Past Surgical History:  Procedure Laterality Date   BACK SURGERY  2002   lumbar   BREAST BIOPSY Left 05/11/2018   NON-CASEATING GRANULOMATOUS INFLAMMATION   BROW LIFT Bilateral 09/17/2016   Procedure: BLEPHAROPLASTY;  Surgeon: Irene Limbo, MD;   Location: [facility];  Service: Plastics;  Laterality: Bilateral;   CARPAL TUNNEL RELEASE Right    CARPAL TUNNEL RELEASE Left 06/2016   CESAREAN SECTION  05/20/2003   COLONOSCOPY     ESOPHAGEAL MANOMETRY N/A 10/25/2015   Procedure: ESOPHAGEAL MANOMETRY (EM);  Surgeon: Jerene Bears, MD;  Location: [facility];  Service: Gastroenterology;  Laterality: N/A;   POLYPECTOMY     PTOSIS REPAIR Bilateral 09/17/2016   Procedure: BILATERAL PTOSIS REPAIR EYELID WITH SUTURE TECHNIQUE, BILATERAL UPPER LID BLEPHAROPLASTY WITH EXCESS SKIN WEIGHING EYELID DOWN.;  Surgeon: Irene Limbo, MD;  Location: [facility];  Service: Plastics;  Laterality: Bilateral;   SHOULDER SURGERY  08/2013   b/l shoulder    SHOULDER SURGERY Right 11/2015   UPPER GASTROINTESTINAL ENDOSCOPY      FAMHx:  Family History  Problem Relation Age of Onset   Diabetes Mother    Heart disease Mother    Hyperlipidemia Mother    Renal cancer Maternal Aunt    Stomach cancer Maternal Uncle    Diabetes Maternal Grandmother    Hyperlipidemia Maternal Grandmother    Healthy Daughter    Healthy Daughter    Healthy Daughter    Colon cancer Neg Hx    Esophageal cancer Neg Hx    Rectal cancer Neg Hx    Breast cancer Neg Hx    Colon polyps Neg Hx     SOCHx:   reports that she has never smoked. She has never used smokeless tobacco. She reports that she does not drink alcohol and does not use drugs.  ALLERGIES:  Allergies  Allergen Reactions   Nitroglycerin Other (See Comments)    Migraines, (only tried nitro patch. Has never tried the pills)   Tricor [Fenofibrate]     Stomach upset    ROS: Pertinent items noted in HPI and remainder of comprehensive ROS otherwise negative.  HOME MEDS: Current Outpatient Medications on File Prior to Visit  Medication Sig Dispense Refill   Accu-Chek Softclix Lancets lancets Use as directed to test blood sugar once daily 100 each 2   amLODipine (NORVASC) 10 MG  tablet TAKE 1 TABLET BY MOUTH EVERY DAY 90 tablet 1   amphetamine-dextroamphetamine (ADDERALL) 20 MG tablet      ARIPiprazole (ABILIFY) 2 MG tablet      aspirin EC 81 MG tablet Take 81 mg by mouth daily. Swallow whole.     atorvastatin (LIPITOR) 20 MG tablet TAKE 1 TABLET BY MOUTH EVERY DAY 90 tablet 1   Blood Glucose Monitoring Suppl (ACCU-CHEK GUIDE) w/Device KIT 1 each by Does not apply route daily. Use as directed to test blood sugar once daily 1 kit 0   desvenlafaxine (PRISTIQ) 50 MG 24 hr tablet      fluticasone (FLONASE) 50 MCG/ACT nasal spray Apply 2 sprays each nostril daily for 1-2 weeks  at a time before stopping once nasal congestion improves for maximum benefit 16 g 5   glucose blood (ACCU-CHEK GUIDE) test strip Use as directed to test blood sugar once daily 100 each 2   icosapent Ethyl (VASCEPA) 1 g capsule Take 2 capsules (2 g total) by mouth 2 (two) times daily. 360 capsule 2   JANUMET 50-1000 MG tablet TAKE 1 TABLET EVERY DAY 90 tablet 0   losartan (COZAAR) 100 MG tablet TAKE 1 TABLET EVERY DAY 90 tablet 1   nystatin cream (MYCOSTATIN) Apply 1 application topically 2 (two) times daily. 30 g 2   Olopatadine HCl (PATADAY) 0.2 % SOLN Place 1 drop into both eyes 1 day or 1 dose. 2.5 mL 5   omeprazole (PRILOSEC) 40 MG capsule Take 1 capsule (40 mg total) by mouth daily. 90 capsule 0   traMADol-acetaminophen (ULTRACET) 37.5-325 MG tablet      traZODone (DESYREL) 100 MG tablet Take 1 tablet (100 mg total) by mouth at bedtime as needed for sleep. 90 tablet 2   No current facility-administered medications on file prior to visit.    LABS/IMAGING: Results for orders placed or performed in visit on 08/07/21 (from the past 48 hour(s))  POCT glycosylated hemoglobin (Hb A1C)     Status: Abnormal   Collection Time: 08/07/21  4:06 PM  Result Value Ref Range   Hemoglobin A1C     HbA1c POC (<> result, manual entry)     HbA1c, POC (prediabetic range)     HbA1c, POC (controlled diabetic  range) 6.9 0.0 - 7.0 %  POCT glucose (manual entry)     Status: Abnormal   Collection Time: 08/07/21  4:08 PM  Result Value Ref Range   POC Glucose 122 (A) 70 - 99 mg/dl   No results found.  LIPID PANEL:    Component Value Date/Time   CHOL 171 01/24/2021 0957   TRIG 610 (HH) 01/24/2021 0957   HDL 27 (L) 01/24/2021 0957   CHOLHDL 6.3 (H) 01/24/2021 0957   CHOLHDL 6.9 09/21/2019 0715   VLDL UNABLE TO CALCULATE IF TRIGLYCERIDE OVER 400 mg/dL 09/21/2019 0715   LDLCALC 54 01/24/2021 0957   LDLDIRECT 89.0 09/21/2019 0715    WEIGHTS: Wt Readings from Last 3 Encounters:  08/09/21 142 lb 3.2 oz (64.5 kg)  08/07/21 139 lb 12.8 oz (63.4 kg)  06/04/21 141 lb 6.4 oz (64.1 kg)    VITALS: BP 105/69    Pulse 75    Ht '4\' 11"'  (1.499 m)    Wt 142 lb 3.2 oz (64.5 kg)    LMP 05/30/2015    SpO2 98%    BMI 28.72 kg/m   EXAM: Deferred  EKG: N/A  ASSESSMENT: Primary hypertriglyceridemia Chest pain, nonobstructive coronary disease by CTA (02/2021)-CAC score 53.6, 91st percentile Type 2 diabetes Hypertension  PLAN: 1.   Amy Glass unfortunately did not get her blood work prior to the visit today.  We will go ahead and order that including fasting lipid and direct LDL.  Once I see her values I can recommend any changes if necessary.  She denies any chest pain.  She reports recent improvement in her A1c which was significantly better however recently turned up a little bit to 6.9%.  Blood pressure appears well controlled.  Follow-up with me in about 6 months  Pixie Casino, MD, [facility], [place] Director of the Advanced Lipid Disorders &  Cardiovascular Risk Reduction Clinic Diplomate of the  American Board of Clinical Lipidology Attending Cardiologist  Direct Dial: [phone]   Fax: [phone]  Website:  [email or web address] 08/09/2021, 3:58 PM

## 2021-08-11 ENCOUNTER — Other Ambulatory Visit: Payer: Self-pay | Admitting: Internal Medicine

## 2021-08-12 NOTE — Telephone Encounter (Signed)
dc'd 02/01/21 reorder dose changed Dr. Wynetta Emery  Requested Prescriptions  Refused Prescriptions Disp Refills   amLODipine ([place]) 5 MG tablet [Pharmacy Med Name: AMLODIPINE BESYLATE 5 MG Tablet] 90 tablet     Sig: TAKE 1 TABLET EVERY DAY     Cardiovascular:  Calcium Channel Blockers Passed - 08/11/2021 10:00 PM      Passed - Last BP in normal range    BP Readings from Last 1 Encounters:  08/09/21 105/69         Passed - Valid encounter within last 6 months    Recent Outpatient Visits          5 days ago Controlled type 2 diabetes mellitus with complication, without long-term current use of insulin ([place])   [place] [name], MD   2 months ago Encounter for [employer] annual wellness exam   [place] [name], MD   4 months ago Laceration of left thumb without foreign body with damage to nail, initial encounter   [place], [name], MD   5 months ago Right lower quadrant abdominal pain   [place], MD   6 months ago Chest pain in adult   [place], MD      Future Appointments            In 2 months Padgett, Rae Halsted, MD [facility] and [place]   In 3 months [name], MD [place]

## 2021-08-21 DIAGNOSIS — F411 Generalized anxiety disorder: Secondary | ICD-10-CM | POA: Diagnosis not present

## 2021-08-21 DIAGNOSIS — F331 Major depressive disorder, recurrent, moderate: Secondary | ICD-10-CM | POA: Diagnosis not present

## 2021-08-21 DIAGNOSIS — F900 Attention-deficit hyperactivity disorder, predominantly inattentive type: Secondary | ICD-10-CM | POA: Diagnosis not present

## 2021-08-24 DIAGNOSIS — R030 Elevated blood-pressure reading, without diagnosis of hypertension: Secondary | ICD-10-CM | POA: Diagnosis not present

## 2021-08-24 DIAGNOSIS — Z79899 Other long term (current) drug therapy: Secondary | ICD-10-CM | POA: Diagnosis not present

## 2021-08-24 DIAGNOSIS — M542 Cervicalgia: Secondary | ICD-10-CM | POA: Diagnosis not present

## 2021-08-24 DIAGNOSIS — Z6828 Body mass index (BMI) 28.0-28.9, adult: Secondary | ICD-10-CM | POA: Diagnosis not present

## 2021-08-24 DIAGNOSIS — M546 Pain in thoracic spine: Secondary | ICD-10-CM | POA: Diagnosis not present

## 2021-08-24 DIAGNOSIS — M5416 Radiculopathy, lumbar region: Secondary | ICD-10-CM | POA: Diagnosis not present

## 2021-08-24 DIAGNOSIS — G629 Polyneuropathy, unspecified: Secondary | ICD-10-CM | POA: Diagnosis not present

## 2021-08-28 ENCOUNTER — Other Ambulatory Visit: Payer: Self-pay

## 2021-08-28 ENCOUNTER — Ambulatory Visit
Admission: RE | Admit: 2021-08-28 | Discharge: 2021-08-28 | Disposition: A | Discharge status: Home / Self Care | Payer: Medicare HMO | Source: Ambulatory Visit | Attending: Oral Surgery | Admitting: Oral Surgery

## 2021-08-28 DIAGNOSIS — S0301XA Dislocation of jaw, right side, initial encounter: Secondary | ICD-10-CM | POA: Diagnosis not present

## 2021-08-28 DIAGNOSIS — M26631 Articular disc disorder of right temporomandibular joint: Secondary | ICD-10-CM | POA: Diagnosis not present

## 2021-08-28 DIAGNOSIS — M2669 Other specified disorders of temporomandibular joint: Secondary | ICD-10-CM | POA: Diagnosis not present

## 2021-08-30 ENCOUNTER — Ambulatory Visit: Payer: Medicare HMO | Admitting: Internal Medicine

## 2021-09-11 DIAGNOSIS — Z79899 Other long term (current) drug therapy: Secondary | ICD-10-CM | POA: Diagnosis not present

## 2021-09-11 DIAGNOSIS — F902 Attention-deficit hyperactivity disorder, combined type: Secondary | ICD-10-CM | POA: Diagnosis not present

## 2021-09-12 DIAGNOSIS — F411 Generalized anxiety disorder: Secondary | ICD-10-CM | POA: Diagnosis not present

## 2021-09-12 DIAGNOSIS — F900 Attention-deficit hyperactivity disorder, predominantly inattentive type: Secondary | ICD-10-CM | POA: Diagnosis not present

## 2021-09-12 DIAGNOSIS — F331 Major depressive disorder, recurrent, moderate: Secondary | ICD-10-CM | POA: Diagnosis not present

## 2021-09-13 ENCOUNTER — Telehealth: Payer: Self-pay | Admitting: *Deleted

## 2021-09-13 NOTE — Telephone Encounter (Signed)
° °  Pre-operative Risk Assessment    Patient Name: Amy Glass  DOB: Oct 20, 1960 MRN: [id number]      Request for Surgical Clearance    Procedure:   B/L TMJ ARTHROTOMY, MENISCECTOMY, PLACEMENT ABDOMINAL FAT GRAFT   Date of Surgery:  Clearance TBD                                 Surgeon:  DR. Diona Browner, DMD Surgeon's Group or Practice Name:  [facility] Browner, DMD Phone number:  [phone] Fax number:  [phone]   Type of Clearance Requested:   - Medical    Type of Anesthesia:  General    Additional requests/questions:    Jiles Prows   09/13/2021, 1:28 PM

## 2021-09-14 ENCOUNTER — Other Ambulatory Visit: Payer: Self-pay | Admitting: Internal Medicine

## 2021-09-14 NOTE — Telephone Encounter (Signed)
° °  Patient Name: Denim Start  DOB: 12-01-1960 MRN: [id number]  Primary Cardiologist: Pixie Casino, MD  Chart reviewed as part of pre-operative protocol coverage.  Patient contacted on 09/14/2021 as part of preoperative screening process.  No answer. Left voicemail message for patient to call back.   Lenna Sciara, NP 09/14/2021, 11:03 AM

## 2021-09-17 NOTE — Telephone Encounter (Signed)
° °  Primary Cardiologist: Pixie Casino, MD  Chart reviewed as part of pre-operative protocol coverage. Given past medical history and time since last visit, based on ACC/AHA guidelines, Amy Glass would be at acceptable risk for the planned procedure without further cardiovascular testing.   Her RCRI is a class II risk, 0.9% risk of major cardiac event.  I will route this recommendation to the requesting party via Epic fax function and remove from pre-op pool.  Please call with questions.  Amy Glass. Amy Winterton NP-C    09/17/2021, 4:43 PM [address] Office [phone] Fax [phone]

## 2021-09-18 ENCOUNTER — Encounter: Payer: Self-pay | Admitting: *Deleted

## 2021-09-18 DIAGNOSIS — F411 Generalized anxiety disorder: Secondary | ICD-10-CM | POA: Diagnosis not present

## 2021-09-18 DIAGNOSIS — F331 Major depressive disorder, recurrent, moderate: Secondary | ICD-10-CM | POA: Diagnosis not present

## 2021-09-18 DIAGNOSIS — F900 Attention-deficit hyperactivity disorder, predominantly inattentive type: Secondary | ICD-10-CM | POA: Diagnosis not present

## 2021-09-18 DIAGNOSIS — Z006 Encounter for examination for normal comparison and control in clinical research program: Secondary | ICD-10-CM

## 2021-09-18 NOTE — Research (Signed)
Spoke with Amy Glass about Core research states that she would like more information about Core. Emailed a copy of the consent for her to review, and encouraged her to call with any questions.

## 2021-09-21 ENCOUNTER — Encounter: Payer: Self-pay | Admitting: *Deleted

## 2021-09-21 DIAGNOSIS — Z006 Encounter for examination for normal comparison and control in clinical research program: Secondary | ICD-10-CM

## 2021-09-21 NOTE — Research (Signed)
Spoke with Ms Paulus about Core research states she would like to come in to be screened. Appointment made For March 28 At 0830

## 2021-09-25 NOTE — Telephone Encounter (Signed)
Clearance notes have been re-faxed today.  

## 2021-09-25 NOTE — Telephone Encounter (Signed)
Patient is following up. She states Dr. Parke Simmers office never received our recommendation. She would like to have it sent again. ?

## 2021-09-26 ENCOUNTER — Other Ambulatory Visit: Payer: Self-pay | Admitting: Internal Medicine

## 2021-09-28 DIAGNOSIS — F411 Generalized anxiety disorder: Secondary | ICD-10-CM | POA: Diagnosis not present

## 2021-09-28 DIAGNOSIS — F331 Major depressive disorder, recurrent, moderate: Secondary | ICD-10-CM | POA: Diagnosis not present

## 2021-09-28 DIAGNOSIS — F900 Attention-deficit hyperactivity disorder, predominantly inattentive type: Secondary | ICD-10-CM | POA: Diagnosis not present

## 2021-10-02 DIAGNOSIS — F411 Generalized anxiety disorder: Secondary | ICD-10-CM | POA: Diagnosis not present

## 2021-10-02 DIAGNOSIS — F331 Major depressive disorder, recurrent, moderate: Secondary | ICD-10-CM | POA: Diagnosis not present

## 2021-10-02 DIAGNOSIS — F900 Attention-deficit hyperactivity disorder, predominantly inattentive type: Secondary | ICD-10-CM | POA: Diagnosis not present

## 2021-10-06 DIAGNOSIS — M546 Pain in thoracic spine: Secondary | ICD-10-CM | POA: Diagnosis not present

## 2021-10-06 DIAGNOSIS — Z6829 Body mass index (BMI) 29.0-29.9, adult: Secondary | ICD-10-CM | POA: Diagnosis not present

## 2021-10-06 DIAGNOSIS — M542 Cervicalgia: Secondary | ICD-10-CM | POA: Diagnosis not present

## 2021-10-06 DIAGNOSIS — R030 Elevated blood-pressure reading, without diagnosis of hypertension: Secondary | ICD-10-CM | POA: Diagnosis not present

## 2021-10-06 DIAGNOSIS — G629 Polyneuropathy, unspecified: Secondary | ICD-10-CM | POA: Diagnosis not present

## 2021-10-06 DIAGNOSIS — Z79899 Other long term (current) drug therapy: Secondary | ICD-10-CM | POA: Diagnosis not present

## 2021-10-06 DIAGNOSIS — M5416 Radiculopathy, lumbar region: Secondary | ICD-10-CM | POA: Diagnosis not present

## 2021-10-09 DIAGNOSIS — F331 Major depressive disorder, recurrent, moderate: Secondary | ICD-10-CM | POA: Diagnosis not present

## 2021-10-09 DIAGNOSIS — F411 Generalized anxiety disorder: Secondary | ICD-10-CM | POA: Diagnosis not present

## 2021-10-09 DIAGNOSIS — F900 Attention-deficit hyperactivity disorder, predominantly inattentive type: Secondary | ICD-10-CM | POA: Diagnosis not present

## 2021-10-10 DIAGNOSIS — Z79899 Other long term (current) drug therapy: Secondary | ICD-10-CM | POA: Diagnosis not present

## 2021-10-11 DIAGNOSIS — F902 Attention-deficit hyperactivity disorder, combined type: Secondary | ICD-10-CM | POA: Diagnosis not present

## 2021-10-11 DIAGNOSIS — Z6829 Body mass index (BMI) 29.0-29.9, adult: Secondary | ICD-10-CM | POA: Diagnosis not present

## 2021-10-15 ENCOUNTER — Encounter: Payer: Self-pay | Admitting: *Deleted

## 2021-10-15 DIAGNOSIS — Z006 Encounter for examination for normal comparison and control in clinical research program: Secondary | ICD-10-CM

## 2021-10-15 NOTE — Research (Signed)
Amy Glass called to remind her of [place] appointment. Voices understanding. My chart also sent.  ?

## 2021-10-16 ENCOUNTER — Encounter: Payer: Medicare HMO | Admitting: *Deleted

## 2021-10-16 ENCOUNTER — Other Ambulatory Visit: Payer: Self-pay

## 2021-10-16 VITALS — BP 146/89 | HR 77 | Temp 98.6°F | Resp 16 | Ht 59.0 in | Wt 143.8 lb

## 2021-10-16 DIAGNOSIS — F411 Generalized anxiety disorder: Secondary | ICD-10-CM | POA: Diagnosis not present

## 2021-10-16 DIAGNOSIS — Z006 Encounter for examination for normal comparison and control in clinical research program: Secondary | ICD-10-CM

## 2021-10-16 DIAGNOSIS — F900 Attention-deficit hyperactivity disorder, predominantly inattentive type: Secondary | ICD-10-CM | POA: Diagnosis not present

## 2021-10-16 DIAGNOSIS — F331 Major depressive disorder, recurrent, moderate: Secondary | ICD-10-CM | POA: Diagnosis not present

## 2021-10-16 NOTE — Research (Addendum)
Subject came into the research clinic today (March 28th, 2023) for the Screening Run-In Visit for the CORE research study.  Subject signed the informed consent under protocol amendment 2, Version 11 Oct 2020, before any assessments were completed.      Subject Name: Winni Ehrhard  Subject met inclusion and exclusion criteria.  The informed consent form, study requirements and expectations were reviewed with the subject and questions and concerns were addressed prior to the signing of the consent form.  The subject verbalized understanding of the trial requirements.  The subject agreed to participate in the Core  trial and signed the informed consent at 0850 on October 16, 2021.  The informed consent was obtained prior to performance of any protocol-specific procedures for the subject.  A copy of the signed informed consent was given to the subject and a copy was placed in the subject's medical record.   Naquan Garman Ward  Protocol number 1 Amendment 2  Consent version  2      Screening Run-In Clinic Visit    Date of Visit: October 16, 2021   Subject #: [id number]     During this visit the following activities were completed:  [x] Reading, Signing and Understanding the informed Consent   [x] Review Inclusion/Exclusion Criteria  [x] Vital Signs, Height, & Weight:  - Blood pressure: 146/89(Subject sat supine for at least 5 minutes before blood pressure was performed) - Heart rate:77 - Temperature:98.6 - Respiratory Rate:16 - Oxygen Saturation 99% - Weight: 143.8 lbs - Height: 4 ft 11 in  [x] Physical Exam done by PI or Sub-I Dr Riley Kill   [x] Review Subjects Medical History & Concomitant Medications  [x] Review Any Adverse Events/ Serious Adverse Events  [x] Review of any ER Visits, Hospitalizations and Inpatient Days  [x] 12-Lead ECG (Subject sat supine for at least 5 minutes before this was performed)  *All ECG's completed will be available in subjects binder    [x]  Subject fasting   [x]  Blood and Urine specimens collected per protocol   [] Genetic Testing Completed (Only for patients with suspected FCS)  [x] Extended Urinalysis/Pregnancy Test (if woman of childbearing age)  [x] Diet/Lifestyle/Alcohol Counseling with Subject  [x] FCS Symptoms 2 Week Recall  [x] Education/teaching subject on importance of completing the daily diary   [x] Education on the importance of complying with contraception precautions during study with subject agreement   Ms Sandeen reports no visits to the Urgent care, ED , or PCP. No c/o abd pain, or any other pain, no n/v, or diarrhea. Dr Riley Kill in to do exam at 0905. Blood work at 1610 Urine obtained at 0925 EKG at 0911.  Next appointment scheduled for April 11 at 0830   She reports she takes  Amlodipine for High blood pressure started taking 04-27-20 Adderall  2x daily for ADHD started taking 09-08-18 (dose changed to 20 mg on 02-14-21) Abilify every day for depression started taking 06-30-19 (dose changed to 2 mg 11-08-20) ASA daily to protect her heart started taking 2022 Lipitor daly for high cholesterol started taking 10-01-13 (dose changed to 20 mg on 04-30-20) Pristiq daily for depression started taking 02-15-21 Flonase takes as needed for allergies started taking 12-21-15 Vascepa 2x daily for hight cholesterol started taking 05-28-18 Janumet daily for DM started taking 11-25-14 Cozaar for hypertension  started taking 10-22-18 Mycostatin prn for intertrigo started taking 08-07-21 Pataday eye drops prn for itching eyes started taking 05-04-21 Prilosec daily for Reflux started taking 03-28-15 Ultracet daily for pain started taking3-13-15 Trazodone daily at  bedtime for sleep started taking 10-08-14   Current Outpatient Medications:    Accu-Chek Softclix Lancets lancets, Use as directed to test blood sugar once daily, Disp: 100 each, Rfl: 2   amLODipine (NORVASC) 10 MG tablet, TAKE 1 TABLET BY MOUTH EVERY DAY, Disp: 90 tablet,  Rfl: 1   amphetamine-dextroamphetamine (ADDERALL) 20 MG tablet, , Disp: , Rfl:    ARIPiprazole (ABILIFY) 2 MG tablet, , Disp: , Rfl:    aspirin EC 81 MG tablet, Take 81 mg by mouth daily. Swallow whole., Disp: , Rfl:    atorvastatin (LIPITOR) 20 MG tablet, TAKE 1 TABLET EVERY DAY, Disp: 90 tablet, Rfl: 1   Blood Glucose Monitoring Suppl (ACCU-CHEK GUIDE) w/Device KIT, 1 each by Does not apply route daily. Use as directed to test blood sugar once daily, Disp: 1 kit, Rfl: 0   desvenlafaxine (PRISTIQ) 50 MG 24 hr tablet, , Disp: , Rfl:    fluticasone (FLONASE) 50 MCG/ACT nasal spray, Apply 2 sprays each nostril daily for 1-2 weeks at a time before stopping once nasal congestion improves for maximum benefit, Disp: 16 g, Rfl: 5   glucose blood (ACCU-CHEK GUIDE) test strip, Use as directed to test blood sugar once daily, Disp: 100 each, Rfl: 2   icosapent Ethyl (VASCEPA) 1 g capsule, Take 2 capsules (2 g total) by mouth 2 (two) times daily., Disp: 360 capsule, Rfl: 2   JANUMET 50-1000 MG tablet, TAKE 1 TABLET EVERY DAY, Disp: 90 tablet, Rfl: 0   losartan (COZAAR) 100 MG tablet, TAKE 1 TABLET EVERY DAY, Disp: 90 tablet, Rfl: 1   nystatin cream (MYCOSTATIN), Apply 1 application topically 2 (two) times daily., Disp: 30 g, Rfl: 2   Olopatadine HCl (PATADAY) 0.2 % SOLN, Place 1 drop into both eyes 1 day or 1 dose., Disp: 2.5 mL, Rfl: 5   omeprazole (PRILOSEC) 40 MG capsule, Take 1 capsule (40 mg total) by mouth daily. MUST KEEP 11/06/21 OFFICE VISIT FOR FURTHER REFILLS, Disp: 90 capsule, Rfl: 0   traMADol-acetaminophen (ULTRACET) 37.5-325 MG tablet, , Disp: , Rfl:    traZODone (DESYREL) 100 MG tablet, Take 1 tablet (100 mg total) by mouth at bedtime as needed for sleep., Disp: 90 tablet, Rfl: 2  Patient seen today.  Previously seen by Dr. Rennis Golden for primary hypertriglyceridemia, HTN, DM, and positive calcium score.  She is a candidate for the Core study, and has been enrolled for the roll in phase.  Discussed  at length with the patient today, including the purpose, nature of randomization and reasons for that approach, potential risks and benefits of treatment.  She wishes to proceed after reading the consents.  Exam  Alert, oriented female in no distress BP 146/89  P 77 res  unlabored T afebrile Lungs clear to auscultation and percussion No carotid bruits Cardiac regular without murmur Abdomen soft No extremity edema.  Patient wishes to proceed and enters the roll in phase of CORE.    Arturo Morton. Riley Kill, MD, [facility] Medical Director, [facility]

## 2021-10-17 ENCOUNTER — Other Ambulatory Visit: Payer: Self-pay

## 2021-10-17 MED ORDER — FEXOFENADINE HCL 180 MG PO TABS: 180.0000 mg | ORAL_TABLET | Freq: Every day | ORAL | 0 refills | Status: DC

## 2021-10-18 NOTE — Research (Addendum)
CORE  Screening Run-In Visit Lab Results ? ? ? ? ? ? ?  ? ? ? ? ? ?CORE SCREENING RUN-IN LAB RESULTS ?ABNORMAL LABS: ?            Urine Results ?Urine albumin                                           4.17 ?Mucous                                                     1+ ?Squamous epithelial cells                         6-9 ?Uric acid                                                    Present ?Urinary Red Blood cells                            Occ ?Urinary White Blood cells                         10-15 ?Leukocyte Esterase                                  500 ?Protein                                                       30 ?             LIPIDS ?Non-HDLc                                                 168 ?Apolipoprotein CIII                                     33.42 ?Apolipoprotein AI                                       120 ?HDL-C                                                        22 ?TG  654 ?            CHEMISTRY ?Glucose                                                      186 ?GGT                                                            51 ?Hs-C-Reactive protein                                13.6 ?HgbA1c                                                       8.2 ? ?Are these clinically significant? ?'[]'$  YES              '[x]'$  NO  ? ?Urinalysis does not appear to be a clean catch - would check with patient (as already discussed) to see if she has symptoms. Given the elevated urine uric acid, pt should have serum uric acid checked by PCP - may be at risk for gout. ? ?Pixie Casino, MD, [facility], FACP  ?[place]  ?Medical Director of the Advanced Lipid Disorders &  ?Cardiovascular Risk Reduction Clinic ?Diplomate of the [employer] of Clinical Lipidology ?Attending Cardiologist  ?Direct Dial: [phone]  Fax: [phone]  ?Website:  [email or web address] ? ?

## 2021-10-19 ENCOUNTER — Other Ambulatory Visit: Payer: Self-pay | Admitting: *Deleted

## 2021-10-19 ENCOUNTER — Telehealth: Payer: Self-pay | Admitting: Internal Medicine

## 2021-10-19 DIAGNOSIS — Z006 Encounter for examination for normal comparison and control in clinical research program: Secondary | ICD-10-CM

## 2021-10-19 MED ORDER — ONETOUCH DELICA LANCETS 33G MISC: 2 refills | Status: DC

## 2021-10-19 MED ORDER — ONETOUCH VERIO FLEX SYSTEM W/DEVICE KIT: PACK | 0 refills | Status: DC

## 2021-10-19 MED ORDER — ONETOUCH VERIO VI STRP: ORAL_STRIP | 2 refills | Status: DC

## 2021-10-19 MED ORDER — FEXOFENADINE HCL 180 MG PO TABS: 180.0000 mg | ORAL_TABLET | Freq: Every day | ORAL | 1 refills | Status: DC

## 2021-10-19 NOTE — Research (Signed)
Patient is participating in the CORE trial. Urine sample collected on the screening visit noted uric acid present. Message sent to Dr Debara Pickett concerning the urine sample results and he felt like the PCP should be notified and possibly a serum uric acid level might need to be drawn. Contacted the patient's PCP Dr Karle Plumber. ?

## 2021-10-19 NOTE — Telephone Encounter (Signed)
Medication Refill - Medication:  ?True metrix blood glucose meter, lancets test strips ? ?Has the patient contacted their pharmacy? Yes.   ? ?(Agent: If yes, when and what did the pharmacy advise?) Pharmacy called in requesting the scripts  ? ?Preferred Pharmacy (with phone number or street name):  ? ?[place], [place] Phone:  [phone]  ?Fax:  [phone]  ?  ? ? ?Has the patient been seen for an appointment in the last year OR does the patient have an upcoming appointment? Yes.   ? ?Agent: Please be advised that RX refills may take up to 3 business days. We ask that you follow-up with your pharmacy. ?

## 2021-10-22 ENCOUNTER — Encounter: Payer: Self-pay | Admitting: *Deleted

## 2021-10-22 DIAGNOSIS — Z006 Encounter for examination for normal comparison and control in clinical research program: Secondary | ICD-10-CM

## 2021-10-22 NOTE — Research (Addendum)
Spoke with Amy Glass informed her that she can continue in the process of core research. She has an appointment April 11 at 830, for quantitation visit. She voices understanding.  ?

## 2021-10-22 NOTE — Telephone Encounter (Signed)
Made pt aware

## 2021-10-23 NOTE — Research (Signed)
? ?  Amy Glass ?Screening run in visit ? ? ?

## 2021-10-26 ENCOUNTER — Ambulatory Visit: Payer: Self-pay

## 2021-10-26 NOTE — Telephone Encounter (Signed)
?  Chief Complaint: High blood glucose levels ?Symptoms: Sometimes dizzy ?Frequency: ongoing ?Pertinent Negatives: Patient denies vomiting,  ?Disposition: '[]'$ ED /'[]'$ Urgent Care (no appt availability in office) / '[x]'$ Appointment(In office/virtual)/ '[]'$  [facility]/ '[]'$ Home Care/ '[]'$ Refused Recommended Disposition /'[]'$ [facility]/ '[]'$  Follow-up with PCP ?Additional Notes: Pt has had some higher fasting blood sugars and is concerned. She feels she is eating nothing but chicken tomatoes and lettuce and still having these higher reading.  ? ? ?Summary: blood sugar medications  ? Pt called in stating her sugar levels will get high, pt states that she uses medication to help with it, but wanted to speak with a nurse whether she may need to change medication or try something different, please advise.   ?  ? ?Reason for Disposition ? Blood glucose 70-240 mg/dL (3.9 -13.3 mmol/L) ? ?Answer Assessment - Initial Assessment Questions ?1. BLOOD GLUCOSE: "What is your blood glucose level?"  ?    224 ?2. ONSET: "When did you check the blood glucose?" ?    This morning ?3. USUAL RANGE: "What is your glucose level usually?" (e.g., usual fasting morning value, usual evening value) ?    Was 28 now is in the 200's ?4. KETONES: "Do you check for ketones (urine or blood test strips)?" If yes, ask: "What does the test show now?"  ?    no ?5. TYPE 1 or 2:  "Do you know what type of diabetes you have?"  (e.g., Type 1, Type 2, Gestational; doesn't know)  ?    Type2 ?6. INSULIN: "Do you take insulin?" "What type of insulin(s) do you use? What is the mode of delivery? (syringe, pen; injection or pump)?"  ?    no ?7. DIABETES PILLS: "Do you take any pills for your diabetes?" If yes, ask: "Have you missed taking any pills recently?" ?    Yes - not missed any ?8. OTHER SYMPTOMS: "Do you have any symptoms?" (e.g., fever, frequent urination, difficulty breathing, dizziness, weakness, vomiting) ?    dizziness ?9. PREGNANCY: "Is there  any chance you are pregnant?" "When was your last menstrual period?" ?    na ? ?Protocols used: Diabetes - High Blood Sugar-A-AH ? ?

## 2021-10-29 ENCOUNTER — Encounter: Payer: Self-pay | Admitting: *Deleted

## 2021-10-29 NOTE — Research (Unsigned)
Amy Glass called to remind her of appointment tomorrow at 0830 also given the parking code. Voices understanding.  ?

## 2021-10-30 ENCOUNTER — Other Ambulatory Visit: Payer: Self-pay

## 2021-10-30 ENCOUNTER — Encounter: Payer: Medicare HMO | Admitting: *Deleted

## 2021-10-30 VITALS — BP 124/71 | HR 70 | Temp 98.3°F | Resp 16

## 2021-10-30 DIAGNOSIS — Z006 Encounter for examination for normal comparison and control in clinical research program: Secondary | ICD-10-CM

## 2021-10-30 NOTE — Research (Signed)
? ? ? ?  Screening Qualification Visit ? ? ?Subject Number: E366 ? ? ? ? ?$Re'[x]'yWH$ Inclusion/Exclusion Criteria  ? ?$R'[]'Qw$ Pregnancy Test (if applicable) ? ?$RemoveBefore'[x]'DhCxrKWFgYyKA$ Collection of Hematology and Lipid Panel ? ?$Remove'[]'fTkkiEX$ FCS Symptoms 7 Day Recall ? ?$RemoveB'[x]'VSBMwISF$ Assessment of ER Visits, Hospitalization and Inpatient Days ? ?$Remov'[x]'wbaUud$ Adverse Events and Concomitant Medications ? ?She was unable to complete Y prime due to she couldn't remember her password. Ms Kelsay came in today for the Qualification visit for Core. She reports no abdominal pain,or pain anywhere, no problems, states she feels good. She has recently started to work as a Scientist, water quality. She states that is going good. Reports no ED, Urgent care , or Dr visits since seen here last. No changes in her medications. Scheduled her next appointment for April 27th at 0830.vs taken at [place], Blood drawn at 0853. ? ? ?Current Outpatient Medications:  ?  amLODipine (NORVASC) 10 MG tablet, TAKE 1 TABLET BY MOUTH EVERY DAY, Disp: 90 tablet, Rfl: 1 ?  amphetamine-dextroamphetamine (ADDERALL) 20 MG tablet, , Disp: , Rfl:  ?  ARIPiprazole (ABILIFY) 2 MG tablet, , Disp: , Rfl:  ?  aspirin EC 81 MG tablet, Take 81 mg by mouth daily. Swallow whole., Disp: , Rfl:  ?  atorvastatin (LIPITOR) 20 MG tablet, TAKE 1 TABLET EVERY DAY, Disp: 90 tablet, Rfl: 1 ?  Blood Glucose Monitoring Suppl (ONETOUCH VERIO FLEX SYSTEM) w/Device KIT, Use as directed to test blood sugar once daily, Disp: 1 kit, Rfl: 0 ?  desvenlafaxine (PRISTIQ) 50 MG 24 hr tablet, , Disp: , Rfl:  ?  fexofenadine (ALLEGRA) 180 MG tablet, Take 1 tablet (180 mg total) by mouth daily., Disp: 90 tablet, Rfl: 1 ?  fluticasone (FLONASE) 50 MCG/ACT nasal spray, Apply 2 sprays each nostril daily for 1-2 weeks at a time before stopping once nasal congestion improves for maximum benefit, Disp: 16 g, Rfl: 5 ?  glucose blood (ONETOUCH VERIO) test strip, Use as directed to test blood sugar once daily, Disp: 100 each, Rfl: 2 ?  icosapent Ethyl (VASCEPA) 1 g capsule, Take 2  capsules (2 g total) by mouth 2 (two) times daily., Disp: 360 capsule, Rfl: 2 ?  JANUMET 50-1000 MG tablet, TAKE 1 TABLET EVERY DAY, Disp: 90 tablet, Rfl: 0 ?  losartan (COZAAR) 100 MG tablet, TAKE 1 TABLET EVERY DAY, Disp: 90 tablet, Rfl: 1 ?  nystatin cream (MYCOSTATIN), Apply 1 application topically 2 (two) times daily., Disp: 30 g, Rfl: 2 ?  Olopatadine HCl (PATADAY) 0.2 % SOLN, Place 1 drop into both eyes 1 day or 1 dose., Disp: 2.5 mL, Rfl: 5 ?  omeprazole (PRILOSEC) 40 MG capsule, Take 1 capsule (40 mg total) by mouth daily. MUST KEEP 11/06/21 OFFICE VISIT FOR FURTHER REFILLS, Disp: 90 capsule, Rfl: 0 ?  OneTouch Delica Lancets 29U MISC, Use as directed to test blood sugar once daily, Disp: 100 each, Rfl: 2 ?  traMADol-acetaminophen (ULTRACET) 37.5-325 MG tablet, , Disp: , Rfl:  ?  traZODone (DESYREL) 100 MG tablet, Take 1 tablet (100 mg total) by mouth at bedtime as needed for sleep., Disp: 90 tablet, Rfl: 2  ? ? ? ? ? ?  ?

## 2021-11-01 ENCOUNTER — Encounter: Payer: Self-pay | Admitting: *Deleted

## 2021-11-01 DIAGNOSIS — Z006 Encounter for examination for normal comparison and control in clinical research program: Secondary | ICD-10-CM

## 2021-11-01 NOTE — Research (Signed)
Spoke with Amy Glass about her TG levels. She does not want to be re-screened for Core. She is how ever interested in [name] study. I have reached out to the monitor to see if this is a possibility.  ?

## 2021-11-01 NOTE — Research (Addendum)
Amy Glass ?Screening qualification ? ? ? ? ? ? ?CORE Abnormal Lab report 30-October-2021 ?Screening Qualification ? ?Lipids:  ?Triglyceride    421 mg/dL                 '[x]'$   Clinically Significant  '[]'$ Not Clinically Significant ?HDL-Cholesterol (ppt)  23 mg/dL      '[]'$ Clinically Significant  '[x]'$ Not Clinically Significant ?Apolipoprotein Clll 29.07 mg/dL        '[]'$ Clinically Significant  '[x]'$ Not Clinically Significant ? ?She can be retested since TG were above 350. ? ? ?Any further action needed to be taken per the PI?  No ? ?Pixie Casino, MD, [facility], FACP  ?[place]  ?Medical Director of the Advanced Lipid Disorders &  ?Cardiovascular Risk Reduction Clinic ?Diplomate of the [employer] of Clinical Lipidology ?Attending Cardiologist  ?Direct Dial: [phone]  Fax: [phone]  ?Website:  [email or web address] ? ?

## 2021-11-01 NOTE — Research (Signed)
Spoke with Monitor for Essence she states its ok to screen pt for essence. Amy Glass scheduled for Essence run in on April 237 at 0830. Emailed her a copy of essence consent for her to review.  ?

## 2021-11-02 ENCOUNTER — Other Ambulatory Visit: Payer: Self-pay

## 2021-11-06 ENCOUNTER — Ambulatory Visit: Payer: Medicare HMO | Admitting: Internal Medicine

## 2021-11-06 ENCOUNTER — Ambulatory Visit (INDEPENDENT_AMBULATORY_CARE_PROVIDER_SITE_OTHER): Payer: Medicare HMO | Admitting: Internal Medicine

## 2021-11-06 ENCOUNTER — Encounter: Payer: Self-pay | Admitting: Internal Medicine

## 2021-11-06 VITALS — BP 110/80 | HR 94 | Ht 59.0 in | Wt 142.0 lb

## 2021-11-06 DIAGNOSIS — K219 Gastro-esophageal reflux disease without esophagitis: Secondary | ICD-10-CM

## 2021-11-06 DIAGNOSIS — K449 Diaphragmatic hernia without obstruction or gangrene: Secondary | ICD-10-CM

## 2021-11-06 DIAGNOSIS — F411 Generalized anxiety disorder: Secondary | ICD-10-CM | POA: Diagnosis not present

## 2021-11-06 DIAGNOSIS — F331 Major depressive disorder, recurrent, moderate: Secondary | ICD-10-CM | POA: Diagnosis not present

## 2021-11-06 DIAGNOSIS — Z86010 Personal history of colonic polyps: Secondary | ICD-10-CM | POA: Diagnosis not present

## 2021-11-06 DIAGNOSIS — F900 Attention-deficit hyperactivity disorder, predominantly inattentive type: Secondary | ICD-10-CM | POA: Diagnosis not present

## 2021-11-06 NOTE — Research (Addendum)
Amy Glass ?Screening Qualification ? ? ?

## 2021-11-06 NOTE — Progress Notes (Signed)
? ?  Subjective:  ? ? Patient ID: Amy Glass, female    DOB: [birth year], 61 y.o.   MRN: [id number] ? ?HPI ?Amy Glass is a 61 year old female with a history of GERD with small hiatal hernia, nonadvanced adenomatous colon polyp, diverticulosis, diabetes, anxiety and depression who is here for follow-up.  She is here alone today.  She was last seen at the time of her colonoscopy in November 2022. ? ?She reports that she is feeling well though she has significant breakthrough heartburn which wakes her from sleep if she eats spicy or higher fat foods after 5 PM.  She reports this can limit her ability to be social.  Happened recently after eating lasagna at a party later in the evening.  She woke up with pyrosis at 2 AM.  She has been adherent to omeprazole 40 mg daily.  No GERD symptoms during the day.  Dysphagia has resolved.  No abdominal pain and regular bowel movements ? ? ?Review of Systems ?As per HPI, otherwise negative ? ?Current Medications, Allergies, Past Medical History, Past Surgical History, Family History and Social History were reviewed in [employer] record. ? ?   ?Objective:  ? Physical Exam ?BP 110/80   Pulse 94   Ht '4\' 11"'$  (1.499 m)   Wt 142 lb (64.4 kg)   LMP 05/30/2015   BMI 28.68 kg/m?  ?Gen: awake, alert, NAD ?HEENT: anicteric, op clear ?CV: RRR, no mrg ?Pulm: CTA b/l ?Abd: soft, NT/ND, +BS throughout ?Ext: no c/c/e ?Neuro: nonfocal ? ? ?   ?Assessment & Plan:  ?61 year old female with a history of GERD with small hiatal hernia, nonadvanced adenomatous colon polyp, diverticulosis, diabetes, anxiety and depression who is here for follow-up.   ? ?GERD/small hiatal hernia --dysphagia has not recurred after dilation in 2019.  She has breakthrough heartburn related to eating later in the evening and eating spicy or higher fat foods. ?--We discussed the importance of GERD diet and lifestyle modification for possible ?--Continue omeprazole 40 mg daily, best taken  30 minutes for breakfast ?--Add famotidine 20 mg near bedtime on an as-needed basis if she eats late or eats a trigger food for her nocturnal GERD symptoms ?--Notify me if not improving ? ?2.  History of adenomatous colon polyps --repeat colonoscopy in 7 years from last exam which would be November 2029 ? ?Follow-up in 2 years, sooner if needed ? ?20 minutes total spent today including patient facing time, coordination of care, reviewing medical history/procedures/pertinent radiology studies, and documentation of the encounter. ? ? ?

## 2021-11-06 NOTE — Patient Instructions (Signed)
Continue omeprazole. ? ?Please purchase the following medications over the counter and take as directed: ?Famotidine 20 mg every night when needed for breakthrough heartburn. ? ?If you are age 60 or older, your body mass index should be between 23-30. Your Body mass index is 28.68 kg/m?Marland Kitchen If this is out of the aforementioned range listed, please consider follow up with your Primary Care Provider. ? ?If you are age 24 or younger, your body mass index should be between 19-25. Your Body mass index is 28.68 kg/m?Marland Kitchen If this is out of the aformentioned range listed, please consider follow up with your Primary Care Provider.  ? ?________________________________________________________ ? ?The [place] providers would like to encourage you to use [facility] to communicate with providers for non-urgent requests or questions.  Due to long hold times on the telephone, sending your provider a message by [facility] may be a faster and more efficient way to get a response.  Please allow 48 business hours for a response.  Please remember that this is for non-urgent requests.  ?_______________________________________________________ ? ?Due to recent changes in healthcare laws, you may see the results of your imaging and laboratory studies on MyChart before your provider has had a chance to review them.  We understand that in some cases there may be results that are confusing or concerning to you. Not all laboratory results come back in the same time frame and the provider may be waiting for multiple results in order to interpret others.  Please give [place] 48 hours in order for your provider to thoroughly review all the results before contacting the office for clarification of your results.  ? ?

## 2021-11-08 ENCOUNTER — Ambulatory Visit: Payer: Medicare HMO | Admitting: Allergy

## 2021-11-10 ENCOUNTER — Other Ambulatory Visit: Payer: Self-pay | Admitting: Internal Medicine

## 2021-11-10 DIAGNOSIS — E1169 Type 2 diabetes mellitus with other specified complication: Secondary | ICD-10-CM

## 2021-11-12 NOTE — Telephone Encounter (Signed)
Requested Prescriptions  ?Pending Prescriptions Disp Refills  ?? JANUMET 50-1000 MG tablet [Pharmacy Med Name: JANUMET 50-1000 MG Tablet] 90 tablet 0  ?  Sig: TAKE 1 TABLET EVERY DAY  ?  ? Endocrinology:  Diabetes - Biguanide + DPP-4 Inhibitor Combos Failed - 11/10/2021  2:28 PM  ?  ?  Failed - B12 Level in normal range and within 720 days  ?  Vitamin B-12  ?Date Value Ref Range Status  ?12/02/2014 1,405 (H) 211 - 911 pg/mL Final  ?   ?  ?  Failed - CBC within normal limits and completed in the last 12 months  ?  WBC  ?Date Value Ref Range Status  ?01/24/2021 6.7 3.4 - 10.8 x10E3/uL Final  ?09/21/2019 4.8 4.0 - 10.5 K/uL Final  ? ?RBC  ?Date Value Ref Range Status  ?01/24/2021 4.54 3.77 - 5.28 x10E6/uL Final  ?09/21/2019 4.83 3.87 - 5.11 MIL/uL Final  ? ?Hemoglobin  ?Date Value Ref Range Status  ?01/24/2021 12.4 11.1 - 15.9 g/dL Final  ? ?Hematocrit  ?Date Value Ref Range Status  ?01/24/2021 39.3 34.0 - 46.6 % Final  ? ?MCHC  ?Date Value Ref Range Status  ?01/24/2021 31.6 31.5 - 35.7 g/dL Final  ?09/21/2019 32.2 30.0 - 36.0 g/dL Final  ? ?MCH  ?Date Value Ref Range Status  ?01/24/2021 27.3 26.6 - 33.0 pg Final  ?09/21/2019 26.7 26.0 - 34.0 pg Final  ? ?MCV  ?Date Value Ref Range Status  ?01/24/2021 87 79 - 97 fL Final  ? ?No results found for: PLTCOUNTKUC, LABPLAT, [place] ?RDW  ?Date Value Ref Range Status  ?01/24/2021 13.3 11.7 - 15.4 % Final  ? ?  ?  ?  Passed - HBA1C is between 0 and 7.9 and within 180 days  ?  HbA1c, POC (controlled diabetic range)  ?Date Value Ref Range Status  ?08/07/2021 6.9 0.0 - 7.0 % Final  ?   ?  ?  Passed - Cr in normal range and within 360 days  ?  Creat  ?Date Value Ref Range Status  ?12/21/2015 0.56 0.50 - 1.05 mg/dL Final  ? ?Creatinine, Ser  ?Date Value Ref Range Status  ?02/26/2021 0.72 0.57 - 1.00 mg/dL Final  ?   ?  ?  Passed - eGFR in normal range and within 360 days  ?  GFR, Est African American  ?Date Value Ref Range Status  ?12/21/2015 >89 >=60 mL/min Final  ? ?GFR calc Af  Amer  ?Date Value Ref Range Status  ?04/28/2020 109 >59 mL/min/1.73 Final  ?  Comment:  ?  **[facility] currently reports eGFR in compliance with the current** ?  recommendations of the [employer]. [facility] will ?  update reporting as new guidelines are published from the NKF-ASN ?  Task force. ?  ? ?GFR, Est Non African American  ?Date Value Ref Range Status  ?12/21/2015 >89 >=60 mL/min Final  ?  Comment:  ?    ?The estimated GFR is a calculation valid for adults (>=8 years old) ?that uses the CKD-EPI algorithm to adjust for age and sex. It is   ?not to be used for children, pregnant women, hospitalized patients,    ?patients on dialysis, or with rapidly changing kidney function. ?According to the NKDEP, eGFR >89 is normal, 60-89 shows mild ?impairment, 30-59 shows moderate impairment, 15-29 shows severe ?impairment and <15 is ESRD. ?  ?  ? ?GFR calc non Af Amer  ?Date Value Ref Range Status  ?04/28/2020 94 >59  mL/min/1.73 Final  ? ?eGFR  ?Date Value Ref Range Status  ?02/26/2021 96 >59 mL/min/1.73 Final  ?   ?  ?  Passed - Valid encounter within last 6 months  ?  Recent Outpatient Visits   ?      ? 3 months ago Controlled type 2 diabetes mellitus with complication, without long-term current use of insulin ([place])  ? [place] [name], MD  ? 5 months ago Encounter for [employer] annual wellness exam  ? [place] [name], MD  ? 7 months ago Laceration of left thumb without foreign body with damage to nail, initial encounter  ? [place], [name], MD  ? 8 months ago Right lower quadrant abdominal pain  ? [place] [name], MD  ? 9 months ago Chest pain in adult  ? [place] [name], MD  ?  ?  ?Future Appointments   ?        ? Tomorrow [name], MD [place]  ?  In 1 week Thereasa Solo Casimer Bilis [place]  ?  ? ?  ?  ?  ? ?

## 2021-11-13 ENCOUNTER — Encounter: Payer: Self-pay | Admitting: Internal Medicine

## 2021-11-13 ENCOUNTER — Ambulatory Visit: Payer: Medicare HMO | Attending: Internal Medicine | Admitting: Internal Medicine

## 2021-11-13 DIAGNOSIS — F331 Major depressive disorder, recurrent, moderate: Secondary | ICD-10-CM | POA: Diagnosis not present

## 2021-11-13 DIAGNOSIS — F900 Attention-deficit hyperactivity disorder, predominantly inattentive type: Secondary | ICD-10-CM | POA: Diagnosis not present

## 2021-11-13 DIAGNOSIS — F411 Generalized anxiety disorder: Secondary | ICD-10-CM | POA: Diagnosis not present

## 2021-11-13 DIAGNOSIS — E1165 Type 2 diabetes mellitus with hyperglycemia: Secondary | ICD-10-CM | POA: Diagnosis not present

## 2021-11-13 LAB — POCT GLYCOSYLATED HEMOGLOBIN (HGB A1C): HbA1c, POC (controlled diabetic range): 8.6 % — AB (ref 0.0–7.0)

## 2021-11-13 LAB — GLUCOSE, POCT (MANUAL RESULT ENTRY): POC Glucose: 344 mg/dl — AB (ref 70–99)

## 2021-11-13 MED ORDER — JANUMET 50-1000 MG PO TABS: 1.0000 | ORAL_TABLET | Freq: Two times a day (BID) | ORAL | 4 refills | Status: DC

## 2021-11-13 NOTE — Progress Notes (Signed)
6/10 ?Back pain  ? ? ?
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
## 2021-11-14 ENCOUNTER — Other Ambulatory Visit (HOSPITAL_BASED_OUTPATIENT_CLINIC_OR_DEPARTMENT_OTHER): Payer: Self-pay | Admitting: Family

## 2021-11-14 DIAGNOSIS — E781 Pure hyperglyceridemia: Secondary | ICD-10-CM

## 2021-11-15 ENCOUNTER — Other Ambulatory Visit: Payer: Self-pay

## 2021-11-15 ENCOUNTER — Encounter: Payer: Medicare HMO | Admitting: *Deleted

## 2021-11-15 VITALS — BP 106/63 | HR 76 | Temp 97.7°F | Resp 16 | Ht 59.0 in | Wt 144.0 lb

## 2021-11-15 DIAGNOSIS — Z006 Encounter for examination for normal comparison and control in clinical research program: Secondary | ICD-10-CM

## 2021-11-15 NOTE — Research (Deleted)
meds

## 2021-11-15 NOTE — Research (Signed)
?  Current Outpatient Medications:  ?  amLODipine (NORVASC) 10 MG tablet, TAKE 1 TABLET BY MOUTH EVERY DAY, Disp: 90 tablet, Rfl: 1 ?  amphetamine-dextroamphetamine (ADDERALL) 20 MG tablet, , Disp: , Rfl:  ?  ARIPiprazole (ABILIFY) 2 MG tablet, , Disp: , Rfl:  ?  aspirin EC 81 MG tablet, Take 81 mg by mouth daily. Swallow whole., Disp: , Rfl:  ?  atorvastatin (LIPITOR) 20 MG tablet, TAKE 1 TABLET EVERY DAY, Disp: 90 tablet, Rfl: 1 ?  Blood Glucose Monitoring Suppl (ONETOUCH VERIO FLEX SYSTEM) w/Device KIT, Use as directed to test blood sugar once daily, Disp: 1 kit, Rfl: 0 ?  desvenlafaxine (PRISTIQ) 50 MG 24 hr tablet, , Disp: , Rfl:  ?  fexofenadine (ALLEGRA) 180 MG tablet, Take 1 tablet (180 mg total) by mouth daily., Disp: 90 tablet, Rfl: 1 ?  fluticasone (FLONASE) 50 MCG/ACT nasal spray, Apply 2 sprays each nostril daily for 1-2 weeks at a time before stopping once nasal congestion improves for maximum benefit, Disp: 16 g, Rfl: 5 ?  glucose blood (ONETOUCH VERIO) test strip, Use as directed to test blood sugar once daily, Disp: 100 each, Rfl: 2 ?  icosapent Ethyl (VASCEPA) 1 g capsule, Take 2 capsules (2 g total) by mouth 2 (two) times daily., Disp: 360 capsule, Rfl: 2 ?  losartan (COZAAR) 100 MG tablet, TAKE 1 TABLET EVERY DAY, Disp: 90 tablet, Rfl: 1 ?  nystatin cream (MYCOSTATIN), Apply 1 application topically 2 (two) times daily., Disp: 30 g, Rfl: 2 ?  Olopatadine HCl (PATADAY) 0.2 % SOLN, Place 1 drop into both eyes 1 day or 1 dose., Disp: 2.5 mL, Rfl: 5 ?  omeprazole (PRILOSEC) 40 MG capsule, Take 1 capsule (40 mg total) by mouth daily. MUST KEEP 11/06/21 OFFICE VISIT FOR FURTHER REFILLS, Disp: 90 capsule, Rfl: 0 ?  OneTouch Delica Lancets 46K MISC, Use as directed to test blood sugar once daily, Disp: 100 each, Rfl: 2 ?  sitaGLIPtin-metformin (JANUMET) 50-1000 MG tablet, Take 1 tablet by mouth 2 (two) times daily with a meal., Disp: 180 tablet, Rfl: 4 ?  traMADol-acetaminophen (ULTRACET) 37.5-325 MG  tablet, , Disp: , Rfl:  ?  traZODone (DESYREL) 100 MG tablet, Take 1 tablet (100 mg total) by mouth at bedtime as needed for sleep., Disp: 90 tablet, Rfl: 2  ? ? ?Ms Sanjose reports she takes Norvasc for hypertension started 04-27-20 ?Adderall for ADHD started 09-08-18 ?Abilify for depression started 06-30-19 ?ASA for heart protections started 04-23-21 ?Lipitor for high cholesterol started 10-01-13 ?Pristiq for depression started 02-15-21 ?Flonase for allergies started 12-21-15 ?Vascepa for Hypertriglyceridemia started 12-21-15 ?Janumet for Dm started 11-25-14 ?Cozaar for Hypertension started 10-22-18 ?Mycostatin for intertrigo started 08-07-21 ?Pataday for itching eyes started 08-07-21 ?Prilosec for reflux started 05-04-21 ?Ultracet for pain started 10-01-13 ?Trazodone for sleep started 10-08-14 ? ? ?

## 2021-11-15 NOTE — Research (Signed)
? ?[facility] Consent  ? ? ? ?Subject Name: INCLUSION CRITERIA ?Consent      '[x]'   ? Pregnancy authorization '[]'   ?AGE 60 or greater '[x]'   ?Triglycerides fasting 150 or greater with either: '[x]'   ?Dx of ASCVD (CAD, CVA, PAD) OR '[]'   ?Increased risk for ASCVD as below '[x]'   ?Type 2 DM OR 2 or more below '[x]'   ?Men 24 or greater ?Woman 31 or greater '[]'  ?'[]'  ?  ?Woman with Hx of preeclampsia or premature menopause (before 10) '[]'  ?  ?Family Hx of premature ASCDD (Before 27 for males, or before 62 for females  '[]'  ?  ?Current Tobacco use ? '[]'  ?  ?Metabolic syndrome '[]'  ?  ?Hypertension with Treatment ? '[x]'  ?  ?CKD stage 3 Or (GFR 30-59) ? '[]'  ?  ?LDL-C 160 or greater ?LDL-C 100 or greater on therapy to lower ? '[]'  ?  ?Elevated high-sensitivity C Reactive protein (>2.0) ? '[]'   ?Elevated lipoprotein (a) (>2m/dL or 124nmol/L) OR ? '[]'   ?Triglycerides fasting 500 or greater ? '[]'   ?Lipid-lowering med (for at least 4 weeks) ?[place] to comply with diet and lifestyle recommendations ? '[]'   ?Females must be non-pregnant and non-lactating and EITHER ? '[]'   ?Surgically sterile, post-menopausal, abstinent OR ?Use highly effective contraceptive at time of consent until at least 30 weeks after last dose of study drug ? '[]'   ?Males must be surgical sterile, abstinent or using a highly effective contraceptive at time of consent until at least 30 weeks after the last dose of study drug  ? '[]'   ?  EXCLUSION CRITERIA           N/A                                            '[x]'  ?Major surgery, peripheral revascularization, or non-urgent PCI within 3 months prior to screening, or planned major surgery or major procedure during the study '[]'   ?Active pancreatitis within 4 weeks prior to screening '[]'   ?Acute coronary syndrome or CVA/TIA within 3 months of screening '[]'   ?Screening labs: ?ALT or AST >3.0 x ULN ?Total bilirubin >1.5 ULN unless due to Gilbert's syndrome ?GFR >30 ?Urine Protein/creatine ratio >500 ?Uncontrolled HTN (BP>180/100 despite  Treatment ?Uncontrolled hypothyroidism TSH>1.5 and T4 < LLN, or Hormone therapy not stable for 4 weeks or greater ? '[]'  ?'[]'  ?'[]'  ?'[]'  ?'[]'  ?'[]'   ?DM newly dx within 12 weeks of screening ?A1c > 9.5 at screening ? '[]'  ?'[]'   ?Change in basal insulin >20% within 3 months prior to screening ? '[]'   ?Type 1 Dm: episode of DKA or > 3 episodes of severe hypo glycerides with on 6 months prior to screening   ?Active infections, HIV, Hep C, Hep B '[]'   ?Active infection requiring systemic antiviral or antimicrobial tx that will not be complete prior to study day 1 or active Covid 19 infection not resolved by study day 1 '[]'   ?Malignancy within 5 years (except for non-melanoma skin ca, cervical in situ ca, breast ductal ca in situ or stage 1 prostate Ca that has been tx '[]'   ?Hypersensitivity to the active substance (olezarsen or placebo) '[]'   ?Tx with another investigational drug or devise within 1 month or screening  '[]'   ?Previous tx with an oligonucleotide within 4 months of screening '[]'   ?Con meds/ procedure restrictions:   ?Systemic corticosteroids of anabolic steroids  within 6 weeks prior to screening and during the study unless approved  ? '[]'   ?Use of bile acids resins (colestipol or Colesevelam) within 4 weeks prior to screening or planned during the study ? '[]'   ?Plasma apheresis within 4 weeks prior to screening or planned during the study ? '[]'   ?Change in meds known to exacerbate hypertriglyceridemia (beta blockers, thiazides, isotretinoin, oral antidiabetic meds, tamoxifen, estrogens or progestins within 4 weeks prior to screening ? '[]'  ?  ?Change or expected need for significant change in titration ?of therapies known to significantly reduce TG (GLP-1 agonists, other incretin mimetics, Phentermine/topiramate, naltrexone/bupropion, Xenical, or bariatric surgery within 3 months prior to screening ? '[]'  ?  ?Change in antipsychotic meds within 30 days of screening or ?>435m within 60 days of Screening  '[]'  ?  ?Blood or plasma donation  of 50-4982mwithin 30 days of screening or>499 within 60 days of screening '[]'   ?Unwilling to comply with procedures, following up, or unwillingness to cooperate fully with the investigator '[]'   ?ETOH abuse or recent (<1 year) or other substance abuse '[]'   ? ?Amy Glass ?Subject met inclusion and exclusion criteria.  The informed consent form, study requirements and expectations were reviewed with the subject and questions and concerns were addressed prior to the signing of the consent form.  The subject verbalized understanding of the trial requirements.  The subject agreed to participate in the Core  trial and signed the informed consent at 0855 on 15-November-2021.  The informed consent was obtained prior to performance of any protocol-specific procedures for the subject.  A copy of the signed informed consent was given to the subject and a copy was placed in the subject's medical record.  ? ?ScBeverly Gustard  ?Protocol number 1 ?Consent version  2 [facility]  [id number]  Site [id number] ? ?SUBJECT ID:  [id number]                       DATE:    15-November-2021      ?'[]'  FEMALE                            '[x]'  FEMALE ?AGE: ?ETHINICITY:   '[x]'  HISPANIC/LATINO      '[]'  NON- HISPANIC/LATINO ?RACE:           '[]'   WHITE             '[]'  BLACK/AFRICAN AMERICAN ?                        '[]'   ASIAN              '[]'  AMERICAN INDIAN/[place] NATIVE ?                        '[]'   NATIVE [place]/OTHER [place] ISLANDER ?                        '[]'   OTHER ? ?FUTURE RESEARCH ?'[x]'  USE OF SAMPLES FOR FUTURE RESEARCH ?'[x]'  Consented for Sub study CTA  ? ?Ms NeToelleere for run in visit for Essence research. She denies any abd pain, kor any pain.  Vs taken at 0859 BP 106/63 HR 76 Temp 97.7, Spo2 98%, Resp 16 Weight 144 lbs, Height 4 ft 11 in. Blood work drawn at [employer] obtained at [id number] HiDebara Pickettn  to complete exam EKG complete at 0916 and reviewed by Dr Debara Pickett.  Medications reviewed no changes noted.  ? ? ? ? ? ?Screening Run-In Clinic Visit [facility]   ? ? ? ?Date of Visit:15-November-2021   Subject #: [id number] ? ? ? ?During this visit the following activities were completed: ? ?'[x]' Reading, Signing and Understanding the informed Consent  ? ?'[x]' Review Inclusion/Exclusion Criteria ? ?'[x]' Vital Signs, Height, & Weight: ? ? ? ?'[x]' Physical Exam done by PI or Sub-I   By Dr Debara Pickett  ? ?'[x]' Review Subjects Medical History & Concomitant Medications ? ?'[x]' Review Any Adverse Events/ Serious Adverse Events ? ?'[x]' Review of any ER Visits, Hospitalizations and Inpatient Days ? ?'[x]' 12-Lead ECG (Subject sat supine for at least 5 minutes before this was performed)  ?*All ECG's completed will be available in subjects binder  ? ?'[x]'  Subject fasting  ? ?'[x]'  Blood and Urine specimens collected per protocol  ? ? ?'[x]' Extended Urinalysis/Pregnancy Test (if woman of childbearing age) ? ?'[x]' Diet/Lifestyle/Alcohol Counseling with Subject ? ? ?'[x]' Education on the importance of complying with contraception precautions during study with subject agreement  ?

## 2021-11-16 NOTE — Telephone Encounter (Signed)
Rx(s) sent to pharmacy electronically.  

## 2021-11-19 NOTE — Research (Addendum)
?  Amy Glass ?MRN [id number] ?Essence Screening run in  ? ? ? ? ? ? ? ? ? ? ? ? ?CORE Abnormal Lab report 15-November-2021 ? ?Chemistry: ?Sodium   133  mmol/L                '[]'$ Clinically Significant  '[x]'$ Not Clinically Significant  ?Glucose  212   mg/dL                 '[]'$ Clinically Significant  '[x]'$ Not Clinically Significant ?Magnesium 1.7  mg/dL               '[]'$ Clinically Significant  '[x]'$ Not Clinically Significant ?Hemoglobin A1c 8.6 %               '[]'$ Clinically Significant  '[x]'$ Not Clinically Significant ? ?Gamma Glutamyl Transfers (GGT) 40 U/L  '[]'$ Clinically Significant  '[x]'$ Not Clinically Significant ? ?Urinalysis: ?Protein 10 mg/dL                             '[]'$ Clinically Significant  '[x]'$ Not Clinically Significant ?Leukocyte Esterase 500                   '[]'$ Clinically Significant  '[x]'$ Not Clinically Significant ?Urinary White Blood cells 30-49        '[]'$ Clinically Significant  '[x]'$ Not Clinically Significant ?Uric Acid Crystals    Present             '[]'$ Clinically Significant  '[x]'$ Not Clinically Significant ?Squamous Epithelial Cells 6-9          '[]'$ Clinically Significant  '[x]'$ Not Clinically Significant ?Mucus 1+                                           '[]'$ Clinically Significant  '[x]'$ Not Clinically Significant ? ?Urine Chemistry: ?Urine Albumin 3.34 mg/dL                '[]'$ Clinically Significant  '[x]'$ Not Clinically Significant ?Albumin Creatinine Ratio 45 mg/g    '[]'$ Clinically Significant  '[x]'$ Not Clinically Significant ?                                    ? ?Lipids:  ?Triglyceride  1359  mg/dL               '[]'$ Clinically Significant  '[x]'$ Not Clinically Significant ?Total Cholesterol 245 mg/dL           '[]'$ Clinically Significant  '[x]'$ Not Clinically Significant ?HDL-Cholesterol 21 mg/dL              '[]'$ Clinically Significant  '[]'$ Not Clinically Significant ?Apolipoprotein  B 116.0 mg/dL         '[]'$ Clinically Significant  '[x]'$ Not Clinically Significant ?Apolipoprotein Clll 57.09 mg/dL        '[]'$ Clinically Significant  '[x]'$ Not Clinically  Significant ?Non-HDL Cholesterol (calc)  224 mg/dL   '[]'$ Clinically Significant  '[x]'$ Not Clinically Significant ? ? ?Any further action needed to be taken per the PI? No ? ?Pixie Casino, MD, [facility], FACP  ?[place]  ?Medical Director of the Advanced Lipid Disorders &  ?Cardiovascular Risk Reduction Clinic ?Diplomate of the [employer] of Clinical Lipidology ?Attending Cardiologist  ?Direct Dial: [phone]  Fax: [phone]  ?Website:  [email or web address] ? ?

## 2021-11-21 ENCOUNTER — Ambulatory Visit: Payer: Medicare HMO | Admitting: Physician Assistant

## 2021-11-21 NOTE — Research (Signed)
Amy Glass ?Screening run in visit ? ?

## 2021-11-22 NOTE — Research (Signed)
Amy Glass ?MRN [id number] ?Core screening Qualification ? ? ?

## 2021-11-23 DIAGNOSIS — G4700 Insomnia, unspecified: Secondary | ICD-10-CM | POA: Diagnosis not present

## 2021-11-23 DIAGNOSIS — Z6829 Body mass index (BMI) 29.0-29.9, adult: Secondary | ICD-10-CM | POA: Diagnosis not present

## 2021-11-23 DIAGNOSIS — F411 Generalized anxiety disorder: Secondary | ICD-10-CM | POA: Diagnosis not present

## 2021-11-23 DIAGNOSIS — Z79899 Other long term (current) drug therapy: Secondary | ICD-10-CM | POA: Diagnosis not present

## 2021-11-23 DIAGNOSIS — Z1331 Encounter for screening for depression: Secondary | ICD-10-CM | POA: Diagnosis not present

## 2021-11-23 DIAGNOSIS — F3341 Major depressive disorder, recurrent, in partial remission: Secondary | ICD-10-CM | POA: Diagnosis not present

## 2021-11-23 DIAGNOSIS — F909 Attention-deficit hyperactivity disorder, unspecified type: Secondary | ICD-10-CM | POA: Diagnosis not present

## 2021-11-24 ENCOUNTER — Other Ambulatory Visit: Payer: Self-pay | Admitting: Allergy

## 2021-11-28 DIAGNOSIS — M539 Dorsopathy, unspecified: Secondary | ICD-10-CM | POA: Diagnosis not present

## 2021-11-28 DIAGNOSIS — E1165 Type 2 diabetes mellitus with hyperglycemia: Secondary | ICD-10-CM | POA: Diagnosis not present

## 2021-11-28 DIAGNOSIS — M549 Dorsalgia, unspecified: Secondary | ICD-10-CM | POA: Diagnosis not present

## 2021-11-28 DIAGNOSIS — Z0130 Encounter for examination of blood pressure without abnormal findings: Secondary | ICD-10-CM | POA: Diagnosis not present

## 2021-11-28 DIAGNOSIS — G8929 Other chronic pain: Secondary | ICD-10-CM | POA: Diagnosis not present

## 2021-11-28 DIAGNOSIS — Z6829 Body mass index (BMI) 29.0-29.9, adult: Secondary | ICD-10-CM | POA: Diagnosis not present

## 2021-11-28 DIAGNOSIS — I10 Essential (primary) hypertension: Secondary | ICD-10-CM | POA: Diagnosis not present

## 2021-11-28 DIAGNOSIS — E7800 Pure hypercholesterolemia, unspecified: Secondary | ICD-10-CM | POA: Diagnosis not present

## 2021-12-05 ENCOUNTER — Encounter: Payer: Medicare HMO | Admitting: *Deleted

## 2021-12-05 ENCOUNTER — Other Ambulatory Visit: Payer: Self-pay

## 2021-12-05 VITALS — BP 122/84 | HR 71 | Temp 97.5°F | Resp 18

## 2021-12-05 DIAGNOSIS — Z006 Encounter for examination for normal comparison and control in clinical research program: Secondary | ICD-10-CM

## 2021-12-05 NOTE — Research (Signed)
     Qualification Visit for Amy Glass     Subject Number: [id number]                   Date: 12-05-2021    '[x]'$ Vital Signs Collected - Blood Pressure: 122/84 - Heart Rate:71 - Respiratory Rate:18 - Temperature:97.5 (36.38 C) - Oxygen Saturation:99%  '[x]'$  Lab collection per protocol '[x]'$  Assessment of ER Visits, Hospitalizations, and Inpatient Days  '[x]'$  Adverse Events and Concomitant Medications  '[x]'$  Diet, Lifestyle, and Alcohol Counseling   Amy Glass here for Amy Glass Qualification visit. She reports no abd pain, or any pain. No visits to ED, Urgent care, or her PCP since last seen. No changes in her meds. VS taken at 0919. Blood work at [employer] [employer].

## 2021-12-07 NOTE — Research (Addendum)
Essence Qualification visit [phone]    ABNORMAL LABS: TEST RESULT  lymphocyte 47.4  Apolipoprotein CIII 30.02  Apolipoprotein B 106  HDL 28  Triglyceride            456 Are these clinically significant? '[]'$  YES              '[x]'$  NO  Pixie Casino, MD, [facility], [place] Director of the Advanced Lipid Disorders &  Cardiovascular Risk Reduction Clinic Diplomate of the American Board of Clinical Lipidology Attending Cardiologist  Direct Dial: [phone]  Fax: [phone]  Website:  [email or web address]

## 2021-12-10 NOTE — Research (Signed)
ESSENCE QUALIFICATION VISIT [phone]  LAB WITHIN NORMAL LIMITS

## 2021-12-11 ENCOUNTER — Encounter: Payer: Self-pay | Admitting: *Deleted

## 2021-12-11 DIAGNOSIS — F331 Major depressive disorder, recurrent, moderate: Secondary | ICD-10-CM | POA: Diagnosis not present

## 2021-12-11 DIAGNOSIS — F900 Attention-deficit hyperactivity disorder, predominantly inattentive type: Secondary | ICD-10-CM | POA: Diagnosis not present

## 2021-12-11 DIAGNOSIS — F411 Generalized anxiety disorder: Secondary | ICD-10-CM | POA: Diagnosis not present

## 2021-12-11 NOTE — Research (Signed)
Spoke with Amy Glass to schedule her for Week 1 Day  1 of essence research study. Scheduled for May 30 at 0930

## 2021-12-13 NOTE — Research (Signed)
She reports she takes  Amlodipine for High blood pressure started taking 04-27-20 Adderall  2x daily for ADHD started taking 09-08-18 Abilify every day for depression started taking 06-30-19 ASA daily to protect her heart started taking 2022 Lipitor daly for high cholesterol started taking 10-01-13 Pristiq daily for depression started taking 02-15-21 Flonase takes as needed for allergies started taking 12-21-15 Vascepa 2x daily for hight cholesterol started taking 05-28-18 Janumet daily for DM started taking 11-25-14 Cozaar for high cholesterol started taking 10-22-18 Mycostatin prn for intertrigo started taking 08-07-21 Pataday eye drops prn for itching eyes started taking 05-04-21 Prilosec daily for Reflux started taking 03-28-15 Ultracet daily for pain started taking3-13-15 Trazodone daily at bedtime for sleep started taking 10-08-14 Allegra for allergies started 05-04-21

## 2021-12-13 NOTE — Research (Signed)
[place] Qualification visit [phone]

## 2021-12-18 ENCOUNTER — Other Ambulatory Visit: Payer: Self-pay

## 2021-12-18 ENCOUNTER — Encounter: Payer: Medicare HMO | Admitting: *Deleted

## 2021-12-18 ENCOUNTER — Ambulatory Visit: Payer: Medicare HMO | Admitting: Podiatry

## 2021-12-18 VITALS — BP 127/76 | HR 77 | Temp 98.2°F | Resp 16 | Ht 59.0 in | Wt 143.0 lb

## 2021-12-18 DIAGNOSIS — F411 Generalized anxiety disorder: Secondary | ICD-10-CM | POA: Diagnosis not present

## 2021-12-18 DIAGNOSIS — F331 Major depressive disorder, recurrent, moderate: Secondary | ICD-10-CM | POA: Diagnosis not present

## 2021-12-18 DIAGNOSIS — Z006 Encounter for examination for normal comparison and control in clinical research program: Secondary | ICD-10-CM

## 2021-12-18 DIAGNOSIS — F900 Attention-deficit hyperactivity disorder, predominantly inattentive type: Secondary | ICD-10-CM | POA: Diagnosis not present

## 2021-12-18 MED ORDER — STUDY - ESSENCE - OLEZARSEN 50 MG, 80 MG OR PLACEBO SQ INJECTION (PI-HILTY)
80.0000 mg | INJECTION | Freq: Once | SUBCUTANEOUS | Status: DC
  Administered 2022-01-15: 80 mg via SUBCUTANEOUS
  Filled 2021-12-18: qty 0.8

## 2021-12-18 NOTE — Research (Addendum)
Amy Glass Week 1 Day 1 -Essence    TREATMENT DAY 1 - STUDY WEEK 1    Subject Number: S903           Randomization Number:[id number] 18-Dec-2021  [x] Vital Signs Collected - Blood Pressure: 127/76 - Height:4 ft 11 in - Weight:64.864 kg - Heart Rate:77 - Respiratory Rate:16 - Temperature:98.2 (36.8 C) - Oxygen Saturation: 99%  [x]  Physical Exam Completed by PI or SUB--by Dr Rennis Golden   [x]  12-lead ECG   [x]  Extended Urinalysis   [x]  Lab collection per protocol  [x]  Assessment of ER Visits, Hospitalizations, and Inpatient Days  [x]  Adverse Events and Concomitant Medications  [x]  Diet, Lifestyle, and Alcohol Counseling   [x]  Study Drug: [place] Injection   Amy Glass here for day 1 week 1 Essence visit. She reports no abd pain, no ED, or Urgent care visits. She does report a feeling of food getting stuck in her esophagitis. Dr Rennis Golden in to do exam at [phone], and encouraged her to follow up with her GI Dr. She voices understanding.  Vs taken at 0935, EKG at 0959, Urine obtained at 0950, and blood work at 1007. Injection given into right lower quad at 1037. Kit number [id number]  _________________________________________________________________________________________________ General appearance: alert and no distress Neck: no carotid bruit, no JVD, and thyroid not enlarged, symmetric, no tenderness/mass/nodules Lungs: clear to auscultation bilaterally Heart: regular rate and rhythm, S1, S2 normal, no murmur, click, rub or gallop Abdomen: soft, non-tender; bowel sounds normal; no masses,  no organomegaly Extremities: extremities normal, atraumatic, no cyanosis or edema Pulses: 2+ and symmetric Skin: Skin color, texture, turgor normal. No rashes or lesions Neurologic: Grossly normal Psych: Pleasant  No adverse exam findings today - denies chest pain, dyspnea or recent pancreatitis. Does have some solid food dysphagia - sees GI for this an has had a prior esophageal  dilitation.  Chrystie Nose, MD, [facility], FACP  [place]  [facility]  Medical Director of the Advanced Lipid Disorders &  Cardiovascular Risk Reduction Clinic Diplomate of the American Board of Clinical Lipidology Attending Cardiologist  Direct Dial: [phone]  Fax: [phone]  Website:  [email or web address]

## 2021-12-19 ENCOUNTER — Other Ambulatory Visit: Payer: Self-pay | Admitting: Internal Medicine

## 2021-12-19 DIAGNOSIS — I152 Hypertension secondary to endocrine disorders: Secondary | ICD-10-CM

## 2021-12-20 NOTE — Research (Addendum)
Amy Glass Week 1 day 1  18-Dec-2021  Amy Glass Essence-week 1 Day 1            ESSENCE Abnormal Lab report Month Day, Year   Chemistry:  Glucose  198   mg/dL                  '[]'$ Clinically Significant  '[x]'$ Not Clinically Significant Hemoglobin A1c 8.4 %                '[]'$ Clinically Significant  '[x]'$ Not Clinically Significant  Urinalysis: Blood  0.03 mg/dL                       '[]'$ Clinically Significant  '[x]'$ Not Clinically Significant Protein 20 mg/dL                        '[]'$ Clinically Significant  '[x]'$ Not Clinically Significant Leukocyte Esterase  500            '[]'$ Clinically Significant  '[x]'$ Not Clinically Significant Urinary White Blood cells 50-75 per HPF  '[]'$ Clinically Significant  '[x]'$ Not Clinically Significant Bacteria 1+                                  '[]'$ Clinically Significant  '[x]'$ Not Clinically Significant Calcium Oxalate Crystals present  '[]'$ Clinically Significant  '[x]'$ Not Clinically Significant Squamous Epithelial cells  6-9 per HPF  '[]'$ Clinically Significant  '[x]'$ Not Clinically Significant Mucous 2+                                      '[]'$ Clinically Significant  '[x]'$ Not Clinically Significant   Urine Chemistry: Urine Albumin  4.26 mg/dL               '[]'$ Clinically Significant  '[x]'$ Not Clinically Significant Albumin Creatinine Ratio 42 mg/g    '[]'$ Clinically Significant  '[x]'$ Not Clinically Significant  Lipids:  Triglyceride    466  mg/dL                 '[]'$ Clinically Significant  '[x]'$ Not Clinically Significant HDL-Cholesterol 27 mg/dL               '[]'$ Clinically Significant  '[x]'$ Not Clinically Significant Apolipoprotein Clll 27.88 mg/dL        '[]'$ Clinically Significant  '[x]'$ Not Clinically Significant   Any further action needed to be taken per the PI?  No  Urine appears contaminated- not clean cath.   Pixie Casino, MD, [facility], [place] Director of the Advanced Lipid Disorders &  Cardiovascular Risk Reduction Clinic Diplomate of the  American Board of Clinical Lipidology Attending Cardiologist  Direct Dial: [phone]  Fax: [phone]  Website:  [email or web address]

## 2021-12-21 NOTE — Research (Signed)
Dr Debara Pickett to review labs

## 2021-12-25 DIAGNOSIS — F900 Attention-deficit hyperactivity disorder, predominantly inattentive type: Secondary | ICD-10-CM | POA: Diagnosis not present

## 2021-12-25 DIAGNOSIS — F411 Generalized anxiety disorder: Secondary | ICD-10-CM | POA: Diagnosis not present

## 2021-12-25 DIAGNOSIS — F331 Major depressive disorder, recurrent, moderate: Secondary | ICD-10-CM | POA: Diagnosis not present

## 2021-12-26 DIAGNOSIS — E559 Vitamin D deficiency, unspecified: Secondary | ICD-10-CM | POA: Diagnosis not present

## 2021-12-26 DIAGNOSIS — I10 Essential (primary) hypertension: Secondary | ICD-10-CM | POA: Diagnosis not present

## 2021-12-26 DIAGNOSIS — Z6829 Body mass index (BMI) 29.0-29.9, adult: Secondary | ICD-10-CM | POA: Diagnosis not present

## 2021-12-26 DIAGNOSIS — Z79899 Other long term (current) drug therapy: Secondary | ICD-10-CM | POA: Diagnosis not present

## 2021-12-26 DIAGNOSIS — Z0130 Encounter for examination of blood pressure without abnormal findings: Secondary | ICD-10-CM | POA: Diagnosis not present

## 2021-12-26 DIAGNOSIS — F1120 Opioid dependence, uncomplicated: Secondary | ICD-10-CM | POA: Diagnosis not present

## 2021-12-26 DIAGNOSIS — E1165 Type 2 diabetes mellitus with hyperglycemia: Secondary | ICD-10-CM | POA: Diagnosis not present

## 2021-12-27 ENCOUNTER — Ambulatory Visit: Payer: Medicare HMO | Admitting: Podiatry

## 2021-12-27 DIAGNOSIS — F909 Attention-deficit hyperactivity disorder, unspecified type: Secondary | ICD-10-CM | POA: Diagnosis not present

## 2021-12-27 DIAGNOSIS — Z79899 Other long term (current) drug therapy: Secondary | ICD-10-CM | POA: Diagnosis not present

## 2022-01-01 DIAGNOSIS — Z79899 Other long term (current) drug therapy: Secondary | ICD-10-CM | POA: Diagnosis not present

## 2022-01-08 DIAGNOSIS — F411 Generalized anxiety disorder: Secondary | ICD-10-CM | POA: Diagnosis not present

## 2022-01-08 DIAGNOSIS — F900 Attention-deficit hyperactivity disorder, predominantly inattentive type: Secondary | ICD-10-CM | POA: Diagnosis not present

## 2022-01-08 DIAGNOSIS — F331 Major depressive disorder, recurrent, moderate: Secondary | ICD-10-CM | POA: Diagnosis not present

## 2022-01-12 ENCOUNTER — Other Ambulatory Visit: Payer: Self-pay | Admitting: Internal Medicine

## 2022-01-12 DIAGNOSIS — I10 Essential (primary) hypertension: Secondary | ICD-10-CM

## 2022-01-15 ENCOUNTER — Other Ambulatory Visit: Payer: Self-pay

## 2022-01-15 ENCOUNTER — Encounter: Payer: Medicare HMO | Admitting: *Deleted

## 2022-01-15 VITALS — BP 115/73 | HR 72 | Temp 98.0°F | Resp 16 | Wt 141.6 lb

## 2022-01-15 DIAGNOSIS — F331 Major depressive disorder, recurrent, moderate: Secondary | ICD-10-CM | POA: Diagnosis not present

## 2022-01-15 DIAGNOSIS — F411 Generalized anxiety disorder: Secondary | ICD-10-CM | POA: Diagnosis not present

## 2022-01-15 DIAGNOSIS — F900 Attention-deficit hyperactivity disorder, predominantly inattentive type: Secondary | ICD-10-CM | POA: Diagnosis not present

## 2022-01-15 DIAGNOSIS — Z006 Encounter for examination for normal comparison and control in clinical research program: Secondary | ICD-10-CM

## 2022-01-15 MED ORDER — STUDY - ESSENCE - OLEZARSEN 50 MG, 80 MG OR PLACEBO SQ INJECTION (PI-HILTY)
80.0000 mg | INJECTION | SUBCUTANEOUS | Status: DC
  Administered 2022-01-15: 80 mg via SUBCUTANEOUS
  Filled 2022-01-15: qty 0.8

## 2022-01-15 NOTE — Research (Addendum)
TREATMENT DAY 29 - STUDY WEEK 5 [facility]SS[facility]   Subject Number: S903             Randomization Number:[id number] Date 15-January-2022   [x] Vital Signs Collected - Blood Pressure: 115/73 - Weight:141.6 - Heart Rate:72 - Respiratory Rate:16 - Temperature:98.0 - Oxygen Saturation:99%   [x]  Extended Urinalysis   [x]  Lab collection per protocol  [x]  Assessment of ER Visits, Hospitalizations, and Inpatient Days  [x]  Adverse Events and Concomitant Medications  [x]  Diet, Lifestyle, and Alcohol Counseling   [x]  Study Drug: [place] Injection    Amy Glass here for Day 29 week 5. She reports no changes in her medications, no ED or Urgent care visits, and no abd pain. Vs taken at 1000, Blood work at 0945, Urine obtained at 0950. Injection given at 1015 into right lower abd.  Kit number [id number]. Next visit scheduled for July 25 at 0930. Current Outpatient Medications:    amLODipine (NORVASC) 10 MG tablet, TAKE 1 TABLET EVERY DAY, Disp: 90 tablet, Rfl: 1   amphetamine-dextroamphetamine (ADDERALL) 20 MG tablet, Take 20 mg by mouth 2 (two) times daily., Disp: , Rfl:    ARIPiprazole (ABILIFY) 2 MG tablet, Take 2 mg by mouth daily., Disp: , Rfl:    aspirin EC 81 MG tablet, Take 81 mg by mouth daily. Swallow whole., Disp: , Rfl:    atorvastatin (LIPITOR) 20 MG tablet, TAKE 1 TABLET EVERY DAY, Disp: 90 tablet, Rfl: 1   Blood Glucose Monitoring Suppl (ONETOUCH VERIO FLEX SYSTEM) w/Device KIT, Use as directed to test blood sugar once daily, Disp: 1 kit, Rfl: 0   desvenlafaxine (PRISTIQ) 50 MG 24 hr tablet, Take 50 mg by mouth daily., Disp: , Rfl:    fexofenadine (ALLEGRA) 180 MG tablet, Take 1 tablet (180 mg total) by mouth daily. (Patient taking differently: Take 180 mg by mouth daily as needed for allergies.), Disp: 90 tablet, Rfl: 1   glucose blood (ONETOUCH VERIO) test strip, Use as directed to test blood sugar once daily, Disp: 100 each, Rfl: 2   losartan (COZAAR) 100 MG tablet, TAKE 1 TABLET EVERY DAY,  Disp: 90 tablet, Rfl: 1   Multiple Vitamins-Minerals (MULTIVITAMIN WITH MINERALS) tablet, Take 1 tablet by mouth daily., Disp: , Rfl:    nystatin cream (MYCOSTATIN), Apply 1 application topically 2 (two) times daily. (Patient taking differently: Apply 1 application  topically 2 (two) times daily as needed for dry skin.), Disp: 30 g, Rfl: 2   Olopatadine HCl (PATADAY) 0.2 % SOLN, Place 1 drop into both eyes 1 day or 1 dose. (Patient taking differently: Place 1 drop into both eyes daily as needed (allergies).), Disp: 2.5 mL, Rfl: 5   omeprazole (PRILOSEC) 40 MG capsule, Take 1 capsule (40 mg total) by mouth daily. MUST KEEP 11/06/21 OFFICE VISIT FOR FURTHER REFILLS, Disp: 90 capsule, Rfl: 0   OneTouch Delica Lancets 33G MISC, Use as directed to test blood sugar once daily, Disp: 100 each, Rfl: 2   sitaGLIPtin-metformin (JANUMET) 50-1000 MG tablet, Take 1 tablet by mouth 2 (two) times daily with a meal., Disp: 180 tablet, Rfl: 4   Study - ESSENCE - olezarsen 50 mg, 80 mg or placebo SQ injection (PI-Hilty), Inject 80 mg into the skin every 28 (twenty-eight) days. For Investigational Use Only. Injection subcutaneously in protocol approved injection sites (abdomen, thigh or outer area of upper arm) every 4 weeks in clinic. Please contact  [facility] for any questions or concerns regarding this medication., Disp: ,  Rfl:    traMADol-acetaminophen (ULTRACET) 37.5-325 MG tablet, Take 1 tablet by mouth every 6 (six) hours as needed for severe pain or moderate pain., Disp: , Rfl:    traZODone (DESYREL) 100 MG tablet, Take 1 tablet (100 mg total) by mouth at bedtime as needed for sleep. (Patient taking differently: Take 100 mg by mouth at bedtime.), Disp: 90 tablet, Rfl: 2   VASCEPA 1 g capsule, TAKE 2 CAPSULES TWICE DAILY, Disp: 360 capsule, Rfl: 1  Current Facility-Administered Medications:    Study - ESSENCE - olezarsen 50 mg, 80 mg or placebo SQ injection (PI-Hilty), 80 mg,  Subcutaneous, Q28 days, Hilty, Lisette Abu, MD

## 2022-01-16 ENCOUNTER — Ambulatory Visit (INDEPENDENT_AMBULATORY_CARE_PROVIDER_SITE_OTHER): Payer: Medicare HMO | Admitting: Podiatry

## 2022-01-16 ENCOUNTER — Ambulatory Visit (INDEPENDENT_AMBULATORY_CARE_PROVIDER_SITE_OTHER): Payer: Medicare HMO

## 2022-01-16 DIAGNOSIS — M25572 Pain in left ankle and joints of left foot: Secondary | ICD-10-CM | POA: Diagnosis not present

## 2022-01-16 NOTE — Patient Instructions (Signed)
Brooks running shoes and Hoka running shoes typically are comfortable and supportive. I wear the Hoka Bondi 8 shoe

## 2022-01-17 NOTE — Research (Addendum)
Amy Glass Week 5 day 29 [facility] 15-January-2022         Chemistry:  Glucose 169   mg/dL                  '[]'$ Clinically Significant  '[x]'$ Not Clinically Significant  Urinalysis: Protein 10 mg/dL                 '[]'$ Clinically Significant  '[x]'$ Not Clinically Significant Leukocyte Esterase 500      '[]'$ Clinically Significant  '[x]'$ Not Clinically Significant Urinary White Blood cells 50-75 per HPF  '[]'$ Clinically Significant  '[x]'$ Not Clinically  Significant  Squamous Epithelial Cells 6-9       '[]'$ Clinically Significant  '[x]'$ Not Clinically Significant Transitional Epithelial cells Occ     '[]'$ Clinically Significant  '[x]'$ Not Clinically Significant Mucus 1+ per HPF                         '[]'$ Clinically Significant  '[x]'$ Not Clinically Significant  Urine Chemistry: Albumin Creatine Ratio  32  mg/g          '[]'$ Clinically Significant  '[x]'$ Not Clinically Significant Urine Albumin 3.01 mg/dL                '[]'$ Clinically Significant  '[x]'$ Not Clinically Significant     Any further action needed to be taken per the PI?  No  Pixie Casino, MD, [facility], [place] Director of the Advanced Lipid Disorders &  Cardiovascular Risk Reduction Clinic Diplomate of the American Board of Clinical Lipidology Attending Cardiologist  Direct Dial: [phone]  Fax: [phone]  Website:  [email or web address]

## 2022-01-18 ENCOUNTER — Other Ambulatory Visit: Payer: Self-pay

## 2022-01-18 ENCOUNTER — Encounter (HOSPITAL_COMMUNITY): Payer: Self-pay | Admitting: Oral Surgery

## 2022-01-18 NOTE — Progress Notes (Signed)
Spoke with pt for pre-op call. Pt denies cardiac history. Pt has seen Dr. Debara Pickett for Hypertriglerides and follow up CTA. Cardiac clearance note in Epic on 09/17/21. Pt is a type 2 Diabetic. Last A1C was 8.6 on 11/13/21. She states her fasting blood sugar is usually around 120. Instructed pt to check her blood sugar Monday AM when she wakes up. If blood sugar is 70 or below, treat with 1/2 cup of clear juice (apple or cranberry) and recheck blood sugar 15 minutes after drinking juice. Instructed pt to let her nurse know the day of surgery if she had to treat a low blood sugar. Pt instructed not to take her Janumet day of surgery.   Shower instructions given to pt.

## 2022-01-19 NOTE — Progress Notes (Signed)
Subjective:  Patient ID: Amy Glass, female    DOB: Dec 03, 1960,  MRN: [id number]  Left ankle pain  61 y.o. female returns for follow-up with the above complaint. History confirmed with patient.  She has started working more so she is having more pain and throbbing Objective:  Physical Exam: warm, good capillary refill, no trophic changes or ulcerative lesions, normal DP and PT pulses, and normal sensory exam. Left Foot and ankle: She is incurvated borders of the medial and lateral border of the hallux, pain on palpation of the lateral ankle over the ATFL and CFL.  She has mild laxity of the CFL with inversion, no gross reproducible dislocation but it is painful with an anterior drawer and she has pain along the sinus tarsi and anterior joint line of the ankle.  She has sharp pain with palpation of the syndesmotic interval and compression of the distal leg Right Foot: Incurvated borders medial lateral border of the hallux, she has hallux valgus deformity with a bunion medially and hypermobility of the first ray  Radiographs: Multiple views x-ray of the right foot and left ankle: Left ankle distal fibula with small osteophyte likely sequela of prior trauma, there is minimal tib-fib overlap on the AP view; right foot shows moderate hallux valgus deformity with bunion medially and frontal plane valgus rotation  Study Result  Narrative & Impression  CLINICAL DATA:  Remote chronic injury the ankle, worsening recently.   EXAM: MRI OF THE LEFT ANKLE WITHOUT CONTRAST   TECHNIQUE: Multiplanar, multisequence MR imaging of the ankle was performed. No intravenous contrast was administered.   COMPARISON:  Radiographs 03/13/2021   FINDINGS: TENDONS   Peroneal: Longitudinal partial tearing of the peroneus brevis posterior to the lateral malleolus, with common peroneus tendon sheath tenosynovitis.   Posteromedial: Flexor hallucis longus tenosynovitis.   Anterior: Unremarkable    Achilles: Minimal linear accentuated signal in the distal Achilles tendon for example on image 16 series 4 compatible with mild tendinopathy.   Plantar Fascia: Mild thickening of the medial band of the plantar fascia with trace adjacent edema, potentially reflecting mild plantar fasciitis.   LIGAMENTS   Lateral: The lower syndesmosis seems intact on image 2 series 4. The inferior tibiofibular ligaments are likewise intact and accordingly we do not demonstrate compelling supportive findings for syndesmotic injury.   The talofibular ligaments and calcaneofibular ligament appear intact.   Medial: Mildly attenuated appearance of the tibionavicular portion of the deltoid ligament, potentially from prior injury. The tibiospring portion and deep tibiotalar portion appear intact. There is some mild thickening of the superomedial portion of the spring ligament.   CARTILAGE   Ankle Joint: Unremarkable   Subtalar Joints/Sinus Tarsi: There is some edema in the sinus tarsi although less than would typically be encountered in sinus tarsi syndrome. Some of this may represent a small effusion from the lateral subtalar joint.   Bones: Plantar calcaneal spur.   Other: No supplemental non-categorized findings.   IMPRESSION: 1. The inferior tibiofibular ligaments appear intact as does the lower margin of the visible syndesmosis, accordingly no specific findings further supportive of syndesmotic injury are shown on today's exam. 2. Longitudinal tearing of the peroneus brevis tendon posterior to the lateral malleolus, with adjacent common peroneus tendon sheath tenosynovitis. 3. Flexor hallucis longus tenosynovitis. 4. Suspected mild plantar fasciitis. 5. Mild distal Achilles tendinopathy. 6. Attenuated appearance of the tibionavicular portion of the deltoid ligament, potentially from prior injury.     Electronically Signed   By: Van Clines  M.D.   Assessment:   1. Sinus  tarsi syndrome, left      Plan:  Patient was evaluated and treated and all questions answered.    Most of her pain today was in the sinus tarsi.  I recommended injection therapy and continued bracing and therapy at home.  Following sterile prep with Betadine 5 mg of Kenalog and 2 mg of dexamethasone was injected to the left ankle with 1 cc of lidocaine.  She tolerated well.  Like to have her ingrown toenail removed and we will plan for this at the next visit.  Return in about 6 weeks (around 02/27/2022) for bilateral ingrown nail removal, follow up on injection .

## 2022-01-20 ENCOUNTER — Encounter (HOSPITAL_COMMUNITY): Payer: Self-pay | Admitting: Oral Surgery

## 2022-01-20 NOTE — Anesthesia Preprocedure Evaluation (Addendum)
Anesthesia Evaluation  Patient identified by MRN, date of birth, ID band Patient awake    Reviewed: Allergy & Precautions, NPO status , Patient's Chart, lab work & pertinent test results, reviewed documented beta blocker date and time   Airway Mallampati: IV  TM Distance: >3 FB Neck ROM: Limited  Mouth opening: Limited Mouth Opening  Dental  (+) Teeth Intact, Dental Advisory Given   Pulmonary neg pulmonary ROS,    Pulmonary exam normal breath sounds clear to auscultation       Cardiovascular hypertension, Pt. on medications Normal cardiovascular exam Rhythm:Regular Rate:Normal     Neuro/Psych PSYCHIATRIC DISORDERS Anxiety Depression Bipolar Disorder    GI/Hepatic Neg liver ROS, hiatal hernia, GERD  Medicated and Controlled,  Endo/Other  diabetes, Poorly Controlled, Type 2, Oral Hypoglycemic AgentsHyperlipidemia  Renal/GU negative Renal ROS  negative genitourinary   Musculoskeletal  (+) Arthritis , Osteoarthritis,  Bilateral TMJ dysfunction   Abdominal   Peds  Hematology  (+) REFUSES BLOOD PRODUCTS,   Anesthesia Other Findings   Reproductive/Obstetrics                            Anesthesia Physical Anesthesia Plan  ASA: 2  Anesthesia Plan: General   Post-op Pain Management: Tylenol PO (pre-op)*, Ketamine IV*, Dilaudid IV and Precedex   Induction: Intravenous  PONV Risk Score and Plan: 4 or greater and Treatment may vary due to age or medical condition, Midazolam, Ondansetron and Dexamethasone  Airway Management Planned: Oral ETT and Nasal ETT  Additional Equipment: None  Intra-op Plan:   Post-operative Plan: Extubation in OR  Informed Consent: I have reviewed the patients History and Physical, chart, labs and discussed the procedure including the risks, benefits and alternatives for the proposed anesthesia with the patient or authorized representative who has indicated his/her  understanding and acceptance.     Dental advisory given  Plan Discussed with: CRNA and Anesthesiologist  Anesthesia Plan Comments:        Anesthesia Quick Evaluation

## 2022-01-21 ENCOUNTER — Ambulatory Visit (HOSPITAL_COMMUNITY)
Admission: RE | Admit: 2022-01-21 | Discharge: 2022-01-21 | Disposition: A | Discharge status: Home / Self Care | Payer: Medicare HMO | Attending: Oral Surgery | Admitting: Oral Surgery

## 2022-01-21 ENCOUNTER — Ambulatory Visit (HOSPITAL_BASED_OUTPATIENT_CLINIC_OR_DEPARTMENT_OTHER): Payer: Medicare HMO | Admitting: Anesthesiology

## 2022-01-21 ENCOUNTER — Encounter (HOSPITAL_COMMUNITY): Payer: Self-pay | Admitting: Oral Surgery

## 2022-01-21 ENCOUNTER — Encounter (HOSPITAL_COMMUNITY)
Admission: RE | Disposition: A | Discharge status: Home / Self Care | Payer: Self-pay | Source: Home / Self Care | Attending: Oral Surgery

## 2022-01-21 ENCOUNTER — Ambulatory Visit (HOSPITAL_COMMUNITY): Payer: Medicare HMO | Admitting: Anesthesiology

## 2022-01-21 ENCOUNTER — Other Ambulatory Visit: Payer: Self-pay

## 2022-01-21 DIAGNOSIS — I10 Essential (primary) hypertension: Secondary | ICD-10-CM | POA: Insufficient documentation

## 2022-01-21 DIAGNOSIS — M2669 Other specified disorders of temporomandibular joint: Secondary | ICD-10-CM

## 2022-01-21 DIAGNOSIS — M2651 Abnormal jaw closure: Secondary | ICD-10-CM | POA: Diagnosis not present

## 2022-01-21 DIAGNOSIS — Z79899 Other long term (current) drug therapy: Secondary | ICD-10-CM | POA: Diagnosis not present

## 2022-01-21 DIAGNOSIS — E785 Hyperlipidemia, unspecified: Secondary | ICD-10-CM | POA: Insufficient documentation

## 2022-01-21 DIAGNOSIS — F419 Anxiety disorder, unspecified: Secondary | ICD-10-CM | POA: Diagnosis not present

## 2022-01-21 DIAGNOSIS — Z7984 Long term (current) use of oral hypoglycemic drugs: Secondary | ICD-10-CM | POA: Diagnosis not present

## 2022-01-21 DIAGNOSIS — F319 Bipolar disorder, unspecified: Secondary | ICD-10-CM | POA: Diagnosis not present

## 2022-01-21 DIAGNOSIS — M26603 Bilateral temporomandibular joint disorder, unspecified: Secondary | ICD-10-CM | POA: Diagnosis not present

## 2022-01-21 DIAGNOSIS — K219 Gastro-esophageal reflux disease without esophagitis: Secondary | ICD-10-CM | POA: Diagnosis not present

## 2022-01-21 DIAGNOSIS — M26631 Articular disc disorder of right temporomandibular joint: Secondary | ICD-10-CM | POA: Diagnosis not present

## 2022-01-21 DIAGNOSIS — M26629 Arthralgia of temporomandibular joint, unspecified side: Secondary | ICD-10-CM

## 2022-01-21 DIAGNOSIS — R69 Illness, unspecified: Secondary | ICD-10-CM | POA: Diagnosis not present

## 2022-01-21 DIAGNOSIS — E1165 Type 2 diabetes mellitus with hyperglycemia: Secondary | ICD-10-CM | POA: Diagnosis not present

## 2022-01-21 HISTORY — PX: TMJ ARTHROPLASTY: SHX1066

## 2022-01-21 LAB — CBC WITH DIFFERENTIAL/PLATELET
Abs Immature Granulocytes: 0.02 10*3/uL (ref 0.00–0.07)
Basophils Absolute: 0.1 10*3/uL (ref 0.0–0.1)
Basophils Relative: 1 %
Eosinophils Absolute: 0.2 10*3/uL (ref 0.0–0.5)
Eosinophils Relative: 3 %
HCT: 38.1 % (ref 36.0–46.0)
Hemoglobin: 12.6 g/dL (ref 12.0–15.0)
Immature Granulocytes: 0 %
Lymphocytes Relative: 46 %
Lymphs Abs: 2.7 10*3/uL (ref 0.7–4.0)
MCH: 28.6 pg (ref 26.0–34.0)
MCHC: 33.1 g/dL (ref 30.0–36.0)
MCV: 86.6 fL (ref 80.0–100.0)
Monocytes Absolute: 0.4 10*3/uL (ref 0.1–1.0)
Monocytes Relative: 7 %
Neutro Abs: 2.5 10*3/uL (ref 1.7–7.7)
Neutrophils Relative %: 43 %
Platelets: 219 10*3/uL (ref 150–400)
RBC: 4.4 MIL/uL (ref 3.87–5.11)
RDW: 13.2 % (ref 11.5–15.5)
WBC: 5.9 10*3/uL (ref 4.0–10.5)
nRBC: 0 % (ref 0.0–0.2)

## 2022-01-21 LAB — BASIC METABOLIC PANEL
Anion gap: 8 (ref 5–15)
BUN: 11 mg/dL (ref 6–20)
CO2: 20 mmol/L — ABNORMAL LOW (ref 22–32)
Calcium: 8.7 mg/dL — ABNORMAL LOW (ref 8.9–10.3)
Chloride: 107 mmol/L (ref 98–111)
Creatinine, Ser: 0.58 mg/dL (ref 0.44–1.00)
GFR, Estimated: >60 mL/min (ref >60)
Glucose, Bld: 229 mg/dL — ABNORMAL HIGH (ref 70–99)
Potassium: 4 mmol/L (ref 3.5–5.1)
Sodium: 135 mmol/L (ref 135–145)

## 2022-01-21 LAB — NO BLOOD PRODUCTS

## 2022-01-21 LAB — GLUCOSE, CAPILLARY
Glucose-Capillary: 222 mg/dL — ABNORMAL HIGH (ref 70–99)
Glucose-Capillary: 191 mg/dL — ABNORMAL HIGH (ref 70–99)
Glucose-Capillary: 242 mg/dL — ABNORMAL HIGH (ref 70–99)

## 2022-01-21 SURGERY — ARTHROPLASTY, TMJ
Anesthesia: General | Site: Face | Laterality: Bilateral

## 2022-01-21 MED ORDER — OXYCODONE-ACETAMINOPHEN 5-325 MG PO TABS: 1.0000 | ORAL_TABLET | ORAL | 0 refills | Status: DC | PRN

## 2022-01-21 MED ORDER — FENTANYL CITRATE (PF) 250 MCG/5ML IJ SOLN
INTRAMUSCULAR | Status: AC
  Filled 2022-01-21: qty 5

## 2022-01-21 MED ORDER — PHENYLEPHRINE HCL (PRESSORS) 10 MG/ML IV SOLN
INTRAVENOUS | Status: DC | PRN
  Administered 2022-01-21 (×3): 80 ug via INTRAVENOUS

## 2022-01-21 MED ORDER — LIDOCAINE-EPINEPHRINE 2 %-1:100000 IJ SOLN
INTRAMUSCULAR | Status: AC
Start: 2022-01-21 — End: ?
  Filled 2022-01-21: qty 1

## 2022-01-21 MED ORDER — LIDOCAINE 2% (20 MG/ML) 5 ML SYRINGE
INTRAMUSCULAR | Status: AC
  Filled 2022-01-21: qty 5

## 2022-01-21 MED ORDER — BUPIVACAINE-EPINEPHRINE 0.5% -1:200000 IJ SOLN
INTRAMUSCULAR | Status: AC
  Filled 2022-01-21: qty 1

## 2022-01-21 MED ORDER — OXYMETAZOLINE HCL 0.05 % NA SOLN
NASAL | Status: DC | PRN
  Administered 2022-01-21: 2 via NASAL

## 2022-01-21 MED ORDER — HYDROMORPHONE HCL 1 MG/ML IJ SOLN
INTRAMUSCULAR | Status: AC
  Filled 2022-01-21: qty 0.5

## 2022-01-21 MED ORDER — ORAL CARE MOUTH RINSE: 15.0000 mL | Freq: Once | OROMUCOSAL | Status: AC

## 2022-01-21 MED ORDER — LIDOCAINE HCL (CARDIAC) PF 100 MG/5ML IV SOSY
PREFILLED_SYRINGE | INTRAVENOUS | Status: DC | PRN
  Administered 2022-01-21: 60 mg via INTRAVENOUS

## 2022-01-21 MED ORDER — FENTANYL CITRATE (PF) 100 MCG/2ML IJ SOLN
INTRAMUSCULAR | Status: DC | PRN
  Administered 2022-01-21 (×3): 50 ug via INTRAVENOUS
  Administered 2022-01-21: 100 ug via INTRAVENOUS

## 2022-01-21 MED ORDER — CEFAZOLIN SODIUM-DEXTROSE 2-4 GM/100ML-% IV SOLN
2.0000 g | INTRAVENOUS | Status: AC
  Administered 2022-01-21: 2 g via INTRAVENOUS
  Filled 2022-01-21: qty 100

## 2022-01-21 MED ORDER — EPHEDRINE SULFATE (PRESSORS) 50 MG/ML IJ SOLN
INTRAMUSCULAR | Status: DC | PRN
  Administered 2022-01-21: 5 mg via INTRAVENOUS
  Administered 2022-01-21 (×2): 10 mg via INTRAVENOUS

## 2022-01-21 MED ORDER — HYDROMORPHONE HCL 1 MG/ML IJ SOLN
INTRAMUSCULAR | Status: DC | PRN
  Administered 2022-01-21 (×2): .5 mg via INTRAVENOUS

## 2022-01-21 MED ORDER — LACTATED RINGERS IV SOLN: INTRAVENOUS | Status: DC

## 2022-01-21 MED ORDER — SODIUM CHLORIDE 0.9 % IR SOLN
Status: DC | PRN
  Administered 2022-01-21: 1000 mL

## 2022-01-21 MED ORDER — 0.9 % SODIUM CHLORIDE (POUR BTL) OPTIME
TOPICAL | Status: DC | PRN
  Administered 2022-01-21: 1000 mL

## 2022-01-21 MED ORDER — MIDAZOLAM HCL 2 MG/2ML IJ SOLN
INTRAMUSCULAR | Status: AC
  Filled 2022-01-21: qty 2

## 2022-01-21 MED ORDER — EPHEDRINE 5 MG/ML INJ
INTRAVENOUS | Status: AC
  Filled 2022-01-21: qty 5

## 2022-01-21 MED ORDER — PROPOFOL 10 MG/ML IV BOLUS
INTRAVENOUS | Status: DC | PRN
  Administered 2022-01-21: 140 mg via INTRAVENOUS

## 2022-01-21 MED ORDER — PHENYLEPHRINE 80 MCG/ML (10ML) SYRINGE FOR IV PUSH (FOR BLOOD PRESSURE SUPPORT)
PREFILLED_SYRINGE | INTRAVENOUS | Status: AC
  Filled 2022-01-21: qty 10

## 2022-01-21 MED ORDER — SCOPOLAMINE 1 MG/3DAYS TD PT72
1.0000 | MEDICATED_PATCH | TRANSDERMAL | Status: DC
Start: 2022-01-21 — End: 2022-01-23
  Administered 2022-01-21: 1.5 mg via TRANSDERMAL
  Filled 2022-01-21: qty 1

## 2022-01-21 MED ORDER — CHLORHEXIDINE GLUCONATE 0.12 % MT SOLN
15.0000 mL | Freq: Once | OROMUCOSAL | Status: AC
  Administered 2022-01-21: 15 mL via OROMUCOSAL
  Filled 2022-01-21: qty 15

## 2022-01-21 MED ORDER — SUGAMMADEX SODIUM 200 MG/2ML IV SOLN
INTRAVENOUS | Status: DC | PRN
  Administered 2022-01-21: 127 mg via INTRAVENOUS

## 2022-01-21 MED ORDER — BACITRACIN ZINC 500 UNIT/GM EX OINT
TOPICAL_OINTMENT | CUTANEOUS | Status: DC | PRN
  Administered 2022-01-21: 1 via TOPICAL

## 2022-01-21 MED ORDER — LIDOCAINE-EPINEPHRINE 2 %-1:100000 IJ SOLN
INTRAMUSCULAR | Status: DC | PRN
  Administered 2022-01-21: 5 mL via INTRADERMAL
  Administered 2022-01-21: 8 mL via INTRADERMAL
  Administered 2022-01-21: 7 mL via INTRADERMAL

## 2022-01-21 MED ORDER — CEPHALEXIN 500 MG PO CAPS: 500.0000 mg | ORAL_CAPSULE | Freq: Two times a day (BID) | ORAL | 0 refills | Status: AC

## 2022-01-21 MED ORDER — DEXMEDETOMIDINE (PRECEDEX) IN NS 20 MCG/5ML (4 MCG/ML) IV SYRINGE
PREFILLED_SYRINGE | INTRAVENOUS | Status: DC | PRN
  Administered 2022-01-21 (×3): 4 ug via INTRAVENOUS

## 2022-01-21 MED ORDER — BACITRACIN ZINC 500 UNIT/GM EX OINT
TOPICAL_OINTMENT | CUTANEOUS | Status: AC
  Filled 2022-01-21: qty 28.35

## 2022-01-21 MED ORDER — ONDANSETRON HCL 4 MG/2ML IJ SOLN
INTRAMUSCULAR | Status: DC | PRN
  Administered 2022-01-21: 4 mg via INTRAVENOUS

## 2022-01-21 MED ORDER — ROCURONIUM BROMIDE 100 MG/10ML IV SOLN
INTRAVENOUS | Status: DC | PRN
  Administered 2022-01-21: 60 mg via INTRAVENOUS

## 2022-01-21 MED ORDER — DEXAMETHASONE SODIUM PHOSPHATE 4 MG/ML IJ SOLN
INTRAMUSCULAR | Status: DC | PRN
  Administered 2022-01-21: 4 mg via INTRAVENOUS

## 2022-01-21 MED ORDER — LABETALOL HCL 5 MG/ML IV SOLN
INTRAVENOUS | Status: DC | PRN
  Administered 2022-01-21 (×2): 5 mg via INTRAVENOUS

## 2022-01-21 MED ORDER — MIDAZOLAM HCL 5 MG/5ML IJ SOLN
INTRAMUSCULAR | Status: DC | PRN
  Administered 2022-01-21: 2 mg via INTRAVENOUS

## 2022-01-21 MED ORDER — PROPOFOL 10 MG/ML IV BOLUS
INTRAVENOUS | Status: AC
  Filled 2022-01-21: qty 20

## 2022-01-21 MED ORDER — ONDANSETRON HCL 4 MG/2ML IJ SOLN: 4.0000 mg | Freq: Once | INTRAMUSCULAR | Status: DC | PRN

## 2022-01-21 MED ORDER — BUPIVACAINE-EPINEPHRINE 0.5% -1:200000 IJ SOLN
INTRAMUSCULAR | Status: DC | PRN
  Administered 2022-01-21: 10 mL
  Administered 2022-01-21: 12 mL

## 2022-01-21 MED ORDER — ONDANSETRON HCL 4 MG/2ML IJ SOLN
INTRAMUSCULAR | Status: AC
  Filled 2022-01-21: qty 2

## 2022-01-21 MED ORDER — AMISULPRIDE (ANTIEMETIC) 5 MG/2ML IV SOLN: 10.0000 mg | Freq: Once | INTRAVENOUS | Status: DC | PRN

## 2022-01-21 MED ORDER — HYDROMORPHONE HCL 1 MG/ML IJ SOLN: 0.2500 mg | INTRAMUSCULAR | Status: DC | PRN

## 2022-01-21 MED ORDER — ROCURONIUM BROMIDE 10 MG/ML (PF) SYRINGE
PREFILLED_SYRINGE | INTRAVENOUS | Status: AC
  Filled 2022-01-21: qty 10

## 2022-01-21 MED ORDER — INSULIN ASPART 100 UNIT/ML IJ SOLN
0.0000 [IU] | INTRAMUSCULAR | Status: AC | PRN
  Administered 2022-01-21: 4 [IU] via SUBCUTANEOUS
  Administered 2022-01-21: 6 [IU] via SUBCUTANEOUS
  Filled 2022-01-21: qty 1

## 2022-01-21 MED ORDER — DEXMEDETOMIDINE HCL IN NACL 80 MCG/20ML IV SOLN
INTRAVENOUS | Status: AC
  Filled 2022-01-21: qty 20

## 2022-01-21 SURGICAL SUPPLY — 90 items
APPLICATOR COTTON TIP 6 STRL (MISCELLANEOUS) IMPLANT
APPLICATOR COTTON TIP 6IN STRL (MISCELLANEOUS) ×4 IMPLANT
ATTRACTOMAT 16X20 MAGNETIC DRP (DRAPES) ×1 IMPLANT
BAG COUNTER SPONGE SURGICOUNT (BAG) ×4 IMPLANT
BENZOIN TINCTURE PRP APPL 2/3 (GAUZE/BANDAGES/DRESSINGS) ×1 IMPLANT
BLADE CLIPPER SURG (BLADE) ×2 IMPLANT
BLADE MINI 60D BLUE (BLADE) ×2 IMPLANT
BLADE RECIPRO TAPERED (BLADE) IMPLANT
BLADE SCLERAL 3.0 60 BEV DOWN (BLADE) ×2 IMPLANT
BLADE SURG 10 STRL SS (BLADE) ×1 IMPLANT
BLADE SURG 15 STRL LF DISP TIS (BLADE) IMPLANT
BLADE SURG 15 STRL SS (BLADE) ×1
BNDG ELASTIC 4X5.8 VLCR STR LF (GAUZE/BANDAGES/DRESSINGS) ×2 IMPLANT
BNDG GAUZE ELAST 4 BULKY (GAUZE/BANDAGES/DRESSINGS) ×2 IMPLANT
BUR CROSS CUT (BURR)
BUR CROSS CUT FISSURE 1.6 (BURR) IMPLANT
BUR EGG ELITE 4.0 (BURR) IMPLANT
BUR SABER DIAMOND 2.5 (BURR) IMPLANT
BUR SABER DIAMOND 3.0 (BURR) IMPLANT
BUR SABER RD CUTTING 3.0 (BURR) IMPLANT
BUR SRG MED 1.6XXCUT FSSR (BURR) IMPLANT
BUR SURG 4X8 MED (BURR) IMPLANT
BURR SRG MED 1.6XXCUT FSSR (BURR)
BURR SURG 4X8 MED (BURR)
CANISTER SUCT 3000ML PPV (MISCELLANEOUS) ×2 IMPLANT
CLEANER TIP ELECTROSURG 2X2 (MISCELLANEOUS) ×2 IMPLANT
CLSR STERI-STRIP ANTIMIC 1/2X4 (GAUZE/BANDAGES/DRESSINGS) ×2 IMPLANT
CNTNR URN SCR LID CUP LEK RST (MISCELLANEOUS) IMPLANT
CONT SPEC 4OZ STRL OR WHT (MISCELLANEOUS) ×2
COTTONBALL LRG STERILE PKG (GAUZE/BANDAGES/DRESSINGS) ×2 IMPLANT
COVER SURGICAL LIGHT HANDLE (MISCELLANEOUS) ×2 IMPLANT
DRAPE EENT ADH APERT 15X15 STR (DRAPES) ×2 IMPLANT
DRAPE INCISE IOBAN 66X45 STRL (DRAPES) ×1 IMPLANT
DRAPE SURG 17X23 STRL (DRAPES) IMPLANT
DRSG TELFA 3X8 NADH (GAUZE/BANDAGES/DRESSINGS) ×2 IMPLANT
ELECT COATED BLADE 2.86 ST (ELECTRODE) ×2 IMPLANT
ELECT NDL TIP 2.8 STRL (NEEDLE) IMPLANT
ELECT NEEDLE TIP 2.8 STRL (NEEDLE) ×2 IMPLANT
ELECT REM PT RETURN 9FT ADLT (ELECTROSURGICAL) ×2
ELECTRODE REM PT RTRN 9FT ADLT (ELECTROSURGICAL) ×1 IMPLANT
GAUZE 4X4 16PLY ~~LOC~~+RFID DBL (SPONGE) ×2 IMPLANT
GAUZE SPONGE 4X4 12PLY STRL (GAUZE/BANDAGES/DRESSINGS) ×3 IMPLANT
GLOVE BIO SURGEON STRL SZ 6.5 (GLOVE) ×2 IMPLANT
GLOVE BIO SURGEON STRL SZ8 (GLOVE) ×2 IMPLANT
GLOVE BIOGEL PI IND STRL 7.0 (GLOVE) ×1 IMPLANT
GLOVE BIOGEL PI INDICATOR 7.0 (GLOVE) ×1
GOWN STRL REUS W/ TWL LRG LVL3 (GOWN DISPOSABLE) ×2 IMPLANT
GOWN STRL REUS W/ TWL XL LVL3 (GOWN DISPOSABLE) ×1 IMPLANT
GOWN STRL REUS W/TWL LRG LVL3 (GOWN DISPOSABLE) ×2
GOWN STRL REUS W/TWL XL LVL3 (GOWN DISPOSABLE) ×1
KIT BASIN OR (CUSTOM PROCEDURE TRAY) ×2 IMPLANT
KIT TURNOVER KIT B (KITS) ×2 IMPLANT
LOCATOR NERVE 3 VOLT (DISPOSABLE) IMPLANT
NDL BLUNT 16X1.5 OR ONLY (NEEDLE) IMPLANT
NEEDLE 22X1 1/2 (OR ONLY) (NEEDLE) ×4 IMPLANT
NEEDLE BLUNT 16X1.5 OR ONLY (NEEDLE) IMPLANT
NS IRRIG 1000ML POUR BTL (IV SOLUTION) ×2 IMPLANT
PAD ARMBOARD 7.5X6 YLW CONV (MISCELLANEOUS) ×4 IMPLANT
PAD DRESSING TELFA 3X8 NADH (GAUZE/BANDAGES/DRESSINGS) ×1 IMPLANT
PENCIL BUTTON HOLSTER BLD 10FT (ELECTRODE) ×2 IMPLANT
POSITIONER HEAD DONUT 9IN (MISCELLANEOUS) ×2 IMPLANT
RASP HELIOCORDIAL MED (MISCELLANEOUS) IMPLANT
SLEEVE IRRIGATION ELITE 7 (MISCELLANEOUS) ×2 IMPLANT
SUT CHROMIC 3 0 PS 2 (SUTURE) ×2 IMPLANT
SUT CHROMIC 4 0 P 3 18 (SUTURE) IMPLANT
SUT ETHILON 3 0 PS 1 (SUTURE) ×1 IMPLANT
SUT ETHILON 4 0 PS 2 18 (SUTURE) IMPLANT
SUT ETHILON 5 0 P 3 18 (SUTURE)
SUT MERSILENE 4 0 P 3 (SUTURE) IMPLANT
SUT MNCRL AB 4-0 PS2 18 (SUTURE) ×2 IMPLANT
SUT NYLON ETHILON 5-0 P-3 1X18 (SUTURE) IMPLANT
SUT PROLENE 5 0 P 3 (SUTURE) ×3 IMPLANT
SUT PROLENE 5 0 PS 2 (SUTURE) IMPLANT
SUT SILK 2 0 (SUTURE) ×1
SUT SILK 2-0 18XBRD TIE 12 (SUTURE) IMPLANT
SUT SILK 3 0 (SUTURE)
SUT SILK 3-0 18XBRD TIE 12 (SUTURE) IMPLANT
SUT VIC AB 2-0 CT1 (SUTURE) IMPLANT
SUT VIC AB 3-0 CT1 27 (SUTURE)
SUT VIC AB 3-0 CT1 TAPERPNT 27 (SUTURE) IMPLANT
SUT VIC AB 3-0 PS2 18 (SUTURE) ×1 IMPLANT
SUT VIC AB 4-0 PC1 18 (SUTURE) ×1 IMPLANT
SUT VIC AB 4-0 PS2 27 (SUTURE) IMPLANT
SYR 50ML SLIP (SYRINGE) IMPLANT
SYR CONTROL 10ML LL (SYRINGE) ×3 IMPLANT
TOWEL GREEN STERILE (TOWEL DISPOSABLE) ×2 IMPLANT
TRAY ENT MC OR (CUSTOM PROCEDURE TRAY) ×2 IMPLANT
TUBE CONNECTING 12X1/4 (SUCTIONS) ×2 IMPLANT
WATER STERILE IRR 1000ML POUR (IV SOLUTION) ×2 IMPLANT
YANKAUER SUCT BULB TIP NO VENT (SUCTIONS) ×2 IMPLANT

## 2022-01-21 NOTE — Anesthesia Postprocedure Evaluation (Signed)
Anesthesia Post Note  Patient: Amy Glass  Procedure(s) Performed: TEMPOROMANDIBULAR JOINT (TMJ) ARTHROTOMY, MINISECTOMY WITH ABDOMINAL FAT GRAFT (Bilateral: Face)     Patient location during evaluation: PACU Anesthesia Type: General Level of consciousness: awake and alert and oriented Pain management: pain level controlled Vital Signs Assessment: post-procedure vital signs reviewed and stable Respiratory status: spontaneous breathing, nonlabored ventilation and respiratory function stable Cardiovascular status: blood pressure returned to baseline and stable Postop Assessment: no apparent nausea or vomiting Anesthetic complications: no   No notable events documented.  Last Vitals:  Vitals:   01/21/22 1050 01/21/22 1105  BP: 117/76 (!) 143/86  Pulse: 86 87  Resp: 12 16  Temp:    SpO2: 93% 98%    Last Pain:  Vitals:   01/21/22 1050  TempSrc:   PainSc: 0-No pain                 Kemarion Abbey A.

## 2022-01-21 NOTE — Transfer of Care (Signed)
Immediate Anesthesia Transfer of Care Note  Patient: Amy Glass  Procedure(s) Performed: TEMPOROMANDIBULAR JOINT (TMJ) ARTHROTOMY, MINISECTOMY WITH ABDOMINAL FAT GRAFT (Bilateral: Face)  Patient Location: PACU  Anesthesia Type:General  Level of Consciousness: drowsy  Airway & Oxygen Therapy: Patient Spontanous Breathing and Patient connected to face mask oxygen  Post-op Assessment: Report given to RN and Post -op Vital signs reviewed and stable  Post vital signs: Reviewed and stable  Last Vitals:  Vitals Value Taken Time  BP 113/73 01/21/22 1020  Temp 36.9 C 01/21/22 1020  Pulse 78 01/21/22 1026  Resp 10 01/21/22 1026  SpO2 94 % 01/21/22 1026  Vitals shown include unvalidated device data.  Last Pain:  Vitals:   01/21/22 1020  TempSrc:   PainSc: Asleep         Complications: No notable events documented.

## 2022-01-21 NOTE — Anesthesia Procedure Notes (Signed)
Procedure Name: Intubation Date/Time: 01/21/2022 7:30 AM  Performed by: Ezequiel Kayser, CRNAPre-anesthesia Checklist: Patient identified, Emergency Drugs available, Suction available and Patient being monitored Patient Re-evaluated:Patient Re-evaluated prior to induction Oxygen Delivery Method: Circle System Utilized Preoxygenation: Pre-oxygenation with 100% oxygen Induction Type: IV induction Ventilation: Mask ventilation without difficulty Laryngoscope Size: Glidescope and 3 Grade View: Grade I Nasal Tubes: Nasal Rae, Nasal prep performed and Right Tube size: 6.5 mm Number of attempts: 1 Placement Confirmation: ETT inserted through vocal cords under direct vision, positive ETCO2 and breath sounds checked- equal and bilateral Secured at: 25 cm Tube secured with: Tape Dental Injury: Teeth and Oropharynx as per pre-operative assessment

## 2022-01-21 NOTE — Op Note (Signed)
01/21/2022  10:10 AM  PATIENT:  Colin Benton Mcjunkins  61 y.o. female  PRE-OPERATIVE DIAGNOSIS:  BILATERAL TMJ INTERNAL DERANGEMENT,   POST-OPERATIVE DIAGNOSIS:  SAME  PROCEDURE:  Procedure(s): BILATERAL TEMPOROMANDIBULAR JOINT (TMJ) ARTHROTOMY, MINISECTOMY WITH ABDOMINAL FAT GRAFT  SURGEON:  Surgeon(s): Diona Browner, DMD  ANESTHESIA:   local and general  EBL:  250CC  DRAINS: none   SPECIMEN:  RIGHT AND LEFT TMJ DISC  COUNTS:  YES  PLAN OF CARE: Discharge to home after PACU  PATIENT DISPOSITION:  PACU - hemodynamically stable.   PROCEDURE DETAILS: Dictation # [id number]  Gae Bon, DMD 01/21/2022 10:10 AM

## 2022-01-21 NOTE — Op Note (Signed)
Amy Glass, Amy Glass MEDICAL RECORD NO: [id number] ACCOUNT NO: [id number] DATE OF BIRTH: Jan 31, 1961 FACILITY: [facility] LOCATION: [facility] PHYSICIAN: Gae Bon, DDS  Operative Report   DATE OF PROCEDURE: 01/21/2022  PREOPERATIVE DIAGNOSIS:  Bilateral TMJ internal derangement.  POSTOPERATIVE DIAGNOSIS:  Bilateral TMJ internal derangement.  PROCEDURES:  Bilateral temporomandibular joint arthrotomy, meniscectomy, harvest abdominal fat graft, placement fat graft into the right and left TMJ capsule.  SURGEON:  Gae Bon, DDS  ANESTHESIA:  General, nasal intubation, Dr. Royce Macadamia attending.  DESCRIPTION OF PROCEDURE:  The patient was taken to the operating room and placed on the table in supine position.  General anesthesia was administered and nasal endotracheal tube was placed.  The eyes were protected and the patient was shaved  bilaterally in the temporal regions and then was prepped and draped.  Timeout was performed.  The abdominal fat graft harvest was begun first.  A 15 blade was used to make a 3 cm incision in a preexisting skin fold, lateral to the right side of the  umbilicus.  The local anesthesia was administered and then the skin incision was made with #10 blade.  Dissection was carried down to the fatty tissue and then using hemostats and pickups and scissors, fat graft was harvested.  Bleeding areas were  cauterized with electrocautery.  Then, the area was closed with a 3-0 chromic and 4-0 nylon.  A sterile towel was placed over the abdomen and the right TMJ was operated.  A sterile marking pen was used to demarcate a preauricular incision beginning at  the uppermost part of the ear extending to the pinna. The superior aspect of the incision was carried superiorly approximately 1 cm with a curvature anteriorly.   Then, local anesthesia was administered 2% lidocaine, 1:100,000 epinephrine and then the 15 blade was used to make an incision along the previously demarcate incision   line through skin and subcutaneous tissue.  Bleeding vessels were cauterized with the Bovie electrocautery.  The incision was dissected superiorly until the superficial layer of temporal fascia was reached.  This was used as a plane of dissection  inferiorly throughout the entire incision and then an oblique incision was made from the root of the zygoma next to the ear 45 degrees angling superiorly towards the canthus of the eye, approximately 2.5 cm long.  This was down into the deep temporal  fascia region.  Then, dissection was carried out with the periosteal elevator through this angular incision until the zygoma was palpated and freed with the periosteal elevator.  Then, this was connected to the vertical leg of the incision from the  tragus to the pinna.  Dissection was carried forward along the zygomatic arch until the condyle and capsule was identified.  Bleeding vessels during the dissection were cauterized with electrocautery and several vessels were ligated with 2-0 silk ties.   The tissue of the capsule was dissected until the condylar neck could be seen, and then the capsule was incised with a 15 blade.  A  bone forceps was used percutaneously to grab the inferior border of the mandible to provide an inferior distraction once the capsule was identified. Superior joint space was identified.  The disc was in fragments with a large tear in the center. Then, the 15 blade was used  to make an incision in the lateral recess and dissection of the disc was carried out using the Soil scientist.  Then, the disc was grasped with pickups and removed with scissors and [place]ena Nunnery  blades.  Then, the joint space was irrigated and fat tissue was  placed inside the joint.  The capsule was then closed with 4-0 Vicryl and the incision was closed with 4-0 Vicryl deep and subcutaneous, and 5-0 Prolene.  Then, attention was turned to the left TMJ.  Similar incision outline was made with a marking pen.   Local anesthesia  was administered.  The 15 blade was used to make an incision.  The incision was dissected superiorly first until the superficial layer of deep temporal fascia was found.  This was used as a plane of dissection throughout the entire  length of the incision.  Then, the oblique incision was made into the temporal fascia to allow for dissection down to the mandibular arch with the periosteal elevator. Then, this dissection was carried forward along the malar arch until the capsular  joint was identified.  The capsule was dissected into the neck of the condyle and then incised with the 15 blade.  The superior joint space showed that the anterior displaced disc had malformed disc.  The access was gained to the inferior joint space and  then the disc was isolated and removed with 15 blade, scissors and Beaver blades.  Then, the joint was irrigated.  The fat graft was placed and the capsule was closed with 4-0 Vicryl.  The incision was then closed with a 4-0 Vicryl and 5-0 Prolene.   Then, the external auditory canals were irrigated and Q-tips were used to remove any heme. Local  anesthesia, Marcaine 0.5%, 1:200,000 epinephrine was infiltrated into both joint spaces and along the incision line and along the inferior border of the mandible where the joint clamp was placed.  Then, bacitracin, Telfa, fluffs and Ace wrap was placed.   The abdomen was dressed using bacitracin, 4 x 4's, and tape.  The patient was then left under care of anesthesia for extubation and transferred to recovery room with plans for discharge home through day surgery.  ESTIMATED BLOOD LOSS:  250 mL.  DRAINS:  None.  SPECIMENS:  Right and left TMJ disc.  DISPOSITION:  To PACU and then discharge if stable.   NIK D: 01/21/2022 10:21:36 am T: 01/21/2022 11:17:00 am  JOB: [id number]/ [id number]

## 2022-01-21 NOTE — H&P (Signed)
H&P documentation  -History and Physical Reviewed  -Patient has been re-examined  -No change in the plan of care  Amy Glass  

## 2022-01-21 NOTE — Research (Signed)
[place] Essence Week 5 day 29 09-January-2022

## 2022-01-22 ENCOUNTER — Encounter (HOSPITAL_COMMUNITY): Payer: Self-pay | Admitting: Oral Surgery

## 2022-01-23 LAB — SURGICAL PATHOLOGY

## 2022-01-28 DIAGNOSIS — E7800 Pure hypercholesterolemia, unspecified: Secondary | ICD-10-CM | POA: Diagnosis not present

## 2022-01-28 DIAGNOSIS — Z79899 Other long term (current) drug therapy: Secondary | ICD-10-CM | POA: Diagnosis not present

## 2022-01-28 DIAGNOSIS — Z0130 Encounter for examination of blood pressure without abnormal findings: Secondary | ICD-10-CM | POA: Diagnosis not present

## 2022-01-28 DIAGNOSIS — F1120 Opioid dependence, uncomplicated: Secondary | ICD-10-CM | POA: Diagnosis not present

## 2022-01-28 DIAGNOSIS — F319 Bipolar disorder, unspecified: Secondary | ICD-10-CM | POA: Diagnosis not present

## 2022-01-28 DIAGNOSIS — Z6828 Body mass index (BMI) 28.0-28.9, adult: Secondary | ICD-10-CM | POA: Diagnosis not present

## 2022-01-28 DIAGNOSIS — E559 Vitamin D deficiency, unspecified: Secondary | ICD-10-CM | POA: Diagnosis not present

## 2022-01-28 DIAGNOSIS — E1165 Type 2 diabetes mellitus with hyperglycemia: Secondary | ICD-10-CM | POA: Diagnosis not present

## 2022-01-28 DIAGNOSIS — F909 Attention-deficit hyperactivity disorder, unspecified type: Secondary | ICD-10-CM | POA: Diagnosis not present

## 2022-01-29 DIAGNOSIS — F900 Attention-deficit hyperactivity disorder, predominantly inattentive type: Secondary | ICD-10-CM | POA: Diagnosis not present

## 2022-01-29 DIAGNOSIS — F411 Generalized anxiety disorder: Secondary | ICD-10-CM | POA: Diagnosis not present

## 2022-01-29 DIAGNOSIS — F331 Major depressive disorder, recurrent, moderate: Secondary | ICD-10-CM | POA: Diagnosis not present

## 2022-02-04 DIAGNOSIS — Z79899 Other long term (current) drug therapy: Secondary | ICD-10-CM | POA: Diagnosis not present

## 2022-02-12 ENCOUNTER — Other Ambulatory Visit: Payer: Self-pay | Admitting: Internal Medicine

## 2022-02-12 ENCOUNTER — Other Ambulatory Visit: Payer: Self-pay

## 2022-02-12 ENCOUNTER — Encounter: Payer: Medicare HMO | Admitting: *Deleted

## 2022-02-12 VITALS — BP 116/76 | HR 66 | Temp 98.3°F | Resp 18

## 2022-02-12 DIAGNOSIS — F411 Generalized anxiety disorder: Secondary | ICD-10-CM | POA: Diagnosis not present

## 2022-02-12 DIAGNOSIS — F900 Attention-deficit hyperactivity disorder, predominantly inattentive type: Secondary | ICD-10-CM | POA: Diagnosis not present

## 2022-02-12 DIAGNOSIS — Z006 Encounter for examination for normal comparison and control in clinical research program: Secondary | ICD-10-CM

## 2022-02-12 DIAGNOSIS — F331 Major depressive disorder, recurrent, moderate: Secondary | ICD-10-CM | POA: Diagnosis not present

## 2022-02-12 MED ORDER — STUDY - ESSENCE - OLEZARSEN 50 MG, 80 MG OR PLACEBO SQ INJECTION (PI-HILTY)
80.0000 mg | INJECTION | Freq: Once | SUBCUTANEOUS | Status: AC
  Administered 2022-02-12: 80 mg via SUBCUTANEOUS
  Filled 2022-02-12: qty 0.8

## 2022-02-12 NOTE — Research (Signed)
TREATMENT DAY 57- STUDY WEEK 9    Subject Number: [id number]             Randomization Number:[id number]           Date:12-February-2022   Amy Glass   [x]Vital Signs Collected - Blood Pressure:116/76 - Heart Rate:66 - Respiratory Rate:18 - Temperature:98.3 - Oxygen Saturation:98%   [x] Extended Urinalysis   [x] Lab collection per protocol  [x] (abdominal pain only) since last visit  [x] Assessment of ER Visits, Hospitalizations, and Inpatient Days  [x] Adverse Events and Concomitant Medications  [x] Diet, Lifestyle, and Alcohol Counseling   [x] Study Drug: [place] Injection    Amy Glass here for week 9 day 57 visit . She reports no abd pain, no ED or Urgent care visits. She did have Bilateral TMJ internal Derangement 01-21-22. She states she is much better now.VS taken at 0942, Blood drawn at 0951, and Urine obtained 0954. Injection was given in right lower abd at 1012 kit number [id number]. Next visit scheduled for Aug 25 at 0800.  

## 2022-02-14 ENCOUNTER — Ambulatory Visit: Payer: Self-pay

## 2022-02-14 NOTE — Research (Addendum)
[place] [facility] Week 9 Day 57 12-February-2022          Chemistry: Glucose 150    mg/dL                       '[]'$ Clinically Significant  '[x]'$ Not Clinically Significant Phosphorus 5.1 mg/dL                     '[]'$ Clinically Significant  '[x]'$ Not Clinically Significant Gamma Glutamyl Transfers (GGT) 50 U/L  '[]'$ Clinically Significant  '[x]'$ Not Clinically Significant  Hematology: Hemoglobin 11.2  g/dL                      '[]'$ Clinically Significant  '[x]'$ Not Clinically Significant  Urinalysis: Protein 20   mg/dL                              '[]'$ Clinically Significant  '[x]'$ Not Clinically Significant Leukocyte Esterase 500                     '[]'$ Clinically Significant  '[x]'$ Not Clinically Significant Urinary white Blood cella 6-9              '[]'$ Clinically Significant  '[x]'$ Not Clinically Significant Bacteria 1+                                          '[]'$ Clinically Significant  '[x]'$ Not Clinically Significant Squamous Epithelial Cells  3-5           '[]'$ Clinically Significant  '[x]'$ Not Clinically Significant Mucus +                                               '[]'$ Clinically Significant  '[x]'$ Not Clinically Significant     Any further action needed to be taken per the PI? No  Pixie Casino, MD, [name], [place] Director of the Advanced Lipid Disorders &  Cardiovascular Risk Reduction Clinic Diplomate of the American Board of Clinical Lipidology Attending Cardiologist  Direct Dial: [phone]  Fax: [phone]  Website:  [email or web address]

## 2022-02-14 NOTE — Telephone Encounter (Signed)
  Chief Complaint: partial hearing loss to left ear Symptoms: unable to hear whisper or low tones, gets off balancing or pain Frequency: since TMJ surgery Pertinent Negatives: Patient denies ring in ears Disposition: '[]'$ ED /'[x]'$ Urgent Care (no appt availability in office) / '[]'$ Appointment(In office/virtual)/ '[]'$  [place] [facility]/ '[]'$ Home Care/ '[]'$ Refused Recommended Disposition /'[]'$ [place] [facility]/ '[]'$  Follow-up with PCP Additional Notes: needs New pt appt Reason for Disposition  [1] Decreased hearing in 1 ear AND [2] gradual onset  Answer Assessment - Initial Assessment Questions 1. DESCRIPTION: "What type of hearing problem are you having? Describe it for me." (e.g., complete hearing loss, partial loss)    Partial hearing loss right better than left one 2. LOCATION: "One or both ears?" If one, ask: "Which ear?"     Both cannot whisper- left  3. SEVERITY: "Can you hear anything?" If Yes, ask: "What can you hear?" (e.g., ticking watch, whisper, talking)   - MILD:  Difficulty hearing soft speech, quiet library sounds, or speech from a distance or over background noise.   - MODERATE: Difficulty hearing normal speech even at closed distances.   - SEVERE: Unable to hear most normal conversation and talking; only able to hear loud sounds such as an alarm clock.     Left milld  4. ONSET: "When did this begin?" "Did it start suddenly or come on gradually?"     After TMJ surgery 5. PATTERN: "Does this come and go, or has it been constant since it started?"     constant 6. PAIN: "Is there any pain in your ear(s)?"  (Scale 1-10; or mild, moderate, severe)   - NONE (0): no pain   - MILD (1-3): doesn't interfere with normal activities    - MODERATE (4-7): interferes with normal activities or awakens from sleep    - SEVERE (8-10): excruciating pain, unable to do any normal activities      no 7. CAUSE: "What do you think is causing this hearing problem?"     Problems after  8. OTHER SYMPTOMS:  "Do you have any other symptoms?" (e.g., dizziness, ringing in ears)     no 9. PREGNANCY: "Is there any chance you are pregnant?" "When was your last menstrual period?"     N/a  Protocols used: Hearing Loss or Change-A-AH

## 2022-02-19 DIAGNOSIS — F900 Attention-deficit hyperactivity disorder, predominantly inattentive type: Secondary | ICD-10-CM | POA: Diagnosis not present

## 2022-02-19 DIAGNOSIS — F331 Major depressive disorder, recurrent, moderate: Secondary | ICD-10-CM | POA: Diagnosis not present

## 2022-02-19 DIAGNOSIS — F411 Generalized anxiety disorder: Secondary | ICD-10-CM | POA: Diagnosis not present

## 2022-02-19 NOTE — Research (Addendum)
Amy Glass Essence Week 9 Day 57 12-February-2022

## 2022-02-26 DIAGNOSIS — F331 Major depressive disorder, recurrent, moderate: Secondary | ICD-10-CM | POA: Diagnosis not present

## 2022-02-26 DIAGNOSIS — F900 Attention-deficit hyperactivity disorder, predominantly inattentive type: Secondary | ICD-10-CM | POA: Diagnosis not present

## 2022-02-26 DIAGNOSIS — F411 Generalized anxiety disorder: Secondary | ICD-10-CM | POA: Diagnosis not present

## 2022-02-28 DIAGNOSIS — G8929 Other chronic pain: Secondary | ICD-10-CM | POA: Diagnosis not present

## 2022-02-28 DIAGNOSIS — Z79899 Other long term (current) drug therapy: Secondary | ICD-10-CM | POA: Diagnosis not present

## 2022-02-28 DIAGNOSIS — Z0130 Encounter for examination of blood pressure without abnormal findings: Secondary | ICD-10-CM | POA: Diagnosis not present

## 2022-02-28 DIAGNOSIS — M542 Cervicalgia: Secondary | ICD-10-CM | POA: Diagnosis not present

## 2022-02-28 DIAGNOSIS — Z6828 Body mass index (BMI) 28.0-28.9, adult: Secondary | ICD-10-CM | POA: Diagnosis not present

## 2022-02-28 DIAGNOSIS — E7800 Pure hypercholesterolemia, unspecified: Secondary | ICD-10-CM | POA: Diagnosis not present

## 2022-02-28 DIAGNOSIS — M549 Dorsalgia, unspecified: Secondary | ICD-10-CM | POA: Diagnosis not present

## 2022-02-28 DIAGNOSIS — E1165 Type 2 diabetes mellitus with hyperglycemia: Secondary | ICD-10-CM | POA: Diagnosis not present

## 2022-02-28 DIAGNOSIS — I10 Essential (primary) hypertension: Secondary | ICD-10-CM | POA: Diagnosis not present

## 2022-03-04 ENCOUNTER — Ambulatory Visit (INDEPENDENT_AMBULATORY_CARE_PROVIDER_SITE_OTHER): Payer: Self-pay | Admitting: Podiatry

## 2022-03-04 DIAGNOSIS — Z91199 Patient's noncompliance with other medical treatment and regimen due to unspecified reason: Secondary | ICD-10-CM

## 2022-03-05 NOTE — Progress Notes (Signed)
Patient was no-show for appointment today 

## 2022-03-06 DIAGNOSIS — Z79899 Other long term (current) drug therapy: Secondary | ICD-10-CM | POA: Diagnosis not present

## 2022-03-07 NOTE — Research (Signed)
To clarify meds : Ms Minteer reports she takes Norvasc for hypertension started  10 mg 02-01-21 Adderall for ADHD started 20 mg 02-14-21 Abilify for depression started 2 mg 11-08-20 ASA for heart protections started 2022 Lipitor for high cholesterol started  20 mg 04-30-20 Pristiq for depression started 02-15-21 Flonase for allergies started 12-21-15 Vascepa for Hypertriglyceridemia started  1 g 05-28-18 Janumet for Dm started 11-25-14 Cozaar for Hypertension started 10-22-18 Mycostatin for intertrigo started 08-07-21 Pataday for itching eyes started 05-04-21 Prilosec for reflux started  40 mg 03-28-15 Ultracet for pain started 11-28-14 Trazodone for sleep started 100 mg 02-04-20

## 2022-03-12 DIAGNOSIS — F331 Major depressive disorder, recurrent, moderate: Secondary | ICD-10-CM | POA: Diagnosis not present

## 2022-03-12 DIAGNOSIS — F411 Generalized anxiety disorder: Secondary | ICD-10-CM | POA: Diagnosis not present

## 2022-03-12 DIAGNOSIS — F900 Attention-deficit hyperactivity disorder, predominantly inattentive type: Secondary | ICD-10-CM | POA: Diagnosis not present

## 2022-03-13 ENCOUNTER — Ambulatory Visit: Payer: Medicare HMO | Attending: Physician Assistant | Admitting: Physician Assistant

## 2022-03-13 ENCOUNTER — Ambulatory Visit: Payer: Medicare HMO | Admitting: Physician Assistant

## 2022-03-13 ENCOUNTER — Encounter: Payer: Self-pay | Admitting: Physician Assistant

## 2022-03-13 VITALS — BP 143/95 | HR 87 | Ht 59.0 in | Wt 142.6 lb

## 2022-03-13 DIAGNOSIS — E1165 Type 2 diabetes mellitus with hyperglycemia: Secondary | ICD-10-CM

## 2022-03-13 DIAGNOSIS — F900 Attention-deficit hyperactivity disorder, predominantly inattentive type: Secondary | ICD-10-CM | POA: Diagnosis not present

## 2022-03-13 DIAGNOSIS — F331 Major depressive disorder, recurrent, moderate: Secondary | ICD-10-CM | POA: Diagnosis not present

## 2022-03-13 DIAGNOSIS — H6122 Impacted cerumen, left ear: Secondary | ICD-10-CM

## 2022-03-13 DIAGNOSIS — F411 Generalized anxiety disorder: Secondary | ICD-10-CM | POA: Diagnosis not present

## 2022-03-13 LAB — POCT GLYCOSYLATED HEMOGLOBIN (HGB A1C): HbA1c, POC (controlled diabetic range): 8.3 % — AB (ref 0.0–7.0)

## 2022-03-13 LAB — GLUCOSE, POCT (MANUAL RESULT ENTRY): POC Glucose: 264 mg/dl — AB (ref 70–99)

## 2022-03-13 NOTE — Patient Instructions (Signed)
Debrox ear wax softening drops a day or 2 before you see ENT

## 2022-03-13 NOTE — Progress Notes (Signed)
Patient ID: Amy Glass, female   DOB: 11-30-1960, 61 y.o.   MRN: [id number]   Charles Niese, is a 61 y.o. female  GYI:[id number]  JJK:[id number]  DOB - 29-May-1961  Chief Complaint  Patient presents with   Hearing Loss       Subjective:   Amy Glass is a 61 y.o. female here today for decreased hearing in R and L ear since TMJ surgery early last month.  R has improved after she was able to get wax out of the R ear.  L is persistent.  She has ENT appt next month.  No ear pain.    No problems updated.  ALLERGIES: Allergies  Allergen Reactions   Nitroglycerin Other (See Comments)    Migraines, (only tried nitro patch. Has never tried the pills)   Tricor [Fenofibrate]     Stomach upset    PAST MEDICAL HISTORY: Past Medical History:  Diagnosis Date   ADHD (attention deficit hyperactivity disorder)    Allergy    Anxiety    as child    COVID 2020   mild   Depression    as child   Diabetes mellitus without complication ([place]) Dx 3716   Diabetes mellitus, type II ([place])    Diverticulosis    GERD (gastroesophageal reflux disease)    Hiatal hernia    Hyperlipidemia    Hyperplastic colon polyp    Hypertension Dx 2003   Schatzki's ring     MEDICATIONS AT HOME: Prior to Admission medications   Medication Sig Start Date End Date Taking? Authorizing Provider  amLODipine (NORVASC) 10 MG tablet TAKE 1 TABLET EVERY DAY 12/19/21  Yes [name], MD  amphetamine-dextroamphetamine (ADDERALL) 20 MG tablet Take 20 mg by mouth 2 (two) times daily. 02/14/21  Yes [provider]  ARIPiprazole (ABILIFY) 2 MG tablet Take 2 mg by mouth daily. 11/08/20  Yes [provider]  aspirin EC 81 MG tablet Take 81 mg by mouth daily. Swallow whole.   Yes [provider]  atorvastatin (LIPITOR) 20 MG tablet TAKE 1 TABLET EVERY DAY 09/27/21  Yes [name], MD  Blood Glucose Monitoring Suppl ([place]) w/Device KIT Use as directed to test  blood sugar once daily 10/19/21  Yes [name], MD  desvenlafaxine (PRISTIQ) 50 MG 24 hr tablet Take 50 mg by mouth daily. 02/15/21  Yes [provider]  fexofenadine (ALLEGRA) 180 MG tablet Take 1 tablet (180 mg total) by mouth daily. Patient taking differently: Take 180 mg by mouth daily as needed for allergies. 10/19/21  Yes Padgett, Rae Halsted, MD  glucose blood [facility] VERIO) test strip Use as directed to test blood sugar once daily 10/19/21  Yes [name], MD  losartan (COZAAR) 100 MG tablet TAKE 1 TABLET EVERY DAY 01/14/22  Yes [name], MD  Multiple Vitamins-Minerals (MULTIVITAMIN WITH MINERALS) tablet Take 1 tablet by mouth daily.   Yes [provider]  nystatin cream (MYCOSTATIN) Apply 1 application topically 2 (two) times daily. Patient taking differently: Apply 1 application  topically 2 (two) times daily as needed for dry skin. 08/07/21  Yes [name], MD  Olopatadine HCl (PATADAY) 0.2 % SOLN Place 1 drop into both eyes 1 day or 1 dose. Patient taking differently: Place 1 drop into both eyes daily as needed (allergies). 05/04/21  Yes Padgett, Rae Halsted, MD  omeprazole (PRILOSEC) 40 MG capsule Take 1 capsule (40 mg total) by mouth daily. 02/12/22  Yes  Jerene Bears, MD  OneTouch Delica Lancets 78M MISC Use as directed to test blood sugar once daily 10/19/21  Yes [name], MD  oxyCODONE-acetaminophen (PERCOCET) 5-325 MG tablet Take 1 tablet by mouth every 4 (four) hours as needed. 01/21/22  Yes Diona Browner, DMD  sitaGLIPtin-metformin (JANUMET) 50-1000 MG tablet Take 1 tablet by mouth 2 (two) times daily with a meal. 11/13/21  Yes [name], MD  Study - ESSENCE - olezarsen 50 mg, 80 mg or placebo SQ injection (PI-Hilty) Inject 80 mg into the skin every 28 (twenty-eight) days. For Investigational Use Only. Injection subcutaneously in protocol approved injection sites (abdomen, thigh or outer area of upper arm)  every 4 weeks in clinic. Please contact  [facility] for any questions or concerns regarding this medication.   Yes Hilty, Nadean Corwin, MD  traZODone (DESYREL) 100 MG tablet Take 1 tablet (100 mg total) by mouth at bedtime as needed for sleep. Patient taking differently: Take 100 mg by mouth at bedtime. 06/14/20  Yes Hurst, Teresa T, PA-C  VASCEPA 1 g capsule TAKE 2 CAPSULES TWICE DAILY 11/16/21  Yes Hilty, Nadean Corwin, MD    ROS: Neg resp Neg cardiac Neg GI Neg GU Neg MS Neg psych Neg neuro  Objective:   Vitals:   03/13/22 1355  BP: (!) 143/95  Pulse: 87  SpO2: 97%  Weight: 142 lb 9.6 oz (64.7 kg)  Height: '4\' 11"'  (1.499 m)   Exam General appearance : Awake, alert, not in any distress. Speech Clear. Not toxic looking HEENT: Atraumatic and Normocephalic R canal wnl and TM WNL.  L canal with cerumen deep in the canal, likely right up against TM.   Neck: Supple, no JVD. No cervical lymphadenopathy.  Chest: Good air entry bilaterally, CTAB.  No rales/rhonchi/wheezing CVS: S1 S2 regular, no murmurs.  Extremities: B/L Lower Ext shows no edema, both legs are warm to touch Neurology: Awake alert, and oriented X 3, CN II-XII intact, Non focal Skin: No Rash  Data Review Lab Results  Component Value Date   HGBA1C 8.3 (A) 03/13/2022   HGBA1C 8.6 (A) 11/13/2021   HGBA1C 6.9 08/07/2021    Assessment & Plan   1. Uncontrolled type 2 diabetes mellitus with hyperglycemia, without long-term current use of insulin ([place]) Improving-Continue current regimen and work on diabetic diet and exercise.  Patient verbalizes understanding - Glucose (CBG) - HgB A1c  2. Hearing loss due to cerumen impaction, left See ENT as planned and use debrox a day or 2 before that appointment    Return in about 3 months (around 06/13/2022) for PCP for chronic conditions.  The patient was given clear instructions to go to ER or return to medical center if symptoms don't improve,  worsen or new problems develop. The patient verbalized understanding. The patient was told to call to get lab results if they haven't heard anything in the next week.      Freeman Caldron, PA-C [facility] and [facility] [place], [place]   03/13/2022, 2:13 PM

## 2022-03-14 ENCOUNTER — Encounter: Payer: Self-pay | Admitting: *Deleted

## 2022-03-14 ENCOUNTER — Other Ambulatory Visit: Payer: Self-pay | Admitting: Physician Assistant

## 2022-03-14 ENCOUNTER — Ambulatory Visit: Payer: Self-pay | Admitting: *Deleted

## 2022-03-14 DIAGNOSIS — Z006 Encounter for examination for normal comparison and control in clinical research program: Secondary | ICD-10-CM

## 2022-03-14 MED ORDER — OLOPATADINE HCL 0.2 % OP SOLN: 1.0000 [drp] | Freq: Every day | OPHTHALMIC | 5 refills | Status: DC | PRN

## 2022-03-14 NOTE — Telephone Encounter (Signed)
Per agent: "Patient called in states , her eyes itch. She had an appointment yesterday, 08/23 and forgot to mention this. She is asking for med to be sent for it to, [id number] [place]  Phone: [phone] Fax: [phone]"    Chief Complaint: Amy Canaryitvhing Symptoms: Both eyes itchy, allergies Frequency:  Pertinent Negatives: Patient denies drainage, redness, swelling, cold symptoms, headache. Disposition: '[]'$ ED /'[]'$ Urgent Care (no appt availability in office) / '[]'$ Appointment(In office/virtual)/ '[]'$  [facility]/ '[]'$ Home Care/ '[]'$ Refused Recommended Disposition /'[]'$ [facility]/ '[x]'$  Follow-up with PCP Additional Notes: Pt requesting refill of Pazeo eye drops, not on current med profile. States has been some time. States was in for OV yesterday and forgot to mention this. Please advise.  Reason for Disposition  [1] Mild eye pain caused by sunscreen, smoke, smog, chlorine, food, soap or other mild irritant AND [2] no blurred vision    No pain, itching  Answer Assessment - Initial Assessment Questions 1. ONSET: "When did the pain start?" (e.g., minutes, hours, days)     Just itchy 2. TIMING: "Does the pain come and go, or has it been constant since it started?" (e.g., constant, intermittent, fleeting)     Itching onset this week 3. SEVERITY: "How bad is the pain?"   (Scale 1-10; mild, moderate or severe)   - MILD (1-3): doesn't interfere with normal activities    - MODERATE (4-7): interferes with normal activities or awakens from sleep    - SEVERE (8-10): excruciating pain and patient unable to do normal activities     moderate 4. LOCATION: "Where does it hurt?"  (e.g., eyelid, eye, cheekbone)     Both eyes 5. CAUSE: "What do you think is causing the pain?"     Allergies 6. VISION: "Do you have blurred vision or changes in your vision?"      no 7. EYE DISCHARGE: "Is there any discharge (pus) from the eye(s)?"  If Yes, ask: "What  color is it?"      No 8. FEVER: "Do you have a fever?" If Yes, ask: "What is it, how was it measured, and when did it start?"      no 9. OTHER SYMPTOMS: "Do you have any other symptoms?" (e.g., headache, nasal discharge, facial rash)     no  Protocols used: Eye Pain and Other Symptoms-A-AH

## 2022-03-14 NOTE — Research (Signed)
Messaged Ms Laughter to remind her of her appointment tomorrow with research parking code given and reminded to be NPO

## 2022-03-15 ENCOUNTER — Other Ambulatory Visit: Payer: Self-pay

## 2022-03-15 ENCOUNTER — Encounter: Payer: Medicare HMO | Admitting: *Deleted

## 2022-03-15 DIAGNOSIS — Z006 Encounter for examination for normal comparison and control in clinical research program: Secondary | ICD-10-CM

## 2022-03-15 MED ORDER — STUDY - ESSENCE - OLEZARSEN 50 MG, 80 MG OR PLACEBO SQ INJECTION (PI-HILTY)
80.0000 mg | INJECTION | SUBCUTANEOUS | Status: DC
  Administered 2022-03-15: 80 mg via SUBCUTANEOUS
  Filled 2022-03-15: qty 0.8

## 2022-03-15 NOTE — Research (Addendum)
TREATMENT DAY 85 - STUDY WEEK 13    Subject Number: [id number]              Randomization Number: [id number]           Date:15-Mar-2022      [x] Vital Signs Collected - Blood Pressure: 134/79 - Heart Rate:73 - Respiratory Rate:16 - Temperature:97.7 - Oxygen Saturation:99%  [x]  Extended Urinalysis   [x]  Lab collection per protocol  [x]   (abdominal pain only) since last visit  [x]  Assessment of ER Visits, Hospitalizations, and Inpatient Days  [x]  Adverse Events and Concomitant Medications  [x]  Diet, Lifestyle, and Alcohol Counseling   [x]  Study Drug:  Injection    Amy Glass here for week 13 of Essence research study. She reports no change with her medications. No abd pain, or other pain, no visits to the Ed or Urgent care. VS taken at 0812. Blood drawn at 0817, and urine obtained at 0825. Injections given in right lower abd.  Kit number [id number]  Next visit scheduled for Sept 18 at 0930  Current Outpatient Medications:    amLODipine (NORVASC) 10 MG tablet, TAKE 1 TABLET EVERY DAY, Disp: 90 tablet, Rfl: 1   amphetamine-dextroamphetamine (ADDERALL) 20 MG tablet, Take 20 mg by mouth 2 (two) times daily., Disp: , Rfl:    ARIPiprazole (ABILIFY) 2 MG tablet, Take 2 mg by mouth daily., Disp: , Rfl:    aspirin EC 81 MG tablet, Take 81 mg by mouth daily. Swallow whole., Disp: , Rfl:    atorvastatin (LIPITOR) 20 MG tablet, TAKE 1 TABLET EVERY DAY, Disp: 90 tablet, Rfl: 1   Blood Glucose Monitoring Suppl (ONETOUCH VERIO FLEX SYSTEM) w/Device KIT, Use as directed to test blood sugar once daily, Disp: 1 kit, Rfl: 0   desvenlafaxine (PRISTIQ) 50 MG 24 hr tablet, Take 50 mg by mouth daily., Disp: , Rfl:    fexofenadine (ALLEGRA) 180 MG tablet, Take 1 tablet (180 mg total) by mouth daily. (Patient taking differently: Take 180 mg by mouth daily as needed for allergies.), Disp: 90 tablet, Rfl: 1   glucose blood (ONETOUCH VERIO) test strip, Use as directed to test blood sugar once daily, Disp: 100  each, Rfl: 2   losartan (COZAAR) 100 MG tablet, TAKE 1 TABLET EVERY DAY, Disp: 90 tablet, Rfl: 1   Multiple Vitamins-Minerals (MULTIVITAMIN WITH MINERALS) tablet, Take 1 tablet by mouth daily., Disp: , Rfl:    nystatin cream (MYCOSTATIN), Apply 1 application topically 2 (two) times daily. (Patient taking differently: Apply 1 application  topically 2 (two) times daily as needed for dry skin.), Disp: 30 g, Rfl: 2   Olopatadine HCl (PATADAY) 0.2 % SOLN, Place 1 drop into both eyes daily as needed (allergies)., Disp: 2.5 mL, Rfl: 5   omeprazole (PRILOSEC) 40 MG capsule, Take 1 capsule (40 mg total) by mouth daily., Disp: 90 capsule, Rfl: 3   OneTouch Delica Lancets 33G MISC, Use as directed to test blood sugar once daily, Disp: 100 each, Rfl: 2   oxyCODONE-acetaminophen (PERCOCET) 5-325 MG tablet, Take 1 tablet by mouth every 4 (four) hours as needed., Disp: 24 tablet, Rfl: 0   sitaGLIPtin-metformin (JANUMET) 50-1000 MG tablet, Take 1 tablet by mouth 2 (two) times daily with a meal., Disp: 180 tablet, Rfl: 4   Study - ESSENCE - olezarsen 50 mg, 80 mg or placebo SQ injection (PI-Hilty), Inject 80 mg into the skin every 28 (twenty-eight) days. For Investigational Use Only. Injection subcutaneously in protocol approved injection sites (abdomen,  thigh or outer area of upper arm) every 4 weeks in clinic. Please contact  [facility] for any questions or concerns regarding this medication., Disp: , Rfl:    traZODone (DESYREL) 100 MG tablet, Take 1 tablet (100 mg total) by mouth at bedtime as needed for sleep. (Patient taking differently: Take 100 mg by mouth at bedtime.), Disp: 90 tablet, Rfl: 2   VASCEPA 1 g capsule, TAKE 2 CAPSULES TWICE DAILY, Disp: 360 capsule, Rfl: 1  Current Facility-Administered Medications:    Study - ESSENCE - olezarsen 50 mg, 80 mg or placebo SQ injection (PI-Hilty), 80 mg, Subcutaneous, Q28 days, Hilty, Lisette Abu, MD

## 2022-03-18 NOTE — Research (Signed)
Amy Glass Mag level of 1.7 sent to Dr Karle Plumber

## 2022-03-18 NOTE — Research (Addendum)
Amy Glass Essence Week 13 Day 85 15-Mar-2022         Chemistry: Glucose  170   mg/dL               '[]'$ Clinically Significant  '[x]'$ Not Clinically Significant Magnesium 1.7 mg/dL              '[x]'$ Clinically Significant  '[]'$ Not Clinically Significant Gamma Glutamyl Transfers (GGT) 85 U/L  '[]'$ Clinically Significant  '[x]'$ Not Clinically Significant   Urinalysis: Protein 10 mg/dL                             '[]'$ Clinically Significant  '[x]'$ Not Clinically Significant Leukocyte Esterase 500                  '[]'$ Clinically Significant  '[x]'$ Not Clinically Significant Urinary White Blood Cells 30-49      '[]'$ Clinically Significant  '[x]'$ Not Clinically Significant Squamous Epithelial Cells 10-15     '[]'$ Clinically Significant  '[x]'$ Not Clinically Significant Mucus 1+                                         '[]'$ Clinically Significant  '[x]'$ Not Clinically Significant     Any further action needed to be taken per the PI? YES  Magnesium remains low - advise PCP to replete  Pixie Casino, MD, [facility], [place] Director of the Advanced Lipid Disorders &  Cardiovascular Risk Reduction Clinic Diplomate of the American Board of Clinical Lipidology Attending Cardiologist  Direct Dial: [phone]  Fax: [phone]  Website:  [email or web address]

## 2022-03-19 NOTE — Research (Signed)
Amy Glass Essence Week 13 day 85 15-Mar-2022

## 2022-03-20 ENCOUNTER — Telehealth: Payer: Self-pay | Admitting: Internal Medicine

## 2022-03-20 DIAGNOSIS — F331 Major depressive disorder, recurrent, moderate: Secondary | ICD-10-CM | POA: Diagnosis not present

## 2022-03-20 DIAGNOSIS — F411 Generalized anxiety disorder: Secondary | ICD-10-CM | POA: Diagnosis not present

## 2022-03-20 DIAGNOSIS — F900 Attention-deficit hyperactivity disorder, predominantly inattentive type: Secondary | ICD-10-CM | POA: Diagnosis not present

## 2022-03-20 MED ORDER — MAGNESIUM 400 MG PO CAPS: 400.0000 mg | ORAL_CAPSULE | Freq: Every day | ORAL | 1 refills | Status: DC

## 2022-03-20 NOTE — Telephone Encounter (Signed)
-----   Message from Dorthey Sawyer, RN sent at 03/20/2022  7:41 AM EDT ----- Regarding: RE: research Med pace range is 1.8-2.4  ----- Message ----- From: [name], MD Sent: 03/19/2022   9:23 PM EDT To: Dorthey Sawyer, RN Subject: RE: research                                   I think normal range for Magnesium is 1.5-2.0.  What was the normal range listed on lab results that was obtained?  Sorry for all the follow up questions but it I can not address something without having all the information.   ----- Message ----- From: Dorthey Sawyer, RN Sent: 03/19/2022   8:25 AM EDT To: [name], MD Subject: RE: research                                    It's in the 03-15-22 research note we have to scan the labs into epic for the research study. Thanks,   ----- Message ----- From: [name], MD Sent: 03/18/2022   6:26 PM EDT To: Dorthey Sawyer, RN Subject: RE: research                                   I do not see magnesium level in her current blood test.  Where is the magnesium level? ----- Message ----- From: Dorthey Sawyer, RN Sent: 03/18/2022   9:52 AM EDT To: [name], MD Subject: research                                       Dr Wynetta Emery, Dr. Debara Pickett wanted me to reach out to you about Amy Glass's  mag level of 1.7. He wanted me to let you know so if you could replete. Thanks,   Beverly Gust, RN, BSN, RN-BC Conservation officer, nature [employer] Email: [email or web address] Office number: [phone] Fax: [phone]

## 2022-03-22 NOTE — Telephone Encounter (Signed)
Pt informed of note per pcp in regards to results & new Rx sent to pharmacy. Pt expressed understanding.---DD,RMA

## 2022-03-27 DIAGNOSIS — F331 Major depressive disorder, recurrent, moderate: Secondary | ICD-10-CM | POA: Diagnosis not present

## 2022-03-27 DIAGNOSIS — F411 Generalized anxiety disorder: Secondary | ICD-10-CM | POA: Diagnosis not present

## 2022-03-27 DIAGNOSIS — F900 Attention-deficit hyperactivity disorder, predominantly inattentive type: Secondary | ICD-10-CM | POA: Diagnosis not present

## 2022-03-27 NOTE — Research (Addendum)
Tamra Speth Essence week 13 day 85 15-Mar-2022

## 2022-04-03 NOTE — Research (Signed)
Updated ICF with PCP information. Copy given to pt.

## 2022-04-04 DIAGNOSIS — E7800 Pure hypercholesterolemia, unspecified: Secondary | ICD-10-CM | POA: Diagnosis not present

## 2022-04-04 DIAGNOSIS — Z0130 Encounter for examination of blood pressure without abnormal findings: Secondary | ICD-10-CM | POA: Diagnosis not present

## 2022-04-04 DIAGNOSIS — F319 Bipolar disorder, unspecified: Secondary | ICD-10-CM | POA: Diagnosis not present

## 2022-04-04 DIAGNOSIS — E559 Vitamin D deficiency, unspecified: Secondary | ICD-10-CM | POA: Diagnosis not present

## 2022-04-04 DIAGNOSIS — F1120 Opioid dependence, uncomplicated: Secondary | ICD-10-CM | POA: Diagnosis not present

## 2022-04-04 DIAGNOSIS — F909 Attention-deficit hyperactivity disorder, unspecified type: Secondary | ICD-10-CM | POA: Diagnosis not present

## 2022-04-04 DIAGNOSIS — E1165 Type 2 diabetes mellitus with hyperglycemia: Secondary | ICD-10-CM | POA: Diagnosis not present

## 2022-04-04 DIAGNOSIS — I10 Essential (primary) hypertension: Secondary | ICD-10-CM | POA: Diagnosis not present

## 2022-04-04 DIAGNOSIS — Z6828 Body mass index (BMI) 28.0-28.9, adult: Secondary | ICD-10-CM | POA: Diagnosis not present

## 2022-04-08 ENCOUNTER — Encounter: Payer: Self-pay | Admitting: *Deleted

## 2022-04-08 DIAGNOSIS — Z006 Encounter for examination for normal comparison and control in clinical research program: Secondary | ICD-10-CM

## 2022-04-08 NOTE — Research (Signed)
Called Ms Glandon to see if she is coming today for research. States she forgot. Re scheduled for Wed Sept 20 at 0930

## 2022-04-09 DIAGNOSIS — F331 Major depressive disorder, recurrent, moderate: Secondary | ICD-10-CM | POA: Diagnosis not present

## 2022-04-09 DIAGNOSIS — F900 Attention-deficit hyperactivity disorder, predominantly inattentive type: Secondary | ICD-10-CM | POA: Diagnosis not present

## 2022-04-09 DIAGNOSIS — F411 Generalized anxiety disorder: Secondary | ICD-10-CM | POA: Diagnosis not present

## 2022-04-10 DIAGNOSIS — Z006 Encounter for examination for normal comparison and control in clinical research program: Secondary | ICD-10-CM

## 2022-04-10 MED ORDER — STUDY - ESSENCE - OLEZARSEN 50 MG, 80 MG OR PLACEBO SQ INJECTION (PI-HILTY)
80.0000 mg | INJECTION | Freq: Once | SUBCUTANEOUS | Status: AC
  Administered 2022-04-10: 80 mg via SUBCUTANEOUS
  Filled 2022-04-10: qty 0.8

## 2022-04-10 NOTE — Research (Signed)
Patient was seen for visit week 17 of ESSENCE trial. All concomitant medications reviewed and no changes noted at this time. She denies any adverse events or hospitalizations since last visit. All procedures completed per protocol. No labs due. IP was given in RUQ. Patient tolerated well without any complaints Next appt scheduled Oct 20 @ 0930.   Current Outpatient Medications:    amLODipine (NORVASC) 10 MG tablet, TAKE 1 TABLET EVERY DAY, Disp: 90 tablet, Rfl: 1   amphetamine-dextroamphetamine (ADDERALL) 20 MG tablet, Take 20 mg by mouth 2 (two) times daily., Disp: , Rfl:    ARIPiprazole (ABILIFY) 2 MG tablet, Take 2 mg by mouth daily., Disp: , Rfl:    aspirin EC 81 MG tablet, Take 81 mg by mouth daily. Swallow whole., Disp: , Rfl:    atorvastatin (LIPITOR) 20 MG tablet, TAKE 1 TABLET EVERY DAY, Disp: 90 tablet, Rfl: 1   Blood Glucose Monitoring Suppl (ONETOUCH VERIO FLEX SYSTEM) w/Device KIT, Use as directed to test blood sugar once daily, Disp: 1 kit, Rfl: 0   desvenlafaxine (PRISTIQ) 50 MG 24 hr tablet, Take 50 mg by mouth daily., Disp: , Rfl:    fexofenadine (ALLEGRA) 180 MG tablet, Take 1 tablet (180 mg total) by mouth daily. (Patient taking differently: Take 180 mg by mouth daily as needed for allergies.), Disp: 90 tablet, Rfl: 1   glucose blood (ONETOUCH VERIO) test strip, Use as directed to test blood sugar once daily, Disp: 100 each, Rfl: 2   losartan (COZAAR) 100 MG tablet, TAKE 1 TABLET EVERY DAY, Disp: 90 tablet, Rfl: 1   Magnesium 400 MG CAPS, Take 400 mg by mouth daily., Disp: 30 capsule, Rfl: 1   Multiple Vitamins-Minerals (MULTIVITAMIN WITH MINERALS) tablet, Take 1 tablet by mouth daily., Disp: , Rfl:    nystatin cream (MYCOSTATIN), Apply 1 application topically 2 (two) times daily. (Patient taking differently: Apply 1 application  topically 2 (two) times daily as needed for dry skin.), Disp: 30 g, Rfl: 2   Olopatadine HCl (PATADAY) 0.2 % SOLN, Place 1 drop into both eyes daily as  needed (allergies)., Disp: 2.5 mL, Rfl: 5   omeprazole (PRILOSEC) 40 MG capsule, Take 1 capsule (40 mg total) by mouth daily., Disp: 90 capsule, Rfl: 3   OneTouch Delica Lancets 60Y MISC, Use as directed to test blood sugar once daily, Disp: 100 each, Rfl: 2   oxyCODONE-acetaminophen (PERCOCET) 5-325 MG tablet, Take 1 tablet by mouth every 4 (four) hours as needed., Disp: 24 tablet, Rfl: 0   sitaGLIPtin-metformin (JANUMET) 50-1000 MG tablet, Take 1 tablet by mouth 2 (two) times daily with a meal., Disp: 180 tablet, Rfl: 4   Study - ESSENCE - olezarsen 50 mg, 80 mg or placebo SQ injection (PI-Hilty), Inject 80 mg into the skin every 28 (twenty-eight) days. For Investigational Use Only. Injection subcutaneously in protocol approved injection sites (abdomen, thigh or outer area of upper arm) every 4 weeks in clinic. Please contact  [facility] for any questions or concerns regarding this medication., Disp: , Rfl:    traZODone (DESYREL) 100 MG tablet, Take 1 tablet (100 mg total) by mouth at bedtime as needed for sleep. (Patient taking differently: Take 100 mg by mouth at bedtime.), Disp: 90 tablet, Rfl: 2   VASCEPA 1 g capsule, TAKE 2 CAPSULES TWICE DAILY, Disp: 360 capsule, Rfl: 1

## 2022-04-17 ENCOUNTER — Other Ambulatory Visit: Payer: Self-pay | Admitting: Podiatry

## 2022-04-17 DIAGNOSIS — F900 Attention-deficit hyperactivity disorder, predominantly inattentive type: Secondary | ICD-10-CM | POA: Diagnosis not present

## 2022-04-17 DIAGNOSIS — M25572 Pain in left ankle and joints of left foot: Secondary | ICD-10-CM

## 2022-04-17 DIAGNOSIS — F331 Major depressive disorder, recurrent, moderate: Secondary | ICD-10-CM | POA: Diagnosis not present

## 2022-04-17 DIAGNOSIS — F411 Generalized anxiety disorder: Secondary | ICD-10-CM | POA: Diagnosis not present

## 2022-04-19 DIAGNOSIS — H6123 Impacted cerumen, bilateral: Secondary | ICD-10-CM | POA: Diagnosis not present
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
## 2022-04-22 ENCOUNTER — Ambulatory Visit: Payer: Medicare HMO | Admitting: General Practice

## 2022-04-23 DIAGNOSIS — F900 Attention-deficit hyperactivity disorder, predominantly inattentive type: Secondary | ICD-10-CM | POA: Diagnosis not present

## 2022-04-23 DIAGNOSIS — F331 Major depressive disorder, recurrent, moderate: Secondary | ICD-10-CM | POA: Diagnosis not present

## 2022-04-23 DIAGNOSIS — F411 Generalized anxiety disorder: Secondary | ICD-10-CM | POA: Diagnosis not present
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
## 2022-04-24 DIAGNOSIS — F331 Major depressive disorder, recurrent, moderate: Secondary | ICD-10-CM | POA: Diagnosis not present

## 2022-04-24 DIAGNOSIS — F900 Attention-deficit hyperactivity disorder, predominantly inattentive type: Secondary | ICD-10-CM | POA: Diagnosis not present

## 2022-04-24 DIAGNOSIS — F411 Generalized anxiety disorder: Secondary | ICD-10-CM | POA: Diagnosis not present

## 2022-04-25 ENCOUNTER — Telehealth: Payer: Self-pay | Admitting: Internal Medicine

## 2022-04-25 NOTE — Telephone Encounter (Signed)
Calling to see if she needs to get labs done before her appt on Monday. Please advise

## 2022-04-25 NOTE — Telephone Encounter (Signed)
Called patient, no mention of having anything before the follow up appointment. Advised labs may be ordered at the appointment, but no mention of anything before.   Patient verbalized understanding

## 2022-04-29 ENCOUNTER — Encounter: Payer: Self-pay | Admitting: General Practice

## 2022-04-29 ENCOUNTER — Ambulatory Visit: Payer: Medicare HMO | Attending: General Practice | Admitting: General Practice

## 2022-04-29 VITALS — BP 128/84 | HR 79 | Ht 59.0 in | Wt 140.4 lb

## 2022-04-29 DIAGNOSIS — I10 Essential (primary) hypertension: Secondary | ICD-10-CM | POA: Diagnosis not present

## 2022-04-29 DIAGNOSIS — E119 Type 2 diabetes mellitus without complications: Secondary | ICD-10-CM | POA: Diagnosis not present

## 2022-04-29 DIAGNOSIS — E781 Pure hyperglyceridemia: Secondary | ICD-10-CM | POA: Diagnosis not present

## 2022-04-29 DIAGNOSIS — R079 Chest pain, unspecified: Secondary | ICD-10-CM

## 2022-04-29 NOTE — Patient Instructions (Signed)
Medication Instructions:  The current medical regimen is effective;  continue present plan and medications as directed. Please refer to the Current Medication list given to you today.   *If you need a refill on your cardiac medications before your next appointment, please call your pharmacy*  Lab Work: FASTING LIPID AND LFT THIS WEEK If you have labs (blood work) drawn today and your tests are completely normal, you will receive your results only by:  [facility] (if you have MyChart) OR  A paper copy in the mail  If you have any lab test that is abnormal or we need to change your treatment, we will call you to review the results.  Other Instructions PLEASE READ AND FOLLOW INCREASED FIB DIET-ATTACHED  Follow-Up: At [facility], you and your health needs are our priority.  As part of our continuing mission to provide you with exceptional heart care, we have created designated Provider Care Teams.  These Care Teams include your primary Cardiologist (physician) and Advanced Practice Providers (APPs -  Physician Assistants and Nurse Practitioners) who all work together to provide you with the care you need, when you need it.  Your next appointment:   9-12 month(s)  The format for your next appointment:   In Person  Provider:   Pixie Casino, MD  or Coletta Memos, FNP       Important Information About Sugar       High-Fiber Eating Plan Fiber, also called dietary fiber, is a type of carbohydrate. It is found foods such as fruits, vegetables, whole grains, and beans. A high-fiber diet can have many health benefits. Your health care provider may recommend a high-fiber diet to help: Prevent constipation. Fiber can make your bowel movements more regular. Lower your cholesterol. Relieve the following conditions: Inflammation of veins in the anus (hemorrhoids). Inflammation of specific areas of the digestive tract (uncomplicated diverticulosis). A problem of the large  intestine, also called the colon, that sometimes causes pain and diarrhea (irritable bowel syndrome, or IBS). Prevent overeating as part of a weight-loss plan. Prevent heart disease, type 2 diabetes, and certain cancers. What are tips for following this plan? Reading food labels  Check the nutrition facts label on food products for the amount of dietary fiber. Choose foods that have 5 grams of fiber or more per serving. The goals for recommended daily fiber intake include: Men (age 58 or younger): 34-38 g. Men (over age 68): 28-34 g. Women (age 23 or younger): 25-28 g. Women (over age 54): 22-25 g. Shopping Choose whole fruits and vegetables instead of processed forms, such as apple juice or applesauce. Choose a wide variety of high-fiber foods such as avocados, lentils, oats, and kidney beans. Read the nutrition facts label of the foods you choose. Be aware of foods with added fiber. These foods often have high sugar and sodium amounts per serving. Cooking Use whole-grain flour for baking and cooking. Cook with brown rice instead of white rice. Meal planning Start the day with a breakfast that is high in fiber, such as a cereal that contains 5 g of fiber or more per serving. Eat breads and cereals that are made with whole-grain flour instead of refined flour or white flour. Eat brown rice, bulgur wheat, or millet instead of white rice. Use beans in place of meat in soups, salads, and pasta dishes. Be sure that half of the grains you eat each day are whole grains. General information You can get the recommended daily intake  of dietary fiber by: Eating a variety of fruits, vegetables, grains, nuts, and beans. Taking a fiber supplement if you are not able to take in enough fiber in your diet. It is better to get fiber through food than from a supplement. Gradually increase how much fiber you consume. If you increase your intake of dietary fiber too quickly, you may have bloating, cramping,  or gas. Drink plenty of water to help you digest fiber. Choose high-fiber snacks, such as berries, raw vegetables, nuts, and popcorn. What foods should I eat? Fruits Berries. Pears. Apples. Oranges. Avocado. Prunes and raisins. Dried figs. Vegetables Sweet potatoes. Spinach. Kale. Artichokes. Cabbage. Broccoli. Cauliflower. Green peas. Carrots. Squash. Grains Whole-grain breads. Multigrain cereal. Oats and oatmeal. Brown rice. Barley. Bulgur wheat. [place]. Quinoa. Bran muffins. Popcorn. Rye wafer crackers. Meats and other proteins Navy beans, kidney beans, and pinto beans. Soybeans. Split peas. Lentils. Nuts and seeds. Dairy Fiber-fortified yogurt. Beverages Fiber-fortified soy milk. Fiber-fortified orange juice. Other foods Fiber bars. The items listed above may not be a complete list of recommended foods and beverages. Contact a dietitian for more information. What foods should I avoid? Fruits Fruit juice. Cooked, strained fruit. Vegetables Fried potatoes. Canned vegetables. Well-cooked vegetables. Grains White bread. Pasta made with refined flour. White rice. Meats and other proteins Fatty cuts of meat. Fried chicken or fried fish. Dairy Milk. Yogurt. Cream cheese. Sour cream. Fats and oils Butters. Beverages Soft drinks. Other foods Cakes and pastries. The items listed above may not be a complete list of foods and beverages to avoid. Talk with your dietitian about what choices are best for you. Summary Fiber is a type of carbohydrate. It is found in foods such as fruits, vegetables, whole grains, and beans. A high-fiber diet has many benefits. It can help to prevent constipation, lower blood cholesterol, aid weight loss, and reduce your risk of heart disease, diabetes, and certain cancers. Increase your intake of fiber gradually. Increasing fiber too quickly may cause cramping, bloating, and gas. Drink plenty of water while you increase the amount of fiber you  consume. The best sources of fiber include whole fruits and vegetables, whole grains, nuts, seeds, and beans. This information is not intended to replace advice given to you by your health care provider. Make sure you discuss any questions you have with your health care provider. Document Revised: 11/11/2019 Document Reviewed: 11/11/2019 Elsevier Patient Education  [place].

## 2022-05-01 DIAGNOSIS — F411 Generalized anxiety disorder: Secondary | ICD-10-CM | POA: Diagnosis not present

## 2022-05-01 DIAGNOSIS — F331 Major depressive disorder, recurrent, moderate: Secondary | ICD-10-CM | POA: Diagnosis not present

## 2022-05-01 DIAGNOSIS — F900 Attention-deficit hyperactivity disorder, predominantly inattentive type: Secondary | ICD-10-CM | POA: Diagnosis not present

## 2022-05-08 DIAGNOSIS — F331 Major depressive disorder, recurrent, moderate: Secondary | ICD-10-CM | POA: Diagnosis not present

## 2022-05-08 DIAGNOSIS — F411 Generalized anxiety disorder: Secondary | ICD-10-CM | POA: Diagnosis not present

## 2022-05-08 DIAGNOSIS — F900 Attention-deficit hyperactivity disorder, predominantly inattentive type: Secondary | ICD-10-CM | POA: Diagnosis not present

## 2022-05-10 ENCOUNTER — Other Ambulatory Visit: Payer: Self-pay

## 2022-05-10 ENCOUNTER — Encounter: Payer: Medicare HMO | Admitting: *Deleted

## 2022-05-10 VITALS — BP 131/82 | HR 89 | Temp 97.9°F | Resp 16

## 2022-05-10 DIAGNOSIS — Z006 Encounter for examination for normal comparison and control in clinical research program: Secondary | ICD-10-CM

## 2022-05-10 MED ORDER — STUDY - ESSENCE - OLEZARSEN 50 MG, 80 MG OR PLACEBO SQ INJECTION (PI-HILTY)
80.0000 mg | INJECTION | SUBCUTANEOUS | Status: DC
  Administered 2022-05-10: 80 mg via SUBCUTANEOUS
  Filled 2022-05-10: qty 0.8

## 2022-05-10 NOTE — Research (Addendum)
TREATMENT - Day 141 -Week 21    Subject Number: [id number]              Randomization Number: [id number]          Date:10-May-2022      [x] Vital Signs Collected - Blood Pressure:131/82 - Heart Rate:89 - Respiratory Rate:16 - Temperature:97.9 - Oxygen Saturation:99%   [x]  (abdominal pain only) since last visit  [x]  Assessment of ER Visits, Hospitalizations, and Inpatient Days  [x]  Adverse Events and Concomitant Medications  [x]  Diet, Lifestyle, and Alcohol Counseling   [x]  Study Drug: Florin Injection    Amy Glass is here for Essence Week 21 visit. She reports no abd pain. No visits to the ED or Urgent care since last visit. No changes in her medications. VS taken at 0937. Injection given at 1011 in right lower abd. Tol well.   Kit number [id number] Next visit scheduled for Nov 17 at 0930.  Current Outpatient Medications:    amLODipine (NORVASC) 10 MG tablet, TAKE 1 TABLET EVERY DAY, Disp: 90 tablet, Rfl: 1   amphetamine-dextroamphetamine (ADDERALL) 20 MG tablet, Take 20 mg by mouth 2 (two) times daily., Disp: , Rfl:    ARIPiprazole (ABILIFY) 2 MG tablet, Take 2 mg by mouth daily., Disp: , Rfl:    aspirin EC 81 MG tablet, Take 81 mg by mouth daily. Swallow whole., Disp: , Rfl:    atorvastatin (LIPITOR) 20 MG tablet, TAKE 1 TABLET EVERY DAY, Disp: 90 tablet, Rfl: 1   Blood Glucose Monitoring Suppl (ONETOUCH VERIO FLEX SYSTEM) w/Device KIT, Use as directed to test blood sugar once daily, Disp: 1 kit, Rfl: 0   desvenlafaxine (PRISTIQ) 50 MG 24 hr tablet, Take 50 mg by mouth daily., Disp: , Rfl:    fexofenadine (ALLEGRA) 180 MG tablet, Take 1 tablet (180 mg total) by mouth daily. (Patient taking differently: Take 180 mg by mouth daily as needed for allergies.), Disp: 90 tablet, Rfl: 1   glucose blood (ONETOUCH VERIO) test strip, Use as directed to test blood sugar once daily, Disp: 100 each, Rfl: 2   hydrOXYzine (ATARAX) 25 MG tablet, Take 25 mg by mouth daily., Disp: , Rfl:    losartan  (COZAAR) 100 MG tablet, TAKE 1 TABLET EVERY DAY, Disp: 90 tablet, Rfl: 1   Magnesium 400 MG CAPS, Take 400 mg by mouth daily., Disp: 30 capsule, Rfl: 1   nystatin cream (MYCOSTATIN), Apply 1 application topically 2 (two) times daily. (Patient taking differently: Apply 1 application  topically 2 (two) times daily as needed for dry skin.), Disp: 30 g, Rfl: 2   OneTouch Delica Lancets 33G MISC, Use as directed to test blood sugar once daily, Disp: 100 each, Rfl: 2   sitaGLIPtin-metformin (JANUMET) 50-1000 MG tablet, Take 1 tablet by mouth 2 (two) times daily with a meal., Disp: 180 tablet, Rfl: 4   Study - ESSENCE - olezarsen 50 mg, 80 mg or placebo SQ injection (PI-Hilty), Inject 80 mg into the skin every 28 (twenty-eight) days. For Investigational Use Only. Injection subcutaneously in protocol approved injection sites (abdomen, thigh or outer area of upper arm) every 4 weeks in clinic. Please contact  [facility] for any questions or concerns regarding this medication., Disp: , Rfl:    traMADol-acetaminophen (ULTRACET) 37.5-325 MG tablet, , Disp: , Rfl:    traZODone (DESYREL) 100 MG tablet, Take 1 tablet (100 mg total) by mouth at bedtime as needed for sleep. (Patient taking differently: Take 100 mg by  mouth at bedtime.), Disp: 90 tablet, Rfl: 2   VASCEPA 1 g capsule, TAKE 2 CAPSULES TWICE DAILY, Disp: 360 capsule, Rfl: 1   Multiple Vitamins-Minerals (MULTIVITAMIN WITH MINERALS) tablet, Take 1 tablet by mouth daily. (Patient not taking: Reported on 05/10/2022), Disp: , Rfl:    Olopatadine HCl (PATADAY) 0.2 % SOLN, Place 1 drop into both eyes daily as needed (allergies). (Patient not taking: Reported on 04/29/2022), Disp: 2.5 mL, Rfl: 5   omeprazole (PRILOSEC) 40 MG capsule, Take 1 capsule (40 mg total) by mouth daily. (Patient not taking: Reported on 04/29/2022), Disp: 90 capsule, Rfl: 3  Current Facility-Administered Medications:    Study - ESSENCE - olezarsen 50 mg, 80  mg or placebo SQ injection (PI-Hilty), 80 mg, Subcutaneous, Q28 days, Hilty, Lisette Abu, MD

## 2022-05-13 ENCOUNTER — Telehealth: Payer: Self-pay

## 2022-05-13 NOTE — Telephone Encounter (Signed)
-----   Message from Deberah Pelton, NP sent at 05/09/2022  6:45 AM EDT ----- Please discontinue her lipids and LFTs.  She is in the essence study.  See message from [employer].  Thank you. ----- Message ----- From: Dorthey Sawyer, RN Sent: 05/08/2022  12:42 PM EDT To: Patton Salles, RN; Jackqulyn Livings, RN; #  It looks like Ms Plotner has an order for Lipids to be drawn. She is in a blinded study for elevated TG called Essence. Could you please remove this order? We need her to be blinded to her TG levels. Please contact me with any questions.  Dr Debara Pickett is the PI for this study and aware that she needs to stay blinded. Thanks, Gust Rung, RN, BSN, RN-BC Conservation officer, nature [employer] Email: [email or web address] Office number: [phone] Fax: [phone]

## 2022-05-15 DIAGNOSIS — F331 Major depressive disorder, recurrent, moderate: Secondary | ICD-10-CM | POA: Diagnosis not present

## 2022-05-15 DIAGNOSIS — F900 Attention-deficit hyperactivity disorder, predominantly inattentive type: Secondary | ICD-10-CM | POA: Diagnosis not present

## 2022-05-15 DIAGNOSIS — F411 Generalized anxiety disorder: Secondary | ICD-10-CM | POA: Diagnosis not present

## 2022-05-23 DIAGNOSIS — F411 Generalized anxiety disorder: Secondary | ICD-10-CM | POA: Diagnosis not present

## 2022-05-23 DIAGNOSIS — F331 Major depressive disorder, recurrent, moderate: Secondary | ICD-10-CM | POA: Diagnosis not present

## 2022-05-23 DIAGNOSIS — F900 Attention-deficit hyperactivity disorder, predominantly inattentive type: Secondary | ICD-10-CM | POA: Diagnosis not present

## 2022-05-28 ENCOUNTER — Encounter (HOSPITAL_COMMUNITY): Payer: Self-pay

## 2022-05-28 ENCOUNTER — Emergency Department (HOSPITAL_COMMUNITY)
Admission: EM | Admit: 2022-05-28 | Discharge: 2022-05-28 | Discharge status: Against Medical Advice | Payer: Medicare HMO | Attending: Emergency Medicine | Admitting: Emergency Medicine

## 2022-05-28 ENCOUNTER — Ambulatory Visit: Payer: Self-pay | Admitting: *Deleted

## 2022-05-28 ENCOUNTER — Other Ambulatory Visit: Payer: Self-pay

## 2022-05-28 DIAGNOSIS — E1165 Type 2 diabetes mellitus with hyperglycemia: Secondary | ICD-10-CM | POA: Diagnosis not present

## 2022-05-28 DIAGNOSIS — R739 Hyperglycemia, unspecified: Secondary | ICD-10-CM | POA: Insufficient documentation

## 2022-05-28 DIAGNOSIS — Z5321 Procedure and treatment not carried out due to patient leaving prior to being seen by health care provider: Secondary | ICD-10-CM | POA: Diagnosis not present

## 2022-05-28 DIAGNOSIS — R000 Tachycardia, unspecified: Secondary | ICD-10-CM | POA: Diagnosis not present

## 2022-05-28 LAB — CBC WITH DIFFERENTIAL/PLATELET
Abs Immature Granulocytes: 0.04 10*3/uL (ref 0.00–0.07)
Basophils Absolute: 0.1 10*3/uL (ref 0.0–0.1)
Basophils Relative: 1 %
Eosinophils Absolute: 0.1 10*3/uL (ref 0.0–0.5)
Eosinophils Relative: 1 %
HCT: 40.3 % (ref 36.0–46.0)
Hemoglobin: 13.7 g/dL (ref 12.0–15.0)
Immature Granulocytes: 1 %
Lymphocytes Relative: 36 %
Lymphs Abs: 2.7 10*3/uL (ref 0.7–4.0)
MCH: 27.8 pg (ref 26.0–34.0)
MCHC: 34 g/dL (ref 30.0–36.0)
MCV: 81.9 fL (ref 80.0–100.0)
Monocytes Absolute: 0.5 10*3/uL (ref 0.1–1.0)
Monocytes Relative: 7 %
Neutro Abs: 4.1 10*3/uL (ref 1.7–7.7)
Neutrophils Relative %: 54 %
Platelets: 248 10*3/uL (ref 150–400)
RBC: 4.92 MIL/uL (ref 3.87–5.11)
RDW: 14.9 % (ref 11.5–15.5)
WBC: 7.5 10*3/uL (ref 4.0–10.5)
nRBC: 0 % (ref 0.0–0.2)

## 2022-05-28 LAB — BASIC METABOLIC PANEL
Anion gap: 13 (ref 5–15)
BUN: 15 mg/dL (ref 6–20)
CO2: 22 mmol/L (ref 22–32)
Calcium: 9.8 mg/dL (ref 8.9–10.3)
Chloride: 99 mmol/L (ref 98–111)
Creatinine, Ser: 0.56 mg/dL (ref 0.44–1.00)
GFR, Estimated: >60 mL/min (ref >60)
Glucose, Bld: 234 mg/dL — ABNORMAL HIGH (ref 70–99)
Potassium: 4.5 mmol/L (ref 3.5–5.1)
Sodium: 134 mmol/L — ABNORMAL LOW (ref 135–145)

## 2022-05-28 LAB — CBG MONITORING, ED: Glucose-Capillary: 229 mg/dL — ABNORMAL HIGH (ref 70–99)

## 2022-05-28 LAB — BETA-HYDROXYBUTYRIC ACID: Beta-Hydroxybutyric Acid: 0.3 mmol/L — ABNORMAL HIGH (ref 0.05–0.27)

## 2022-05-28 NOTE — ED Notes (Signed)
No longer wanted to wait

## 2022-05-28 NOTE — Telephone Encounter (Signed)
Reason for Disposition  Blood glucose > 500 mg/dL (27.8 mmol/L)  Answer Assessment - Initial Assessment Questions 1. BLOOD GLUCOSE: "What is your blood glucose level?"      Glucose 500 over yesterday.   Today 240.  2. ONSET: "When did you check the blood glucose?"     This morning 240. I'm urinating a lot and I'm thirsty.  Sleepy 3. USUAL RANGE: "What is your glucose level usually?" (e.g., usual fasting morning value, usual evening value)     120 normally 4. KETONES: "Do you check for ketones (urine or blood test strips)?" If Yes, ask: "What does the test show now?"      N/A 5. TYPE 1 or 2:  "Do you know what type of diabetes you have?"  (e.g., Type 1, Type 2, Gestational; doesn't know)      Type 2 6. INSULIN: "Do you take insulin?" "What type of insulin(s) do you use? What is the mode of delivery? (syringe, pen; injection or pump)?"      No insulin 7. DIABETES PILLS: "Do you take any pills for your diabetes?" If Yes, ask: "Have you missed taking any pills recently?"     Janumet  8. OTHER SYMPTOMS: "Do you have any symptoms?" (e.g., fever, frequent urination, difficulty breathing, dizziness, weakness, vomiting)     Very thirsty and urinating a lot and very sleepy. 9. PREGNANCY: "Is there any chance you are pregnant?" "When was your last menstrual period?"     N/A  Protocols used: Diabetes - High Blood Sugar-A-AH

## 2022-05-28 NOTE — ED Triage Notes (Signed)
Patient reports she has been eating a lot of sweets bc she has been sad and her sugar was high so she was told by her doc to come in bc it was over 500.  Denies SI/HI

## 2022-05-28 NOTE — Telephone Encounter (Signed)
  Chief Complaint: Blood sugar very high.   Yesterday at 3:00 it was over 500.  This morning it's 240 Symptoms: Very thirsty, urinating a lot and very sleepy Frequency: Not sure how long because she has not been checking her glucose.  She felt bad and checked it yesterday to find out it was over 500 per her meter. Pertinent Negatives: Patient denies Being dizzy. Disposition: '[x]'$ ED /'[]'$ Urgent Care (no appt availability in office) / '[]'$ Appointment(In office/virtual)/ '[]'$  [place] [facility]/ '[]'$ Home Care/ '[]'$ Refused Recommended Disposition /'[]'$ [place] [facility]/ '[]'$  Follow-up with PCP Additional Notes: Referred her to the ED.   She is agreeable to going.   Someone is taking her to [facility]. I sent my notes to Dr. Karle Plumber at [place].

## 2022-05-28 NOTE — ED Provider Triage Note (Signed)
Emergency Medicine Provider Triage Evaluation Note  Amy Glass , a 61 y.o. female  was evaluated in triage.  Pt complains of hyperglycemia.  States she has been in over eating at home and not taking her medicine in the last week..  Review of Systems  Per HPI  Physical Exam  BP (!) 143/96 (BP Location: Right Arm)   Pulse (!) 102   Temp 99.1 F (37.3 C) (Oral)   Resp 16   LMP 05/30/2015   SpO2 96%  Gen:   Awake, no distress   Resp:  Normal effort  MSK:   Moves extremities without difficulty  Other:    Medical Decision Making  Medically screening exam initiated at 3:18 PM.  Appropriate orders placed.  Amy Glass was informed that the remainder of the evaluation will be completed by another provider, this initial triage assessment does not replace that evaluation, and the importance of remaining in the ED until their evaluation is complete.  Hyperglycemic, will check labs although I think it is probably not DKA   Sherrill Raring, PA-C 05/28/22 1520

## 2022-05-29 ENCOUNTER — Ambulatory Visit: Payer: Self-pay | Admitting: *Deleted

## 2022-05-29 DIAGNOSIS — F411 Generalized anxiety disorder: Secondary | ICD-10-CM | POA: Diagnosis not present

## 2022-05-29 DIAGNOSIS — F331 Major depressive disorder, recurrent, moderate: Secondary | ICD-10-CM | POA: Diagnosis not present

## 2022-05-29 DIAGNOSIS — F900 Attention-deficit hyperactivity disorder, predominantly inattentive type: Secondary | ICD-10-CM | POA: Diagnosis not present

## 2022-05-29 NOTE — Telephone Encounter (Signed)
Summary: High Blood sugar   Pt is experiencing high blood sugar, no symptoms associated.  Best contact: [phone]       Chief Complaint: blood glucose 311 now and last ate at 1100 today  Symptoms: none  Frequency: today  fasting glucose this am 240  Pertinent Negatives: Patient denies sx of elevated glucose  Disposition: '[]'$ ED /'[]'$ Urgent Care (no appt availability in office) / '[]'$ Appointment(In office/virtual)/ '[]'$  [place] [facility]/ '[x]'$ Home Care/ '[]'$ Refused Recommended Disposition /'[]'$ [place] [facility]/ '[]'$  Follow-up with PCP Additional Notes:   Please advise. Patient care advise given to drink extra water and do physical activity and recheck blood glucose . Call back if greater than 300 again.         Reason for Disposition  [1] Blood glucose > 300 mg/dL (16.7 mmol/L) AND [2] does not  use insulin (e.g., not insulin-dependent; most people with type 2 diabetes)  Answer Assessment - Initial Assessment Questions 1. BLOOD GLUCOSE: "What is your blood glucose level?"      311 2. ONSET: "When did you check the blood glucose?"     Now  3. USUAL RANGE: "What is your glucose level usually?" (e.g., usual fasting morning value, usual evening value)     120's  4. KETONES: "Do you check for ketones (urine or blood test strips)?" If Yes, ask: "What does the test show now?"      na 5. TYPE 1 or 2:  "Do you know what type of diabetes you have?"  (e.g., Type 1, Type 2, Gestational; doesn't know)      na 6. INSULIN: "Do you take insulin?" "What type of insulin(s) do you use? What is the mode of delivery? (syringe, pen; injection or pump)?"      no 7. DIABETES PILLS: "Do you take any pills for your diabetes?" If Yes, ask: "Have you missed taking any pills recently?"     Yes janumet 1000 mg has not missed any doses 8. OTHER SYMPTOMS: "Do you have any symptoms?" (e.g., fever, frequent urination, difficulty breathing, dizziness, weakness, vomiting)     Denies  9. PREGNANCY: "Is there  any chance you are pregnant?" "When was your last menstrual period?"     na  Protocols used: Diabetes - High Blood Sugar-A-AH

## 2022-05-30 ENCOUNTER — Other Ambulatory Visit: Payer: Self-pay | Admitting: Pharmacist

## 2022-05-30 ENCOUNTER — Other Ambulatory Visit: Payer: Self-pay | Admitting: Internal Medicine

## 2022-05-30 MED ORDER — TRESIBA FLEXTOUCH 100 UNIT/ML ~~LOC~~ SOPN: 10.0000 [IU] | PEN_INJECTOR | Freq: Every day | SUBCUTANEOUS | 0 refills | Status: DC

## 2022-05-30 MED ORDER — LANTUS SOLOSTAR 100 UNIT/ML ~~LOC~~ SOPN: 10.0000 [IU] | PEN_INJECTOR | Freq: Every day | SUBCUTANEOUS | 1 refills | Status: DC

## 2022-05-30 MED ORDER — LANTUS SOLOSTAR 100 UNIT/ML ~~LOC~~ SOPN: 10.0000 [IU] | PEN_INJECTOR | Freq: Every day | SUBCUTANEOUS | 11 refills | Status: DC

## 2022-05-30 MED ORDER — PEN NEEDLES 32G X 4 MM MISC: 1 refills | Status: AC

## 2022-05-30 NOTE — Telephone Encounter (Signed)
Pt returning call. Per note to be seen ASAP. No available appts for some time out. States BS at 1300 today 282. Please advise. [phone]

## 2022-05-30 NOTE — Telephone Encounter (Signed)
Requested medications are due for refill today.  unsure  Requested medications are on the active medications list.  yes  Last refill. 05/30/2022 81m 1 rf - not filled  Future visit scheduled.   yes  Notes to clinic.   Pharmacy comment: Alternative Requested:THE PRESCRIBED MEDICATION IS NOT COVERED BY INSURANCE. PLEASE CONSIDER CHANGING TO ONE OF THE SUGGESTED COVERED ALTERNATIVES.   All Pharmacy Suggested Alternatives:  insulin glargine (LANTUS) 100 UNIT/ML injection insulin glargine (LANTUS SOLOSTAR) 100 UNIT/ML Solostar Pen insulin detemir (LEVEMIR) 100 UNIT/ML injection Insulin Degludec (TRESIBA) 100 UNIT/ML SOLN insulin degludec (TRESIBA FLEXTOUCH) 100 UNIT/ML FlexTouch Pen      Requested Prescriptions  Pending Prescriptions Disp Refills   LANTUS 100 UNIT/ML injection [Pharmacy Med Name: LANTUS 100 UNIT/ML VIAL]  0     Endocrinology:  Diabetes - Insulins Failed - 05/30/2022  2:09 PM      Failed - HBA1C is between 0 and 7.9 and within 180 days    HbA1c, POC (controlled diabetic range)  Date Value Ref Range Status  03/13/2022 8.3 (A) 0.0 - 7.0 % Final         Passed - Valid encounter within last 6 months    Recent Outpatient Visits           2 months ago Uncontrolled type 2 diabetes mellitus with hyperglycemia, without long-term current use of insulin ([facility]   [place]  6 months ago Uncontrolled type 2 diabetes mellitus with hyperglycemia, without long-term current use of insulin ([place]   [place] D[place]oma L[place], MD   9 months ago Controlled type 2 diabetes mellitus with complication, without long-term current use of insulin ([facility]   [place] Center[place]Ladell Pier MD   12 months ago Encounter for [employer]ual wellness exam   [place] MD   1 year ago Laceration of left thumb without foreign body  with damage to nail, initial encounter   [place] En[place], MD       Future Appointments             In 2 weeks JLadell Pier MD [place]

## 2022-05-30 NOTE — Addendum Note (Signed)
Addended by: [name] on: 05/30/2022 05:48 PM   Modules accepted: Orders

## 2022-05-30 NOTE — Progress Notes (Signed)
Pt called saying her insurance will not cover her medication for glucose.

## 2022-05-31 ENCOUNTER — Telehealth: Payer: Self-pay

## 2022-05-31 NOTE — Patient Outreach (Signed)
  Care Coordination [facility] Note Transition Care Management Follow-up Telephone Call- EMMI Alert Date of discharge and from where[facility] 05[facility]22 How have you been since you were released from the hospital? Patient responded to the EMMI alert she was sad and hopeless.  Patient agreed to LCSW call.  She is picking up her prescription for insulin this afternoon and notes her CBG reading was 247 this morning. Any questions or concerns? No  Items Reviewed: Did the pt receive and understand the discharge instructions provided?  Patient left ED due to waiting over 8 hours, did not receive DC instructions Medications obtained and verified? No  Other? No  Any new allergies since your discharge? No  Dietary orders reviewed? No Do you have support at home? Yes   Home Care and Equipment/Supplies: Were home health services ordered? not applicable If so, what is the name of the agency? N/a  Has the agency set up a time to come to the patient's home? not applicable Were any new equipment or medical supplies ordered?  No What is the name of the medical supply agency? N/a Were you able to get the supplies/equipment? not applicable Do you have any questions related to the use of the equipment or supplies? No  Functional Questionnaire: (I = Independent and D = Dependent) ADLs: Information on ADL's not obtained as call for EMMI alert  Bathing/Dressing- n/a  Meal Prep- n/a  Eating- n/a  Maintaining continence- n/a  Transferring/Ambulation- n/a  Managing Meds- n/a  Follow up appointments reviewed:  PCP Hospital f/u appt confirmed? Yes  Scheduled to see Dr. Wynetta Emery on 06/18/22'@3'$ :42.  [facility] f/u appt confirmed? No  Are transportation arrangements needed? No  If their condition worsens, is the pt aware to call PCP or go to the Emergency Dept.? Yes Was the patient provided with contact information for the PCP's office or ED? Yes Was to pt encouraged to call back with questions or  concerns? Yes  SDOH assessments and interventions completed:   No  Care Coordination Interventions Activated:  Yes   Care Coordination Interventions:  Referred for Care Coordination Services:  Clinical Social Work   Encounter Outcome:  Pt. Visit Completed

## 2022-05-31 NOTE — Telephone Encounter (Signed)
Requested medication (s) are due for refill today: yes for alternative  Requested medication (s) are on the active medication list: yes  Last refill:  05/30/22  Future visit scheduled: yes  Notes to clinic:   Alternative Requested:MED NOT ON FORMULARY.      Requested Prescriptions  Pending Prescriptions Disp Refills   Insulin Glargine Solostar (LANTUS) 100 UNIT/ML Solostar Pen [Pharmacy Med Name: INSULIN GLARGINE SOLOSTAR U100]  11    Sig: INJECT 10 UNITS INTO THE SKIN DAILY. DISCONTINUE TRESIBA. WILL LAST 150 DAYS     Endocrinology:  Diabetes - Insulins Failed - 05/30/2022  6:45 PM      Failed - HBA1C is between 0 and 7.9 and within 180 days    HbA1c, POC (controlled diabetic range)  Date Value Ref Range Status  03/13/2022 8.3 (A) 0.0 - 7.0 % Final         Passed - Valid encounter within last 6 months    Recent Outpatient Visits           2 months ago Uncontrolled type 2 diabetes mellitus with hyperglycemia, without long-term current use of insulin [facility])   [place], [place], [name]   6 months ago Uncontrolled type 2 diabetes mellitus with hyperglycemia, without long-term current use of insulin [facility])   [place] [name], MD   9 months ago Controlled type 2 diabetes mellitus with complication, without long-term current use of insulin [facility])   [place] [name], MD   12 months ago Encounter for [employer] annual wellness exam   [place] [name], MD   1 year ago Laceration of left thumb without foreign body with damage to nail, initial encounter   [place], Enobong, MD       Future Appointments             In 2 weeks [name], MD [place]

## 2022-05-31 NOTE — Progress Notes (Signed)
Pt has been informed of medication change.

## 2022-06-03 ENCOUNTER — Telehealth: Payer: Self-pay | Admitting: Licensed Clinical Social Worker

## 2022-06-03 NOTE — Patient Outreach (Signed)
  Care Coordination   06/03/2022 Name: Amy Glass MRN: [id number] DOB: 09-08-1960   Care Coordination Outreach Attempts:  An unsuccessful telephone outreach was attempted today to offer the patient information about available care coordination services as a benefit of their health plan. A woman initially answered the phone and disconnected the call. LCSW made another attempt and call was sent to voicemail  Follow Up Plan:  Voicemail was full; therefore, LCSW was unable to leave a message. Additional outreach attempts will be made to offer the patient care coordination information and services.   Encounter Outcome:  No Answer  Care Coordination Interventions Activated:  No   Care Coordination Interventions:  No, not indicated    Christa See, MSW, [email or web address] Phone [phone] 4:26 PM

## 2022-06-04 ENCOUNTER — Other Ambulatory Visit: Payer: Self-pay | Admitting: Internal Medicine

## 2022-06-04 DIAGNOSIS — I10 Essential (primary) hypertension: Secondary | ICD-10-CM

## 2022-06-04 DIAGNOSIS — E1165 Type 2 diabetes mellitus with hyperglycemia: Secondary | ICD-10-CM

## 2022-06-04 NOTE — Telephone Encounter (Signed)
Need order for this brand test strips  Requested Prescriptions  Pending Prescriptions Disp Refills   ACCU-CHEK GUIDE test strip [Pharmacy Med Name: ACCU-CHEK GUIDE   Strip] 100 strip     Sig: USE AS DIRECTED TO TEST BLOOD SUGAR ONCE DAILY     Endocrinology: Diabetes - Testing Supplies Passed - 06/04/2022 12:50 PM      Passed - Valid encounter within last 12 months    Recent Outpatient Visits           2 months ago Uncontrolled type 2 diabetes mellitus with hyperglycemia, without long-term current use of insulin ([place])   [place], [place], [name]   6 months ago Uncontrolled type 2 diabetes mellitus with hyperglycemia, without long-term current use of insulin ([place])   [place] [name], MD   10 months ago Controlled type 2 diabetes mellitus with complication, without long-term current use of insulin ([place])   [place] [name], MD   1 year ago Encounter for Medicare annual wellness exam   [place] [name], MD   1 year ago Laceration of left thumb without foreign body with damage to nail, initial encounter   [place], [name], MD       Future Appointments             In 2 weeks [name], MD [place]            Signed Prescriptions Disp Refills   JANUMET 50-1000 MG tablet 90 tablet 2    Sig: TAKE [place]     Endocrinology:  Diabetes - Biguanide + DPP-4 Inhibitor Combos Failed - 06/04/2022 12:50 PM      Failed - HBA1C is between 0 and 7.9 and within 180 days    HbA1c, POC (controlled diabetic range)  Date Value Ref Range Status  03/13/2022 8.3 (A) 0.0 - 7.0 % Final         Failed - B12 Level in normal range and within 720 days    Vitamin B-12  Date Value Ref Range Status  12/02/2014 1,405 (H) 211 - 911 pg/mL Final          Passed - Cr in normal range and within 360 days    Creat  Date Value Ref Range Status  12/21/2015 0.56 0.50 - 1.05 mg/dL Final   Creatinine, Ser  Date Value Ref Range Status  05/28/2022 0.56 0.44 - 1.00 mg/dL Final         Passed - eGFR in normal range and within 360 days    GFR, Est African American  Date Value Ref Range Status  12/21/2015 >89 >=60 mL/min Final   GFR calc Af Amer  Date Value Ref Range Status  04/28/2020 109 >59 mL/min/1.73 Final    Comment:    **[facility] currently reports eGFR in compliance with the current**   recommendations of the [employer]. [facility] will   update reporting as new guidelines are published from the NKF-ASN   Task force.    GFR, Est Non African American  Date Value Ref Range Status  12/21/2015 >89 >=60 mL/min Final    Comment:      The estimated GFR is a calculation valid for adults (>=68 years old) that uses the CKD-EPI algorithm to adjust for age and sex. It is  not to be used for children, pregnant women, hospitalized patients,    patients on dialysis, or with rapidly changing kidney function. According to the NKDEP, eGFR >89 is normal, 60-89 shows mild impairment, 30-59 shows moderate impairment, 15-29 shows severe impairment and <15 is ESRD.      GFR, Estimated  Date Value Ref Range Status  05/28/2022 >60 >60 mL/min Final    Comment:    (NOTE) Calculated using the CKD-EPI Creatinine Equation (2021)    eGFR  Date Value Ref Range Status  02/26/2021 96 >59 mL/min/1.73 Final         Passed - Valid encounter within last 6 months    Recent Outpatient Visits           2 months ago Uncontrolled type 2 diabetes mellitus with hyperglycemia, without long-term current use of insulin ([place])   [place], [place], [name]   6 months ago Uncontrolled type 2 diabetes mellitus with hyperglycemia, without long-term current use of insulin ([place])   [place] [name], MD   10 months ago Controlled type 2 diabetes mellitus with complication, without long-term current use of insulin ([place])   [place] [name], MD   1 year ago Encounter for Medicare annual wellness exam   [place] [name], MD   1 year ago Laceration of left thumb without foreign body with damage to nail, initial encounter   [place], [name], MD       Future Appointments             In 2 weeks [name], MD [place] - CBC within normal limits and completed in the last 12 months    WBC  Date Value Ref Range Status  05/28/2022 7.5 4.0 - 10.5 K/uL Final   RBC  Date Value Ref Range Status  05/28/2022 4.92 3.87 - 5.11 MIL/uL Final   Hemoglobin  Date Value Ref Range Status  05/28/2022 13.7 12.0 - 15.0 g/dL Final  01/24/2021 12.4 11.1 - 15.9 g/dL Final   HCT  Date Value Ref Range Status  05/28/2022 40.3 36.0 - 46.0 % Final   Hematocrit  Date Value Ref Range Status  01/24/2021 39.3 34.0 - 46.6 % Final   MCHC  Date Value Ref Range Status  05/28/2022 34.0 30.0 - 36.0 g/dL Final   [facility]  Date Value Ref Range Status  05/28/2022 27.8 26.0 - 34.0 pg Final   MCV  Date Value Ref Range Status  05/28/2022 81.9 80.0 - 100.0 fL Final  01/24/2021 87 79 - 97 fL Final   No results found for: "PLTCOUNTKUC", "LABPLAT", "POCPLA" RDW  Date Value Ref Range Status  05/28/2022 14.9 11.5 - 15.5 % Final  01/24/2021 13.3 11.7 - 15.4 % Final         Refused Prescriptions Disp Refills   amLODipine (NORVASC) 5 MG tablet [Pharmacy Med Name: AMLODIPINE BESYLATE 5 MG Tablet] 90 tablet     Sig: TAKE 1 TABLET EVERY DAY     Cardiovascular: Calcium Channel Blockers 2 Failed - 06/04/2022 12:50 PM      Failed - Last BP in normal range    BP Readings from Last 1  Encounters:  05/28/22 (!) 137/100  Passed - Last Heart Rate in normal range    Pulse Readings from Last 1 Encounters:  05/28/22 (!) 110         Passed - Valid encounter within last 6 months    Recent Outpatient Visits           2 months ago Uncontrolled type 2 diabetes mellitus with hyperglycemia, without long-term current use of insulin [facility])   [place], [place], [name]   6 months ago Uncontrolled type 2 diabetes mellitus with hyperglycemia, without long-term current use of insulin [facility])   [place] [name], MD   10 months ago Controlled type 2 diabetes mellitus with complication, without long-term current use of insulin [facility])   [place] [name], MD   1 year ago Encounter for [employer] annual wellness exam   [place], MD   1 year ago Laceration of left thumb without foreign body with damage to nail, initial encounter   [place], Enobong, MD       Future Appointments             In 2 weeks [name], MD [place]

## 2022-06-04 NOTE — Telephone Encounter (Signed)
Requested Prescriptions  Pending Prescriptions Disp Refills   ACCU-CHEK GUIDE test strip [Pharmacy Med Name: ACCU-CHEK GUIDE   Strip] 100 strip     Sig: USE AS DIRECTED TO TEST BLOOD SUGAR ONCE DAILY     Endocrinology: Diabetes - Testing Supplies Passed - 06/04/2022 12:50 PM      Passed - Valid encounter within last 12 months    Recent Outpatient Visits           2 months ago Uncontrolled type 2 diabetes mellitus with hyperglycemia, without long-term current use of insulin (Amy Glass)   Amy Glass, [name], [name]   6 months ago Uncontrolled type 2 diabetes mellitus with hyperglycemia, without long-term current use of insulin (Amy Glass)   Amy Glass, Amy B, MD   10 months ago Controlled type 2 diabetes mellitus with complication, without long-term current use of insulin (Amy Glass)   [name] Amy Pier, MD   1 year ago Encounter for Medicare annual wellness exam   Amy Amy Pier, MD   1 year ago Laceration of left thumb without foreign body with damage to nail, initial encounter   Amy Glass, Amy Ferretti, MD       Future Appointments             In 2 weeks Amy Pier, MD [place] 50-1000 MG tablet [Pharmacy Med Name: JANUMET 50-1000 MG Tablet] 90 tablet 2    Sig: TAKE [place]     Endocrinology:  Diabetes - Biguanide + DPP-4 Inhibitor Combos Failed - 06/04/2022 12:50 PM      Failed - HBA1C is between 0 and 7.9 and within 180 days    HbA1c, POC (controlled diabetic range)  Date Value Ref Range Status  03/13/2022 8.3 (A) 0.0 - 7.0 % Final         Failed - B12 Level in normal range and within 720 days    Vitamin Glass-12  Date Value Ref Range Status  12/02/2014 1,405 (H) 211 - 911 pg/mL Final         Passed - Cr in  normal range and within 360 days    Creat  Date Value Ref Range Status  12/21/2015 0.56 0.50 - 1.05 mg/dL Final   Creatinine, Ser  Date Value Ref Range Status  05/28/2022 0.56 0.44 - 1.00 mg/dL Final         Passed - eGFR in normal range and within 360 days    GFR, Est African American  Date Value Ref Range Status  12/21/2015 >89 >=60 mL/min Final   GFR calc Af Amer  Date Value Ref Range Status  04/28/2020 109 >59 mL/min/1.73 Final    Comment:    **[facility] currently reports eGFR in compliance with the current**   recommendations of the [employer]. [facility] will   update reporting as new guidelines are published from the NKF-ASN   Task force.    GFR, Est Non African American  Date Value Ref Range Status  12/21/2015 >89 >=60 mL/min Final    Comment:      The estimated GFR is a calculation valid for adults (>=64 years old) that uses the CKD-EPI algorithm to adjust for age and sex. It is   not to be used  for children, pregnant women, hospitalized patients,    patients on dialysis, or with rapidly changing kidney function. According to the NKDEP, eGFR >89 is normal, 60-89 shows mild impairment, 30-59 shows moderate impairment, 15-29 shows severe impairment and <15 is ESRD.      GFR, Estimated  Date Value Ref Range Status  05/28/2022 >60 >60 mL/min Final    Comment:    (NOTE) Calculated using the CKD-EPI Creatinine Equation (2021)    eGFR  Date Value Ref Range Status  02/26/2021 96 >59 mL/min/1.73 Final         Passed - Valid encounter within last 6 months    Recent Outpatient Visits           2 months ago Uncontrolled type 2 diabetes mellitus with hyperglycemia, without long-term current use of insulin ([name])   Amy Glass, Amy Glass, [name]   6 months ago Uncontrolled type 2 diabetes mellitus with hyperglycemia, without long-term current use of insulin ([place])   Amy Glass Amy Pier, MD   10 months ago Controlled type 2 diabetes mellitus with complication, without long-term current use of insulin (Amy Glass)   [name] Amy Pier, MD   1 year ago Encounter for Medicare annual wellness exam   [name] Amy Pier, MD   1 year ago Laceration of left thumb without foreign body with damage to nail, initial encounter   Amy Glass, Amy Ferretti, MD       Future Appointments             In 2 weeks Amy Pier, MD [place] - CBC within normal limits and completed in the last 12 months    WBC  Date Value Ref Range Status  05/28/2022 7.5 4.0 - 10.5 K/uL Final   RBC  Date Value Ref Range Status  05/28/2022 4.92 3.87 - 5.11 MIL/uL Final   Hemoglobin  Date Value Ref Range Status  05/28/2022 13.7 12.0 - 15.0 g/dL Final  01/24/2021 12.4 11.1 - 15.9 g/dL Final   HCT  Date Value Ref Range Status  05/28/2022 40.3 36.0 - 46.0 % Final   Hematocrit  Date Value Ref Range Status  01/24/2021 39.3 34.0 - 46.6 % Final   MCHC  Date Value Ref Range Status  05/28/2022 34.0 30.0 - 36.0 g/dL Final   Amy Glass  Date Value Ref Range Status  05/28/2022 27.8 26.0 - 34.0 pg Final   MCV  Date Value Ref Range Status  05/28/2022 81.9 80.0 - 100.0 fL Final  01/24/2021 87 79 - 97 fL Final   No results found for: "PLTCOUNTKUC", "LABPLAT", "POCPLA" RDW  Date Value Ref Range Status  05/28/2022 14.9 11.5 - 15.5 % Final  01/24/2021 13.3 11.7 - 15.4 % Final         Refused Prescriptions Disp Refills   amLODipine (NORVASC) 5 MG tablet [Pharmacy Med Name: AMLODIPINE BESYLATE 5 MG Tablet] 90 tablet     Sig: TAKE 1 TABLET EVERY DAY     Cardiovascular: Calcium Channel Blockers 2 Failed - 06/04/2022 12:50 PM      Failed - Last BP in normal range    BP Readings from Last 1 Encounters:  05/28/22 (!) 137/100          Passed - Last  Heart Rate in normal range    Pulse Readings from Last 1 Encounters:  05/28/22 (!) 110         Passed - Valid encounter within last 6 months    Recent Outpatient Visits           2 months ago Uncontrolled type 2 diabetes mellitus with hyperglycemia, without long-term current use of insulin [facility])   Amy Glass, [name], [name]   6 months ago Uncontrolled type 2 diabetes mellitus with hyperglycemia, without long-term current use of insulin [facility])   [name] Amy Plumber B, MD   10 months ago Controlled type 2 diabetes mellitus with complication, without long-term current use of insulin [facility])   [place] Amy Pier, MD   1 year ago Encounter for [employer] annual wellness exam   [place], [place]   1 year ago Laceration of left thumb without foreign body with damage to nail, initial encounter   [place], Enobong, MD       Future Appointments             In 2 weeks Amy Pier, MD [place]

## 2022-06-04 NOTE — Telephone Encounter (Signed)
Requested Prescriptions  Pending Prescriptions Disp Refills   amLODipine (NORVASC) 5 MG tablet [Pharmacy Med Name: AMLODIPINE BESYLATE 5 MG Tablet] 90 tablet     Sig: TAKE 1 TABLET EVERY DAY     Cardiovascular: Calcium Channel Blockers 2 Failed - 06/04/2022 12:50 PM      Failed - Last BP in normal range    BP Readings from Last 1 Encounters:  05/28/22 (!) 137/100         Passed - Last Heart Rate in normal range    Pulse Readings from Last 1 Encounters:  05/28/22 (!) 110         Passed - Valid encounter within last 6 months    Recent Outpatient Visits           2 months ago Uncontrolled type 2 diabetes mellitus with hyperglycemia, without long-term current use of insulin ([place])   [place], [place], [name]   6 months ago Uncontrolled type 2 diabetes mellitus with hyperglycemia, without long-term current use of insulin ([place])   [place], [place] Laming B, MD   10 months ago Controlled type 2 diabetes mellitus with complication, without long-term current use of insulin ([place])   [place] [name], MD   1 year ago Encounter for [employer] annual wellness exam   [place] [name], MD   1 year ago Laceration of left thumb without foreign body with damage to nail, initial encounter   [place], Enobong, MD       Future Appointments             In 2 weeks [name], MD [place] test strip [place] Med Name: ACCU-CHEK GUIDE   Strip] 100 strip     Sig: USE AS DIRECTED TO TEST BLOOD SUGAR ONCE DAILY     Endocrinology: Diabetes - Testing Supplies Passed - 06/04/2022 12:50 PM      Passed - Valid encounter within last 12 months    Recent Outpatient Visits           2 months ago Uncontrolled type 2 diabetes mellitus with  hyperglycemia, without long-term current use of insulin [facility])   [place], [place], [name]   6 months ago Uncontrolled type 2 diabetes mellitus with hyperglycemia, without long-term current use of insulin ([place])   [name], MD   10 months ago Controlled type 2 diabetes mellitus with complication, without long-term current use of insulin ([place])   [place] [name], MD   1 year ago Encounter for [employer] annual wellness exam   [place] [name], MD   1 year ago Laceration of left thumb without foreign body with damage to nail, initial encounter   [place], Enobong, MD       Future Appointments             In 2 weeks [name], MD [place] 50-1000 MG tablet [Pharmacy Med Name: JANUMET 50-1000 MG Tablet] 90 tablet 2    Sig:  TAKE 1 TABLET EVERY DAY     Endocrinology:  Diabetes - Biguanide + DPP-4 Inhibitor Combos Failed - 06/04/2022 12:50 PM      Failed - HBA1C is between 0 and 7.9 and within 180 days    HbA1c, POC (controlled diabetic range)  Date Value Ref Range Status  03/13/2022 8.3 (A) 0.0 - 7.0 % Final         Failed - B12 Level in normal range and within 720 days    Vitamin B-12  Date Value Ref Range Status  12/02/2014 1,405 (H) 211 - 911 pg/mL Final         Passed - Cr in normal range and within 360 days    Creat  Date Value Ref Range Status  12/21/2015 0.56 0.50 - 1.05 mg/dL Final   Creatinine, Ser  Date Value Ref Range Status  05/28/2022 0.56 0.44 - 1.00 mg/dL Final         Passed - eGFR in normal range and within 360 days    GFR, Est African American  Date Value Ref Range Status  12/21/2015 >89 >=60 mL/min Final   GFR calc Af Amer  Date Value Ref Range Status  04/28/2020 109 >59  mL/min/1.73 Final    Comment:    **[facility] currently reports eGFR in compliance with the current**   recommendations of the [employer]. [facility] will   update reporting as new guidelines are published from the NKF-ASN   Task force.    GFR, Est Non African American  Date Value Ref Range Status  12/21/2015 >89 >=60 mL/min Final    Comment:      The estimated GFR is a calculation valid for adults (>=52 years old) that uses the CKD-EPI algorithm to adjust for age and sex. It is   not to be used for children, pregnant women, hospitalized patients,    patients on dialysis, or with rapidly changing kidney function. According to the NKDEP, eGFR >89 is normal, 60-89 shows mild impairment, 30-59 shows moderate impairment, 15-29 shows severe impairment and <15 is ESRD.      GFR, Estimated  Date Value Ref Range Status  05/28/2022 >60 >60 mL/min Final    Comment:    (NOTE) Calculated using the CKD-EPI Creatinine Equation (2021)    eGFR  Date Value Ref Range Status  02/26/2021 96 >59 mL/min/1.73 Final         Passed - Valid encounter within last 6 months    Recent Outpatient Visits           2 months ago Uncontrolled type 2 diabetes mellitus with hyperglycemia, without long-term current use of insulin [facility])   [place], [place], [name]   6 months ago Uncontrolled type 2 diabetes mellitus with hyperglycemia, without long-term current use of insulin [facility])   [place] [name], MD   10 months ago Controlled type 2 diabetes mellitus with complication, without long-term current use of insulin [facility])   [place] [name], MD   1 year ago Encounter for [employer] annual wellness exam   [place] [name], MD   1 year ago Laceration of left thumb without foreign body with damage to nail, initial encounter   [place], Enobong, MD       Future Appointments  In 2 weeks [name], MD [place]            Passed - CBC within normal limits and completed in the last 12 months    WBC  Date Value Ref Range Status  05/28/2022 7.5 4.0 - 10.5 K/uL Final   RBC  Date Value Ref Range Status  05/28/2022 4.92 3.87 - 5.11 MIL/uL Final   Hemoglobin  Date Value Ref Range Status  05/28/2022 13.7 12.0 - 15.0 g/dL Final  01/24/2021 12.4 11.1 - 15.9 g/dL Final   HCT  Date Value Ref Range Status  05/28/2022 40.3 36.0 - 46.0 % Final   Hematocrit  Date Value Ref Range Status  01/24/2021 39.3 34.0 - 46.6 % Final   MCHC  Date Value Ref Range Status  05/28/2022 34.0 30.0 - 36.0 g/dL Final   [facility]  Date Value Ref Range Status  05/28/2022 27.8 26.0 - 34.0 pg Final   MCV  Date Value Ref Range Status  05/28/2022 81.9 80.0 - 100.0 fL Final  01/24/2021 87 79 - 97 fL Final   No results found for: "PLTCOUNTKUC", "LABPLAT", "POCPLA" RDW  Date Value Ref Range Status  05/28/2022 14.9 11.5 - 15.5 % Final  01/24/2021 13.3 11.7 - 15.4 % Final

## 2022-06-05 DIAGNOSIS — F331 Major depressive disorder, recurrent, moderate: Secondary | ICD-10-CM | POA: Diagnosis not present

## 2022-06-05 DIAGNOSIS — F411 Generalized anxiety disorder: Secondary | ICD-10-CM | POA: Diagnosis not present

## 2022-06-05 DIAGNOSIS — F900 Attention-deficit hyperactivity disorder, predominantly inattentive type: Secondary | ICD-10-CM | POA: Diagnosis not present

## 2022-06-07 ENCOUNTER — Other Ambulatory Visit: Payer: Self-pay

## 2022-06-07 ENCOUNTER — Encounter: Payer: Self-pay | Admitting: *Deleted

## 2022-06-07 ENCOUNTER — Encounter: Payer: Medicare HMO | Admitting: *Deleted

## 2022-06-07 DIAGNOSIS — Z006 Encounter for examination for normal comparison and control in clinical research program: Secondary | ICD-10-CM

## 2022-06-07 MED ORDER — STUDY - ESSENCE - OLEZARSEN 50 MG, 80 MG OR PLACEBO SQ INJECTION (PI-HILTY)
80.0000 mg | INJECTION | SUBCUTANEOUS | Status: DC
  Administered 2022-06-07: 80 mg via SUBCUTANEOUS
  Filled 2022-06-07: qty 0.8

## 2022-06-07 NOTE — Research (Addendum)
Amy [name] Amy [name] 07-Jun-2022   Urine Chemistry: Albumin Creatinine Ratio 42 mg/g               [] Clinically Significant  [x] Not Clinically Significant Urine Albumin 4.04 mg/dL mg/dL                  [] Clinically Significant  [x] Not Clinically Significant     Any further action needed to be taken per the PI?  No  Amy Nose, MD, [facility], FACP  [place]  [facility]  Medical Director of the Advanced Lipid Disorders &  Cardiovascular Risk Reduction Clinic Diplomate of the American Board of Clinical Lipidology Attending Cardiologist  Direct Dial: [phone]  Fax: [phone]  Website:  [email or web address]    Amy [name] Amy [name] 07-Jun-2022          Chemistry: Creatinine  0.48   mg/dL                                 [] Clinically Significant  [x] Not Clinically Significant Glucose 173  mg/dL                                       [] Clinically Significant  [x] Not Clinically Significant Magnesium    1.7 mg/dL                                 [x] Clinically Significant  [] Not Clinically Significant ALT/SGPT 71 U/L                                           [] Clinically Significant  [x] Not Clinically Significant AST/SGOT 59 U/L                                          [] Clinically Significant  [x] Not Clinically Significant Gamma Glutamyl Transfers (GGT) 269 U/L   [] Clinically Significant  [x] Not Clinically Significant Hs-C-Reactive Protein 8.0 mg/L                     [] Clinically Significant  [x] Not Clinically Significant    Amylase 142 U/L                                            [] Clinically Significant  [x] Not Clinically Significant   Hemoglobin A1c 10.1 %                                 [x] Clinically Significant  [] Not Clinically Significant    Hematology: MCV 81.9 fL                                        [] Clinically Significant  [x] Not Clinically Significant RDW 16.0 %                                        []   Clinically Significant  [x] Not Clinically Significant                        Urinalysis: Protein 20 mg/dL                                         [] Clinically Significant  [x] Not Clinically Significant   Leukocyte Esterase 500                              [] Clinically Significant  [x] Not Clinically Significant   Urinary White Blood cells 16-29                  [] Clinically Significant  [x] Not Clinically Significant   Uric Acid crystals present                            [] Clinically Significant  [x] Not Clinically Significant   Squamous Epithelial Cells 3-5                     [] Clinically Significant  [x] Not Clinically Significant Mucus 1+                                                     [] Clinically Significant  [x] Not Clinically Significant      She is still taking Magnesium 400 mg daily Lantus 10 units daily was added to her meds 05-30-22  Any further action needed to be taken per the PI?  No  Sounds like magnesium (which is low) is being addressed. A1C is high, but insulin adjusted.  Amy Nose, MD, [facility], FACP  [place]  [facility]  Medical Director of the Advanced Lipid Disorders &  Cardiovascular Risk Reduction Clinic Diplomate of the American Board of Clinical Lipidology Attending Cardiologist  Direct Dial: [phone]  Fax: [phone]  Website:  [email or web address]      TREATMENT DAY [name] - STUDY WEEK-25    Subject Number: [id number]             Randomization Number: [id number]            Date: 07-Jun-2022      [x] Vital Signs Collected - Blood Pressure: 139/90 - Height:4 ft 11 in - Weight:139 lbs - Heart Rate:85 - Respiratory Rate:16 - Temperature: 98.0 - Oxygen Saturation:99%  [x]  Physical Exam Completed by PI or SUB-I  [x]  Extended Urinalysis   [x]  Lab collection per protocol  [x]  (abdominal pain only) since last visit  [x]  Assessment of ER Visits, Hospitalizations, and Inpatient Days  [x]  Adverse Events and Concomitant  Medications  [x]  Diet, Lifestyle, and Alcohol Counseling   [x]  Study Drug: [place] Injection   Amy [name] is here for Week 25 of Amy research. She reports feeling good, no abd pain, She did go to the Ed 05-28-22 because her Blood sugars were running high. She states Dr Laural Benes told her to go. Per Amy [name] she left the ED. Lantus insulin was added to her medications. She is now taking 10 units daily. VS taken at 0935, Blood drawn at 0943, Urine obtained at 1000. Dr Riley Kill did  exam. Injection was given in right lower abd. Kit number [id number] Tol well. Next visit scheduled for Dec 15 at 0930.

## 2022-06-07 NOTE — Progress Notes (Signed)
Very nice patient here for mid way follow up visit.  Overall is doing well, but was in the ER last week with elevated sugar.  Because of long delays, she returned home without being seen.  Her sugars were markedly elevated.  They have improved with the addition of Lantus by her primary care MD.  They remain somewhat elevated with a fasting glucose today of '190mg'$ /dl per her own report.  She will continue to communicate with her primary.    She has not had any noticeable problems related to IP.  We briefly reviewed the potential benefits.  No ongoing issues.   Exam  Alert and oriented in NAD BP 139/90 P85 T 64F O2 sat 99% R16 unlabored No JVD.  Carotids brisk Lungs clear Cor regular without obvious murmur Abd - soft without tenderness Ext - no edema  Pulses intact.   Impression  !.  Continuation in the ESSENCE trial.    Loretha Brasil. Lia Foyer, MD, [place] Director, [facility]

## 2022-06-08 ENCOUNTER — Telehealth: Payer: Self-pay | Admitting: *Deleted

## 2022-06-08 NOTE — Patient Outreach (Signed)
  Care Coordination   06/08/2022 Name: Amy Glass MRN: [id number] DOB: May 05, 1961   Care Coordination Outreach Attempts:  An unsuccessful telephone outreach was attempted today to offer the patient information about available care coordination services as a benefit of their health plan.   Follow Up Plan:  Additional outreach attempts will be made to offer the patient care coordination information and services.   Encounter Outcome:  No Answer  Care Coordination Interventions Activated:  No   Care Coordination Interventions:  No, not indicated   Eduard Clos MSW, LCSW Licensed Clinical Social Worker      [phone]

## 2022-06-11 ENCOUNTER — Telehealth: Payer: Self-pay | Admitting: *Deleted

## 2022-06-11 NOTE — Patient Outreach (Signed)
error 

## 2022-06-12 NOTE — Research (Addendum)
[name] [facility] [phone] 07-Jun-2022

## 2022-06-18 ENCOUNTER — Ambulatory Visit: Payer: Medicare HMO | Attending: Internal Medicine | Admitting: Internal Medicine

## 2022-06-18 ENCOUNTER — Encounter: Payer: Self-pay | Admitting: Internal Medicine

## 2022-06-18 ENCOUNTER — Other Ambulatory Visit: Payer: Self-pay | Admitting: Internal Medicine

## 2022-06-18 VITALS — BP 120/86 | HR 99 | Temp 98.8°F | Ht 59.0 in | Wt 139.0 lb

## 2022-06-18 DIAGNOSIS — E1165 Type 2 diabetes mellitus with hyperglycemia: Secondary | ICD-10-CM

## 2022-06-18 DIAGNOSIS — J069 Acute upper respiratory infection, unspecified: Secondary | ICD-10-CM

## 2022-06-18 DIAGNOSIS — E1159 Type 2 diabetes mellitus with other circulatory complications: Secondary | ICD-10-CM

## 2022-06-18 DIAGNOSIS — I152 Hypertension secondary to endocrine disorders: Secondary | ICD-10-CM

## 2022-06-18 LAB — GLUCOSE, POCT (MANUAL RESULT ENTRY): POC Glucose: 146 mg/dl — AB (ref 70–99)

## 2022-06-18 LAB — POCT GLYCOSYLATED HEMOGLOBIN (HGB A1C): HbA1c, POC (controlled diabetic range): 9.6 % — AB (ref 0.0–7.0)

## 2022-06-18 MED ORDER — DEXCOM G7 RECEIVER DEVI: 0 refills | Status: DC

## 2022-06-18 MED ORDER — ACCU-CHEK GUIDE VI STRP: ORAL_STRIP | 3 refills | Status: DC

## 2022-06-18 MED ORDER — JANUMET 50-1000 MG PO TABS: 1.0000 | ORAL_TABLET | Freq: Two times a day (BID) | ORAL | 4 refills | Status: DC

## 2022-06-18 MED ORDER — LANTUS SOLOSTAR 100 UNIT/ML ~~LOC~~ SOPN: 15.0000 [IU] | PEN_INJECTOR | Freq: Every day | SUBCUTANEOUS | 11 refills | Status: DC

## 2022-06-18 MED ORDER — DEXCOM G7 SENSOR MISC: 12 refills | Status: DC

## 2022-06-18 MED ORDER — BENZONATATE 100 MG PO CAPS: 100.0000 mg | ORAL_CAPSULE | Freq: Two times a day (BID) | ORAL | 0 refills | Status: DC | PRN

## 2022-06-18 MED ORDER — ACCU-CHEK GUIDE W/DEVICE KIT: PACK | 0 refills | Status: AC

## 2022-06-18 MED ORDER — ACCU-CHEK SOFTCLIX LANCETS MISC: 3 refills | Status: AC
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
## 2022-06-18 NOTE — Patient Instructions (Signed)
Increase Glargine insulin to 15 units daily.   I have sent prescription to your pharmacy for Dexcom device.

## 2022-06-19 ENCOUNTER — Telehealth: Payer: Self-pay | Admitting: *Deleted

## 2022-06-19 DIAGNOSIS — E7800 Pure hypercholesterolemia, unspecified: Secondary | ICD-10-CM | POA: Diagnosis not present

## 2022-06-19 DIAGNOSIS — Z6828 Body mass index (BMI) 28.0-28.9, adult: Secondary | ICD-10-CM | POA: Diagnosis not present

## 2022-06-19 DIAGNOSIS — F1120 Opioid dependence, uncomplicated: Secondary | ICD-10-CM | POA: Diagnosis not present

## 2022-06-19 DIAGNOSIS — I10 Essential (primary) hypertension: Secondary | ICD-10-CM | POA: Diagnosis not present

## 2022-06-19 DIAGNOSIS — E1165 Type 2 diabetes mellitus with hyperglycemia: Secondary | ICD-10-CM | POA: Diagnosis not present

## 2022-06-19 DIAGNOSIS — M5416 Radiculopathy, lumbar region: Secondary | ICD-10-CM | POA: Diagnosis not present

## 2022-06-19 DIAGNOSIS — R030 Elevated blood-pressure reading, without diagnosis of hypertension: Secondary | ICD-10-CM | POA: Diagnosis not present

## 2022-06-19 DIAGNOSIS — Z79899 Other long term (current) drug therapy: Secondary | ICD-10-CM | POA: Diagnosis not present

## 2022-06-19 DIAGNOSIS — Z0130 Encounter for examination of blood pressure without abnormal findings: Secondary | ICD-10-CM | POA: Diagnosis not present

## 2022-06-19 NOTE — Telephone Encounter (Signed)
Office will be closed the afternoon of her appointment. Calling to switch to 12/26 if agreeable.    LVM requesting a call back.

## 2022-06-20 LAB — NOVEL CORONAVIRUS, NAA: SARS-CoV-2, NAA: NOT DETECTED

## 2022-06-21 ENCOUNTER — Telehealth: Payer: Self-pay

## 2022-06-21 NOTE — Telephone Encounter (Signed)
Pt given lab results per notes of Dr. Wynetta Emery on 06/21/22. Pt verbalized understanding.

## 2022-06-25 DIAGNOSIS — Z79899 Other long term (current) drug therapy: Secondary | ICD-10-CM | POA: Diagnosis not present

## 2022-06-28 NOTE — Telephone Encounter (Signed)
Rescheduled 07/08/22

## 2022-07-03 ENCOUNTER — Other Ambulatory Visit (HOSPITAL_BASED_OUTPATIENT_CLINIC_OR_DEPARTMENT_OTHER): Payer: Self-pay | Admitting: Internal Medicine

## 2022-07-03 DIAGNOSIS — E781 Pure hyperglyceridemia: Secondary | ICD-10-CM

## 2022-07-04 NOTE — Progress Notes (Unsigned)
Message sent to remind Amy Glass of her appointment tomorrow with research at 0930, also gave parking code.

## 2022-07-05 ENCOUNTER — Other Ambulatory Visit: Payer: Self-pay

## 2022-07-05 ENCOUNTER — Encounter: Payer: Medicare HMO | Admitting: *Deleted

## 2022-07-05 DIAGNOSIS — Z006 Encounter for examination for normal comparison and control in clinical research program: Secondary | ICD-10-CM

## 2022-07-05 MED ORDER — STUDY - ESSENCE - OLEZARSEN 50 MG, 80 MG OR PLACEBO SQ INJECTION (PI-HILTY)
80.0000 mg | INJECTION | SUBCUTANEOUS | Status: DC
  Administered 2022-07-05: 80 mg via SUBCUTANEOUS
  Filled 2022-07-05: qty 0.8

## 2022-07-05 NOTE — Research (Addendum)
TREATMENT DAY 197 - STUDY WEEK 29   Subject Number: [id number]            Randomization Number:[id number]           Date: 05-Jul-2022      [x] Vital Signs Collected - Blood Pressure:149/79 - Heart Rate:82 - Respiratory Rate:18 - Temperature:97.7 - Oxygen Saturation:100%  [x]  (abdominal pain only) since last visit  [x]  Assessment of ER Visits, Hospitalizations, and Inpatient Days  [x]  Adverse Events and Concomitant Medications  [x]  Diet, Lifestyle, and Alcohol Counseling   [x]  Study Drug: [employer] Injection   Amy Glass is here for Week 29 Day 197 of [facility] research study. She reports no changes in her medications, no visits to the Ed or urgent care, and no abd pain. VS taken at 0926, injection given at 1000 in left lower abd to well. Kit number [id number]  Next visit scheduled for Jan 11 at 0930.   Current Outpatient Medications:    Accu-Chek Softclix Lancets lancets, Use to check blood sugar TID. E11.65, Disp: 100 each, Rfl: 3   amLODipine (NORVASC) 10 MG tablet, TAKE 1 TABLET EVERY DAY, Disp: 90 tablet, Rfl: 1   amphetamine-dextroamphetamine (ADDERALL) 20 MG tablet, Take 20 mg by mouth 2 (two) times daily., Disp: , Rfl:    ARIPiprazole (ABILIFY) 2 MG tablet, Take 2 mg by mouth daily., Disp: , Rfl:    aspirin EC 81 MG tablet, Take 81 mg by mouth daily. Swallow whole., Disp: , Rfl:    atorvastatin (LIPITOR) 20 MG tablet, TAKE 1 TABLET EVERY DAY, Disp: 90 tablet, Rfl: 1   benzonatate (TESSALON) 100 MG capsule, Take 1 capsule (100 mg total) by mouth 2 (two) times daily as needed for cough., Disp: 20 capsule, Rfl: 0   Blood Glucose Monitoring Suppl (ACCU-CHEK GUIDE) w/Device KIT, Use to check blood sugar TID. E11.65, Disp: 1 kit, Rfl: 0   Continuous Blood Gluc Receiver (DEXCOM G7 RECEIVER) DEVI, UAD, Disp: 1 each, Rfl: 0   Continuous Blood Gluc Sensor (DEXCOM G7 SENSOR) MISC, Change device once every 10 days, Disp: 3 each, Rfl: 12   desvenlafaxine (PRISTIQ) 50 MG 24 hr tablet, Take 50 mg by  mouth daily., Disp: , Rfl:    fexofenadine (ALLEGRA) 180 MG tablet, Take 1 tablet (180 mg total) by mouth daily. (Patient taking differently: Take 180 mg by mouth daily as needed for allergies.), Disp: 90 tablet, Rfl: 1   glucose blood (ACCU-CHEK GUIDE) test strip, Use to check blood sugar TID. E11.65, Disp: 100 each, Rfl: 3   hydrOXYzine (ATARAX) 25 MG tablet, Take 25 mg by mouth daily., Disp: , Rfl:    insulin glargine (LANTUS SOLOSTAR) 100 UNIT/ML Solostar Pen, Inject 15 Units into the skin daily. Discontinue Evaristo Bury, Disp: 15 mL, Rfl: 11   Insulin Pen Needle (PEN NEEDLES) 32G X 4 MM MISC, Use to inject insulin once daily., Disp: 100 each, Rfl: 1   losartan (COZAAR) 100 MG tablet, TAKE 1 TABLET EVERY DAY, Disp: 90 tablet, Rfl: 1   Magnesium 400 MG CAPS, Take 400 mg by mouth daily., Disp: 30 capsule, Rfl: 1   Multiple Vitamins-Minerals (MULTIVITAMIN WITH MINERALS) tablet, Take 1 tablet by mouth daily., Disp: , Rfl:    nystatin cream (MYCOSTATIN), Apply 1 application topically 2 (two) times daily. (Patient taking differently: Apply 1 application  topically 2 (two) times daily as needed for dry skin.), Disp: 30 g, Rfl: 2   Olopatadine HCl (PATADAY) 0.2 % SOLN, Place 1 drop into both  eyes daily as needed (allergies)., Disp: 2.5 mL, Rfl: 5   omeprazole (PRILOSEC) 40 MG capsule, Take 1 capsule (40 mg total) by mouth daily., Disp: 90 capsule, Rfl: 3   sitaGLIPtin-metformin (JANUMET) 50-1000 MG tablet, Take 1 tablet by mouth 2 (two) times daily with a meal., Disp: 180 tablet, Rfl: 4   Study - ESSENCE - olezarsen 50 mg, 80 mg or placebo SQ injection (PI-Hilty), Inject 80 mg into the skin every 28 (twenty-eight) days. For Investigational Use Only. Injection subcutaneously in protocol approved injection sites (abdomen, thigh or outer area of upper arm) every 4 weeks in clinic. Please contact  [facility] for any questions or concerns regarding this medication., Disp: , Rfl:     traMADol-acetaminophen (ULTRACET) 37.5-325 MG tablet, , Disp: , Rfl:    traZODone (DESYREL) 100 MG tablet, Take 1 tablet (100 mg total) by mouth at bedtime as needed for sleep. (Patient taking differently: Take 100 mg by mouth at bedtime.), Disp: 90 tablet, Rfl: 2   VASCEPA 1 g capsule, TAKE 2 CAPSULES TWICE DAILY, Disp: 360 capsule, Rfl: 3  Current Facility-Administered Medications:    Study - ESSENCE - olezarsen 50 mg, 80 mg or placebo SQ injection (PI-Hilty), 80 mg, Subcutaneous, Q28 days, Hilty, Lisette Abu, MD

## 2022-07-08 ENCOUNTER — Ambulatory Visit: Payer: Self-pay | Admitting: *Deleted

## 2022-07-09 NOTE — Patient Outreach (Signed)
  Care Coordination   Initial Visit Note 07/08/22 Late Entry  07/09/2022 Name: Edana Aguado MRN: [id number] DOB: Jul 09, 1961  Carmelle Bamberg is a 61 y.o. year old female who sees [name], MD for primary care. I spoke with  [name] by phone today.  What matters to the patients health and wellness today?  Health and mental health wellness.    Goals Addressed             This Visit's Progress    Reduce depression, "sadness"       Care Coordination Interventions: Initial call with pt on 07/08/22. Pt acknowledges history of depression- pt reports she is active with Margreta Journey, counselor, who she sees weekly. Pt was in ED recently with high BS and says she left after waiting 8 hours. Per pt, her medications for depression were recently increased. She denies SI/HI and scored "11" on depression screening. Pt works, lives with her daughter, drives and is well supported.  Depression screen reviewed  PHQ2/ PHQ9 completed Solution-Focused Strategies employed:  Active listening / Reflection utilized  Emotional Support Provided Pt agrees to a follow up call next month for check-in  Pt has crisis line #'s          SDOH assessments and interventions completed:  Yes  SDOH Interventions Today    Flowsheet Row Most Recent Value  SDOH Interventions   Transportation Interventions Intervention Not Indicated  Utilities Interventions Intervention Not Indicated  Depression Interventions/Treatment  Currently on Treatment        Care Coordination Interventions:  Yes, provided   Follow up plan: Follow up call scheduled for 08/06/21    Encounter Outcome:  Pt. Visit Completed

## 2022-07-09 NOTE — Patient Instructions (Signed)
Visit Information  Thank you for taking time to visit with me today. Please don't hesitate to contact me if I can be of assistance to you.   Following are the goals we discussed today:   Goals Addressed             This Visit's Progress    Reduce depression, "sadness"       Care Coordination Interventions: Initial call with pt on 07/08/22. Pt acknowledges history of depression- pt reports she is active with Margreta Journey, counselor, who she sees weekly. Pt was in ED recently with high BS and says she left after waiting 8 hours. Per pt, her medications for depression were recently increased. She denies SI/HI and scored "11" on depression screening. Pt works, lives with her daughter, drives and is well supported.  Depression screen reviewed  PHQ2/ PHQ9 completed Solution-Focused Strategies employed:  Active listening / Reflection utilized  Emotional Support Provided Pt agrees to a follow up call next month for check-in  Pt has crisis line #'s          Our next appointment is by telephone on 08/06/22  Please call the care guide team at [phone] if you need to cancel or reschedule your appointment.   If you are experiencing a Mental Health or [place] or need someone to talk to, please call the Suicide and Crisis Lifeline: 988 call the [place] National Suicide Prevention Lifeline: 805-264-5019 or TTY: (417)793-7770 TTY 310-788-2537) to talk to a trained counselor call 911   Patient verbalizes understanding of instructions and care plan provided today and agrees to view in [place]. Active MyChart status and patient understanding of how to access instructions and care plan via MyChart confirmed with patient.     Telephone follow up appointment with care management team member scheduled for: 08/06/22  Eduard Clos MSW, LCSW Licensed Clinical Social Worker    [phone]

## 2022-07-10 DIAGNOSIS — F331 Major depressive disorder, recurrent, moderate: Secondary | ICD-10-CM | POA: Diagnosis not present

## 2022-07-10 DIAGNOSIS — F411 Generalized anxiety disorder: Secondary | ICD-10-CM | POA: Diagnosis not present

## 2022-07-10 DIAGNOSIS — F900 Attention-deficit hyperactivity disorder, predominantly inattentive type: Secondary | ICD-10-CM | POA: Diagnosis not present

## 2022-07-16 ENCOUNTER — Ambulatory Visit: Payer: Medicare HMO | Attending: Internal Medicine

## 2022-07-16 DIAGNOSIS — E2839 Other primary ovarian failure: Secondary | ICD-10-CM

## 2022-07-16 NOTE — Patient Instructions (Addendum)
Amy Glass , Thank you for taking time to come for your Medicare Wellness Visit. I appreciate your ongoing commitment to your health goals. Please review the following plan we discussed and let me know if I can assist you in the future.   These are the goals we discussed:  Goals      HEMOGLOBIN A1C < 7.0     Reduce depression, "sadness"     Care Coordination Interventions: Initial call with pt on 07/08/22. Pt acknowledges history of depression- pt reports she is active with Margreta Journey, counselor, who she sees weekly. Pt was in ED recently with high BS and says she left after waiting 8 hours. Per pt, her medications for depression were recently increased. She denies SI/HI and scored "11" on depression screening. Pt works, lives with her daughter, drives and is well supported.  Depression screen reviewed  PHQ2/ PHQ9 completed Solution-Focused Strategies employed:  Active listening / Reflection utilized  Emotional Support Provided Pt agrees to a follow up call next month for check-in  Pt has crisis line #'s          This is a list of the screening recommended for you and due dates:  Health Maintenance  Topic Date Due   Yearly kidney health urinalysis for diabetes  02/01/2022   Complete foot exam   02/20/2022   COVID-19 Vaccine (5 - 2023-24 season) 03/22/2022   Medicare Annual Wellness Visit  06/04/2022   Eye exam for diabetics  06/27/2022   Hemoglobin A1C  12/17/2022   Mammogram  02/24/2023   Yearly kidney function blood test for diabetes  05/29/2023   Pap Smear  07/29/2023   DTaP/Tdap/Td vaccine (2 - Td or Tdap) 12/27/2026   Colon Cancer Screening  05/30/2028   Flu Shot  Completed   Hepatitis C Screening: USPSTF Recommendation to screen - Ages 18-79 yo.  Completed   HIV Screening  Completed   Zoster (Shingles) Vaccine  Completed   HPV Vaccine  Aged Out  Health Maintenance, Female Adopting a healthy lifestyle and getting preventive care are important in promoting health and  wellness. Ask your health care provider about: The right schedule for you to have regular tests and exams. Things you can do on your own to prevent diseases and keep yourself healthy. What should I know about diet, weight, and exercise? Eat a healthy diet  Eat a diet that includes plenty of vegetables, fruits, low-fat dairy products, and lean protein. Do not eat a lot of foods that are high in solid fats, added sugars, or sodium. Maintain a healthy weight Body mass index (BMI) is used to identify weight problems. It estimates body fat based on height and weight. Your health care provider can help determine your BMI and help you achieve or maintain a healthy weight. Get regular exercise Get regular exercise. This is one of the most important things you can do for your health. Most adults should: Exercise for at least 150 minutes each week. The exercise should increase your heart rate and make you sweat (moderate-intensity exercise). Do strengthening exercises at least twice a week. This is in addition to the moderate-intensity exercise. Spend less time sitting. Even light physical activity can be beneficial. Watch cholesterol and blood lipids Have your blood tested for lipids and cholesterol at 60 years of age, then have this test every 5 years. Have your cholesterol levels checked more often if: Your lipid or cholesterol levels are high. You are older than 60 years of age. You are at  high risk for heart disease. What should I know about cancer screening? Depending on your health history and family history, you may need to have cancer screening at various ages. This may include screening for: Breast cancer. Cervical cancer. Colorectal cancer. Skin cancer. Lung cancer. What should I know about heart disease, diabetes, and high blood pressure? Blood pressure and heart disease High blood pressure causes heart disease and increases the risk of stroke. This is more likely to develop in people  who have high blood pressure readings or are overweight. Have your blood pressure checked: Every 3-5 years if you are 98-11 years of age. Every year if you are 65 years old or older. Diabetes Have regular diabetes screenings. This checks your fasting blood sugar level. Have the screening done: Once every three years after age 51 if you are at a normal weight and have a low risk for diabetes. More often and at a younger age if you are overweight or have a high risk for diabetes. What should I know about preventing infection? Hepatitis B If you have a higher risk for hepatitis B, you should be screened for this virus. Talk with your health care provider to find out if you are at risk for hepatitis B infection. Hepatitis C Testing is recommended for: Everyone born from [birth year] through 1964. Anyone with known risk factors for hepatitis C. Sexually transmitted infections (STIs) Get screened for STIs, including gonorrhea and chlamydia, if: You are sexually active and are younger than 60 years of age. You are older than 60 years of age and your health care provider tells you that you are at risk for this type of infection. Your sexual activity has changed since you were last screened, and you are at increased risk for chlamydia or gonorrhea. Ask your health care provider if you are at risk. Ask your health care provider about whether you are at high risk for HIV. Your health care provider may recommend a prescription medicine to help prevent HIV infection. If you choose to take medicine to prevent HIV, you should first get tested for HIV. You should then be tested every 3 months for as long as you are taking the medicine. Pregnancy If you are about to stop having your period (premenopausal) and you may become pregnant, seek counseling before you get pregnant. Take 400 to 800 micrograms (mcg) of folic acid every day if you become pregnant. Ask for birth control (contraception) if you want to prevent  pregnancy. Osteoporosis and menopause Osteoporosis is a disease in which the bones lose minerals and strength with aging. This can result in bone fractures. If you are 26 years old or older, or if you are at risk for osteoporosis and fractures, ask your health care provider if you should: Be screened for bone loss. Take a calcium or vitamin D supplement to lower your risk of fractures. Be given hormone replacement therapy (HRT) to treat symptoms of menopause. Follow these instructions at home: Alcohol use Do not drink alcohol if: Your health care provider tells you not to drink. You are pregnant, may be pregnant, or are planning to become pregnant. If you drink alcohol: Limit how much you have to: 0-1 drink a day. Know how much alcohol is in your drink. In the [place] one drink equals one 12 oz bottle of beer (355 mL), one 5 oz glass of wine (148 mL), or one 1 oz glass of hard liquor (44 mL). Lifestyle Do not use any products that contain nicotine or  tobacco. These products include cigarettes, chewing tobacco, and vaping devices, such as e-cigarettes. If you need help quitting, ask your health care provider. Do not use street drugs. Do not share needles. Ask your health care provider for help if you need support or information about quitting drugs. General instructions Schedule regular health, dental, and eye exams. Stay current with your vaccines. Tell your health care provider if: You often feel depressed. You have ever been abused or do not feel safe at home. Summary Adopting a healthy lifestyle and getting preventive care are important in promoting health and wellness. Follow your health care provider's instructions about healthy diet, exercising, and getting tested or screened for diseases. Follow your health care provider's instructions on monitoring your cholesterol and blood pressure. This information is not intended to replace advice given to you by your health care provider.  Make sure you discuss any questions you have with your health care provider. Document Revised: 11/27/2020 Document Reviewed: 11/27/2020 [employer] Patient Education  [place].
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
## 2022-07-22 ENCOUNTER — Other Ambulatory Visit: Payer: Self-pay | Admitting: Internal Medicine

## 2022-07-23 ENCOUNTER — Ambulatory Visit: Payer: Self-pay | Admitting: Pharmacist

## 2022-07-23 DIAGNOSIS — F900 Attention-deficit hyperactivity disorder, predominantly inattentive type: Secondary | ICD-10-CM | POA: Diagnosis not present

## 2022-07-23 DIAGNOSIS — F411 Generalized anxiety disorder: Secondary | ICD-10-CM | POA: Diagnosis not present

## 2022-07-23 DIAGNOSIS — F331 Major depressive disorder, recurrent, moderate: Secondary | ICD-10-CM | POA: Diagnosis not present

## 2022-07-23 NOTE — Telephone Encounter (Signed)
Requested medications are due for refill today.  yes  Requested medications are on the active medications list.  yes  Last refill. 09/27/2021 #90 1 rf  Future visit scheduled.   yes  Notes to clinic.  Labs are expired.    Requested Prescriptions  Pending Prescriptions Disp Refills   atorvastatin (LIPITOR) 20 MG tablet [Pharmacy Med Name: ATORVASTATIN CALCIUM 20 MG Tablet] 90 tablet 3    Sig: TAKE 1 TABLET EVERY DAY     Cardiovascular:  Antilipid - Statins Failed - 07/22/2022  4:13 PM      Failed - Lipid Panel in normal range within the last 12 months    Cholesterol, Total  Date Value Ref Range Status  01/24/2021 171 100 - 199 mg/dL Final   LDL Chol Calc (NIH)  Date Value Ref Range Status  01/24/2021 54 0 - 99 mg/dL Final   Direct LDL  Date Value Ref Range Status  09/21/2019 89.0 0 - 99 mg/dL Final    Comment:    Performed at [facility], [place] [address], [place], [place] [address]   HDL  Date Value Ref Range Status  01/24/2021 27 (L) >39 mg/dL Final   Triglycerides  Date Value Ref Range Status  01/24/2021 610 (HH) 0 - 149 mg/dL Final         Passed - Patient is not pregnant      Passed - Valid encounter within last 12 months    Recent Outpatient Visits           1 month ago Uncontrolled type 2 diabetes mellitus with hyperglycemia, without long-term current use of insulin ([place])   [place], Neoma Laming B, MD   4 months ago Uncontrolled type 2 diabetes mellitus with hyperglycemia, without long-term current use of insulin [facility])   [place], [place], [name]   8 months ago Uncontrolled type 2 diabetes mellitus with hyperglycemia, without long-term current use of insulin [facility])   [place] [name], MD   11 months ago Controlled type 2 diabetes mellitus with complication, without long-term current use of insulin [facility])   [place] [name], MD   1 year ago Encounter for [employer] annual wellness exam   [place], MD       Future Appointments             In 1 month Wynetta Emery, Dalbert Batman, MD [place]

## 2022-07-24 DIAGNOSIS — F411 Generalized anxiety disorder: Secondary | ICD-10-CM | POA: Diagnosis not present

## 2022-07-24 DIAGNOSIS — F900 Attention-deficit hyperactivity disorder, predominantly inattentive type: Secondary | ICD-10-CM | POA: Diagnosis not present

## 2022-07-24 DIAGNOSIS — F331 Major depressive disorder, recurrent, moderate: Secondary | ICD-10-CM | POA: Diagnosis not present

## 2022-07-27 ENCOUNTER — Other Ambulatory Visit: Payer: Self-pay | Admitting: Internal Medicine

## 2022-07-27 DIAGNOSIS — I152 Hypertension secondary to endocrine disorders: Secondary | ICD-10-CM

## 2022-07-29 NOTE — Telephone Encounter (Signed)
Requested Prescriptions  Pending Prescriptions Disp Refills   amLODipine (NORVASC) 10 MG tablet [Pharmacy Med Name: AMLODIPINE BESYLATE 10 MG Tablet] 90 tablet 0    Sig: TAKE 1 TABLET EVERY DAY     Cardiovascular: Calcium Channel Blockers 2 Failed - 07/27/2022  3:33 AM      Failed - Last BP in normal range    BP Readings from Last 1 Encounters:  07/05/22 (!) 149/79         Passed - Last Heart Rate in normal range    Pulse Readings from Last 1 Encounters:  07/05/22 82         Passed - Valid encounter within last 6 months    Recent Outpatient Visits           1 month ago Uncontrolled type 2 diabetes mellitus with hyperglycemia, without long-term current use of insulin ([place])   [place], Neoma Laming B, MD   4 months ago Uncontrolled type 2 diabetes mellitus with hyperglycemia, without long-term current use of insulin [facility])   [place], [place], [name]   8 months ago Uncontrolled type 2 diabetes mellitus with hyperglycemia, without long-term current use of insulin [facility])   [place] [name], MD   11 months ago Controlled type 2 diabetes mellitus with complication, without long-term current use of insulin [facility])   [place] [name], MD   1 year ago Encounter for [employer] annual wellness exam   [place], MD       Future Appointments             In 3 weeks [name], MD [place]

## 2022-08-05 ENCOUNTER — Encounter: Payer: Self-pay | Admitting: *Deleted

## 2022-08-05 DIAGNOSIS — F411 Generalized anxiety disorder: Secondary | ICD-10-CM | POA: Diagnosis not present

## 2022-08-05 DIAGNOSIS — F331 Major depressive disorder, recurrent, moderate: Secondary | ICD-10-CM | POA: Diagnosis not present

## 2022-08-05 DIAGNOSIS — F900 Attention-deficit hyperactivity disorder, predominantly inattentive type: Secondary | ICD-10-CM | POA: Diagnosis not present

## 2022-08-05 DIAGNOSIS — Z006 Encounter for examination for normal comparison and control in clinical research program: Secondary | ICD-10-CM

## 2022-08-05 NOTE — Research (Signed)
Message sent to remind Amy Glass of her appointment tomorrow at 0800, and gave her the parking code.

## 2022-08-06 ENCOUNTER — Ambulatory Visit: Payer: Self-pay | Admitting: *Deleted

## 2022-08-06 ENCOUNTER — Encounter: Payer: Medicare HMO | Admitting: *Deleted

## 2022-08-06 DIAGNOSIS — Z006 Encounter for examination for normal comparison and control in clinical research program: Secondary | ICD-10-CM

## 2022-08-06 MED ORDER — STUDY - ESSENCE - OLEZARSEN 50 MG, 80 MG OR PLACEBO SQ INJECTION (PI-HILTY)
80.0000 mg | INJECTION | SUBCUTANEOUS | Status: DC
  Administered 2022-08-06: 80 mg via SUBCUTANEOUS
  Filled 2022-08-06: qty 0.8

## 2022-08-06 NOTE — Research (Addendum)
TREATMENT DAY 225 - STUDY WEEK 33    Subject Number: [id number]              Randomization Number: [id number]             Date: 06-Aug-2022      [x] Vital Signs Collected - Blood Pressure:108/69 - Heart Rate:79 - Respiratory Rate:20 - Temperature:97.7 - Oxygen Saturation:99%   [x]   (abdominal pain only) since last visit  [x]  Assessment of ER Visits, Hospitalizations, and Inpatient Days  [x]  Adverse Events and Concomitant Medications  [x]  Diet, Lifestyle, and Alcohol Counseling   [x]  Study Drug: [place] Injection   Amy Glass is here for Week 33 Day 225 Essence visit. She reports no abd pain, and no visits to the Ed or urgent care since last seen.She reports no changes in her medications, Vs taken at 0805. Injection given in right lower abd at 0830. Kit number [id number]. Tol well. Scheduled next visit for Feb 5 at 0900.   Current Outpatient Medications:    Accu-Chek Softclix Lancets lancets, Use to check blood sugar TID. E11.65, Disp: 100 each, Rfl: 3   amLODipine (NORVASC) 10 MG tablet, TAKE 1 TABLET EVERY DAY, Disp: 90 tablet, Rfl: 0   amphetamine-dextroamphetamine (ADDERALL) 20 MG tablet, Take 20 mg by mouth 2 (two) times daily., Disp: , Rfl:    ARIPiprazole (ABILIFY) 2 MG tablet, Take 2 mg by mouth daily., Disp: , Rfl:    aspirin EC 81 MG tablet, Take 81 mg by mouth daily. Swallow whole., Disp: , Rfl:    atorvastatin (LIPITOR) 20 MG tablet, TAKE 1 TABLET EVERY DAY, Disp: 90 tablet, Rfl: 0   benzonatate (TESSALON) 100 MG capsule, Take 1 capsule (100 mg total) by mouth 2 (two) times daily as needed for cough., Disp: 20 capsule, Rfl: 0   Blood Glucose Monitoring Suppl (ACCU-CHEK GUIDE) w/Device KIT, Use to check blood sugar TID. E11.65, Disp: 1 kit, Rfl: 0   Continuous Blood Gluc Receiver (DEXCOM G7 RECEIVER) DEVI, UAD, Disp: 1 each, Rfl: 0   Continuous Blood Gluc Sensor (DEXCOM G7 SENSOR) MISC, Change device once every 10 days, Disp: 3 each, Rfl: 12   desvenlafaxine (PRISTIQ) 50 MG 24  hr tablet, Take 50 mg by mouth daily., Disp: , Rfl:    fexofenadine (ALLEGRA) 180 MG tablet, Take 1 tablet (180 mg total) by mouth daily. (Patient taking differently: Take 180 mg by mouth daily as needed for allergies.), Disp: 90 tablet, Rfl: 1   glucose blood (ACCU-CHEK GUIDE) test strip, Use to check blood sugar TID. E11.65, Disp: 100 each, Rfl: 3   hydrOXYzine (ATARAX) 25 MG tablet, Take 25 mg by mouth daily., Disp: , Rfl:    insulin glargine (LANTUS SOLOSTAR) 100 UNIT/ML Solostar Pen, Inject 15 Units into the skin daily. Discontinue Evaristo Bury, Disp: 15 mL, Rfl: 11   Insulin Pen Needle (PEN NEEDLES) 32G X 4 MM MISC, Use to inject insulin once daily., Disp: 100 each, Rfl: 1   losartan (COZAAR) 100 MG tablet, TAKE 1 TABLET EVERY DAY, Disp: 90 tablet, Rfl: 1   Magnesium 400 MG CAPS, Take 400 mg by mouth daily., Disp: 30 capsule, Rfl: 1   Multiple Vitamins-Minerals (MULTIVITAMIN WITH MINERALS) tablet, Take 1 tablet by mouth daily., Disp: , Rfl:    nystatin cream (MYCOSTATIN), Apply 1 application topically 2 (two) times daily. (Patient taking differently: Apply 1 application  topically 2 (two) times daily as needed for dry skin.), Disp: 30 g, Rfl: 2   Olopatadine HCl (  PATADAY) 0.2 % SOLN, Place 1 drop into both eyes daily as needed (allergies)., Disp: 2.5 mL, Rfl: 5   omeprazole (PRILOSEC) 40 MG capsule, Take 1 capsule (40 mg total) by mouth daily., Disp: 90 capsule, Rfl: 3   sitaGLIPtin-metformin (JANUMET) 50-1000 MG tablet, Take 1 tablet by mouth 2 (two) times daily with a meal., Disp: 180 tablet, Rfl: 4   Study - ESSENCE - olezarsen 50 mg, 80 mg or placebo SQ injection (PI-Hilty), Inject 80 mg into the skin every 28 (twenty-eight) days. For Investigational Use Only. Injection subcutaneously in protocol approved injection sites (abdomen, thigh or outer area of upper arm) every 4 weeks in clinic. Please contact  [facility] for any questions or concerns regarding this  medication., Disp: , Rfl:    traMADol-acetaminophen (ULTRACET) 37.5-325 MG tablet, , Disp: , Rfl:    traZODone (DESYREL) 100 MG tablet, Take 1 tablet (100 mg total) by mouth at bedtime as needed for sleep. (Patient taking differently: Take 100 mg by mouth at bedtime.), Disp: 90 tablet, Rfl: 2   VASCEPA 1 g capsule, TAKE 2 CAPSULES TWICE DAILY, Disp: 360 capsule, Rfl: 3  Current Facility-Administered Medications:    Study - ESSENCE - olezarsen 50 mg, 80 mg or placebo SQ injection (PI-Hilty), 80 mg, Subcutaneous, Q28 days, Chrystie Nose, MD, 80 mg at 08/06/22 0830

## 2022-08-06 NOTE — Patient Outreach (Signed)
  Care Coordination   Follow Up Visit Note   08/06/2022 Name: Amy Glass MRN: [id number] DOB: January 04, 1961  Amy Glass is a 62 y.o. year old female who sees Amy Pier, MD for primary care. I spoke with  Amy Glass by phone today.  What matters to the patients health and wellness today?  Having a hard time getting my house chores completed.    Goals Addressed   None     SDOH assessments and interventions completed:  Yes  SDOH Interventions Today    Flowsheet Row Most Recent Value  SDOH Interventions   Depression Interventions/Treatment  Medication        Care Coordination Interventions:  Yes, provided   Follow up plan: Follow up call scheduled for 08/22/22    Encounter Outcome:  Pt. Visit Completed

## 2022-08-06 NOTE — Patient Instructions (Signed)
Visit Information  Thank you for taking time to visit with me today. Please don't hesitate to contact me if I can be of assistance to you.   Following are the goals we discussed today:   Goals Addressed             This Visit's Progress    Reduce depression, "sadness"       Care Coordination Interventions: Pt reports doing somewhat better (thinks the medication is helping). She does; however, score higher today with PHQ9 Depression Screening- stating she feels overwhelmed and lacks motivation to get home tasks.chores completed. CSW made suggestions to pt to try small goals at getting the chores done; example used was to commit to 1 hour daily working on the tasks- or 30 minutes if 1 hour is not doable.  Pt continues to see her counselor, Margreta Journey, weekly- suggested she also talk with her about these concerns.  CSW acknowledged her feelings of "not wanting to get out of bed" and the impact the weather and "SAD" can play into this. Encouraged pt to find things she enjoys doing to help motivate her more.  Initial call with pt on 07/08/22 - pt acknowledged history of depression- pt reports she is active with Margreta Journey, counselor, who she sees weekly. Pt was in ED recently with high BS and says she left after waiting 8 hours. Per pt, her medications for depression were recently increased. She denies SI/HI and scored "11" on depression screening. Pt works, lives with her daughter, drives and is well supported.  Depression screen reviewed  PHQ2/ PHQ9 completed Solution-Focused Strategies employed:  Active listening / Reflection utilized  Emotional Support Provided Pt agrees to a follow up call next month for check-in  Pt has crisis line #'s          Our next appointment is by telephone on 08/22/22  Please call the care guide team at [phone] if you need to cancel or reschedule your appointment.   If you are experiencing a Mental Health or [place] or need someone to  talk to, please call the Suicide and Crisis Lifeline: 988 call 911   Patient verbalizes understanding of instructions and care plan provided today and agrees to view in [place]. Active MyChart status and patient understanding of how to access instructions and care plan via MyChart confirmed with patient.     Telephone follow up appointment with care management team member scheduled for: 08/22/22  Eduard Clos, MSW, [place] Worker [facility] [phone]

## 2022-08-14 DIAGNOSIS — M539 Dorsopathy, unspecified: Secondary | ICD-10-CM | POA: Diagnosis not present

## 2022-08-14 DIAGNOSIS — Z6828 Body mass index (BMI) 28.0-28.9, adult: Secondary | ICD-10-CM | POA: Diagnosis not present

## 2022-08-14 DIAGNOSIS — Z0130 Encounter for examination of blood pressure without abnormal findings: Secondary | ICD-10-CM | POA: Diagnosis not present

## 2022-08-14 DIAGNOSIS — Z79899 Other long term (current) drug therapy: Secondary | ICD-10-CM | POA: Diagnosis not present

## 2022-08-14 DIAGNOSIS — G629 Polyneuropathy, unspecified: Secondary | ICD-10-CM | POA: Diagnosis not present

## 2022-08-14 DIAGNOSIS — I10 Essential (primary) hypertension: Secondary | ICD-10-CM | POA: Diagnosis not present

## 2022-08-14 DIAGNOSIS — M5416 Radiculopathy, lumbar region: Secondary | ICD-10-CM | POA: Diagnosis not present

## 2022-08-14 DIAGNOSIS — E1165 Type 2 diabetes mellitus with hyperglycemia: Secondary | ICD-10-CM | POA: Diagnosis not present

## 2022-08-14 DIAGNOSIS — F1120 Opioid dependence, uncomplicated: Secondary | ICD-10-CM | POA: Diagnosis not present

## 2022-08-17 ENCOUNTER — Other Ambulatory Visit: Payer: Self-pay | Admitting: Internal Medicine

## 2022-08-17 DIAGNOSIS — I10 Essential (primary) hypertension: Secondary | ICD-10-CM

## 2022-08-19 ENCOUNTER — Other Ambulatory Visit: Payer: Self-pay | Admitting: Allergy

## 2022-08-19 DIAGNOSIS — F411 Generalized anxiety disorder: Secondary | ICD-10-CM | POA: Diagnosis not present

## 2022-08-19 DIAGNOSIS — F331 Major depressive disorder, recurrent, moderate: Secondary | ICD-10-CM | POA: Diagnosis not present

## 2022-08-19 DIAGNOSIS — F900 Attention-deficit hyperactivity disorder, predominantly inattentive type: Secondary | ICD-10-CM | POA: Diagnosis not present

## 2022-08-19 NOTE — Telephone Encounter (Signed)
Requested Prescriptions  Pending Prescriptions Disp Refills   losartan (COZAAR) 100 MG tablet [Pharmacy Med Name: LOSARTAN POTASSIUM 100 MG Tablet] 90 tablet 3    Sig: TAKE 1 TABLET EVERY DAY     Cardiovascular:  Angiotensin Receptor Blockers Passed - 08/17/2022  3:40 AM      Passed - Cr in normal range and within 180 days    Creat  Date Value Ref Range Status  12/21/2015 0.56 0.50 - 1.05 mg/dL Final   Creatinine, Ser  Date Value Ref Range Status  05/28/2022 0.56 0.44 - 1.00 mg/dL Final         Passed - K in normal range and within 180 days    Potassium  Date Value Ref Range Status  05/28/2022 4.5 3.5 - 5.1 mmol/L Final         Passed - Patient is not pregnant      Passed - Last BP in normal range    BP Readings from Last 1 Encounters:  08/06/22 108/69         Passed - Valid encounter within last 6 months    Recent Outpatient Visits           2 months ago Uncontrolled type 2 diabetes mellitus with hyperglycemia, without long-term current use of insulin ([place])   [place] [name], MD   5 months ago Uncontrolled type 2 diabetes mellitus with hyperglycemia, without long-term current use of insulin [facility])   [place], [place], [name]   9 months ago Uncontrolled type 2 diabetes mellitus with hyperglycemia, without long-term current use of insulin [facility])   [place] [name], MD   1 year ago Controlled type 2 diabetes mellitus with complication, without long-term current use of insulin [facility])   [place] [name], MD   1 year ago Encounter for [employer] annual wellness exam   [place], MD       Future Appointments             In 3 days [name], MD [place]

## 2022-08-22 ENCOUNTER — Encounter: Payer: Self-pay | Admitting: Internal Medicine

## 2022-08-22 ENCOUNTER — Ambulatory Visit: Payer: Self-pay | Admitting: *Deleted

## 2022-08-22 ENCOUNTER — Ambulatory Visit: Payer: Self-pay | Admitting: Internal Medicine

## 2022-08-22 ENCOUNTER — Ambulatory Visit: Payer: Medicare HMO | Attending: Internal Medicine | Admitting: Internal Medicine

## 2022-08-22 VITALS — BP 124/82 | HR 104 | Temp 98.3°F | Ht 59.0 in | Wt 144.0 lb

## 2022-08-22 DIAGNOSIS — I152 Hypertension secondary to endocrine disorders: Secondary | ICD-10-CM | POA: Diagnosis not present

## 2022-08-22 DIAGNOSIS — E781 Pure hyperglyceridemia: Secondary | ICD-10-CM

## 2022-08-22 DIAGNOSIS — Z794 Long term (current) use of insulin: Secondary | ICD-10-CM

## 2022-08-22 DIAGNOSIS — E1165 Type 2 diabetes mellitus with hyperglycemia: Secondary | ICD-10-CM | POA: Diagnosis not present

## 2022-08-22 DIAGNOSIS — E1159 Type 2 diabetes mellitus with other circulatory complications: Secondary | ICD-10-CM

## 2022-08-22 DIAGNOSIS — F319 Bipolar disorder, unspecified: Secondary | ICD-10-CM | POA: Diagnosis not present

## 2022-08-22 DIAGNOSIS — E663 Overweight: Secondary | ICD-10-CM | POA: Diagnosis not present

## 2022-08-22 MED ORDER — LANTUS SOLOSTAR 100 UNIT/ML ~~LOC~~ SOPN: 18.0000 [IU] | PEN_INJECTOR | Freq: Every day | SUBCUTANEOUS | 11 refills | Status: DC

## 2022-08-22 MED ORDER — FREESTYLE LIBRE 3 READER DEVI: 1.0000 | Freq: Every day | 0 refills | Status: AC

## 2022-08-22 MED ORDER — FREESTYLE LIBRE 3 SENSOR MISC: 6 refills | Status: DC
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
## 2022-08-22 NOTE — Patient Outreach (Signed)
  Care Coordination   Follow Up Visit Note   08/22/2022 Name: Amy Glass MRN: [id number] DOB: 1961/03/11  Amy Glass is a 62 y.o. year old female who sees [name], MD for primary care. I spoke with  Amy Glass by phone today.  What matters to the patients health and wellness today?  "Much better"    Goals Addressed             This Visit's Progress    Reduce depression, "sadness"       Care Coordination Interventions: Pt reports doing "much better" and continues with her medication which she feels is helping). She is planning to talk with her daughter about helping with the house chores and she feels overwhelmed and lacks motivation to get home tasks.chores completed. CSW made suggestions to pt to try small goals at getting the chores done; example used was to commit to 1 hour daily working on the tasks- or 30 minutes if 1 hour is not doable.  Pt continues to see her counselor, Margreta Journey, weekly- suggested she also talk with her about these concerns.  Initial call with pt on 07/08/22 - pt acknowledged history of depression- pt reports she is active with Margreta Journey, counselor, who she sees weekly. Pt was in ED recently with high BS and says she left after waiting 8 hours. Per pt, her medications for depression were recently increased. She denies SI/HI and scored "11" on depression screening. Pt works, lives with her daughter, drives and is well supported.  Depression screen reviewed  PHQ2/ PHQ9 completed Solution-Focused Strategies employed:  Active listening / Reflection utilized  Emotional Support Provided Pt agrees to a follow up call next month for check-in  Pt has crisis line #'s          SDOH assessments and interventions completed:  Yes     Care Coordination Interventions:  Yes, provided   Follow up plan: Follow up call scheduled for 09/16/22    Encounter Outcome:  Pt. Visit Completed

## 2022-08-22 NOTE — Patient Instructions (Signed)
Increase insulin to 18 units daily.  If after 3 days your morning blood sugars are still greater than 130, increase the insulin to 20 units daily. Try to get in some exercise by walking on your treadmill 3 to 5 days a week for 15 to 30 minutes. I have sent a prescription to your pharmacy for the continuous glucose monitor called the [place] device.  We will try to get this approved through your insurance.

## 2022-08-22 NOTE — Patient Instructions (Signed)
Visit Information  Thank you for taking time to visit with me today. Please don't hesitate to contact me if I can be of assistance to you.   Following are the goals we discussed today:   Goals Addressed             This Visit's Progress    Reduce depression, "sadness"       Care Coordination Interventions: Pt reports doing "much better" and continues with her medication which she feels is helping). She is planning to talk with her daughter about helping with the house chores and she feels overwhelmed and lacks motivation to get home tasks.chores completed. CSW made suggestions to pt to try small goals at getting the chores done; example used was to commit to 1 hour daily working on the tasks- or 30 minutes if 1 hour is not doable.  Pt continues to see her counselor, Margreta Journey, weekly- suggested she also talk with her about these concerns.  Initial call with pt on 07/08/22 - pt acknowledged history of depression- pt reports she is active with Margreta Journey, counselor, who she sees weekly. Pt was in ED recently with high BS and says she left after waiting 8 hours. Per pt, her medications for depression were recently increased. She denies SI/HI and scored "11" on depression screening. Pt works, lives with her daughter, drives and is well supported.  Depression screen reviewed  PHQ2/ PHQ9 completed Solution-Focused Strategies employed:  Active listening / Reflection utilized  Emotional Support Provided Pt agrees to a follow up call next month for check-in  Pt has crisis line #'s          Our next appointment is by telephone on 09/16/22   Please call the care guide team at [phone] if you need to cancel or reschedule your appointment.   If you are experiencing a Mental Health or [place] or need someone to talk to, please call the Suicide and Crisis Lifeline: 988 call the [place] National Suicide Prevention Lifeline: 617-761-1463 or TTY: 712-520-6258 TTY (415)222-2286) to  talk to a trained counselor call 911   The patient verbalized understanding of instructions, educational materials, and care plan provided today and DECLINED offer to receive copy of patient instructions, educational materials, and care plan.   Telephone follow up appointment with care management team member scheduled for: 09/16/22  Eduard Clos, MSW, [place] Worker [employer] [phone]

## 2022-08-23 ENCOUNTER — Telehealth: Payer: Self-pay

## 2022-08-23 NOTE — Telephone Encounter (Signed)
Called and spoke with the pt, reminded her of her appointment with research on Monday 08/26/2022 at 9:00am. Informed her that she does need to be fasting and gave her the parking code.

## 2022-08-25 LAB — MICROALBUMIN / CREATININE URINE RATIO
Creatinine, Urine: 84.3 mg/dL
Microalb/Creat Ratio: 25 mg/g creat (ref 0–29)
Microalbumin, Urine: 21.4 ug/mL

## 2022-08-26 ENCOUNTER — Encounter: Payer: Self-pay | Admitting: *Deleted

## 2022-08-26 DIAGNOSIS — Z006 Encounter for examination for normal comparison and control in clinical research program: Secondary | ICD-10-CM

## 2022-08-26 DIAGNOSIS — F900 Attention-deficit hyperactivity disorder, predominantly inattentive type: Secondary | ICD-10-CM | POA: Diagnosis not present

## 2022-08-26 DIAGNOSIS — F411 Generalized anxiety disorder: Secondary | ICD-10-CM | POA: Diagnosis not present

## 2022-08-26 DIAGNOSIS — F331 Major depressive disorder, recurrent, moderate: Secondary | ICD-10-CM | POA: Diagnosis not present

## 2022-08-26 NOTE — Research (Cosign Needed)
Amy Glass Essence Qualification 05-Dec-2021    Apolipoprotein B48  mg/dL              '[]'$ Clinically Significant  '[x]'$ Not Clinically Significant   Any further action needed to be taken per the PI? No  Pixie Casino, MD, [facility], [place] Director of the Advanced Lipid Disorders &  Cardiovascular Risk Reduction Clinic Diplomate of the American Board of Clinical Lipidology Attending Cardiologist  Direct Dial: [phone]  Fax: [phone]  Website:  [email or web address]

## 2022-08-26 NOTE — Research (Cosign Needed)
Amy Glass Week 1 Day 1 18-Dec-2021     Apolipoproteins B48  7.38    mg/dL      '[]'$ Clinically Significant  '[x]'$ Not Clinically Significant   Any further action needed to be taken per the PI?  No  Pixie Casino, MD, [facility], [place] Director of the Advanced Lipid Disorders &  Cardiovascular Risk Reduction Clinic Diplomate of the American Board of Clinical Lipidology Attending Cardiologist  Direct Dial: [phone]  Fax: [phone]  Website:  [email or web address]

## 2022-08-26 NOTE — Research (Signed)
Spoke with Amy Glass she forgot about her appointment today. Rescheduled for tomorrow at 0930.

## 2022-08-26 NOTE — Research (Cosign Needed)
[facility] Essence Screening Run in 15-Nov-2021   Apolipoprotein B48 14.00 mg/dL        '[]'$ Clinically Significant  '[x]'$ Not Clinically Significant    Any further action needed to be taken per the PI? No  Pixie Casino, MD, [facility], [place] Director of the Advanced Lipid Disorders &  Cardiovascular Risk Reduction Clinic Diplomate of the American Board of Clinical Lipidology Attending Cardiologist  Direct Dial: [phone]  Fax: [phone]  Website:  [email or web address]

## 2022-08-27 ENCOUNTER — Encounter: Payer: Self-pay | Admitting: *Deleted

## 2022-08-27 DIAGNOSIS — F331 Major depressive disorder, recurrent, moderate: Secondary | ICD-10-CM | POA: Diagnosis not present

## 2022-08-27 DIAGNOSIS — F411 Generalized anxiety disorder: Secondary | ICD-10-CM | POA: Diagnosis not present

## 2022-08-27 DIAGNOSIS — Z006 Encounter for examination for normal comparison and control in clinical research program: Secondary | ICD-10-CM

## 2022-08-27 DIAGNOSIS — F900 Attention-deficit hyperactivity disorder, predominantly inattentive type: Secondary | ICD-10-CM | POA: Diagnosis not present

## 2022-08-27 NOTE — Research (Signed)
Amy Glass needs to move her appointment to tomorrow at 56. Gave her the parking code.

## 2022-08-28 ENCOUNTER — Encounter: Payer: Medicare HMO | Admitting: *Deleted

## 2022-08-28 ENCOUNTER — Other Ambulatory Visit: Payer: Self-pay

## 2022-08-28 DIAGNOSIS — Z006 Encounter for examination for normal comparison and control in clinical research program: Secondary | ICD-10-CM

## 2022-08-28 MED ORDER — STUDY - ESSENCE - OLEZARSEN 50 MG, 80 MG OR PLACEBO SQ INJECTION (PI-HILTY)
80.0000 mg | INJECTION | SUBCUTANEOUS | Status: DC
  Administered 2022-08-28: 80 mg via SUBCUTANEOUS
  Filled 2022-08-28: qty 0.8

## 2022-08-28 NOTE — Research (Addendum)
Amy Glass 28-Aug-2022 Essence Week 37 day 253                     Chemistry: Glucose 167    mg/dL                                     [] Clinically Significant  [x] Not Clinically Significant Magnesium 1.7 mg/dL                                    [] Clinically Significant  [x] Not Clinically Significant ALT/SGPT 88  U/L                                          [] Clinically Significant  [x] Not Clinically Significant AST/SGOT 60 U/L                                          [] Clinically Significant  [x] Not Clinically Significant Gamma Glutamyl Transfers (GGT)  191 U/L  [] Clinically Significant  [x] Not Clinically Significant   Urinalysis: Blood 0.03 mg/dL                                             [] Clinically Significant  [x] Not Clinically Significant Protein 10 mg/dL                                              [] Clinically Significant  [x] Not Clinically Significant Leukocyte Esterase 500                                   [] Clinically Significant  [x] Not Clinically Significant Urinary White Blood Cells 50-75 per HPF        [] Clinically Significant  [x] Not Clinically Significant Squamous Epithelial Cells 10-15 per HPF       [] Clinically Significant  [x] Not Clinically Significant Transitional Epithelial Cells 1-2 per HPF          [] Clinically Significant  [x] Not Clinically Significant Mucus 1+ per HPF                                           [] Clinically Significant  [x] Not Clinically Significant  Urine Chemistry: Albumin  Creatinine Ratio 30 mg/g                  [] Clinically Significant  [x] Not Clinically Significant     Any further action needed to be taken per the PI?  No  Mildly elevated liver enzymes - would follow. No need to adjust therapy.  Chrystie Nose, MD, [facility], FACP  [place]  [facility]  Medical Director of the Advanced Lipid Disorders &  Cardiovascular Risk Reduction Clinic Diplomate of the American Board of Clinical  Lipidology Attending Cardiologist  Direct Dial: [phone]  Fax: [phone]  Website:  [email or web address]            TREATMENT DAY 37 - STUDY WEEK 253    Subject Number: [id number]           Randomization Number:   [id number]          Date:28-Aug-2022     [x] Vital Signs Collected - Blood Pressure:118/66 - Heart Rate:77 - Respiratory Rate:18 - Temperature: 98.0 - Oxygen Saturation:100%   [x]  Extended Urinalysis   [x]  Lab collection per protocol  [x] (abdominal pain only) since last visit  [x]  Assessment of ER Visits, Hospitalizations, and Inpatient Days  [x]  Adverse Events and Concomitant Medications  [x]  Diet, Lifestyle, and Alcohol Counseling   [x]  Study Drug: [place] Injection   Amy Glass is here for Week 37 Day 253 of the essence study. She reports no abd pain and no visits to the ed or urgent care since last seen. VS taken at 0909, Blood work drawn at 0918, and urine obtained at 0920. Injection given at 0946 in right lower abd. Kit number [id number] Tol well Reports no changes in her medications. Next appointment scheduled for March 6 at 0900.   Current Outpatient Medications:    Accu-Chek Softclix Lancets lancets, Use to check blood sugar TID. E11.65, Disp: 100 each, Rfl: 3   amLODipine (NORVASC) 10 MG tablet, TAKE 1 TABLET EVERY DAY, Disp: 90 tablet, Rfl: 0   amphetamine-dextroamphetamine (ADDERALL) 20 MG tablet, Take 20 mg by mouth 2 (two) times daily., Disp: , Rfl:    ARIPiprazole (ABILIFY) 2 MG tablet, Take 2 mg by mouth daily., Disp: , Rfl:    aspirin EC 81 MG tablet, Take 81 mg by mouth daily. Swallow whole., Disp: , Rfl:    atorvastatin (LIPITOR) 20 MG tablet, TAKE 1 TABLET EVERY DAY, Disp: 90 tablet, Rfl: 0   benzonatate (TESSALON) 100 MG capsule, Take 1 capsule (100 mg total) by mouth 2 (two) times daily as needed for cough., Disp: 20 capsule, Rfl: 0   Blood Glucose Monitoring Suppl (ACCU-CHEK GUIDE) w/Device KIT, Use to check blood sugar TID. E11.65, Disp: 1  kit, Rfl: 0   Continuous Blood Gluc Receiver (FREESTYLE LIBRE 3 READER) DEVI, 1 each by Does not apply route daily., Disp: 1 each, Rfl: 0   Continuous Blood Gluc Sensor (FREESTYLE LIBRE 3 SENSOR) MISC, Change Q 2 weeks, Disp: 2 each, Rfl: 6   desvenlafaxine (PRISTIQ) 50 MG 24 hr tablet, Take 50 mg by mouth daily., Disp: , Rfl:    fexofenadine (ALLEGRA) 180 MG tablet, Take 1 tablet (180 mg total) by mouth daily. (Patient taking differently: Take 180 mg by mouth daily as needed for allergies.), Disp: 90 tablet, Rfl: 1   glucose blood (ACCU-CHEK GUIDE) test strip, Use to check blood sugar TID. E11.65, Disp: 100 each, Rfl: 3   hydrOXYzine (ATARAX) 25 MG tablet, Take 25 mg by mouth daily., Disp: , Rfl:    insulin glargine (LANTUS SOLOSTAR) 100 UNIT/ML Solostar Pen, Inject 18 Units into the skin daily. Discontinue Amy Glass, Disp: 15 mL, Rfl: 11   Insulin Pen Needle (PEN NEEDLES) 32G X 4 MM MISC, Use to inject insulin once daily., Disp: 100 each, Rfl: 1   losartan (COZAAR) 100 MG tablet, TAKE 1 TABLET EVERY DAY, Disp: 90 tablet, Rfl: 1   Magnesium 400 MG CAPS, Take 400 mg by mouth daily., Disp: 30 capsule, Rfl: 1   Multiple Vitamins-Minerals (MULTIVITAMIN WITH MINERALS) tablet, Take 1 tablet by mouth daily., Disp: ,  Rfl:    nystatin cream (MYCOSTATIN), Apply 1 application topically 2 (two) times daily. (Patient taking differently: Apply 1 application  topically 2 (two) times daily as needed for dry skin.), Disp: 30 g, Rfl: 2   Olopatadine HCl (PATADAY) 0.2 % SOLN, Place 1 drop into both eyes daily as needed (allergies)., Disp: 2.5 mL, Rfl: 5   omeprazole (PRILOSEC) 40 MG capsule, Take 1 capsule (40 mg total) by mouth daily., Disp: 90 capsule, Rfl: 3   sitaGLIPtin-metformin (JANUMET) 50-1000 MG tablet, Take 1 tablet by mouth 2 (two) times daily with a meal., Disp: 180 tablet, Rfl: 4   Study - ESSENCE - olezarsen 50 mg, 80 mg or placebo SQ injection (PI-Hilty), Inject 80 mg into the skin every 28  (twenty-eight) days. For Investigational Use Only. Injection subcutaneously in protocol approved injection sites (abdomen, thigh or outer area of upper arm) every 4 weeks in clinic. Please contact  [facility] for any questions or concerns regarding this medication., Disp: , Rfl:    traMADol-acetaminophen (ULTRACET) 37.5-325 MG tablet, , Disp: , Rfl:    traZODone (DESYREL) 100 MG tablet, Take 1 tablet (100 mg total) by mouth at bedtime as needed for sleep. (Patient taking differently: Take 100 mg by mouth at bedtime.), Disp: 90 tablet, Rfl: 2   VASCEPA 1 g capsule, TAKE 2 CAPSULES TWICE DAILY, Disp: 360 capsule, Rfl: 3  Current Facility-Administered Medications:    Study - ESSENCE - olezarsen 50 mg, 80 mg or placebo SQ injection (PI-Hilty), 80 mg, Subcutaneous, Q28 days, Hilty, Lisette Abu, MD

## 2022-09-09 DIAGNOSIS — F411 Generalized anxiety disorder: Secondary | ICD-10-CM | POA: Diagnosis not present

## 2022-09-09 DIAGNOSIS — F331 Major depressive disorder, recurrent, moderate: Secondary | ICD-10-CM | POA: Diagnosis not present

## 2022-09-09 DIAGNOSIS — F900 Attention-deficit hyperactivity disorder, predominantly inattentive type: Secondary | ICD-10-CM | POA: Diagnosis not present

## 2022-09-10 ENCOUNTER — Inpatient Hospital Stay: Admission: RE | Admit: 2022-09-10 | Payer: Medicare HMO | Source: Ambulatory Visit

## 2022-09-12 ENCOUNTER — Ambulatory Visit: Payer: Medicare HMO | Admitting: *Deleted

## 2022-09-12 NOTE — Patient Instructions (Signed)
Visit Information  Thank you for taking time to visit with me today. Please don't hesitate to contact me if I can be of assistance to you.   Following are the goals we discussed today:   Goals Addressed             This Visit's Progress    Reduce depression, "sadness"         Pt reports doing "much better" and continues with her medication which she feels is helping). She is still planning to talk with her daughter about helping with the house chores and she feels overwhelmed and lacks motivation to get home tasks.chores completed. CSW made suggestions to pt to try small goals at getting the chores done; example used was to commit to 1 hour daily working on the tasks- or 30 minutes if 1 hour is not doable.  Pt continues to see her counselor, Margreta Journey, weekly- suggested she also talk with her about these concerns.         Our next appointment is by telephone on 09/26/22 Please call the care guide team at [phone] if you need to cancel or reschedule your appointment.   If you are experiencing a Mental Health or [place] or need someone to talk to, please call the Suicide and Crisis Lifeline: 988 call 911   The patient verbalized understanding of instructions, educational materials, and care plan provided today and DECLINED offer to receive copy of patient instructions, educational materials, and care plan.   Telephone follow up appointment with care management team member scheduled for: 09/26/22 Eduard Clos, MSW, [place] Worker [employer] [facility] [phone]

## 2022-09-12 NOTE — Patient Outreach (Signed)
  Care Coordination   Follow Up Visit Note   09/12/2022 Name: Amy Glass MRN: [id number] DOB: 02-02-1961  Amy Glass is a 62 y.o. year old female who sees [name], MD for primary care. I spoke with  Amy Glass by phone today.  What matters to the patients health and wellness today?  Doing better- "my dog is better too"    Goals Addressed             This Visit's Progress    Reduce depression, "sadness"         Pt reports doing "much better" and continues with her medication which she feels is helping). She is still planning to talk with her daughter about helping with the house chores and she feels overwhelmed and lacks motivation to get home tasks.chores completed. CSW made suggestions to pt to try small goals at getting the chores done; example used was to commit to 1 hour daily working on the tasks- or 30 minutes if 1 hour is not doable.  Pt continues to see her counselor, Margreta Journey, weekly- suggested she also talk with her about these concerns.         SDOH assessments and interventions completed:  Yes     Care Coordination Interventions:  Yes, provided  Interventions Today    Flowsheet Row Most Recent Value  Chronic Disease   Chronic disease during today's visit Diabetes  General Interventions   General Interventions Discussed/Reviewed General Interventions Discussed, General Interventions Reviewed  Mental Health Interventions   Mental Health Discussed/Reviewed Coping Strategies, Mental Health Discussed, Other  [active with Counselor]       Follow up plan: Follow up call scheduled for 09/26/22    Encounter Outcome:  Pt. Visit Completed

## 2022-09-16 ENCOUNTER — Encounter: Payer: Medicare HMO | Admitting: *Deleted

## 2022-09-17 DIAGNOSIS — F331 Major depressive disorder, recurrent, moderate: Secondary | ICD-10-CM | POA: Diagnosis not present

## 2022-09-17 NOTE — Research (Addendum)
Kit number [id number] was used for injection  Essence re-Consent     Subject Name: Amy Glass   Subject met inclusion and exclusion criteria.  The informed consent form, study requirements and expectations were reviewed with the subject and questions and concerns were addressed prior to the signing of the consent form.  The subject verbalized understanding of the trial requirements.  The subject agreed to participate in the Essence  trial and signed the informed consent at 1000 on 04-10-22.  The informed consent was obtained prior to performance of any protocol-specific procedures for the subject.  A copy of the signed informed consent was given to the subject and a copy was placed in the subject's medical record.     Consent dated 03-28-22

## 2022-09-18 ENCOUNTER — Telehealth: Payer: Self-pay | Admitting: Internal Medicine

## 2022-09-18 DIAGNOSIS — Z6828 Body mass index (BMI) 28.0-28.9, adult: Secondary | ICD-10-CM | POA: Diagnosis not present

## 2022-09-18 DIAGNOSIS — Z79899 Other long term (current) drug therapy: Secondary | ICD-10-CM | POA: Diagnosis not present

## 2022-09-18 DIAGNOSIS — I10 Essential (primary) hypertension: Secondary | ICD-10-CM | POA: Diagnosis not present

## 2022-09-18 DIAGNOSIS — Z0130 Encounter for examination of blood pressure without abnormal findings: Secondary | ICD-10-CM | POA: Diagnosis not present

## 2022-09-18 DIAGNOSIS — M5416 Radiculopathy, lumbar region: Secondary | ICD-10-CM | POA: Diagnosis not present

## 2022-09-18 DIAGNOSIS — M129 Arthropathy, unspecified: Secondary | ICD-10-CM | POA: Diagnosis not present

## 2022-09-18 DIAGNOSIS — E1165 Type 2 diabetes mellitus with hyperglycemia: Secondary | ICD-10-CM | POA: Diagnosis not present

## 2022-09-18 DIAGNOSIS — M539 Dorsopathy, unspecified: Secondary | ICD-10-CM | POA: Diagnosis not present

## 2022-09-18 DIAGNOSIS — G629 Polyneuropathy, unspecified: Secondary | ICD-10-CM | POA: Diagnosis not present

## 2022-09-18 NOTE — Telephone Encounter (Signed)
Phone call placed to patient this morning.  I received order for signature for bone density study.  It was ordered by CMA when she had her Medicare wellness visit done.  Advised patient that she is not at the age as yet for the bone density study.  Order has been canceled.  Patient expressed understanding.

## 2022-09-18 NOTE — Telephone Encounter (Signed)
Called [facility] and spoke to [place]. She confirmed the the order has been cancelled along with the appointment.

## 2022-09-23 ENCOUNTER — Telehealth: Payer: Self-pay | Admitting: Emergency Medicine

## 2022-09-23 DIAGNOSIS — F909 Attention-deficit hyperactivity disorder, unspecified type: Secondary | ICD-10-CM

## 2022-09-23 NOTE — Addendum Note (Signed)
Addended by: [name] on: 09/23/2022 09:37 PM   Modules accepted: Orders

## 2022-09-23 NOTE — Telephone Encounter (Signed)
Copied from [place] [id number]. Topic: General - Inquiry >> Sep 23, 2022  2:46 PM Marcellus Scott wrote: Reason for [id number] from [facility] is requesting a referral. Insurance is requesting that they have a referral from pt's pcp. The patient receives medication from them for ADHD, insomnia, etc.  Please advise.

## 2022-09-24 ENCOUNTER — Encounter: Payer: Self-pay | Admitting: *Deleted

## 2022-09-24 DIAGNOSIS — Z79899 Other long term (current) drug therapy: Secondary | ICD-10-CM | POA: Diagnosis not present

## 2022-09-24 DIAGNOSIS — F331 Major depressive disorder, recurrent, moderate: Secondary | ICD-10-CM | POA: Diagnosis not present

## 2022-09-24 DIAGNOSIS — Z006 Encounter for examination for normal comparison and control in clinical research program: Secondary | ICD-10-CM

## 2022-09-24 NOTE — Research (Signed)
Sent at reminder for her appointment tomorrow with research at 0900. Also gave the parking code.

## 2022-09-25 ENCOUNTER — Encounter: Payer: Self-pay | Admitting: *Deleted

## 2022-09-25 DIAGNOSIS — Z006 Encounter for examination for normal comparison and control in clinical research program: Secondary | ICD-10-CM

## 2022-09-25 NOTE — Research (Signed)
Scheduled Amy Glass for tomorrow at 0900 for essence research. Gave her the parking code and no need to be NPO.

## 2022-09-26 ENCOUNTER — Encounter: Payer: Medicare HMO | Admitting: *Deleted

## 2022-09-26 ENCOUNTER — Other Ambulatory Visit: Payer: Self-pay

## 2022-09-26 MED ORDER — STUDY - ESSENCE - OLEZARSEN 50 MG, 80 MG OR PLACEBO SQ INJECTION (PI-HILTY)
80.0000 mg | INJECTION | SUBCUTANEOUS | Status: DC
  Filled 2022-09-26: qty 0.8

## 2022-09-26 NOTE — Research (Addendum)
TREATMENT DAY 281 - STUDY WEEK 41    Subject Number: [id number]            Randomization Number: [id number]             Date:26-September-2022    '[x]'$ Vital Signs Collected - Blood Pressure:134/80 - Heart Rate:87 - Respiratory Rate:16 - Temperature:98.2 - Oxygen Saturation:100%    '[x]'$  (abdominal pain only) since last visit  '[x]'$  Assessment of ER Visits, Hospitalizations, and Inpatient Days  '[x]'$  Adverse Events and Concomitant Medications  '[x]'$  Diet, Lifestyle, and Alcohol Counseling   '[x]'$  Study Drug:  Injection   Mr Amy Glass is here for Week 41 day 281 of Essence research. She reports no abd pain, no visits to the Ed or Urgent care since last seen. Vs taken at 0910 BP 134/80 HR-87 Temp 98.2 Resp 16 O2 sat 100% Injection given at 0950 in right lower abd.tol well. Kit number [id number]. Next appointment scheduled for April 3 at 0900. Ms Amy Glass states she just started taking Atarax for anxiety last month. Note on med last.      Current Outpatient Medications:    Accu-Chek Softclix Lancets lancets, Use to check blood sugar TID. E11.65, Disp: 100 each, Rfl: 3   amLODipine (NORVASC) 10 MG tablet, TAKE 1 TABLET EVERY DAY, Disp: 90 tablet, Rfl: 0   amphetamine-dextroamphetamine (ADDERALL) 20 MG tablet, Take 20 mg by mouth 2 (two) times daily., Disp: , Rfl:    ARIPiprazole (ABILIFY) 2 MG tablet, Take 2 mg by mouth daily., Disp: , Rfl:    aspirin EC 81 MG tablet, Take 81 mg by mouth daily. Swallow whole., Disp: , Rfl:    atorvastatin (LIPITOR) 20 MG tablet, TAKE 1 TABLET EVERY DAY, Disp: 90 tablet, Rfl: 0   Blood Glucose Monitoring Suppl (ACCU-CHEK GUIDE) w/Device KIT, Use to check blood sugar TID. E11.65, Disp: 1 kit, Rfl: 0   Continuous Blood Gluc Receiver (FREESTYLE LIBRE 3 READER) DEVI, 1 each by Does not apply route daily., Disp: 1 each, Rfl: 0   Continuous Blood Gluc Sensor (FREESTYLE LIBRE 3 SENSOR) MISC, Change Q 2 weeks, Disp: 2 each, Rfl: 6   desvenlafaxine (PRISTIQ) 50  MG 24 hr tablet, Take 50 mg by mouth daily., Disp: , Rfl:    glucose blood (ACCU-CHEK GUIDE) test strip, Use to check blood sugar TID. E11.65, Disp: 100 each, Rfl: 3   hydrOXYzine (ATARAX) 25 MG tablet, Take 25 mg by mouth daily., Disp: , Rfl:    insulin glargine (LANTUS SOLOSTAR) 100 UNIT/ML Solostar Pen, Inject 18 Units into the skin daily. Discontinue Tyler Aas, Disp: 15 mL, Rfl: 11   Insulin Pen Needle (PEN NEEDLES) 32G X 4 MM MISC, Use to inject insulin once daily., Disp: 100 each, Rfl: 1   losartan (COZAAR) 100 MG tablet, TAKE 1 TABLET EVERY DAY, Disp: 90 tablet, Rfl: 1   Magnesium 400 MG CAPS, Take 400 mg by mouth daily., Disp: 30 capsule, Rfl: 1   Multiple Vitamins-Minerals (MULTIVITAMIN WITH MINERALS) tablet, Take 1 tablet by mouth daily., Disp: , Rfl:    nystatin cream (MYCOSTATIN), Apply 1 application topically 2 (two) times daily. (Patient taking differently: Apply 1 application  topically 2 (two) times daily as needed for dry skin.), Disp: 30 g, Rfl: 2   Olopatadine HCl (PATADAY) 0.2 % SOLN, Place 1 drop into both eyes daily as needed (allergies)., Disp: 2.5 mL, Rfl: 5   omeprazole (PRILOSEC) 40 MG capsule, Take 1 capsule (40 mg total)  by mouth daily., Disp: 90 capsule, Rfl: 3   sitaGLIPtin-metformin (JANUMET) 50-1000 MG tablet, Take 1 tablet by mouth 2 (two) times daily with a meal., Disp: 180 tablet, Rfl: 4   Study - ESSENCE - olezarsen 50 mg, 80 mg or placebo SQ injection (PI-Hilty), Inject 80 mg into the skin every 28 (twenty-eight) days. For Investigational Use Only. Injection subcutaneously in protocol approved injection sites (abdomen, thigh or outer area of upper arm) every 4 weeks in clinic. Please contact  [facility] for any questions or concerns regarding this medication., Disp: , Rfl:    traMADol-acetaminophen (ULTRACET) 37.5-325 MG tablet, , Disp: , Rfl:    traZODone (DESYREL) 100 MG tablet, Take 1 tablet (100 mg total) by mouth at bedtime as  needed for sleep. (Patient taking differently: Take 100 mg by mouth at bedtime.), Disp: 90 tablet, Rfl: 2   VASCEPA 1 g capsule, TAKE 2 CAPSULES TWICE DAILY, Disp: 360 capsule, Rfl: 3   benzonatate (TESSALON) 100 MG capsule, Take 1 capsule (100 mg total) by mouth 2 (two) times daily as needed for cough. (Patient not taking: Reported on 09/26/2022), Disp: 20 capsule, Rfl: 0   fexofenadine (ALLEGRA) 180 MG tablet, Take 1 tablet (180 mg total) by mouth daily. (Patient not taking: Reported on 09/26/2022), Disp: 90 tablet, Rfl: 1  Current Facility-Administered Medications:    Study - ESSENCE - olezarsen 50 mg, 80 mg or placebo SQ injection (PI-Hilty), 80 mg, Subcutaneous, Q28 days, Hilty, Nadean Corwin, MD

## 2022-09-30 DIAGNOSIS — F331 Major depressive disorder, recurrent, moderate: Secondary | ICD-10-CM | POA: Diagnosis not present

## 2022-09-30 NOTE — Telephone Encounter (Signed)
Noted  

## 2022-09-30 NOTE — Telephone Encounter (Signed)
Amy Glass called looking for referral / advise her it was sent to them on 3.6.24 / she will check if they have it/ please resend in case

## 2022-10-03 DIAGNOSIS — H0102B Squamous blepharitis left eye, upper and lower eyelids: Secondary | ICD-10-CM | POA: Diagnosis not present

## 2022-10-03 DIAGNOSIS — H0102A Squamous blepharitis right eye, upper and lower eyelids: Secondary | ICD-10-CM | POA: Diagnosis not present

## 2022-10-03 DIAGNOSIS — H02423 Myogenic ptosis of bilateral eyelids: Secondary | ICD-10-CM | POA: Diagnosis not present

## 2022-10-03 DIAGNOSIS — H02834 Dermatochalasis of left upper eyelid: Secondary | ICD-10-CM | POA: Diagnosis not present

## 2022-10-03 DIAGNOSIS — H02831 Dermatochalasis of right upper eyelid: Secondary | ICD-10-CM | POA: Diagnosis not present

## 2022-10-03 DIAGNOSIS — H2513 Age-related nuclear cataract, bilateral: Secondary | ICD-10-CM | POA: Diagnosis not present

## 2022-10-03 DIAGNOSIS — E119 Type 2 diabetes mellitus without complications: Secondary | ICD-10-CM | POA: Diagnosis not present

## 2022-10-03 LAB — HM DIABETES EYE EXAM

## 2022-10-05 ENCOUNTER — Other Ambulatory Visit: Payer: Self-pay | Admitting: Internal Medicine

## 2022-10-05 DIAGNOSIS — E1159 Type 2 diabetes mellitus with other circulatory complications: Secondary | ICD-10-CM

## 2022-10-07 DIAGNOSIS — F331 Major depressive disorder, recurrent, moderate: Secondary | ICD-10-CM | POA: Diagnosis not present

## 2022-10-11 ENCOUNTER — Other Ambulatory Visit: Payer: Self-pay

## 2022-10-16 DIAGNOSIS — M539 Dorsopathy, unspecified: Secondary | ICD-10-CM | POA: Diagnosis not present

## 2022-10-16 DIAGNOSIS — Z6828 Body mass index (BMI) 28.0-28.9, adult: Secondary | ICD-10-CM | POA: Diagnosis not present

## 2022-10-16 DIAGNOSIS — Z0130 Encounter for examination of blood pressure without abnormal findings: Secondary | ICD-10-CM | POA: Diagnosis not present

## 2022-10-16 DIAGNOSIS — R7401 Elevation of levels of liver transaminase levels: Secondary | ICD-10-CM | POA: Diagnosis not present

## 2022-10-16 DIAGNOSIS — G629 Polyneuropathy, unspecified: Secondary | ICD-10-CM | POA: Diagnosis not present

## 2022-10-16 DIAGNOSIS — E559 Vitamin D deficiency, unspecified: Secondary | ICD-10-CM | POA: Diagnosis not present

## 2022-10-16 DIAGNOSIS — M5416 Radiculopathy, lumbar region: Secondary | ICD-10-CM | POA: Diagnosis not present

## 2022-10-16 DIAGNOSIS — I10 Essential (primary) hypertension: Secondary | ICD-10-CM | POA: Diagnosis not present

## 2022-10-16 DIAGNOSIS — Z79899 Other long term (current) drug therapy: Secondary | ICD-10-CM | POA: Diagnosis not present

## 2022-10-16 LAB — LAB REPORT - SCANNED
EGFR: 102
EGFR: 123

## 2022-10-18 ENCOUNTER — Other Ambulatory Visit: Payer: Medicare HMO

## 2022-10-21 ENCOUNTER — Other Ambulatory Visit: Payer: Self-pay | Admitting: Internal Medicine

## 2022-10-21 DIAGNOSIS — Z79899 Other long term (current) drug therapy: Secondary | ICD-10-CM | POA: Diagnosis not present

## 2022-10-21 DIAGNOSIS — F331 Major depressive disorder, recurrent, moderate: Secondary | ICD-10-CM | POA: Diagnosis not present

## 2022-10-23 ENCOUNTER — Encounter: Payer: Medicare HMO | Admitting: *Deleted

## 2022-10-23 ENCOUNTER — Other Ambulatory Visit: Payer: Self-pay

## 2022-10-23 DIAGNOSIS — Z006 Encounter for examination for normal comparison and control in clinical research program: Secondary | ICD-10-CM

## 2022-10-23 MED ORDER — STUDY - ESSENCE - OLEZARSEN 50 MG, 80 MG OR PLACEBO SQ INJECTION (PI-HILTY)
80.0000 mg | INJECTION | SUBCUTANEOUS | Status: DC
  Administered 2022-10-23: 80 mg via SUBCUTANEOUS
  Filled 2022-10-23: qty 0.8

## 2022-10-23 NOTE — Research (Signed)
TREATMENT DAY 309 - STUDY WEEK 45    Subject Number: [id number]            Randomization Number: [id number]             Date: 23-October-2022      [x] Vital Signs Collected - Blood Pressure:132/70 - Heart Rate:94 - Respiratory Rate:16 - Temperature:97.1 - Oxygen Saturation:99%   [x]  (abdominal pain only) since last visit  [x]  Assessment of ER Visits, Hospitalizations, and Inpatient Days  [x]  Adverse Events and Concomitant Medications  [x]  Diet, Lifestyle, and Alcohol Counseling   [x]  Study Drug: [place] Injection    Amy Glass is here for Week 61 Day 309 of Essence research.She reports no abd pain, no visits to the Ed or urgent care , and no changes in her meds since last seen. VS taken at 0914.Injection give in right lower abd at 0932. Kit number [id number].Scheduled next visit for Nov 19 928.    Current Outpatient Medications:    Accu-Chek Softclix Lancets lancets, Use to check blood sugar TID. E11.65, Disp: 100 each, Rfl: 3   amLODipine (NORVASC) 10 MG tablet, TAKE 1 TABLET EVERY DAY, Disp: 90 tablet, Rfl: 1   amphetamine-dextroamphetamine (ADDERALL) 20 MG tablet, Take 20 mg by mouth 2 (two) times daily., Disp: , Rfl:    ARIPiprazole (ABILIFY) 2 MG tablet, Take 2 mg by mouth daily., Disp: , Rfl:    aspirin EC 81 MG tablet, Take 81 mg by mouth daily. Swallow whole., Disp: , Rfl:    atorvastatin (LIPITOR) 20 MG tablet, TAKE 1 TABLET EVERY DAY, Disp: 90 tablet, Rfl: 0   benzonatate (TESSALON) 100 MG capsule, Take 1 capsule (100 mg total) by mouth 2 (two) times daily as needed for cough. (Patient not taking: Reported on 09/26/2022), Disp: 20 capsule, Rfl: 0   Blood Glucose Monitoring Suppl (ACCU-CHEK GUIDE) w/Device KIT, Use to check blood sugar TID. E11.65, Disp: 1 kit, Rfl: 0   Continuous Blood Gluc Receiver (FREESTYLE LIBRE 3 READER) DEVI, 1 each by Does not apply route daily., Disp: 1 each, Rfl: 0   Continuous Blood Gluc Sensor (FREESTYLE LIBRE 3 SENSOR) MISC, Change Q 2 weeks, Disp: 2  each, Rfl: 6   desvenlafaxine (PRISTIQ) 50 MG 24 hr tablet, Take 50 mg by mouth daily., Disp: , Rfl:    fexofenadine (ALLEGRA) 180 MG tablet, Take 1 tablet (180 mg total) by mouth daily. (Patient not taking: Reported on 09/26/2022), Disp: 90 tablet, Rfl: 1   glucose blood (ACCU-CHEK GUIDE) test strip, Use to check blood sugar TID. E11.65, Disp: 100 each, Rfl: 3   hydrOXYzine (ATARAX) 25 MG tablet, Take 25 mg by mouth daily., Disp: , Rfl:    insulin glargine (LANTUS SOLOSTAR) 100 UNIT/ML Solostar Pen, Inject 18 Units into the skin daily. Discontinue Tyler Aas, Disp: 15 mL, Rfl: 11   Insulin Pen Needle (PEN NEEDLES) 32G X 4 MM MISC, Use to inject insulin once daily., Disp: 100 each, Rfl: 1   losartan (COZAAR) 100 MG tablet, TAKE 1 TABLET EVERY DAY, Disp: 90 tablet, Rfl: 1   Magnesium 400 MG CAPS, Take 400 mg by mouth daily., Disp: 30 capsule, Rfl: 1   Multiple Vitamins-Minerals (MULTIVITAMIN WITH MINERALS) tablet, Take 1 tablet by mouth daily., Disp: , Rfl:    nystatin cream (MYCOSTATIN), Apply 1 application topically 2 (two) times daily. (Patient taking differently: Apply 1 application  topically 2 (two) times daily as needed for dry skin.), Disp: 30 g, Rfl: 2   Olopatadine HCl (  PATADAY) 0.2 % SOLN, Place 1 drop into both eyes daily as needed (allergies)., Disp: 2.5 mL, Rfl: 5   omeprazole (PRILOSEC) 40 MG capsule, Take 1 capsule (40 mg total) by mouth daily., Disp: 90 capsule, Rfl: 3   sitaGLIPtin-metformin (JANUMET) 50-1000 MG tablet, Take 1 tablet by mouth 2 (two) times daily with a meal., Disp: 180 tablet, Rfl: 4   Study - ESSENCE - olezarsen 50 mg, 80 mg or placebo SQ injection (PI-Hilty), Inject 80 mg into the skin every 28 (twenty-eight) days. For Investigational Use Only. Injection subcutaneously in protocol approved injection sites (abdomen, thigh or outer area of upper arm) every 4 weeks in clinic. Please contact  [facility] for any questions or concerns regarding  this medication., Disp: , Rfl:    traMADol-acetaminophen (ULTRACET) 37.5-325 MG tablet, , Disp: , Rfl:    traZODone (DESYREL) 100 MG tablet, Take 1 tablet (100 mg total) by mouth at bedtime as needed for sleep. (Patient taking differently: Take 100 mg by mouth at bedtime.), Disp: 90 tablet, Rfl: 2   VASCEPA 1 g capsule, TAKE 2 CAPSULES TWICE DAILY, Disp: 360 capsule, Rfl: 3  Current Facility-Administered Medications:    Study - ESSENCE - olezarsen 50 mg, 80 mg or placebo SQ injection (PI-Hilty), 80 mg, Subcutaneous, Q28 days, Hilty, Nadean Corwin, MD

## 2022-10-24 DIAGNOSIS — R7401 Elevation of levels of liver transaminase levels: Secondary | ICD-10-CM | POA: Diagnosis not present

## 2022-11-04 DIAGNOSIS — F411 Generalized anxiety disorder: Secondary | ICD-10-CM | POA: Diagnosis not present

## 2022-11-04 DIAGNOSIS — F331 Major depressive disorder, recurrent, moderate: Secondary | ICD-10-CM | POA: Diagnosis not present

## 2022-11-04 DIAGNOSIS — F900 Attention-deficit hyperactivity disorder, predominantly inattentive type: Secondary | ICD-10-CM | POA: Diagnosis not present

## 2022-11-14 ENCOUNTER — Encounter: Payer: Self-pay | Admitting: *Deleted

## 2022-11-14 ENCOUNTER — Telehealth: Payer: Self-pay | Admitting: *Deleted

## 2022-11-14 NOTE — Patient Outreach (Signed)
  Care Coordination   11/14/2022 Name: Amy Glass MRN: [id number] DOB: 11/12/1960   Care Coordination Outreach Attempts:  An unsuccessful telephone outreach was attempted today to offer the patient information about available care coordination services as a benefit of their health plan.   Follow Up Plan:  Additional outreach attempts will be made to offer the patient care coordination information and services.   Encounter Outcome:  Pt. Visit Completed   Care Coordination Interventions:  No, not indicated    Reece Levy, MSW, LCSW Clinical Social Worker [facility] [phone]

## 2022-11-19 ENCOUNTER — Encounter: Payer: Self-pay | Admitting: *Deleted

## 2022-11-19 DIAGNOSIS — Z006 Encounter for examination for normal comparison and control in clinical research program: Secondary | ICD-10-CM

## 2022-11-19 NOTE — Research (Signed)
Messaged Ms Frie to remind her of her appointment tomorrow at 0930, gave parking code and no need to be NPO.

## 2022-11-20 ENCOUNTER — Other Ambulatory Visit: Payer: Self-pay

## 2022-11-20 ENCOUNTER — Encounter: Payer: Medicare HMO | Admitting: *Deleted

## 2022-11-20 ENCOUNTER — Other Ambulatory Visit: Payer: Self-pay | Admitting: Internal Medicine

## 2022-11-20 DIAGNOSIS — E1165 Type 2 diabetes mellitus with hyperglycemia: Secondary | ICD-10-CM

## 2022-11-20 DIAGNOSIS — Z006 Encounter for examination for normal comparison and control in clinical research program: Secondary | ICD-10-CM

## 2022-11-20 MED ORDER — STUDY - ESSENCE - OLEZARSEN 50 MG, 80 MG OR PLACEBO SQ INJECTION (PI-HILTY)
80.0000 mg | INJECTION | SUBCUTANEOUS | Status: DC
  Administered 2022-11-20: 80 mg via SUBCUTANEOUS
  Filled 2022-11-20: qty 0.8

## 2022-11-20 NOTE — Research (Signed)
TREATMENT DAY 337 - STUDY WEEK 49    Subject Number: [id number]              Randomization Number: [id number]            Date: 20-Nov-2022      [x] Vital Signs Collected - Blood Pressure:134/94 - Heart Rate:81 - Respiratory Rate:18 - Temperature: 97.2 - Oxygen Saturation:98%  l  [x] (abdominal pain only) since last visit  [x]  Assessment of ER Visits, Hospitalizations, and Inpatient Days  [x]  Adverse Events and Concomitant Medications  [x]  Diet, Lifestyle, and Alcohol Counseling   [x]  Study Drug: [place] Injection   Ms Pietila is here for Week 49 day 337 of Essence. She reports no abd pain, no changes in her meds, and no visits to the Ed or Urgent care since last seen. Vs taken at 0937. Injection given at 1033 in right lower abd. Tol well kit number [id number].  Current Outpatient Medications:    Accu-Chek Softclix Lancets lancets, Use to check blood sugar TID. E11.65, Disp: 100 each, Rfl: 3   amLODipine (NORVASC) 10 MG tablet, TAKE 1 TABLET EVERY DAY, Disp: 90 tablet, Rfl: 1   amphetamine-dextroamphetamine (ADDERALL) 20 MG tablet, Take 20 mg by mouth 2 (two) times daily., Disp: , Rfl:    ARIPiprazole (ABILIFY) 2 MG tablet, Take 2 mg by mouth daily., Disp: , Rfl:    aspirin EC 81 MG tablet, Take 81 mg by mouth daily. Swallow whole., Disp: , Rfl:    atorvastatin (LIPITOR) 20 MG tablet, TAKE 1 TABLET EVERY DAY, Disp: 90 tablet, Rfl: 0   Blood Glucose Monitoring Suppl (ACCU-CHEK GUIDE) w/Device KIT, Use to check blood sugar TID. E11.65, Disp: 1 kit, Rfl: 0   Continuous Blood Gluc Receiver (FREESTYLE LIBRE 3 READER) DEVI, 1 each by Does not apply route daily., Disp: 1 each, Rfl: 0   Continuous Blood Gluc Sensor (FREESTYLE LIBRE 3 SENSOR) MISC, Change Q 2 weeks, Disp: 2 each, Rfl: 6   desvenlafaxine (PRISTIQ) 50 MG 24 hr tablet, Take 50 mg by mouth daily., Disp: , Rfl:    glucose blood (ACCU-CHEK GUIDE) test strip, Use to check blood sugar TID. E11.65, Disp: 100 each, Rfl: 3   hydrOXYzine  (ATARAX) 25 MG tablet, Take 25 mg by mouth daily., Disp: , Rfl:    insulin glargine (LANTUS SOLOSTAR) 100 UNIT/ML Solostar Pen, Inject 18 Units into the skin daily. Discontinue Evaristo Bury, Disp: 15 mL, Rfl: 11   Insulin Pen Needle (PEN NEEDLES) 32G X 4 MM MISC, Use to inject insulin once daily., Disp: 100 each, Rfl: 1   losartan (COZAAR) 100 MG tablet, TAKE 1 TABLET EVERY DAY, Disp: 90 tablet, Rfl: 1   Magnesium 400 MG CAPS, Take 400 mg by mouth daily., Disp: 30 capsule, Rfl: 1   Multiple Vitamins-Minerals (MULTIVITAMIN WITH MINERALS) tablet, Take 1 tablet by mouth daily., Disp: , Rfl:    nystatin cream (MYCOSTATIN), Apply 1 application topically 2 (two) times daily. (Patient taking differently: Apply 1 application  topically 2 (two) times daily as needed for dry skin.), Disp: 30 g, Rfl: 2   Olopatadine HCl (PATADAY) 0.2 % SOLN, Place 1 drop into both eyes daily as needed (allergies)., Disp: 2.5 mL, Rfl: 5   omeprazole (PRILOSEC) 40 MG capsule, Take 1 capsule (40 mg total) by mouth daily., Disp: 90 capsule, Rfl: 3   sitaGLIPtin-metformin (JANUMET) 50-1000 MG tablet, Take 1 tablet by mouth 2 (two) times daily with a meal., Disp: 180 tablet, Rfl: 4  Study - ESSENCE - olezarsen 50 mg, 80 mg or placebo SQ injection (PI-Hilty), Inject 80 mg into the skin every 28 (twenty-eight) days. For Investigational Use Only. Injection subcutaneously in protocol approved injection sites (abdomen, thigh or outer area of upper arm) every 4 weeks in clinic. Please contact  [facility] for any questions or concerns regarding this medication., Disp: , Rfl:    traMADol-acetaminophen (ULTRACET) 37.5-325 MG tablet, , Disp: , Rfl:    traZODone (DESYREL) 100 MG tablet, Take 1 tablet (100 mg total) by mouth at bedtime as needed for sleep. (Patient taking differently: Take 100 mg by mouth at bedtime.), Disp: 90 tablet, Rfl: 2   VASCEPA 1 g capsule, TAKE 2 CAPSULES TWICE DAILY, Disp: 360 capsule, Rfl:  3   benzonatate (TESSALON) 100 MG capsule, Take 1 capsule (100 mg total) by mouth 2 (two) times daily as needed for cough. (Patient not taking: Reported on 09/26/2022), Disp: 20 capsule, Rfl: 0   fexofenadine (ALLEGRA) 180 MG tablet, Take 1 tablet (180 mg total) by mouth daily. (Patient not taking: Reported on 09/26/2022), Disp: 90 tablet, Rfl: 1  Current Facility-Administered Medications:    Study - ESSENCE - olezarsen 50 mg, 80 mg or placebo SQ injection (PI-Hilty), 80 mg, Subcutaneous, Q28 days, Hilty, Lisette Abu, MD

## 2022-11-21 ENCOUNTER — Ambulatory Visit: Payer: Self-pay | Admitting: *Deleted

## 2022-11-21 NOTE — Patient Outreach (Signed)
  Care Coordination   Follow Up Visit Note   11/21/2022 Name: Amy Glass MRN: [id number] DOB: 09/29/1960  Amy Glass is a 62 y.o. year old female who sees Hilty, Lisette Abu, MD for primary care. I spoke with  Imagene Sheller by phone today.  What matters to the patients health and wellness today?  Had a good conversation with my daughter in [place] and is has made things much better.    Goals Addressed             This Visit's Progress    COMPLETED: Reduce depression, "sadness"         Glad you have gotten some help from family with your helping your Brother in [facility].  So glad you and your daughter in [place] had a conversation that has resolved some of your personal conflict and strain- leading to your visit to [place] and being "pampered"! Continue to take your medication and see your Counselor-  Trula Ore,        SDOH assessments and interventions completed:  Yes     Care Coordination Interventions:  Yes, provided  Interventions Today    Flowsheet Row Most Recent Value  Chronic Disease   Chronic disease during today's visit Other  [depression]  General Interventions   General Interventions Discussed/Reviewed Community Resources  Mental Health Interventions   Mental Health Discussed/Reviewed Mental Health Discussed, Mental Health Reviewed, Coping Strategies, Depression, Other  [Pt reports improvement in depression symptoms- reconciled with one of her daughters, has had family helping her more and overall much better.]       Follow up plan: No further intervention required.   Encounter Outcome:  Pt. Visit Completed

## 2022-11-21 NOTE — Patient Instructions (Signed)
Visit Information  Thank you for taking time to visit with me today. Please don't hesitate to contact me if I can be of assistance to you.   Following are the goals we discussed today:   Goals Addressed             This Visit's Progress    COMPLETED: Reduce depression, "sadness"         Glad you have gotten some help from family with your helping your Brother in [facility].  So glad you and your daughter in [place] had a conversation that has resolved some of your personal conflict and strain- leading to your visit to [place] and being "pampered"! Continue to take your medication and see your Counselor-  Trula Ore,        If you are experiencing a Mental Health or Behavioral Health Crisis or need someone to talk to, please call the Suicide and Crisis Lifeline: 988 call 911   The patient verbalized understanding of instructions, educational materials, and care plan provided today and DECLINED offer to receive copy of patient instructions, educational materials, and care plan.   No further follow up required:    Reece Levy, MSW, LCSW Clinical Social Worker [facility] [phone]

## 2022-11-25 DIAGNOSIS — F900 Attention-deficit hyperactivity disorder, predominantly inattentive type: Secondary | ICD-10-CM | POA: Diagnosis not present

## 2022-11-25 DIAGNOSIS — F411 Generalized anxiety disorder: Secondary | ICD-10-CM | POA: Diagnosis not present

## 2022-11-25 DIAGNOSIS — F331 Major depressive disorder, recurrent, moderate: Secondary | ICD-10-CM | POA: Diagnosis not present

## 2022-12-05 DIAGNOSIS — F900 Attention-deficit hyperactivity disorder, predominantly inattentive type: Secondary | ICD-10-CM | POA: Diagnosis not present

## 2022-12-05 DIAGNOSIS — F411 Generalized anxiety disorder: Secondary | ICD-10-CM | POA: Diagnosis not present

## 2022-12-05 DIAGNOSIS — F331 Major depressive disorder, recurrent, moderate: Secondary | ICD-10-CM | POA: Diagnosis not present

## 2022-12-12 DIAGNOSIS — F411 Generalized anxiety disorder: Secondary | ICD-10-CM | POA: Diagnosis not present

## 2022-12-12 DIAGNOSIS — F900 Attention-deficit hyperactivity disorder, predominantly inattentive type: Secondary | ICD-10-CM | POA: Diagnosis not present

## 2022-12-12 DIAGNOSIS — F331 Major depressive disorder, recurrent, moderate: Secondary | ICD-10-CM | POA: Diagnosis not present

## 2022-12-17 ENCOUNTER — Encounter: Payer: Self-pay | Admitting: *Deleted

## 2022-12-17 DIAGNOSIS — Z006 Encounter for examination for normal comparison and control in clinical research program: Secondary | ICD-10-CM

## 2022-12-17 NOTE — Research (Signed)
Text sent to Ms Rueger to remind her of her appointment tomorrow ar 0930. Reminded to be NPO and gave her the parking code.

## 2022-12-18 ENCOUNTER — Encounter: Payer: Medicare HMO | Admitting: *Deleted

## 2022-12-18 ENCOUNTER — Other Ambulatory Visit: Payer: Self-pay

## 2022-12-18 VITALS — BP 121/85 | HR 85 | Temp 97.9°F | Resp 16 | Wt 140.0 lb

## 2022-12-18 DIAGNOSIS — K754 Autoimmune hepatitis: Secondary | ICD-10-CM | POA: Diagnosis not present

## 2022-12-18 DIAGNOSIS — Z006 Encounter for examination for normal comparison and control in clinical research program: Secondary | ICD-10-CM

## 2022-12-18 DIAGNOSIS — K760 Fatty (change of) liver, not elsewhere classified: Secondary | ICD-10-CM | POA: Diagnosis not present

## 2022-12-18 NOTE — Progress Notes (Signed)
Patient in for final physical visit.  She continues to do well, and we reviewed her trial experience to date.  No chest pain or other complicating symptoms.   BP 121/85 P85 R 16 T97.9  SPO2 97% No JVD Alert, oriented Lungs clear to auscultation and percussioin Chest Cor regular without murmur Abd - soft Ext  no edema.  Toenails painted red.  Cannot assess cap refill.   ECG  - NSR  Normal ECG.  Compared to 11/127/23 R wave progression better likely due to lead position.  Probably no significant change.    Impression:  Week 63 Essence study doing well  Plan:  continue follow up.   Arturo Morton. Riley Kill, MD Medical Director, [facility]

## 2022-12-18 NOTE — Research (Addendum)
Amy Glass Essence Week 53 Day [phone] 18-Dec-2022                Chemistry: Glucose  204  mg/dL                             [] Clinically Significant  [x] Not Clinically Significant Total Protein 8.3 g/dL                            [] Clinically Significant  [x] Not Clinically Significant Magnesium 1.5 mg/dL                           [] Clinically Significant  [x] Not Clinically Significant ALT/SGPT 118 U/L                                [x] Clinically Significant  [] Not Clinically Significant AST/SGOT 74 U/L                                 [x] Clinically Significant  [] Not Clinically Significant Alkaline Phosphatase 178 U/L               [x] Clinically Significant  [] Not Clinically Significant Gamma Glutamyl Transfers 329 U/L      [x] Clinically Significant  [] Not Clinically Significant Hs-C-Reactive Protein 13.9 mg/L           [x] Clinically Significant  [] Not Clinically Significant Hemoglobin A1c 10.8 %                         [x] Clinically Significant  [] Not Clinically Significant   Urinalysis: Leukocyte Esterase 500                         [] Clinically Significant  [x] Not Clinically Significant Urinary White Blood cells 10-15             [] Clinically Significant  [x] Not Clinically Significant Bacteria 1+                                             [] Clinically Significant  [x] Not Clinically Significant  Hyaline Cast 1-2                                     [] Clinically Significant  [x] Not Clinically Significant Squamous Epithelial Cells  3-5               [] Clinically Significant  [x] Not Clinically Significant Mucus 1+                                                [] Clinically Significant  [x] Not Clinically Significant  Urine Chemistry: Albumin Creatinine Ratio 29 mg/g                 [] Clinically Significant  [x] Not Clinically Significant  Coagulation :  Prothrombin Time (PT) 11.7 Sec               []   Clinically Significant  [x] Not Clinically Significant    Any further  action needed to be taken per the PI?  Yes  A1c is poorly controlled >10%. Liver enzymes, Alk phos and GGT abnormal, ?biliary disease - would share with PCP - also, HS-CRP is very high, suggesting significant inflammation.  Message sent to Dr Marcine Matar with lab results. Message left for Amy Barsoum to call me back, so she can follow up with her PCP.   Chrystie Nose, MD, [facility] FACP  [place]  [facility]  Medical Director of the [facility]  Card[facility]ovascular [facility] Diplomate of the American Board of Clinical Lipidology Attending Cardiologist  Direct Dial: [phone]  Fax: [phone]  Website:  [email or web address]    TREATMENT DAY 365 - STUDY WEEK 53    Subject Number: [id number]           Randomization Number:[id number]          Date:18-Dec-2022      [x] Vital Signs Collected - Blood Pressure: 121/85 - Weight:140 lbs - Heart Rate:85 - Respiratory Rate:16 - Temperature:97.9 - Oxygen Saturation:97%  [x]  Physical Exam Completed by PI or SUB-I Dr Riley Kill   [x]  12-lead ECG  [x]  Extended Urinalysis   [x]  Lab collection per protocol  [x]   (abdominal pain only) since last visit  [x]  Assessment of ER Visits, Hospitalizations, and Inpatient Days  [x]  Adverse Events and Concomitant Medications  [x]  Diet, Lifestyle, and Alcohol Counseling   Amy Glass is here for Week 53 Day 365 of Essence. She reports no abd pain, no changes in her meds and no visits to the Ed or urgent care since last seen.  VS taken at 0952. Dr Riley Kill did exam. Blood work drawn at 1000 Urine obtained at [phone].next visit will be a phone call. Scheduled for June 24th.    Current Outpatient Medications:    Accu-Chek Softclix Lancets lancets, Use to check blood sugar TID. E11.65, Disp: 100 each, Rfl: 3   amLODipine (NORVASC) 10 MG tablet, TAKE 1 TABLET EVERY DAY, Disp: 90 tablet, Rfl: 1   amphetamine-dextroamphetamine (ADDERALL) 20 MG tablet, Take 20 mg by mouth 2  (two) times daily., Disp: , Rfl:    ARIPiprazole (ABILIFY) 2 MG tablet, Take 2 mg by mouth daily., Disp: , Rfl:    aspirin EC 81 MG tablet, Take 81 mg by mouth daily. Swallow whole., Disp: , Rfl:    atorvastatin (LIPITOR) 20 MG tablet, TAKE 1 TABLET EVERY DAY, Disp: 90 tablet, Rfl: 0   benzonatate (TESSALON) 100 MG capsule, Take 1 capsule (100 mg total) by mouth 2 (two) times daily as needed for cough. (Patient not taking: Reported on 09/26/2022), Disp: 20 capsule, Rfl: 0   Blood Glucose Monitoring Suppl (ACCU-CHEK GUIDE) w/Device KIT, Use to check blood sugar TID. E11.65, Disp: 1 kit, Rfl: 0   Continuous Blood Gluc Receiver (FREESTYLE LIBRE 3 READER) DEVI, 1 each by Does not apply route daily., Disp: 1 each, Rfl: 0   Continuous Blood Gluc Sensor (FREESTYLE LIBRE 3 SENSOR) MISC, Change Q 2 weeks, Disp: 2 each, Rfl: 6   desvenlafaxine (PRISTIQ) 50 MG 24 hr tablet, Take 50 mg by mouth daily., Disp: , Rfl:    fexofenadine (ALLEGRA) 180 MG tablet, Take 1 tablet (180 mg total) by mouth daily. (Patient not taking: Reported on 09/26/2022), Disp: 90 tablet, Rfl: 1   glucose blood (ACCU-CHEK GUIDE) test strip, USE AS DIRECTED TO TEST BLOOD SUGAR ONCE DAILY, Disp: 100 strip, Rfl: 1  hydrOXYzine (ATARAX) 25 MG tablet, Take 25 mg by mouth daily., Disp: , Rfl:    insulin glargine (LANTUS SOLOSTAR) 100 UNIT/ML Solostar Pen, Inject 18 Units into the skin daily. Discontinue Evaristo Bury, Disp: 15 mL, Rfl: 11   Insulin Pen Needle (PEN NEEDLES) 32G X 4 MM MISC, Use to inject insulin once daily., Disp: 100 each, Rfl: 1   losartan (COZAAR) 100 MG tablet, TAKE 1 TABLET EVERY DAY, Disp: 90 tablet, Rfl: 1   Magnesium 400 MG CAPS, Take 400 mg by mouth daily., Disp: 30 capsule, Rfl: 1   Multiple Vitamins-Minerals (MULTIVITAMIN WITH MINERALS) tablet, Take 1 tablet by mouth daily., Disp: , Rfl:    nystatin cream (MYCOSTATIN), Apply 1 application topically 2 (two) times daily. (Patient taking differently: Apply 1 application   topically 2 (two) times daily as needed for dry skin.), Disp: 30 g, Rfl: 2   Olopatadine HCl (PATADAY) 0.2 % SOLN, Place 1 drop into both eyes daily as needed (allergies)., Disp: 2.5 mL, Rfl: 5   omeprazole (PRILOSEC) 40 MG capsule, Take 1 capsule (40 mg total) by mouth daily., Disp: 90 capsule, Rfl: 3   sitaGLIPtin-metformin (JANUMET) 50-1000 MG tablet, Take 1 tablet by mouth 2 (two) times daily with a meal., Disp: 180 tablet, Rfl: 4   Study - ESSENCE - olezarsen 50 mg, 80 mg or placebo SQ injection (PI-Hilty), Inject 80 mg into the skin every 28 (twenty-eight) days. For Investigational Use Only. Injection subcutaneously in protocol approved injection sites (abdomen, thigh or outer area of upper arm) every 4 weeks in clinic. Please contact  [facility] for any questions or concerns regarding this medication., Disp: , Rfl:    traMADol-acetaminophen (ULTRACET) 37.5-325 MG tablet, , Disp: , Rfl:    traZODone (DESYREL) 100 MG tablet, Take 1 tablet (100 mg total) by mouth at bedtime as needed for sleep. (Patient taking differently: Take 100 mg by mouth at bedtime.), Disp: 90 tablet, Rfl: 2   VASCEPA 1 g capsule, TAKE 2 CAPSULES TWICE DAILY, Disp: 360 capsule, Rfl: 3  Current Facility-Administered Medications:    Study - ESSENCE - olezarsen 50 mg, 80 mg or placebo SQ injection (PI-Hilty), 80 mg, Subcutaneous, Q28 days, Chrystie Nose, MD, 80 mg at 11/20/22 [phone]

## 2022-12-23 DIAGNOSIS — F411 Generalized anxiety disorder: Secondary | ICD-10-CM | POA: Diagnosis not present

## 2022-12-23 DIAGNOSIS — F331 Major depressive disorder, recurrent, moderate: Secondary | ICD-10-CM | POA: Diagnosis not present

## 2022-12-23 DIAGNOSIS — F900 Attention-deficit hyperactivity disorder, predominantly inattentive type: Secondary | ICD-10-CM | POA: Diagnosis not present

## 2022-12-24 ENCOUNTER — Encounter: Payer: Self-pay | Admitting: *Deleted

## 2022-12-24 DIAGNOSIS — Z006 Encounter for examination for normal comparison and control in clinical research program: Secondary | ICD-10-CM

## 2022-12-24 NOTE — Research (Signed)
Spoke with Ms Odem about following up with her PCP about her labs. She states she will call Dr Laural Benes.

## 2022-12-25 ENCOUNTER — Encounter: Payer: Self-pay | Admitting: Internal Medicine

## 2022-12-25 ENCOUNTER — Telehealth: Payer: Self-pay | Admitting: Internal Medicine

## 2022-12-25 NOTE — Telephone Encounter (Signed)
-----   Message from Franchot Heidelberg, RN sent at 12/24/2022 10:34 AM EDT ----- Regarding: research labs. Dr Laural Benes, Dr Rennis Golden wanted me to let you know about Ms Blackie's labs.  Magnesium was 1.5   ALT/SGOT was 118 AST/SGOT was 74 Alkaline Phosphatase  was 178 Gamma Glutamyl Transferase was 329 Hs-C-Reactive Protein 13.9  Hemoglobin A1c was 10.8  All above labs done as part of the Essence research study she is in. Drawn Dec 18, 2022 Please call or message with any questions.   Thanks,    Gillian Shields, RN, BSN, RN-BC Administrator, [employer] Email: [email or web address] Office number: [phone] Fax: [phone]

## 2022-12-26 DIAGNOSIS — M5416 Radiculopathy, lumbar region: Secondary | ICD-10-CM | POA: Diagnosis not present

## 2022-12-26 DIAGNOSIS — K7460 Unspecified cirrhosis of liver: Secondary | ICD-10-CM | POA: Diagnosis not present

## 2022-12-26 DIAGNOSIS — G629 Polyneuropathy, unspecified: Secondary | ICD-10-CM | POA: Diagnosis not present

## 2022-12-26 DIAGNOSIS — Z0130 Encounter for examination of blood pressure without abnormal findings: Secondary | ICD-10-CM | POA: Diagnosis not present

## 2022-12-26 DIAGNOSIS — Z79899 Other long term (current) drug therapy: Secondary | ICD-10-CM | POA: Diagnosis not present

## 2022-12-26 DIAGNOSIS — R892 Abnormal level of other drugs, medicaments and biological substances in specimens from other organs, systems and tissues: Secondary | ICD-10-CM | POA: Diagnosis not present

## 2022-12-26 DIAGNOSIS — M539 Dorsopathy, unspecified: Secondary | ICD-10-CM | POA: Diagnosis not present

## 2022-12-26 DIAGNOSIS — Z6828 Body mass index (BMI) 28.0-28.9, adult: Secondary | ICD-10-CM | POA: Diagnosis not present

## 2022-12-30 ENCOUNTER — Encounter: Payer: Self-pay | Admitting: Internal Medicine

## 2022-12-30 ENCOUNTER — Encounter: Payer: Self-pay | Admitting: *Deleted

## 2022-12-30 ENCOUNTER — Ambulatory Visit (HOSPITAL_BASED_OUTPATIENT_CLINIC_OR_DEPARTMENT_OTHER): Payer: Medicare HMO | Admitting: Pharmacist

## 2022-12-30 ENCOUNTER — Ambulatory Visit: Payer: Medicare HMO | Attending: Internal Medicine | Admitting: Internal Medicine

## 2022-12-30 VITALS — BP 136/90 | HR 87 | Temp 98.4°F | Ht 59.0 in | Wt 141.0 lb

## 2022-12-30 DIAGNOSIS — R7989 Other specified abnormal findings of blood chemistry: Secondary | ICD-10-CM | POA: Diagnosis not present

## 2022-12-30 DIAGNOSIS — E1159 Type 2 diabetes mellitus with other circulatory complications: Secondary | ICD-10-CM | POA: Diagnosis not present

## 2022-12-30 DIAGNOSIS — Z794 Long term (current) use of insulin: Secondary | ICD-10-CM

## 2022-12-30 DIAGNOSIS — E785 Hyperlipidemia, unspecified: Secondary | ICD-10-CM | POA: Diagnosis not present

## 2022-12-30 DIAGNOSIS — F331 Major depressive disorder, recurrent, moderate: Secondary | ICD-10-CM | POA: Diagnosis not present

## 2022-12-30 DIAGNOSIS — I152 Hypertension secondary to endocrine disorders: Secondary | ICD-10-CM | POA: Diagnosis not present

## 2022-12-30 DIAGNOSIS — F900 Attention-deficit hyperactivity disorder, predominantly inattentive type: Secondary | ICD-10-CM | POA: Diagnosis not present

## 2022-12-30 DIAGNOSIS — Z006 Encounter for examination for normal comparison and control in clinical research program: Secondary | ICD-10-CM

## 2022-12-30 DIAGNOSIS — E119 Type 2 diabetes mellitus without complications: Secondary | ICD-10-CM

## 2022-12-30 DIAGNOSIS — F411 Generalized anxiety disorder: Secondary | ICD-10-CM | POA: Diagnosis not present

## 2022-12-30 DIAGNOSIS — E1165 Type 2 diabetes mellitus with hyperglycemia: Secondary | ICD-10-CM

## 2022-12-30 DIAGNOSIS — E1169 Type 2 diabetes mellitus with other specified complication: Secondary | ICD-10-CM | POA: Diagnosis not present

## 2022-12-30 LAB — POCT GLYCOSYLATED HEMOGLOBIN (HGB A1C): HbA1c, POC (controlled diabetic range): 10.7 % — AB (ref 0.0–7.0)

## 2022-12-30 LAB — GLUCOSE, POCT (MANUAL RESULT ENTRY): POC Glucose: 254 mg/dl — AB (ref 70–99)

## 2022-12-30 MED ORDER — LANTUS SOLOSTAR 100 UNIT/ML ~~LOC~~ SOPN: 22.0000 [IU] | PEN_INJECTOR | Freq: Every day | SUBCUTANEOUS | 11 refills | Status: DC

## 2022-12-30 MED ORDER — SEMAGLUTIDE(0.25 OR 0.5MG/DOS) 2 MG/3ML ~~LOC~~ SOPN
0.2500 mg | PEN_INJECTOR | SUBCUTANEOUS | 1 refills | Status: DC
Start: 2022-12-30 — End: 2023-03-20

## 2022-12-30 MED ORDER — METFORMIN HCL ER 500 MG PO TB24
ORAL_TABLET | ORAL | 1 refills | Status: DC
Start: 2022-12-30 — End: 2023-06-09

## 2022-12-30 NOTE — Progress Notes (Signed)
Patient educated on administration, purpose, proper use and potential adverse effects of Ozempic. Also reviewed goal blood glucose levels. Patient was able to demonstrate use. All questions and concerns were addressed. Following instruction patient verbalized understanding of treatment plan.     Valeda Malm, Pharm.D. PGY-2 Ambulatory Care Pharmacy Resident

## 2022-12-30 NOTE — Patient Instructions (Addendum)
Increase Lantus insulin to 22 units daily.  After 2 days, if your morning blood sugars are still greater than 200, increase Lantus insulin to 24 units.  -Start Ozempic 0.25 mg once a week as discussed today.  If you develop any vomiting, pain in the upper abdomen, severe diarrhea, please stop the medication and give [place] a call.  The medication can cause some nausea.  Stop Janumet once you get the Metformin XR.  Start metformin XR 500 mg taking 2 tablets in the mornings and 2 tablets in the evenings.  STOP Atorvastatin.  Return to lab in 2 weeks for recheck of liver function test.  I have sent your prescriptions to [facility] which is a mail order pharmacy.

## 2022-12-30 NOTE — Research (Signed)
Amy Glass called and asked that we fax her last lab results to Dr. Laural Benes office.Lab results from May 29 faxed to Dr Laural Benes.
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
## 2022-12-31 ENCOUNTER — Telehealth: Payer: Self-pay | Admitting: Internal Medicine

## 2022-12-31 DIAGNOSIS — Z6828 Body mass index (BMI) 28.0-28.9, adult: Secondary | ICD-10-CM | POA: Diagnosis not present

## 2022-12-31 DIAGNOSIS — R7989 Other specified abnormal findings of blood chemistry: Secondary | ICD-10-CM

## 2022-12-31 DIAGNOSIS — Z79899 Other long term (current) drug therapy: Secondary | ICD-10-CM | POA: Diagnosis not present

## 2022-12-31 DIAGNOSIS — M5416 Radiculopathy, lumbar region: Secondary | ICD-10-CM | POA: Diagnosis not present

## 2022-12-31 DIAGNOSIS — G629 Polyneuropathy, unspecified: Secondary | ICD-10-CM | POA: Diagnosis not present

## 2022-12-31 DIAGNOSIS — K7460 Unspecified cirrhosis of liver: Secondary | ICD-10-CM | POA: Diagnosis not present

## 2022-12-31 DIAGNOSIS — Z0130 Encounter for examination of blood pressure without abnormal findings: Secondary | ICD-10-CM | POA: Diagnosis not present

## 2022-12-31 DIAGNOSIS — M539 Dorsopathy, unspecified: Secondary | ICD-10-CM | POA: Diagnosis not present

## 2022-12-31 LAB — HEPATIC FUNCTION PANEL
ALT: 126 IU/L — ABNORMAL HIGH (ref 0–32)
AST: 87 IU/L — ABNORMAL HIGH (ref 0–40)
Albumin: 4.8 g/dL (ref 3.9–4.9)
Alkaline Phosphatase: 204 IU/L — ABNORMAL HIGH (ref 44–121)
Bilirubin Total: 0.3 mg/dL (ref 0.0–1.2)
Bilirubin, Direct: 0.13 mg/dL (ref 0.00–0.40)
Total Protein: 7.7 g/dL (ref 6.0–8.5)

## 2022-12-31 LAB — HEPATITIS B SURFACE ANTIGEN: Hepatitis B Surface Ag: NEGATIVE

## 2022-12-31 LAB — HEPATITIS C ANTIBODY: Hep C Virus Ab: NONREACTIVE

## 2022-12-31 LAB — HEPATITIS B SURFACE ANTIBODY, QUANTITATIVE: Hepatitis B Surf Ab Quant: 9.9 m[IU]/mL — ABNORMAL LOW (ref >9.9)

## 2022-12-31 NOTE — Telephone Encounter (Signed)
Phone call placed to patient today.  She was at [facility] seeing her pain specialist and was just ending her visit with him. I informed her that I did not receive the report of the ultrasound that she had done at [facility]  Her pain specialist was able to tell me that she did have a liver ultrasound done that showed fatty liver.  This was followed by a FibroScan that showed a Fib score of 4 suggesting advance fibrosis.  Pt will bring hard copy to me tomorrow and leave at front desk. Reminded patient to hold off on taking the atorvastatin and returning to the lab in 2 weeks for recheck of liver function test.  Advised that screen for hepatitis B and hepatitis C were negative.

## 2023-01-02 ENCOUNTER — Encounter: Payer: Self-pay | Admitting: Internal Medicine

## 2023-01-02 NOTE — Progress Notes (Signed)
I received patient's results from [facility].  She had right upper quadrant ultrasound done 10/25/2022.  This revealed hepatomegaly with hepatic steatosis.  Single gallstone.  Elastography/FibroScan showed steatosis grade of S2/314 and fibrosis stage of F4/18.6. Labs collected 10/16/2022 showed AST of 102 and ALT of 126, ceruloplasmin level of 23 hepatitis A antibody IgG positive, hepatitis a IgM equivocal.  Antimitochondrial antibody less than 20, iron level 85/TIBC 446. LFTs done 12/18/2022 showed AST of 92 and ALT of 126.

## 2023-01-06 DIAGNOSIS — F411 Generalized anxiety disorder: Secondary | ICD-10-CM | POA: Diagnosis not present

## 2023-01-06 DIAGNOSIS — F900 Attention-deficit hyperactivity disorder, predominantly inattentive type: Secondary | ICD-10-CM | POA: Diagnosis not present

## 2023-01-06 DIAGNOSIS — F331 Major depressive disorder, recurrent, moderate: Secondary | ICD-10-CM | POA: Diagnosis not present

## 2023-01-09 DIAGNOSIS — K760 Fatty (change of) liver, not elsewhere classified: Secondary | ICD-10-CM | POA: Diagnosis not present

## 2023-01-09 DIAGNOSIS — K7581 Nonalcoholic steatohepatitis (NASH): Secondary | ICD-10-CM | POA: Diagnosis not present

## 2023-01-09 DIAGNOSIS — K7589 Other specified inflammatory liver diseases: Secondary | ICD-10-CM | POA: Diagnosis not present

## 2023-01-09 DIAGNOSIS — R748 Abnormal levels of other serum enzymes: Secondary | ICD-10-CM | POA: Diagnosis not present

## 2023-01-12 ENCOUNTER — Other Ambulatory Visit: Payer: Self-pay | Admitting: Internal Medicine

## 2023-01-12 DIAGNOSIS — I10 Essential (primary) hypertension: Secondary | ICD-10-CM

## 2023-01-13 ENCOUNTER — Encounter: Payer: Medicare HMO | Admitting: *Deleted

## 2023-01-13 ENCOUNTER — Encounter: Payer: Self-pay | Admitting: *Deleted

## 2023-01-13 DIAGNOSIS — F900 Attention-deficit hyperactivity disorder, predominantly inattentive type: Secondary | ICD-10-CM | POA: Diagnosis not present

## 2023-01-13 DIAGNOSIS — Z006 Encounter for examination for normal comparison and control in clinical research program: Secondary | ICD-10-CM

## 2023-01-13 DIAGNOSIS — F411 Generalized anxiety disorder: Secondary | ICD-10-CM | POA: Diagnosis not present

## 2023-01-13 DIAGNOSIS — F331 Major depressive disorder, recurrent, moderate: Secondary | ICD-10-CM | POA: Diagnosis not present

## 2023-01-13 NOTE — Research (Addendum)
Spoke with Amy Glass for follow [facility] phone call. She states she feels fine. No changes in her meds, no abd pain, and no visits to the Ed or Urgent care. She does report having a liver biopsy on June 20. No report back yet. Biopsies were taken from the left hepatic lobe. Reviewed lifestyle and medication compliance.  Scheduled next follow up phone call for July 22.     Current Outpatient Medications:    Accu-Chek Softclix Lancets lancets, Use to check blood sugar TID. E11.65, Disp: 100 each, Rfl: 3   amLODipine (NORVASC) 10 MG tablet, TAKE 1 TABLET EVERY DAY, Disp: 90 tablet, Rfl: 1   amphetamine-dextroamphetamine (ADDERALL) 20 MG tablet, Take 20 mg by mouth 2 (two) times daily., Disp: , Rfl:    ARIPiprazole (ABILIFY) 2 MG tablet, Take 2 mg by mouth daily., Disp: , Rfl:    aspirin EC 81 MG tablet, Take 81 mg by mouth daily. Swallow whole., Disp: , Rfl:    benzonatate (TESSALON) 100 MG capsule, Take 1 capsule (100 mg total) by mouth 2 (two) times daily as needed for cough., Disp: 20 capsule, Rfl: 0   Blood Glucose Monitoring Suppl (ACCU-CHEK GUIDE) w/Device KIT, Use to check blood sugar TID. E11.65, Disp: 1 kit, Rfl: 0   Continuous Blood Gluc Receiver (FREESTYLE LIBRE 3 READER) DEVI, 1 each by Does not apply route daily., Disp: 1 each, Rfl: 0   Continuous Blood Gluc Sensor (FREESTYLE LIBRE 3 SENSOR) MISC, Change Q 2 weeks, Disp: 2 each, Rfl: 6   desvenlafaxine (PRISTIQ) 50 MG 24 hr tablet, Take 50 mg by mouth daily., Disp: , Rfl:    fexofenadine (ALLEGRA) 180 MG tablet, Take 1 tablet (180 mg total) by mouth daily., Disp: 90 tablet, Rfl: 1   glucose blood (ACCU-CHEK GUIDE) test strip, USE AS DIRECTED TO TEST BLOOD SUGAR ONCE DAILY, Disp: 100 strip, Rfl: 1   hydrOXYzine (ATARAX) 25 MG tablet, Take 25 mg by mouth daily., Disp: , Rfl:    insulin glargine (LANTUS SOLOSTAR) 100 UNIT/ML Solostar Pen, Inject 22 Units into the skin daily. Discontinue Evaristo Bury, Disp: 15 mL, Rfl: 11   Insulin Pen Needle (PEN  NEEDLES) 32G X 4 MM MISC, Use to inject insulin once daily., Disp: 100 each, Rfl: 1   losartan (COZAAR) 100 MG tablet, TAKE 1 TABLET EVERY DAY, Disp: 90 tablet, Rfl: 1   Magnesium 400 MG CAPS, Take 400 mg by mouth daily., Disp: 30 capsule, Rfl: 1   metFORMIN (GLUCOPHAGE-XR) 500 MG 24 hr tablet, 2 tabs PO twice a day.  Stop Janumet, Disp: 360 tablet, Rfl: 1   Multiple Vitamins-Minerals (MULTIVITAMIN WITH MINERALS) tablet, Take 1 tablet by mouth daily., Disp: , Rfl:    nystatin cream (MYCOSTATIN), Apply 1 application topically 2 (two) times daily. (Patient taking differently: Apply 1 application  topically 2 (two) times daily as needed for dry skin.), Disp: 30 g, Rfl: 2   Olopatadine HCl (PATADAY) 0.2 % SOLN, Place 1 drop into both eyes daily as needed (allergies)., Disp: 2.5 mL, Rfl: 5   omeprazole (PRILOSEC) 40 MG capsule, Take 1 capsule (40 mg total) by mouth daily., Disp: 90 capsule, Rfl: 3   Semaglutide,0.25 or 0.5MG /DOS, 2 MG/3ML SOPN, Inject 0.25 mg into the skin once a week., Disp: 3 mL, Rfl: 1   Study - [facility] - olezarsen 50 mg, 80 mg or placebo SQ injection (PI-Hilty), Inject 80 mg into the skin every 28 (twenty-eight) days. For Investigational Use Only. Injection subcutaneously in protocol approved injection  sites (abdomen, thigh or outer area of upper arm) every 4 weeks in clinic. Please contact  [facility] for any questions or concerns regarding this medication., Disp: , Rfl:    traMADol-acetaminophen (ULTRACET) 37.5-325 MG tablet, , Disp: , Rfl:    traZODone (DESYREL) 100 MG tablet, Take 1 tablet (100 mg total) by mouth at bedtime as needed for sleep. (Patient taking differently: Take 100 mg by mouth at bedtime.), Disp: 90 tablet, Rfl: 2   VASCEPA 1 g capsule, TAKE 2 CAPSULES TWICE DAILY, Disp: 360 capsule, Rfl: 3  Current Facility-Administered Medications:    Study - [facility] - olezarsen 50 mg, 80 mg or placebo SQ injection (PI-Hilty), 80 mg,  Subcutaneous, Q28 days, Chrystie Nose, MD, 80 mg at 11/20/22 [phone]

## 2023-01-13 NOTE — Research (Signed)
Follow- up phone call complete.

## 2023-01-13 NOTE — Telephone Encounter (Signed)
Requested Prescriptions  Pending Prescriptions Disp Refills   losartan (COZAAR) 100 MG tablet [Pharmacy Med Name: LOSARTAN POTASSIUM 100 MG Tablet] 30 tablet 0    Sig: TAKE 1 TABLET EVERY DAY     Cardiovascular:  Angiotensin Receptor Blockers Failed - 01/12/2023  1:58 AM      Failed - Cr in normal range and within 180 days    Creat  Date Value Ref Range Status  12/21/2015 0.56 0.50 - 1.05 mg/dL Final   Creatinine, Ser  Date Value Ref Range Status  05/28/2022 0.56 0.44 - 1.00 mg/dL Final         Failed - K in normal range and within 180 days    Potassium  Date Value Ref Range Status  05/28/2022 4.5 3.5 - 5.1 mmol/L Final         Failed - Last BP in normal range    BP Readings from Last 1 Encounters:  12/30/22 (!) 136/90         Passed - Patient is not pregnant      Passed - Valid encounter within last 6 months    Recent Outpatient Visits           2 weeks ago Diabetes mellitus without complication [facility])   [place] [facility] [place], [place] L, RPH-CPP   2 weeks ago Uncontrolled diabetes mellitus with hyperglycemia, with long-term current use of insulin (HCC)   [place] [facility] Jon[facility] Blue B, MD   4 months ago Uncontrolled diabetes mellitus with hyperglycemia, with long-term current use of insulin [facility])   [place] [facility] Jonah Blue B, MD   6 months ago Uncontrolled type 2 diabetes mellitus with hyperglycemia, without long-term current use of insulin [facility])   [place] [facility] Jonah Blue B, MD   10 months ago Uncontrolled type 2 diabetes mellitus with hyperglycemia, without long-term current use of insulin [facility])   [place] [facility] [place], [place], [place]

## 2023-01-13 NOTE — Telephone Encounter (Signed)
Called patient to schedule office visit with PCP. Patient on her way into work and was unable to schedule appointment but stated she will call tomorrow to get an appointment. Medication refilled for 30-day supply today.

## 2023-01-15 ENCOUNTER — Other Ambulatory Visit: Payer: Medicare HMO

## 2023-01-20 DIAGNOSIS — F900 Attention-deficit hyperactivity disorder, predominantly inattentive type: Secondary | ICD-10-CM | POA: Diagnosis not present

## 2023-01-20 DIAGNOSIS — F411 Generalized anxiety disorder: Secondary | ICD-10-CM | POA: Diagnosis not present

## 2023-01-20 DIAGNOSIS — F331 Major depressive disorder, recurrent, moderate: Secondary | ICD-10-CM | POA: Diagnosis not present

## 2023-01-22 ENCOUNTER — Other Ambulatory Visit: Payer: Self-pay | Admitting: Internal Medicine

## 2023-01-22 DIAGNOSIS — K760 Fatty (change of) liver, not elsewhere classified: Secondary | ICD-10-CM | POA: Diagnosis not present

## 2023-01-22 DIAGNOSIS — K754 Autoimmune hepatitis: Secondary | ICD-10-CM | POA: Diagnosis not present

## 2023-01-22 NOTE — Telephone Encounter (Signed)
No longer listed on current medication list Requested Prescriptions  Pending Prescriptions Disp Refills   atorvastatin (LIPITOR) 20 MG tablet [Pharmacy Med Name: ATORVASTATIN CALCIUM 20 MG Tablet] 90 tablet 3    Sig: TAKE 1 TABLET EVERY DAY     Cardiovascular:  Antilipid - Statins Failed - 01/22/2023 10:45 AM      Failed - Lipid Panel in normal range within the last 12 months    Cholesterol, Total  Date Value Ref Range Status  01/24/2021 171 100 - 199 mg/dL Final   LDL Chol Calc (NIH)  Date Value Ref Range Status  01/24/2021 54 0 - 99 mg/dL Final   Direct LDL  Date Value Ref Range Status  09/21/2019 89.0 0 - 99 mg/dL Final    Comment:    Performed at [name], [address], [place], [place] [address]   HDL  Date Value Ref Range Status  01/24/2021 27 (Glass) >39 mg/dL Final   Triglycerides  Date Value Ref Range Status  01/24/2021 610 (HH) 0 - 149 mg/dL Final         Passed - Patient is not pregnant      Passed - Valid encounter within last 12 months    Recent Outpatient Visits           3 weeks ago Diabetes mellitus without complication [facility])   Amy Glass, Amy Glass, Amy Glass   3 weeks ago Uncontrolled diabetes mellitus with hyperglycemia, with long-term current use of insulin [facility])   Amy [facility] Jonah Blue Glass, Amy Glass   5 months ago Uncontrolled diabetes mellitus with hyperglycemia, with long-term current use of insulin [facility])   Amy [facility] Jonah Blue Glass, Amy Glass   7 months ago Uncontrolled type 2 diabetes mellitus with hyperglycemia, without long-term current use of insulin [facility])   Amy [facility] Jonah Blue Glass, Amy Glass   10 months ago Uncontrolled type 2 diabetes mellitus with hyperglycemia, without long-term current use of insulin [facility])   Amy Glass, Amy Glass, Amy Glass        Future Appointments             In 3 months Amy Glass Benes, Binnie Rail, Amy Glass [facility]

## 2023-01-29 DIAGNOSIS — F411 Generalized anxiety disorder: Secondary | ICD-10-CM | POA: Diagnosis not present

## 2023-01-29 DIAGNOSIS — F331 Major depressive disorder, recurrent, moderate: Secondary | ICD-10-CM | POA: Diagnosis not present

## 2023-01-29 DIAGNOSIS — F900 Attention-deficit hyperactivity disorder, predominantly inattentive type: Secondary | ICD-10-CM | POA: Diagnosis not present

## 2023-01-30 DIAGNOSIS — F900 Attention-deficit hyperactivity disorder, predominantly inattentive type: Secondary | ICD-10-CM | POA: Diagnosis not present

## 2023-01-30 DIAGNOSIS — F411 Generalized anxiety disorder: Secondary | ICD-10-CM | POA: Diagnosis not present

## 2023-01-30 DIAGNOSIS — F331 Major depressive disorder, recurrent, moderate: Secondary | ICD-10-CM | POA: Diagnosis not present

## 2023-02-01 DIAGNOSIS — Z79899 Other long term (current) drug therapy: Secondary | ICD-10-CM | POA: Diagnosis not present

## 2023-02-01 DIAGNOSIS — R030 Elevated blood-pressure reading, without diagnosis of hypertension: Secondary | ICD-10-CM | POA: Diagnosis not present

## 2023-02-01 DIAGNOSIS — M5416 Radiculopathy, lumbar region: Secondary | ICD-10-CM | POA: Diagnosis not present

## 2023-02-01 DIAGNOSIS — G629 Polyneuropathy, unspecified: Secondary | ICD-10-CM | POA: Diagnosis not present

## 2023-02-01 DIAGNOSIS — Z6827 Body mass index (BMI) 27.0-27.9, adult: Secondary | ICD-10-CM | POA: Diagnosis not present

## 2023-02-01 DIAGNOSIS — M539 Dorsopathy, unspecified: Secondary | ICD-10-CM | POA: Diagnosis not present

## 2023-02-01 DIAGNOSIS — K7460 Unspecified cirrhosis of liver: Secondary | ICD-10-CM | POA: Diagnosis not present

## 2023-02-04 ENCOUNTER — Other Ambulatory Visit: Payer: Self-pay | Admitting: Internal Medicine

## 2023-02-04 DIAGNOSIS — I10 Essential (primary) hypertension: Secondary | ICD-10-CM

## 2023-02-05 DIAGNOSIS — F900 Attention-deficit hyperactivity disorder, predominantly inattentive type: Secondary | ICD-10-CM | POA: Diagnosis not present

## 2023-02-05 DIAGNOSIS — F411 Generalized anxiety disorder: Secondary | ICD-10-CM | POA: Diagnosis not present

## 2023-02-05 DIAGNOSIS — F331 Major depressive disorder, recurrent, moderate: Secondary | ICD-10-CM | POA: Diagnosis not present

## 2023-02-10 ENCOUNTER — Encounter: Payer: Self-pay | Admitting: *Deleted

## 2023-02-10 DIAGNOSIS — Z006 Encounter for examination for normal comparison and control in clinical research program: Secondary | ICD-10-CM

## 2023-02-10 NOTE — Research (Signed)
Message left for Ms Balcom to call back. This was a follow up phone call for [employer] research.

## 2023-02-11 ENCOUNTER — Encounter: Payer: Self-pay | Admitting: *Deleted

## 2023-02-11 DIAGNOSIS — Z006 Encounter for examination for normal comparison and control in clinical research program: Secondary | ICD-10-CM

## 2023-02-11 NOTE — Research (Signed)
Text sent to Ms Rief. She text that no abd pain, no changes in her meds, and no visits to the Ed or urgent care. This is a follow up for [employer] research study.

## 2023-02-17 DIAGNOSIS — F900 Attention-deficit hyperactivity disorder, predominantly inattentive type: Secondary | ICD-10-CM | POA: Diagnosis not present

## 2023-02-17 DIAGNOSIS — F411 Generalized anxiety disorder: Secondary | ICD-10-CM | POA: Diagnosis not present

## 2023-02-17 DIAGNOSIS — F331 Major depressive disorder, recurrent, moderate: Secondary | ICD-10-CM | POA: Diagnosis not present

## 2023-02-20 ENCOUNTER — Ambulatory Visit: Payer: Medicare HMO | Attending: Physician Assistant | Admitting: Physician Assistant

## 2023-02-20 ENCOUNTER — Encounter: Payer: Self-pay | Admitting: Physician Assistant

## 2023-02-20 VITALS — BP 115/81 | HR 111 | Wt 138.0 lb

## 2023-02-20 DIAGNOSIS — Z794 Long term (current) use of insulin: Secondary | ICD-10-CM

## 2023-02-20 DIAGNOSIS — K760 Fatty (change of) liver, not elsewhere classified: Secondary | ICD-10-CM

## 2023-02-20 DIAGNOSIS — R748 Abnormal levels of other serum enzymes: Secondary | ICD-10-CM | POA: Diagnosis not present

## 2023-02-20 DIAGNOSIS — R7989 Other specified abnormal findings of blood chemistry: Secondary | ICD-10-CM

## 2023-02-20 DIAGNOSIS — E1165 Type 2 diabetes mellitus with hyperglycemia: Secondary | ICD-10-CM | POA: Diagnosis not present

## 2023-02-20 LAB — GLUCOSE, POCT (MANUAL RESULT ENTRY): POC Glucose: 152 mg/dl — AB (ref 70–99)

## 2023-02-20 NOTE — Patient Instructions (Signed)
Avoid drinking alcohol and taking products containing tylenol/acetaminophen  Drink 80 ounces water daily  Fatty Liver Disease  The liver converts food into energy, removes toxic material from the blood, makes important proteins, and absorbs necessary vitamins from food. Fatty liver disease occurs when too much fat has built up in your liver cells. Fatty liver disease is also called hepatic steatosis. In many cases, fatty liver disease does not cause symptoms or problems. It is often diagnosed when tests are being done for other reasons. However, over time, fatty liver can cause inflammation that may lead to more serious liver problems, such as scarring of the liver (cirrhosis) and liver failure. Fatty liver is associated with insulin resistance, increased body fat, high blood pressure (hypertension), and high cholesterol. These are features of metabolic syndrome and increase your risk for stroke, diabetes, and heart disease. What are the causes? This condition may be caused by components of metabolic syndrome: Obesity. Insulin resistance. High cholesterol. Other causes: Alcohol abuse. Poor nutrition. Cushing syndrome. Pregnancy. Certain drugs. Poisons. Some viral infections. What increases the risk? You are more likely to develop this condition if you: Abuse alcohol. Are overweight. Have diabetes. Have hepatitis. Have a high triglyceride level. Are pregnant. What are the signs or symptoms? Fatty liver disease often does not cause symptoms. If symptoms do develop, they can include: Fatigue and weakness. Weight loss. Confusion. Nausea, vomiting, or abdominal pain. Yellowing of your skin and the white parts of your eyes (jaundice). Itchy skin. How is this diagnosed? This condition may be diagnosed by: A physical exam and your medical history. Blood tests. Imaging tests, such as an ultrasound, CT scan, or MRI. A liver biopsy. A small sample of liver tissue is removed using a  needle. The sample is then looked at under a microscope. How is this treated? Fatty liver disease is often caused by other health conditions. Treatment for fatty liver may involve medicines and lifestyle changes to manage conditions such as: Alcoholism. High cholesterol. Diabetes. Being overweight or obese. Follow these instructions at home:  Do not drink alcohol. If you have trouble quitting, ask your health care provider how to safely quit with the help of medicine or a supervised program. This is important to keep your condition from getting worse. Eat a healthy diet as told by your health care provider. Ask your health care provider about working with a dietitian to develop an eating plan. Exercise regularly. This can help you lose weight and control your cholesterol and diabetes. Talk to your health care provider about an exercise plan and which activities are best for you. Take over-the-counter and prescription medicines only as told by your health care provider. Keep all follow-up visits. This is important. Contact a health care provider if: You have trouble controlling your: Blood sugar. This is especially important if you have diabetes. Cholesterol. Drinking of alcohol. Get help right away if: You have abdominal pain. You have jaundice. You have nausea and are vomiting. You vomit blood or material that looks like coffee grounds. You have stools that are black, tar-like, or bloody. Summary Fatty liver disease develops when too much fat builds up in the cells of your liver. Fatty liver disease often causes no symptoms or problems. However, over time, fatty liver can cause inflammation that may lead to more serious liver problems, such as scarring of the liver (cirrhosis). You are more likely to develop this condition if you abuse alcohol, are pregnant, are overweight, have diabetes, have hepatitis, or have high triglyceride  or cholesterol levels. Contact your health care provider  if you have trouble controlling your blood sugar, cholesterol, or drinking of alcohol. This information is not intended to replace advice given to you by your health care provider. Make sure you discuss any questions you have with your health care provider. Document Revised: 04/20/2020 Document Reviewed: 04/20/2020 Elsevier Patient Education  2024 [employer].

## 2023-02-20 NOTE — Progress Notes (Signed)
Patient ID: Amy Glass, female   DOB: [birth year], 62 y.o.   MRN: [id number]   Amy Glass, is a 62 y.o. female  [id number]  [id number]  DOB - 1961/05/10  Chief Complaint  Patient presents with   elevated liver function       Subjective:   Amy Glass is a 62 y.o. female here today for a follow up visit s/p liver biopsy showing grade 2 fatty liver and wants to know next steps.  She denies any N/V.  No abdominal pain.  Labs done about 7 weeks ago.  No lifestyle changes yet.  Last A1c=10.7.    No problems updated.  ALLERGIES: Allergies  Allergen Reactions   Nitroglycerin Other (See Comments)    Migraines, (only tried nitro patch. Has never tried the pills)   Tricor [Fenofibrate]     Stomach upset    PAST MEDICAL HISTORY: Past Medical History:  Diagnosis Date   ADHD (attention deficit hyperactivity disorder)    Allergy    Anxiety    as child    COVID 2020   mild   Depression    as child   Diabetes mellitus without complication (HCC) Dx 2003   Diabetes mellitus, type II (HCC)    Diverticulosis    GERD (gastroesophageal reflux disease)    Hiatal hernia    Hyperlipidemia    Hyperplastic colon polyp    Hypertension Dx 2003   Schatzki's ring     MEDICATIONS AT HOME: Prior to Admission medications   Medication Sig Start Date End Date Taking? Authorizing Provider  Accu-Chek Softclix Lancets lancets Use to check blood sugar TID. E11.65 06/18/22  Yes Marcine Matar, MD  amLODipine (NORVASC) 10 MG tablet TAKE 1 TABLET EVERY DAY 10/05/22  Yes Marcine Matar, MD  amphetamine-dextroamphetamine (ADDERALL) 20 MG tablet Take 20 mg by mouth 2 (two) times daily. 02/14/21  Yes [provider]  aspirin EC 81 MG tablet Take 81 mg by mouth daily. Swallow whole.   Yes [provider]  atorvastatin (LIPITOR) 20 MG tablet TAKE 1 TABLET EVERY DAY 01/22/23  Yes Marcine Matar, MD  benzonatate (TESSALON) 100 MG capsule Take 1 capsule (100 mg  total) by mouth 2 (two) times daily as needed for cough. 06/18/22  Yes Marcine Matar, MD  Blood Glucose Monitoring Suppl (ACCU-CHEK GUIDE) w/Device KIT Use to check blood sugar TID. E11.65 06/18/22  Yes Marcine Matar, MD  Continuous Blood Gluc Receiver (FREESTYLE LIBRE 3 READER) DEVI 1 each by Does not apply route daily. 08/22/22  Yes Marcine Matar, MD  Continuous Blood Gluc Sensor (FREESTYLE LIBRE 3 SENSOR) MISC Change Q 2 weeks 08/22/22  Yes Marcine Matar, MD  desvenlafaxine (PRISTIQ) 50 MG 24 hr tablet Take 50 mg by mouth daily. 02/15/21  Yes [provider]  glucose blood (ACCU-CHEK GUIDE) test strip USE AS DIRECTED TO TEST BLOOD SUGAR ONCE DAILY 11/20/22  Yes Hoy Register, MD  hydrOXYzine (ATARAX) 25 MG tablet Take 25 mg by mouth daily. 03/13/22  Yes [provider]  insulin glargine (LANTUS SOLOSTAR) 100 UNIT/ML Solostar Pen Inject 22 Units into the skin daily. Discontinue Evaristo Bury 12/30/22  Yes Marcine Matar, MD  Insulin Pen Needle (PEN NEEDLES) 32G X 4 MM MISC Use to inject insulin once daily. 05/30/22  Yes Marcine Matar, MD  losartan (COZAAR) 100 MG tablet TAKE 1 TABLET EVERY DAY (NEED MD APPOINTMENT) 02/04/23  Yes Marcine Matar, MD  Magnesium 400 MG CAPS Take  400 mg by mouth daily. 03/20/22  Yes Marcine Matar, MD  metFORMIN (GLUCOPHAGE-XR) 500 MG 24 hr tablet 2 tabs PO twice a day.  Stop Janumet 12/30/22  Yes Marcine Matar, MD  Semaglutide,0.25 or 0.5MG /DOS, 2 MG/3ML SOPN Inject 0.25 mg into the skin once a week. 12/30/22  Yes Marcine Matar, MD  Study - [employer] - olezarsen 50 mg, 80 mg or placebo SQ injection (PI-Hilty) Inject 80 mg into the skin every 28 (twenty-eight) days. For Investigational Use Only. Injection subcutaneously in protocol approved injection sites (abdomen, thigh or outer area of upper arm) every 4 weeks in clinic. Please contact  [facility] for any questions or concerns regarding this  medication.   Yes Hilty, Lisette Abu, MD  VASCEPA 1 g capsule TAKE 2 CAPSULES TWICE DAILY 07/03/22  Yes Hilty, Lisette Abu, MD  ARIPiprazole (ABILIFY) 2 MG tablet Take 2 mg by mouth daily. Patient not taking: Reported on 02/20/2023 11/08/20   [provider]  fexofenadine (ALLEGRA) 180 MG tablet Take 1 tablet (180 mg total) by mouth daily. Patient not taking: Reported on 02/20/2023 10/19/21   Marcelyn Bruins, MD  Multiple Vitamins-Minerals (MULTIVITAMIN WITH MINERALS) tablet Take 1 tablet by mouth daily. Patient not taking: Reported on 02/20/2023    [provider]  nystatin cream (MYCOSTATIN) Apply 1 application topically 2 (two) times daily. Patient not taking: Reported on 02/20/2023 08/07/21   Marcine Matar, MD  Olopatadine HCl (PATADAY) 0.2 % SOLN Place 1 drop into both eyes daily as needed (allergies). Patient not taking: Reported on 02/20/2023 03/14/22   Anders Simmonds, PA-C  omeprazole (PRILOSEC) 40 MG capsule Take 1 capsule (40 mg total) by mouth daily. Patient not taking: Reported on 02/20/2023 02/12/22   B[place]ley Fiedler, MD  traMADol-acetaminophen Alphonzo Severance) 37.5-325 MG tablet  04/12/22   [provider]  traZODone (DESYREL) 100 MG tablet Take 1 tablet (100 mg total) by mouth at bedtime as needed for sleep. Patient not taking: Reported on 02/20/2023 06/14/20   Melony Overly T, PA-C    ROS: Neg HEENT Neg resp Neg cardiac Neg GI Neg GU Neg MS Neg psych Neg neuro  Objective:   Vitals:   02/20/23 1417  BP: 115/81  Pulse: (!) 111  SpO2: 98%  Weight: 138 lb (62.6 kg)   Exam General appearance : Awake, alert, not in any distress. Speech Clear. Not toxic looking HEENT: Atraumatic and Normocephalic Neck: Supple, no JVD. No cervical lymphadenopathy.  Chest: Good air entry bilaterally, CTAB.  No rales/rhonchi/wheezing CVS: S1 S2 regular, no murmurs.   Extremities: B/L Lower Ext shows no edema, both legs are warm to touch Neurology: Awake alert, and  oriented X 3, CN II-XII intact, Non focal Skin: No Rash; no jaundice  Data Review Lab Results  Component Value Date   HGBA1C 10.7 (A) 12/30/2022   HGBA1C 9.6 (A) 06/18/2022   HGBA1C 8.3 (A) 03/13/2022    Assessment & Plan   1. Uncontrolled diabetes mellitus with hyperglycemia, with long-term current use of insulin (HCC) Continue current regimen and I have had a lengthy discussion and provided education about insulin resistance and the intake of too much sugar/refined carbohydrates.  I have advised the patient to work at a goal of eliminating sugary drinks, candy, desserts, sweets, refined sugars, processed foods, and white carbohydrates.  The patient expresses understanding.  Also increase exercise - Glucose (CBG)  2. Abnormal LFTs work at a goal of eliminating sugary drinks, candy, desserts, sweets,  refined sugars, processed foods, and white carbohydrates.  The patient expresses understanding. Avoid tylenol and drinking alcohol Also increase exercise - Ambulatory referral to Gastroenterology  3. Fatty liver work at a goal of eliminating sugary drinks, candy, desserts, sweets, refined sugars, processed foods, fatty foods, and white carbohydrates.  The patient expresses understanding.  Also increase exercise - Ambulatory referral to Gastroenterology  4. Elevated alkaline phosphatase level - Ambulatory referral to Gastroenterology    Return for october appt with Dr Laural Benes.  The patient was given clear instructions to go to ER or return to medical center if symptoms don't improve, worsen or new problems develop. The patient verbalized understanding. The patient was told to call to get lab results if they haven't heard anything in the next week.      Georgian Co, PA-C [facility] [name], [place] [phone]   02/20/2023, 2:49 PM

## 2023-02-24 ENCOUNTER — Other Ambulatory Visit: Payer: Self-pay | Admitting: Internal Medicine

## 2023-02-24 DIAGNOSIS — F411 Generalized anxiety disorder: Secondary | ICD-10-CM | POA: Diagnosis not present

## 2023-02-24 DIAGNOSIS — F331 Major depressive disorder, recurrent, moderate: Secondary | ICD-10-CM | POA: Diagnosis not present

## 2023-02-24 DIAGNOSIS — I152 Hypertension secondary to endocrine disorders: Secondary | ICD-10-CM

## 2023-02-24 DIAGNOSIS — F900 Attention-deficit hyperactivity disorder, predominantly inattentive type: Secondary | ICD-10-CM | POA: Diagnosis not present

## 2023-02-24 DIAGNOSIS — Z0100 Encounter for examination of eyes and vision without abnormal findings: Secondary | ICD-10-CM | POA: Diagnosis not present

## 2023-02-26 ENCOUNTER — Ambulatory Visit: Payer: Medicare HMO | Admitting: Registered"

## 2023-03-03 DIAGNOSIS — F411 Generalized anxiety disorder: Secondary | ICD-10-CM | POA: Diagnosis not present

## 2023-03-03 DIAGNOSIS — F331 Major depressive disorder, recurrent, moderate: Secondary | ICD-10-CM | POA: Diagnosis not present

## 2023-03-03 DIAGNOSIS — F900 Attention-deficit hyperactivity disorder, predominantly inattentive type: Secondary | ICD-10-CM | POA: Diagnosis not present

## 2023-03-05 DIAGNOSIS — R0602 Shortness of breath: Secondary | ICD-10-CM | POA: Diagnosis not present

## 2023-03-05 DIAGNOSIS — M25511 Pain in right shoulder: Secondary | ICD-10-CM | POA: Diagnosis not present

## 2023-03-05 DIAGNOSIS — M542 Cervicalgia: Secondary | ICD-10-CM | POA: Diagnosis not present

## 2023-03-05 DIAGNOSIS — D539 Nutritional anemia, unspecified: Secondary | ICD-10-CM | POA: Diagnosis not present

## 2023-03-05 DIAGNOSIS — M545 Low back pain, unspecified: Secondary | ICD-10-CM | POA: Diagnosis not present

## 2023-03-05 DIAGNOSIS — Z131 Encounter for screening for diabetes mellitus: Secondary | ICD-10-CM | POA: Diagnosis not present

## 2023-03-05 DIAGNOSIS — M129 Arthropathy, unspecified: Secondary | ICD-10-CM | POA: Diagnosis not present

## 2023-03-05 DIAGNOSIS — Z79891 Long term (current) use of opiate analgesic: Secondary | ICD-10-CM | POA: Diagnosis not present

## 2023-03-05 DIAGNOSIS — R5383 Other fatigue: Secondary | ICD-10-CM | POA: Diagnosis not present

## 2023-03-05 DIAGNOSIS — Z1159 Encounter for screening for other viral diseases: Secondary | ICD-10-CM | POA: Diagnosis not present

## 2023-03-05 DIAGNOSIS — E7800 Pure hypercholesterolemia, unspecified: Secondary | ICD-10-CM | POA: Diagnosis not present

## 2023-03-05 DIAGNOSIS — E559 Vitamin D deficiency, unspecified: Secondary | ICD-10-CM | POA: Diagnosis not present

## 2023-03-05 DIAGNOSIS — Z0000 Encounter for general adult medical examination without abnormal findings: Secondary | ICD-10-CM | POA: Diagnosis not present

## 2023-03-07 DIAGNOSIS — Z79899 Other long term (current) drug therapy: Secondary | ICD-10-CM | POA: Diagnosis not present

## 2023-03-11 DIAGNOSIS — F900 Attention-deficit hyperactivity disorder, predominantly inattentive type: Secondary | ICD-10-CM | POA: Diagnosis not present

## 2023-03-11 DIAGNOSIS — F411 Generalized anxiety disorder: Secondary | ICD-10-CM | POA: Diagnosis not present

## 2023-03-11 DIAGNOSIS — F331 Major depressive disorder, recurrent, moderate: Secondary | ICD-10-CM | POA: Diagnosis not present

## 2023-03-13 ENCOUNTER — Encounter: Payer: Self-pay | Admitting: Dietician

## 2023-03-13 ENCOUNTER — Encounter: Payer: Medicare HMO | Attending: Internal Medicine | Admitting: Dietician

## 2023-03-13 VITALS — Ht 59.0 in | Wt 138.0 lb

## 2023-03-13 DIAGNOSIS — E119 Type 2 diabetes mellitus without complications: Secondary | ICD-10-CM | POA: Diagnosis not present

## 2023-03-13 NOTE — Patient Instructions (Signed)
Goal: aim to include non-starchy vegetables with dinner. (At meals, aim to include 1/2 plate non-starchy vegetables, 1/4 plate protein, and 1/4 plate complex carbs.)  Goal: add a protein source to breakfast. (examples: nuts, eggs, protein shake, greek yogurt, cheese).   When snacking, aim to include a complex carb and protein.

## 2023-03-13 NOTE — Progress Notes (Signed)
Diabetes Self-Management Education  Visit Type: First/Initial  Appt. Start Time: 1415 Appt. End Time: 1500  03/13/2023  Amy Glass, identified by name and date of birth, is a 62 y.o. female with a diagnosis of Diabetes: Type 2.   ASSESSMENT  History includes: allergies, anxiety, depression, type 2 diabetes, GERD, HLD, HTN, nerve disease Labs noted: 12/30/22: A1c 10.7% Medications include: semaglutide, metformin Supplements: MVI, magnesium  Pt states she used to take insulin daily but switched to semaglutide a month ago.   Pt states she had her last child in 2004 and had gestational diabetes and a few years after that she was diagnosed with type 2 diabetes.   Pt states she struggles with depression but she goes to counseling and feels that it helps.   Pt states she likes to go walking 3 times per week for 45 minutes.  Pt states she went through some depression and stopped taking her medications and was eating more when her A1c was 10.7. She states she is trying to get back on track now.  Pt states diabetes causes her stress and sometimes she feels like she would rather just not eat.   Height 4\' 11"  (1.499 m), weight 138 lb (62.6 kg), last menstrual period 05/30/2015. Body mass index is 27.87 kg/m.   Diabetes Self-Management Education - 03/13/23 1417       Visit Information   Visit Type First/Initial      Initial Visit   Diabetes Type Type 2    Date Diagnosed 2007 (suspects)    Are you currently following a meal plan? No    Are you taking your medications as prescribed? Yes      Health Coping   How would you rate your overall health? Good      Psychosocial Assessment   Patient Belief/Attitude about Diabetes Motivated to manage diabetes    What is the hardest part about your diabetes right now, causing you the most concern, or is the most worrisome to you about your diabetes?   Making healty food and beverage choices    Self-care barriers None     Self-management support Doctor's office    Other persons present Patient    Patient Concerns Nutrition/Meal planning    Special Needs None    Preferred Learning Style No preference indicated    Learning Readiness Ready    How often do you need to have someone help you when you read instructions, pamphlets, or other written materials from your doctor or pharmacy? 1 - Never    What is the last grade level you completed in school? 11      Pre-Education Assessment   Patient understands the diabetes disease and treatment process. Needs Instruction    Patient understands incorporating nutritional management into lifestyle. Needs Instruction    Patient undertands incorporating physical activity into lifestyle. Needs Instruction    Patient understands using medications safely. Needs Instruction    Patient understands monitoring blood glucose, interpreting and using results Needs Instruction    Patient understands prevention, detection, and treatment of acute complications. Needs Instruction    Patient understands prevention, detection, and treatment of chronic complications. Needs Instruction    Patient understands how to develop strategies to address psychosocial issues. Needs Instruction    Patient understands how to develop strategies to promote health/change behavior. Needs Instruction      Complications   Last HgB A1C per patient/outside source 10.7 %    How often do you check your blood sugar? 1-2  times/day    Fasting Blood glucose range (mg/dL) 16-109    Have you had a dilated eye exam in the past 12 months? Yes    Have you had a dental exam in the past 12 months? Yes    Are you checking your feet? Yes    How many days per week are you checking your feet? 7      Dietary Intake   Breakfast donut and coffee    Snack (morning) none    Lunch deli chicken sandwich on pumpernickle with mustard and mayo and nectarines    Snack (afternoon) none    Dinner 2 nectarines    Snack (evening) none     Beverage(s) 3-4 bottles water, coffee with cream and splenda      Activity / Exercise   Activity / Exercise Type Light (walking / raking leaves)    How many days per week do you exercise? 3    How many minutes per day do you exercise? 45    Total minutes per week of exercise 135      Patient Education   Previous Diabetes Education No    Disease Pathophysiology Explored patient's options for treatment of their diabetes;Factors that contribute to the development of diabetes    Healthy Eating Role of diet in the treatment of diabetes and the relationship between the three main macronutrients and blood glucose level;Plate Method;Reviewed blood glucose goals for pre and post meals and how to evaluate the patients' food intake on their blood glucose level.;Meal timing in regards to the patients' current diabetes medication.;Meal options for control of blood glucose level and chronic complications.    Being Active Helped patient identify appropriate exercises in relation to his/her diabetes, diabetes complications and other health issue.    Medications Reviewed patients medication for diabetes, action, purpose, timing of dose and side effects.    Monitoring Identified appropriate SMBG and/or A1C goals.;Daily foot exams;Yearly dilated eye exam    Chronic complications Relationship between chronic complications and blood glucose control;Assessed and discussed foot care and prevention of foot problems;Dental care;Identified and discussed with patient  current chronic complications;Nephropathy, what it is, prevention of, the use of ACE, ARB's and early detection of through urine microalbumia.    Diabetes Stress and Support Identified and addressed patients feelings and concerns about diabetes;Worked with patient to identify barriers to care and solutions;Role of stress on diabetes    Lifestyle and Health Coping Lifestyle issues that need to be addressed for better diabetes care      Individualized Goals  (developed by patient)   Nutrition General guidelines for healthy choices and portions discussed    Physical Activity Exercise 3-5 times per week;45 minutes per day    Medications take my medication as prescribed    Monitoring  Test my blood glucose as discussed    Problem Solving Eating Pattern    Reducing Risk examine blood glucose patterns;do foot checks daily;treat hypoglycemia with 15 grams of carbs if blood glucose less than 70mg /dL    Health Coping Ask for help with psychological, social, or emotional issues      Post-Education Assessment   Patient understands the diabetes disease and treatment process. Comprehends key points    Patient understands incorporating nutritional management into lifestyle. Comprehends key points    Patient undertands incorporating physical activity into lifestyle. Comprehends key points    Patient understands using medications safely. Comphrehends key points    Patient understands monitoring blood glucose, interpreting and using results Comprehends key  points    Patient understands prevention, detection, and treatment of acute complications. Comprehends key points    Patient understands prevention, detection, and treatment of chronic complications. Comprehends key points    Patient understands how to develop strategies to address psychosocial issues. Comprehends key points    Patient understands how to develop strategies to promote health/change behavior. Comprehends key points      Outcomes   Expected Outcomes Demonstrated interest in learning. Expect positive outcomes    Future DMSE 3-4 months    Program Status Not Completed             Individualized Plan for Diabetes Self-Management Training:   Learning Objective:  Patient will have a greater understanding of diabetes self-management. Patient education plan is to attend individual and/or group sessions per assessed needs and concerns.   Plan:   Patient Instructions  Goal: aim to include  non-starchy vegetables with dinner. (At meals, aim to include 1/2 plate non-starchy vegetables, 1/4 plate protein, and 1/4 plate complex carbs.)  Goal: add a protein source to breakfast. (examples: nuts, eggs, protein shake, greek yogurt, cheese).   When snacking, aim to include a complex carb and protein.  Expected Outcomes:  Demonstrated interest in learning. Expect positive outcomes  Education material provided: ADA - How to Thrive: A Guide for Your Journey with Diabetes and Meal plan card, non-starchy vegetable list  If problems or questions, patient to contact team via:  Phone  Future DSME appointment: 3-4 months

## 2023-03-14 DIAGNOSIS — H6123 Impacted cerumen, bilateral: Secondary | ICD-10-CM | POA: Diagnosis not present

## 2023-03-19 ENCOUNTER — Other Ambulatory Visit: Payer: Self-pay | Admitting: Internal Medicine

## 2023-03-19 DIAGNOSIS — M5440 Lumbago with sciatica, unspecified side: Secondary | ICD-10-CM | POA: Diagnosis not present

## 2023-03-19 DIAGNOSIS — Z79891 Long term (current) use of opiate analgesic: Secondary | ICD-10-CM | POA: Diagnosis not present

## 2023-03-19 DIAGNOSIS — E1165 Type 2 diabetes mellitus with hyperglycemia: Secondary | ICD-10-CM

## 2023-03-19 DIAGNOSIS — R0602 Shortness of breath: Secondary | ICD-10-CM | POA: Diagnosis not present

## 2023-03-19 DIAGNOSIS — Z6827 Body mass index (BMI) 27.0-27.9, adult: Secondary | ICD-10-CM | POA: Diagnosis not present

## 2023-03-19 DIAGNOSIS — E559 Vitamin D deficiency, unspecified: Secondary | ICD-10-CM | POA: Diagnosis not present

## 2023-03-19 DIAGNOSIS — G8929 Other chronic pain: Secondary | ICD-10-CM | POA: Diagnosis not present

## 2023-03-19 DIAGNOSIS — E7800 Pure hypercholesterolemia, unspecified: Secondary | ICD-10-CM | POA: Diagnosis not present

## 2023-03-19 DIAGNOSIS — F339 Major depressive disorder, recurrent, unspecified: Secondary | ICD-10-CM | POA: Diagnosis not present

## 2023-03-19 DIAGNOSIS — E119 Type 2 diabetes mellitus without complications: Secondary | ICD-10-CM | POA: Diagnosis not present

## 2023-03-20 DIAGNOSIS — F411 Generalized anxiety disorder: Secondary | ICD-10-CM | POA: Diagnosis not present

## 2023-03-20 DIAGNOSIS — F331 Major depressive disorder, recurrent, moderate: Secondary | ICD-10-CM | POA: Diagnosis not present

## 2023-03-20 DIAGNOSIS — F900 Attention-deficit hyperactivity disorder, predominantly inattentive type: Secondary | ICD-10-CM | POA: Diagnosis not present

## 2023-03-21 ENCOUNTER — Encounter: Payer: Medicare HMO | Admitting: *Deleted

## 2023-03-21 VITALS — BP 134/88 | HR 78 | Temp 97.2°F | Resp 20 | Wt 138.0 lb

## 2023-03-21 DIAGNOSIS — Z006 Encounter for examination for normal comparison and control in clinical research program: Secondary | ICD-10-CM

## 2023-03-21 NOTE — Research (Signed)
EOS visit for CS9   Patient here today feeling better - just got over covid Tested + on Sunday no fever Physical exam completed by Dr Riley Kill  EKG completed   Vitals:   03/21/23 0946  BP: 134/88  Pulse: 78  Temp: (!) 97.2 F (36.2 C)  Resp: 20  Weight: 138 lb (62.6 kg)  SpO2: 99%  BMI (Calculated): 27.86   Meds reviewed and updated   Patient reports no visits to hosptial or urgent care or abdominal pain.   Blood work collected at [phone] and urine sample collected at 1010  Per Dr Riley Kill will request follow up with Dr Rennis Golden since this is her last visit.  Thanked the patient for participating in the study.    Current Outpatient Medications:    Accu-Chek Softclix Lancets lancets, Use to check blood sugar TID. E11.65, Disp: 100 each, Rfl: 3   amLODipine (NORVASC) 10 MG tablet, TAKE 1 TABLET EVERY DAY, Disp: 90 tablet, Rfl: 0   amphetamine-dextroamphetamine (ADDERALL) 20 MG tablet, Take 20 mg by mouth 2 (two) times daily., Disp: , Rfl:    aspirin EC 81 MG tablet, Take 81 mg by mouth daily. Swallow whole., Disp: , Rfl:    benzonatate (TESSALON) 100 MG capsule, Take 1 capsule (100 mg total) by mouth 2 (two) times daily as needed for cough., Disp: 20 capsule, Rfl: 0   Blood Glucose Monitoring Suppl (ACCU-CHEK GUIDE) w/Device KIT, Use to check blood sugar TID. E11.65, Disp: 1 kit, Rfl: 0   Continuous Blood Gluc Receiver (FREESTYLE LIBRE 3 READER) DEVI, 1 each by Does not apply route daily., Disp: 1 each, Rfl: 0   Continuous Blood Gluc Sensor (FREESTYLE LIBRE 3 SENSOR) MISC, Change Q 2 weeks, Disp: 2 each, Rfl: 6   desvenlafaxine (PRISTIQ) 50 MG 24 hr tablet, Take 50 mg by mouth daily., Disp: , Rfl:    glucose blood (ACCU-CHEK GUIDE) test strip, USE AS DIRECTED TO TEST BLOOD SUGAR ONCE DAILY, Disp: 100 strip, Rfl: 1   HYDROcodone-acetaminophen (NORCO) 10-325 MG tablet, Take 1 tablet by mouth 2 (two) times daily as needed., Disp: , Rfl:    hydrOXYzine (ATARAX) 25 MG tablet, Take 25 mg by  mouth daily., Disp: , Rfl:    losartan (COZAAR) 100 MG tablet, TAKE 1 TABLET EVERY DAY (NEED MD APPOINTMENT), Disp: 30 tablet, Rfl: 2   metFORMIN (GLUCOPHAGE-XR) 500 MG 24 hr tablet, 2 tabs PO twice a day.  Stop Janumet, Disp: 360 tablet, Rfl: 1   Semaglutide,0.25 or 0.5MG /DOS, (OZEMPIC, 0.25 OR 0.5 MG/DOSE,) 2 MG/3ML SOPN, INJECT 0.25MG  UNDER THE SKIN ONE TIME WEEKLY, Disp: 3 mL, Rfl: 3   Study - ESSENCE - olezarsen 50 mg, 80 mg or placebo SQ injection (PI-Hilty), Inject 80 mg into the skin every 28 (twenty-eight) days. For Investigational Use Only. Injection subcutaneously in protocol approved injection sites (abdomen, thigh or outer area of upper arm) every 4 weeks in clinic. Please contact  [facility] for any questions or concerns regarding this medication., Disp: , Rfl:    VASCEPA 1 g capsule, TAKE 2 CAPSULES TWICE DAILY, Disp: 360 capsule, Rfl: 3   VRAYLAR 3 MG capsule, Take 3 mg by mouth daily., Disp: , Rfl:    ARIPiprazole (ABILIFY) 2 MG tablet, Take 2 mg by mouth daily. (Patient not taking: Reported on 02/20/2023), Disp: , Rfl:    atorvastatin (LIPITOR) 20 MG tablet, TAKE 1 TABLET EVERY DAY (Patient not taking: Reported on 03/21/2023), Disp: 90 tablet, Rfl: 3  fexofenadine (ALLEGRA) 180 MG tablet, Take 1 tablet (180 mg total) by mouth daily. (Patient not taking: Reported on 02/20/2023), Disp: 90 tablet, Rfl: 1   insulin glargine (LANTUS SOLOSTAR) 100 UNIT/ML Solostar Pen, Inject 22 Units into the skin daily. Discontinue Evaristo Bury (Patient not taking: Reported on 03/21/2023), Disp: 15 mL, Rfl: 11   Insulin Pen Needle (PEN NEEDLES) 32G X 4 MM MISC, Use to inject insulin once daily. (Patient not taking: Reported on 03/21/2023), Disp: 100 each, Rfl: 1   Magnesium 400 MG CAPS, Take 400 mg by mouth daily. (Patient not taking: Reported on 03/21/2023), Disp: 30 capsule, Rfl: 1   Multiple Vitamins-Minerals (MULTIVITAMIN WITH MINERALS) tablet, Take 1 tablet by mouth daily.  (Patient not taking: Reported on 03/21/2023), Disp: , Rfl:    nystatin cream (MYCOSTATIN), Apply 1 application topically 2 (two) times daily. (Patient not taking: Reported on 02/20/2023), Disp: 30 g, Rfl: 2   Olopatadine HCl (PATADAY) 0.2 % SOLN, Place 1 drop into both eyes daily as needed (allergies). (Patient not taking: Reported on 02/20/2023), Disp: 2.5 mL, Rfl: 5   omeprazole (PRILOSEC) 40 MG capsule, Take 1 capsule (40 mg total) by mouth daily. (Patient not taking: Reported on 02/20/2023), Disp: 90 capsule, Rfl: 3   traMADol-acetaminophen (ULTRACET) 37.5-325 MG tablet, , Disp: , Rfl:    traZODone (DESYREL) 100 MG tablet, Take 1 tablet (100 mg total) by mouth at bedtime as needed for sleep. (Patient not taking: Reported on 02/20/2023), Disp: 90 tablet, Rfl: 2  Current Facility-Administered Medications:    Study - ESSENCE - olezarsen 50 mg, 80 mg or placebo SQ injection (PI-Hilty), 80 mg, Subcutaneous, Q28 days, Chrystie Nose, MD, 80 mg at 11/20/22 [phone]

## 2023-03-21 NOTE — Progress Notes (Signed)
Patient seen today as follow up.  Has not been on ESSENCE drug now and here for labs.  She developed COVID and is just getting over it without significant symptoms at present.  Masks worn today.   Vitals: BP 134/88 (BP Location: Right Arm, Patient Position: Sitting, Cuff Size: Normal)   Pulse 78   Temp 97.2 F (36.2 C) Important    Resp 20   Wt 138 lb (62.6 kg)   LMP 05/30/2015   SpO2 99%   BMI 27.87 kg/m   BSA 1.61 m   Alerted, oriented female in NAD Lungs clear to AandP Cor reg Abd soft Ext no edema  Last ESSENCE visit.    Should see Dr. Rennis Golden back again for follow up now that she is completing the trial.  Discussed with patient.    Arturo Morton. Riley Kill, MD Medical Director, [facility]

## 2023-03-25 DIAGNOSIS — Z79899 Other long term (current) drug therapy: Secondary | ICD-10-CM | POA: Diagnosis not present

## 2023-03-25 NOTE — Research (Addendum)
Nichelle Quimby Essence day 66 post follow up  21-Mar-2023  Pt reported she did an at home covid test, because she was feeling tired.                     Chemistry: Glucose 161   mg/dL                                    [] Clinically Significant  [x] Not Clinically Significant AST/SGOT 50 U/L                                        [] Clinically Significant  [x] Not Clinically Significant Gamma Glutamyl Transfers (GGT)129 U/L  [] Clinically Significant  [x] Not Clinically Significant Hs-C-Reactive Protein 15.3 mg/L                 [] Clinically Significant  [x] Not Clinically Significant Hemoglobin A1c 8.7                                     [] Clinically Significant  [x] Not Clinically Significant    Urinalysis: Protein 20 mg/dL                                         [] Clinically Significant  [x] Not Clinically Significant Leukocyte Esterase 250                              [] Clinically Significant  [x] Not Clinically Significant Urinary Red Blood Cells 16-29                    [] Clinically Significant  [x] Not Clinically Significant Squamous Epithelial Cells 3-5                     [] Clinically Significant  [x] Not Clinically Significant Mucus 2+                                                     [] Clinically Significant  [x] Not Clinically Significant  Urine Chemistry  Urine Albumin 3.67 mg/dL                            [] Clinically Significant  [x] Not Clinically Significant Albumin Creatinine Ratio 31 mg/g                [] Clinically Significant  [x] Not Clinically Significant     Any further action needed to be taken per the PI? No  Chrystie Nose, MD, [facility], FACP  [facility]  Medical Director of the [facility]  [facility] Diplomate of the American Board of Clinical Lipidology Attending Cardiologist  Direct Dial: [phone]  Fax: [phone]  Website:  [email or web address]

## 2023-03-26 DIAGNOSIS — F411 Generalized anxiety disorder: Secondary | ICD-10-CM | POA: Diagnosis not present

## 2023-03-26 DIAGNOSIS — F331 Major depressive disorder, recurrent, moderate: Secondary | ICD-10-CM | POA: Diagnosis not present

## 2023-03-26 DIAGNOSIS — F900 Attention-deficit hyperactivity disorder, predominantly inattentive type: Secondary | ICD-10-CM | POA: Diagnosis not present

## 2023-03-27 DIAGNOSIS — F331 Major depressive disorder, recurrent, moderate: Secondary | ICD-10-CM | POA: Diagnosis not present

## 2023-03-27 DIAGNOSIS — F900 Attention-deficit hyperactivity disorder, predominantly inattentive type: Secondary | ICD-10-CM | POA: Diagnosis not present

## 2023-03-27 DIAGNOSIS — F411 Generalized anxiety disorder: Secondary | ICD-10-CM | POA: Diagnosis not present

## 2023-03-31 DIAGNOSIS — F331 Major depressive disorder, recurrent, moderate: Secondary | ICD-10-CM | POA: Diagnosis not present

## 2023-03-31 DIAGNOSIS — F900 Attention-deficit hyperactivity disorder, predominantly inattentive type: Secondary | ICD-10-CM | POA: Diagnosis not present

## 2023-03-31 DIAGNOSIS — F411 Generalized anxiety disorder: Secondary | ICD-10-CM | POA: Diagnosis not present

## 2023-04-03 DIAGNOSIS — E119 Type 2 diabetes mellitus without complications: Secondary | ICD-10-CM | POA: Diagnosis not present

## 2023-04-03 DIAGNOSIS — Z79899 Other long term (current) drug therapy: Secondary | ICD-10-CM | POA: Diagnosis not present

## 2023-04-03 DIAGNOSIS — E7800 Pure hypercholesterolemia, unspecified: Secondary | ICD-10-CM | POA: Diagnosis not present

## 2023-04-03 DIAGNOSIS — E559 Vitamin D deficiency, unspecified: Secondary | ICD-10-CM | POA: Diagnosis not present

## 2023-04-03 DIAGNOSIS — F339 Major depressive disorder, recurrent, unspecified: Secondary | ICD-10-CM | POA: Diagnosis not present

## 2023-04-03 DIAGNOSIS — M5440 Lumbago with sciatica, unspecified side: Secondary | ICD-10-CM | POA: Diagnosis not present

## 2023-04-03 DIAGNOSIS — G8929 Other chronic pain: Secondary | ICD-10-CM | POA: Diagnosis not present

## 2023-04-03 DIAGNOSIS — Z6828 Body mass index (BMI) 28.0-28.9, adult: Secondary | ICD-10-CM | POA: Diagnosis not present

## 2023-04-08 ENCOUNTER — Ambulatory Visit: Payer: Medicare HMO | Attending: Internal Medicine

## 2023-04-08 VITALS — Ht 59.0 in | Wt 138.0 lb

## 2023-04-08 DIAGNOSIS — Z0000 Encounter for general adult medical examination without abnormal findings: Secondary | ICD-10-CM | POA: Diagnosis not present

## 2023-04-08 DIAGNOSIS — Z1231 Encounter for screening mammogram for malignant neoplasm of breast: Secondary | ICD-10-CM

## 2023-04-08 NOTE — Patient Instructions (Addendum)
Ms. Lagrange , Thank you for taking time to come for your Medicare Wellness Visit. I appreciate your ongoing commitment to your health goals. Please review the following plan we discussed and let me know if I can assist you in the future.   Referrals/Orders/Follow-Ups/Clinician Recommendations: Aim for 30 minutes of exercise or brisk walking, 6-8 glasses of water, and 5 servings of fruits and vegetables each day.  You have an order for:  []   2D Mammogram  [x]   3D Mammogram  []   Bone Density     Please call for appointment:   [facility] [address] [place], [place] [address] [phone]  Make sure to wear two-piece clothing.  No lotions, powders, or deodorants the day of the appointment. Make sure to bring picture ID and insurance card.  Bring list of medications you are currently taking including any supplements.   Schedule your [place] screening mammogram through MyChart!   Log into your MyChart account.  Go to 'Visit' (or 'Appointments' if on mobile App) --> Schedule an Appointment  Under 'Select a Reason for Visit' choose the Mammogram Screening option.  Complete the pre-visit questions and select the time and place that best fits your schedule.    This is a list of the screening recommended for you and due dates:  Health Maintenance  Topic Date Due   Flu Shot  02/20/2023   Mammogram  02/24/2023   COVID-19 Vaccine (5 - 2023-24 season) 03/23/2023   Hemoglobin A1C  07/01/2023   Yearly kidney health urinalysis for diabetes  08/23/2023   Complete foot exam   08/23/2023   Eye exam for diabetics  10/03/2023   Yearly kidney function blood test for diabetes  10/16/2023   Medicare Annual Wellness Visit  04/07/2024   Pap with HPV screening  07/28/2025   DTaP/Tdap/Td vaccine (2 - Td or Tdap) 12/27/2026   Colon Cancer Screening  05/30/2028   Hepatitis C Screening  Completed   HIV Screening  Completed   Zoster (Shingles) Vaccine  Completed    HPV Vaccine  Aged Out    Advanced directives: (In Chart) A copy of your advanced directives are scanned into your chart should your provider ever need it.  Next Medicare Annual Wellness Visit scheduled for next year: Yes
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
## 2023-04-14 ENCOUNTER — Other Ambulatory Visit: Payer: Self-pay | Admitting: Internal Medicine

## 2023-04-14 DIAGNOSIS — I10 Essential (primary) hypertension: Secondary | ICD-10-CM

## 2023-04-15 NOTE — Telephone Encounter (Signed)
Requested Prescriptions  Pending Prescriptions Disp Refills   losartan (COZAAR) 100 MG tablet [Pharmacy Med Name: Losartan Potassium Oral Tablet 100 MG] 90 tablet 0    Sig: TAKE 1 TABLET EVERY DAY (NEED MD APPOINTMENT)     Cardiovascular:  Angiotensin Receptor Blockers Failed - 04/14/2023  2:45 AM      Failed - Cr in normal range and within 180 days    Creat  Date Value Ref Range Status  12/21/2015 0.56 0.50 - 1.05 mg/dL Final   Creatinine, Ser  Date Value Ref Range Status  05/28/2022 0.56 0.44 - 1.00 mg/dL Final         Failed - K in normal range and within 180 days    Potassium  Date Value Ref Range Status  05/28/2022 4.5 3.5 - 5.1 mmol/L Final         Passed - Patient is not pregnant      Passed - Last BP in normal range    BP Readings from Last 1 Encounters:  03/21/23 134/88         Passed - Valid encounter within last 6 months    Recent Outpatient Visits           1 month ago Uncontrolled diabetes mellitus with hyperglycemia, with long-term current use of insulin [facility])   [place] [facility] [place], [place]   3 months ago Diabetes mellitus without complication [facility])   [place] [facility] [place], [place] L, RPH-CPP   3 months ago Uncontrolled diabetes mellitus with hyperglycemia, with long-term current use of insulin [facility])   [place] [facility] Jonah Blue B, MD   7 months ago Uncontrolled diabetes mellitus with hyperglycemia, with long-term current use of insulin [facility])   [place] [facility] Jonah Blue B, MD   10 months ago Uncontrolled type 2 diabetes mellitus with hyperglycemia, without long-term current use of insulin [facility])   [place] [facility] Marcine Matar, MD       Future Appointments             In 1 week Marcine Matar, MD [facility]

## 2023-04-21 DIAGNOSIS — E7800 Pure hypercholesterolemia, unspecified: Secondary | ICD-10-CM | POA: Diagnosis not present

## 2023-04-21 DIAGNOSIS — G8929 Other chronic pain: Secondary | ICD-10-CM | POA: Diagnosis not present

## 2023-04-21 DIAGNOSIS — Z6828 Body mass index (BMI) 28.0-28.9, adult: Secondary | ICD-10-CM | POA: Diagnosis not present

## 2023-04-21 DIAGNOSIS — E559 Vitamin D deficiency, unspecified: Secondary | ICD-10-CM | POA: Diagnosis not present

## 2023-04-21 DIAGNOSIS — F339 Major depressive disorder, recurrent, unspecified: Secondary | ICD-10-CM | POA: Diagnosis not present

## 2023-04-21 DIAGNOSIS — Z79899 Other long term (current) drug therapy: Secondary | ICD-10-CM | POA: Diagnosis not present

## 2023-04-21 DIAGNOSIS — E119 Type 2 diabetes mellitus without complications: Secondary | ICD-10-CM | POA: Diagnosis not present

## 2023-04-21 DIAGNOSIS — M5440 Lumbago with sciatica, unspecified side: Secondary | ICD-10-CM | POA: Diagnosis not present

## 2023-04-23 DIAGNOSIS — Z79899 Other long term (current) drug therapy: Secondary | ICD-10-CM | POA: Diagnosis not present

## 2023-04-24 DIAGNOSIS — F900 Attention-deficit hyperactivity disorder, predominantly inattentive type: Secondary | ICD-10-CM | POA: Diagnosis not present

## 2023-04-24 DIAGNOSIS — F411 Generalized anxiety disorder: Secondary | ICD-10-CM | POA: Diagnosis not present

## 2023-04-24 DIAGNOSIS — F331 Major depressive disorder, recurrent, moderate: Secondary | ICD-10-CM | POA: Diagnosis not present

## 2023-04-25 ENCOUNTER — Ambulatory Visit: Payer: Medicare HMO | Admitting: Internal Medicine

## 2023-05-03 ENCOUNTER — Other Ambulatory Visit: Payer: Self-pay | Admitting: Internal Medicine

## 2023-05-03 DIAGNOSIS — E1159 Type 2 diabetes mellitus with other circulatory complications: Secondary | ICD-10-CM

## 2023-05-05 ENCOUNTER — Other Ambulatory Visit: Payer: Self-pay | Admitting: *Deleted

## 2023-05-05 NOTE — Telephone Encounter (Signed)
Requested Prescriptions  Pending Prescriptions Disp Refills   amLODipine (NORVASC) 10 MG tablet [Pharmacy Med Name: amLODIPine Besylate Oral Tablet 10 MG] 90 tablet 0    Sig: TAKE 1 TABLET EVERY DAY     Cardiovascular: Calcium Channel Blockers 2 Passed - 05/03/2023  3:30 AM      Passed - Last BP in normal range    BP Readings from Last 1 Encounters:  03/21/23 134/88         Passed - Last Heart Rate in normal range    Pulse Readings from Last 1 Encounters:  03/21/23 78         Passed - Valid encounter within last 6 months    Recent Outpatient Visits           2 months ago Uncontrolled diabetes mellitus with hyperglycemia, with long-term current use of insulin [facility])   [place] [facility] [place], [place], [place]   4 months ago Diabetes mellitus without complication [facility])   [place] [facility] [place], [place] L, RPH-CPP   4 months ago Uncontrolled diabetes mellitus with hyperglycemia, with long-term current use of insulin [facility])   [place] [facility] Jonah Blue B, MD   8 months ago Uncontrolled diabetes mellitus with hyperglycemia, with long-term current use of insulin [facility])   [place] [facility] Jonah Blue B, MD   10 months ago Uncontrolled type 2 diabetes mellitus with hyperglycemia, without long-term current use of insulin [facility])   [place] [facility] Marcine Matar, MD

## 2023-05-06 DIAGNOSIS — K7581 Nonalcoholic steatohepatitis (NASH): Secondary | ICD-10-CM | POA: Diagnosis not present

## 2023-05-06 DIAGNOSIS — R7401 Elevation of levels of liver transaminase levels: Secondary | ICD-10-CM | POA: Diagnosis not present

## 2023-05-07 DIAGNOSIS — F331 Major depressive disorder, recurrent, moderate: Secondary | ICD-10-CM | POA: Diagnosis not present

## 2023-05-07 DIAGNOSIS — F411 Generalized anxiety disorder: Secondary | ICD-10-CM | POA: Diagnosis not present

## 2023-05-07 DIAGNOSIS — F900 Attention-deficit hyperactivity disorder, predominantly inattentive type: Secondary | ICD-10-CM | POA: Diagnosis not present

## 2023-05-13 DIAGNOSIS — F411 Generalized anxiety disorder: Secondary | ICD-10-CM | POA: Diagnosis not present

## 2023-05-13 DIAGNOSIS — F900 Attention-deficit hyperactivity disorder, predominantly inattentive type: Secondary | ICD-10-CM | POA: Diagnosis not present

## 2023-05-13 DIAGNOSIS — F331 Major depressive disorder, recurrent, moderate: Secondary | ICD-10-CM | POA: Diagnosis not present

## 2023-05-16 ENCOUNTER — Ambulatory Visit
Admission: RE | Admit: 2023-05-16 | Discharge: 2023-05-16 | Disposition: A | Discharge status: Home / Self Care | Payer: Medicare HMO | Source: Ambulatory Visit | Attending: Internal Medicine | Admitting: Internal Medicine

## 2023-05-16 DIAGNOSIS — Z1231 Encounter for screening mammogram for malignant neoplasm of breast: Secondary | ICD-10-CM

## 2023-05-21 ENCOUNTER — Other Ambulatory Visit: Payer: Self-pay | Admitting: Family Medicine

## 2023-05-21 DIAGNOSIS — E1165 Type 2 diabetes mellitus with hyperglycemia: Secondary | ICD-10-CM

## 2023-05-27 ENCOUNTER — Ambulatory Visit: Payer: Medicare HMO | Admitting: Dietician

## 2023-05-27 DIAGNOSIS — F331 Major depressive disorder, recurrent, moderate: Secondary | ICD-10-CM | POA: Diagnosis not present

## 2023-05-27 DIAGNOSIS — G8929 Other chronic pain: Secondary | ICD-10-CM | POA: Diagnosis not present

## 2023-05-27 DIAGNOSIS — M5440 Lumbago with sciatica, unspecified side: Secondary | ICD-10-CM | POA: Diagnosis not present

## 2023-05-27 DIAGNOSIS — Z6828 Body mass index (BMI) 28.0-28.9, adult: Secondary | ICD-10-CM | POA: Diagnosis not present

## 2023-05-27 DIAGNOSIS — Z79899 Other long term (current) drug therapy: Secondary | ICD-10-CM | POA: Diagnosis not present

## 2023-05-27 DIAGNOSIS — E119 Type 2 diabetes mellitus without complications: Secondary | ICD-10-CM | POA: Diagnosis not present

## 2023-06-07 ENCOUNTER — Ambulatory Visit: Payer: Medicare HMO | Attending: Family | Admitting: Family

## 2023-06-07 ENCOUNTER — Other Ambulatory Visit: Payer: Self-pay

## 2023-06-07 ENCOUNTER — Encounter: Payer: Self-pay | Admitting: Family

## 2023-06-07 ENCOUNTER — Emergency Department (HOSPITAL_COMMUNITY)
Admission: EM | Admit: 2023-06-07 | Discharge: 2023-06-07 | Disposition: A | Discharge status: Home / Self Care | Payer: Medicare HMO | Attending: Emergency Medicine | Admitting: Emergency Medicine

## 2023-06-07 ENCOUNTER — Emergency Department (HOSPITAL_COMMUNITY): Payer: Medicare HMO

## 2023-06-07 ENCOUNTER — Encounter (HOSPITAL_COMMUNITY): Payer: Self-pay

## 2023-06-07 VITALS — BP 143/94 | HR 87 | Wt 140.4 lb

## 2023-06-07 DIAGNOSIS — G20C Parkinsonism, unspecified: Secondary | ICD-10-CM | POA: Insufficient documentation

## 2023-06-07 DIAGNOSIS — R251 Tremor, unspecified: Secondary | ICD-10-CM

## 2023-06-07 DIAGNOSIS — I152 Hypertension secondary to endocrine disorders: Secondary | ICD-10-CM

## 2023-06-07 DIAGNOSIS — E1159 Type 2 diabetes mellitus with other circulatory complications: Secondary | ICD-10-CM

## 2023-06-07 DIAGNOSIS — Z7982 Long term (current) use of aspirin: Secondary | ICD-10-CM | POA: Insufficient documentation

## 2023-06-07 DIAGNOSIS — Z794 Long term (current) use of insulin: Secondary | ICD-10-CM

## 2023-06-07 DIAGNOSIS — Z7985 Long-term (current) use of injectable non-insulin antidiabetic drugs: Secondary | ICD-10-CM | POA: Diagnosis not present

## 2023-06-07 LAB — CBC WITH DIFFERENTIAL/PLATELET
Abs Immature Granulocytes: 0.03 10*3/uL (ref 0.00–0.07)
Basophils Absolute: 0.1 10*3/uL (ref 0.0–0.1)
Basophils Relative: 1 %
Eosinophils Absolute: 0.2 10*3/uL (ref 0.0–0.5)
Eosinophils Relative: 3 %
HCT: 41.5 % (ref 36.0–46.0)
Hemoglobin: 14.7 g/dL (ref 12.0–15.0)
Immature Granulocytes: 1 %
Lymphocytes Relative: 41 %
Lymphs Abs: 2.6 10*3/uL (ref 0.7–4.0)
MCH: 29.9 pg (ref 26.0–34.0)
MCHC: 35.4 g/dL (ref 30.0–36.0)
MCV: 84.3 fL (ref 80.0–100.0)
Monocytes Absolute: 0.4 10*3/uL (ref 0.1–1.0)
Monocytes Relative: 6 %
Neutro Abs: 3.1 10*3/uL (ref 1.7–7.7)
Neutrophils Relative %: 48 %
Platelets: 286 10*3/uL (ref 150–400)
RBC: 4.92 MIL/uL (ref 3.87–5.11)
RDW: 12.9 % (ref 11.5–15.5)
WBC: 6.4 10*3/uL (ref 4.0–10.5)
nRBC: 0 % (ref 0.0–0.2)

## 2023-06-07 LAB — COMPREHENSIVE METABOLIC PANEL
ALT: 44 U/L (ref 0–44)
AST: 44 U/L — ABNORMAL HIGH (ref 15–41)
Albumin: 4.2 g/dL (ref 3.5–5.0)
Alkaline Phosphatase: 149 U/L — ABNORMAL HIGH (ref 38–126)
Anion gap: 13 (ref 5–15)
BUN: 7 mg/dL — ABNORMAL LOW (ref 8–23)
CO2: 19 mmol/L — ABNORMAL LOW (ref 22–32)
Calcium: 9.4 mg/dL (ref 8.9–10.3)
Chloride: 103 mmol/L (ref 98–111)
Creatinine, Ser: 0.6 mg/dL (ref 0.44–1.00)
GFR, Estimated: >60 mL/min (ref >60)
Glucose, Bld: 224 mg/dL — ABNORMAL HIGH (ref 70–99)
Potassium: 4.4 mmol/L (ref 3.5–5.1)
Sodium: 135 mmol/L (ref 135–145)
Total Bilirubin: 0.7 mg/dL (ref <1.2)
Total Protein: 7.7 g/dL (ref 6.5–8.1)

## 2023-06-07 NOTE — ED Triage Notes (Signed)
Pt reports she was sent here by her doctor for a head CT. Pt states she went to him because she has been having shaking of her head for the last three months. Denies HA, blurred vision, unilateral weakness. Pt awake, alert, appropriate. Denies recent fever. VSS.

## 2023-06-07 NOTE — Progress Notes (Signed)
Amy Glass, is a 62 y.o. female  RUE:[id number]  JYN:[id number]  DOB - 02/11/1961  Subjective:   Chief Complaint and HPI: Amy Glass is a 62 y.o. female here today with complaints of "shaking" to one side (right) of her body for the past 3 months. Tremors were first noticed by her family members.  Patient was not evaluated for her symptoms until today. She reports shaking mainly to her right upper extremity,  worse when she is holding a glass of water or talking on the phone. Patient reports no visual loss, headaches, double vision, chest pain, shortness of breath, fever, chills, or any recent viral infection.  Patient denies family history of Parkinson's disease or any other neurological conditions.  Denies increased stress, alcohol use, newer medication, personal or family history of thyroid disease.  Patient reports history of diabetes, ADHD, HTN, depression, bipolar, dysphagia and high cholesterol among others.  All stable at home, per patient.    ED/Hospital notes reviewed.   Social History: reviewed Family history: reviewed  ROS:   Constitutional:  No f/c, No night sweats, No unexplained weight loss. EENT:  No vision changes, No blurry vision, No hearing changes. No mouth, throat, or ear problems.  Respiratory: No cough, No SOB Cardiac: No CP, no palpitations GI:  No abd pain, No N/V/D. GU: No Urinary s/sx Musculoskeletal: No joint pain Neuro: . Report Shakings. No headache, no dizziness, no motor weakness.  Skin: No rash Endocrine:  No polydipsia. No polyuria.  Psych: Denies SI/HI  No problems updated.  ALLERGIES: Allergies  Allergen Reactions   Nitroglycerin Other (See Comments)    Migraines, (only tried nitro patch. Has never tried the pills)   Tricor [Fenofibrate]     Stomach upset    PAST MEDICAL HISTORY: Past Medical History:  Diagnosis Date   ADHD (attention deficit hyperactivity disorder)    Allergy    Anxiety    as child    COVID 2020   mild    Depression    as child   Diabetes mellitus without complication (HCC) Dx 2003   Diabetes mellitus, type II (HCC)    Diverticulosis    GERD (gastroesophageal reflux disease)    Hiatal hernia    Hyperlipidemia    Hyperplastic colon polyp    Hypertension Dx 2003   Schatzki's ring     MEDICATIONS AT HOME: Prior to Admission medications   Medication Sig Start Date End Date Taking? Authorizing Provider  ACCU-CHEK GUIDE test strip USE AS DIRECTED TO TEST BLOOD SUGAR ONCE DAILY 05/21/23  Yes Marcine Matar, MD  Accu-Chek Softclix Lancets lancets Use to check blood sugar TID. E11.65 06/18/22  Yes Marcine Matar, MD  amLODipine (NORVASC) 10 MG tablet TAKE 1 TABLET EVERY DAY 05/05/23  Yes Marcine Matar, MD  amphetamine-dextroamphetamine (ADDERALL) 20 MG tablet Take 20 mg by mouth 2 (two) times daily. 02/14/21  Yes [provider]  ARIPiprazole (ABILIFY) 2 MG tablet Take 2 mg by mouth daily. 11/08/20  Yes [provider]  aspirin EC 81 MG tablet Take 81 mg by mouth daily. Swallow whole.   Yes [provider]  atorvastatin (LIPITOR) 20 MG tablet TAKE 1 TABLET EVERY DAY 01/22/23  Yes Marcine Matar, MD  Blood Glucose Monitoring Suppl (ACCU-CHEK GUIDE) w/Device KIT Use to check blood sugar TID. E11.65 06/18/22  Yes Marcine Matar, MD  Continuous Blood Gluc Receiver (FREESTYLE LIBRE 3 READER) DEVI 1 each by Does not apply route daily. 08/22/22  Yes  Marcine Matar, MD  Continuous Blood Gluc Sensor (FREESTYLE LIBRE 3 SENSOR) MISC Change Q 2 weeks 08/22/22  Yes Marcine Matar, MD  desvenlafaxine (PRISTIQ) 50 MG 24 hr tablet Take 50 mg by mouth daily. 02/15/21  Yes [provider]  fexofenadine (ALLEGRA) 180 MG tablet Take 1 tablet (180 mg total) by mouth daily. 10/19/21  Yes Padgett, Pilar Grammes, MD  HYDROcodone-acetaminophen [facility]) 10-325 MG tablet Take 1 tablet by mouth 2 (two) times daily as needed. 03/19/23  Yes [provider]   hydrOXYzine (ATARAX) 25 MG tablet Take 25 mg by mouth daily. 03/13/22  Yes [provider]  insulin glargine (LANTUS SOLOSTAR) 100 UNIT/ML Solostar Pen Inject 22 Units into the skin daily. Discontinue Evaristo Bury 12/30/22  Yes Marcine Matar, MD  Insulin Pen Needle (PEN NEEDLES) 32G X 4 MM MISC Use to inject insulin once daily. 05/30/22  Yes Marcine Matar, MD  losartan (COZAAR) 100 MG tablet TAKE 1 TABLET EVERY DAY (NEED MD APPOINTMENT) 04/15/23  Yes Marcine Matar, MD  Magnesium 400 MG CAPS Take 400 mg by mouth daily. 03/20/22  Yes Marcine Matar, MD  metFORMIN (GLUCOPHAGE-XR) 500 MG 24 hr tablet 2 tabs PO twice a day.  Stop Janumet 12/30/22  Yes Marcine Matar, MD  Multiple Vitamins-Minerals (MULTIVITAMIN WITH MINERALS) tablet Take 1 tablet by mouth daily.   Yes [provider]  nystatin cream (MYCOSTATIN) Apply 1 application topically 2 (two) times daily. 08/07/21  Yes Marcine Matar, MD  Olopatadine HCl (PATADAY) 0.2 % SOLN Place 1 drop into both eyes daily as needed (allergies). 03/14/22  Yes McClung, Angela M, PA-C  omeprazole (PRILOSEC) 40 MG capsule Take 1 capsule (40 mg total) by mouth daily. 02/12/22  Yes Pyrtle, Carie Caddy, MD  Semaglutide,0.25 or 0.5MG /DOS, (OZEMPIC, 0.25 OR 0.5 MG/DOSE,) 2 MG/3ML SOPN INJECT 0.25MG  UNDER THE SKIN ONE TIME WEEKLY 03/20/23  Yes Marcine Matar, MD  traMADol-acetaminophen [facility]) 37.5-325 MG tablet  04/12/22  Yes [provider]  traZODone (DESYREL) 100 MG tablet Take 1 tablet (100 mg total) by mouth at bedtime as needed for sleep. 06/14/20  Yes Hurst, Teresa T, PA-C  VASCEPA 1 g capsule TAKE 2 CAPSULES TWICE DAILY 07/03/22  Yes Hilty, Lisette Abu, MD  VRAYLAR 3 MG capsule Take 3 mg by mouth daily. 02/28/23  Yes [provider]     Objective:  EXAM:   Vitals:   06/07/23 0945  BP: (!) 143/94  Pulse: 87  SpO2: 96%  Weight: 140 lb 6.4 oz (63.7 kg)   General appearance : A&OX3. NAD.  Non-toxic-appearing HEENT: Atraumatic and Normocephalic.  PERRLA. EOM intact.  TM clear B. Mouth-MMM, post pharynx WNL w/o erythema, No PND. Neck: supple, no JVD. No cervical lymphadenopathy. No thyromegaly Chest/Lungs:  Breathing-non-labored, Good air entry bilaterally, breath sounds normal without rales, rhonchi, or wheezing  CVS: S1 S2 regular, no murmurs, gallops, rubs  Abdomen: Bowel sounds present, Non tender and not distended with no gaurding, rigidity or rebound. Extremities: Bilateral Lower Ext shows no edema, both legs are warm to touch with = pulse throughout Neurology: Tremors to right upper extremity and right lower extremity appreciated. Other neurology physical exam is unremarkable. Psych:  TP linear. J/I WNL. Normal speech. Appropriate eye contact and affect.  Skin:  No Rash  Data Review Lab Results  Component Value Date   HGBA1C 10.7 (A) 12/30/2022   HGBA1C 9.6 (A) 06/18/2022   HGBA1C 8.3 (A) 03/13/2022    Assessment &  Plan  Amy Glass is a 62 year old female seen this morning with complaints of tremors to the right side of her body for the past 3 months.  She has no other symptoms and is taking all her medications as prescribed. No family history of Parkinson's disease. Her blood pressure is high this morning.   PLAN:  1. Tremors of nervous system       - Patient referred to the local ED for further evaluation       - Reminded to report new or worsening symptoms to the clinic or local ED       - Follow up in 3 months with PCP  2. Hypertension associated with type 2 diabetes mellitus (HCC)     - Take blood pressure and diabetes medications as prescribed.     - Monitor blood sugars as ordered and reports consistent BG < 70 or > 250 to the clinic or local ED     - Increase physical activities as tolerated and eat plant based diet.     Patient have been counseled extensively about nutrition and exercise  No follow-ups on file.  The patient was given clear  instructions to go to ER or return to medical center if symptoms don't improve, worsen or new problems develop. The patient verbalized understanding. The patient was told to call to get lab results if they haven't heard anything in the next week.     Eleonore Chiquito, DNP, APRN, FNP-C [facility] [place], [place] [phone]   06/07/2023, 10:49 AM

## 2023-06-07 NOTE — ED Provider Notes (Signed)
[place] EMERGENCY DEPARTMENT AT [facility] Provider Note   CSN: [id number] Arrival date & time: 06/07/23  1149     History  Chief Complaint  Patient presents with   Tremors    Amy Glass is a 62 y.o. female.  62 year old female with prior medical history as detailed below presents for evaluation.  Patient reported to her PCP intermittent tremor to the right arm times approximately 3 months.  Patient was advised that she could have Parkinson's disease.  Patient was advised to come to the ED for CT scan.  Patient is otherwise without complaint.  The history is provided by the patient and medical records.       Home Medications Prior to Admission medications   Medication Sig Start Date End Date Taking? Authorizing Provider  ACCU-CHEK GUIDE test strip USE AS DIRECTED TO TEST BLOOD SUGAR ONCE DAILY 05/21/23   Marcine Matar, MD  Accu-Chek Softclix Lancets lancets Use to check blood sugar TID. E11.65 06/18/22   Marcine Matar, MD  amLODipine (NORVASC) 10 MG tablet TAKE 1 TABLET EVERY DAY 05/05/23   Marcine Matar, MD  amphetamine-dextroamphetamine (ADDERALL) 20 MG tablet Take 20 mg by mouth 2 (two) times daily. 02/14/21   [provider]  ARIPiprazole (ABILIFY) 2 MG tablet Take 2 mg by mouth daily. 11/08/20   [provider]  aspirin EC 81 MG tablet Take 81 mg by mouth daily. Swallow whole.    [provider]  atorvastatin (LIPITOR) 20 MG tablet TAKE 1 TABLET EVERY DAY 01/22/23   Marcine Matar, MD  Blood Glucose Monitoring Suppl (ACCU-CHEK GUIDE) w/Device KIT Use to check blood sugar TID. E11.65 06/18/22   Marcine Matar, MD  Continuous Blood Gluc Receiver (FREESTYLE LIBRE 3 READER) DEVI 1 each by Does not apply route daily. 08/22/22   Marcine Matar, MD  Continuous Blood Gluc Sensor (FREESTYLE LIBRE 3 SENSOR) MISC Change Q 2 weeks 08/22/22   Marcine Matar, MD  desvenlafaxine (PRISTIQ) 50 MG 24 hr tablet Take  50 mg by mouth daily. 02/15/21   [provider]  fexofenadine (ALLEGRA) 180 MG tablet Take 1 tablet (180 mg total) by mouth daily. 10/19/21   Marcelyn Bruins, MD  HYDROcodone-acetaminophen (NORCO) 10-325 MG tablet Take 1 tablet by mouth 2 (two) times daily as needed. 03/19/23   [provider]  hydrOXYzine (ATARAX) 25 MG tablet Take 25 mg by mouth daily. 03/13/22   [provider]  insulin glargine (LANTUS SOLOSTAR) 100 UNIT/ML Solostar Pen Inject 22 Units into the skin daily. Discontinue Evaristo Bury 12/30/22   Marcine Matar, MD  Insulin Pen Needle (PEN NEEDLES) 32G X 4 MM MISC Use to inject insulin once daily. 05/30/22   Marcine Matar, MD  losartan (COZAAR) 100 MG tablet TAKE 1 TABLET EVERY DAY (NEED MD APPOINTMENT) 04/15/23   Marcine Matar, MD  Magnesium 400 MG CAPS Take 400 mg by mouth daily. 03/20/22   Marcine Matar, MD  metFORMIN (GLUCOPHAGE-XR) 500 MG 24 hr tablet 2 tabs PO twice a day.  Stop Janumet 12/30/22   Marcine Matar, MD  Multiple Vitamins-Minerals (MULTIVITAMIN WITH MINERALS) tablet Take 1 tablet by mouth daily.    [provider]  nystatin cream (MYCOSTATIN) Apply 1 application topically 2 (two) times daily. 08/07/21   Marcine Matar, MD  Olopatadine HCl (PATADAY) 0.2 % SOLN Place 1 drop into both eyes daily as needed (allergies). 03/14/22   Anders Simmonds, PA-C  omeprazole (PRILOSEC) 40 MG capsule Take 1 capsule (40 mg total) by mouth daily. 02/12/22   Pyrtle, Carie Caddy, MD  Semaglutide,0.25 or 0.5MG /DOS, (OZEMPIC, 0.25 OR 0.5 MG/DOSE,) 2 MG/3ML SOPN INJECT 0.25MG  UNDER THE SKIN ONE TIME WEEKLY 03/20/23   Marcine Matar, MD  traMADol-acetaminophen [facility]) 37.5-325 MG tablet  04/12/22   [provider]  traZODone (DESYREL) 100 MG tablet Take 1 tablet (100 mg total) by mouth at bedtime as needed for sleep. 06/14/20   Melony Overly T, PA-C  VASCEPA 1 g capsule TAKE 2 CAPSULES TWICE DAILY 07/03/22   Hilty, Lisette Abu, MD  VRAYLAR 3 MG capsule Take 3 mg by mouth daily. 02/28/23   [provider]      Allergies    Nitroglycerin and Tricor [fenofibrate]    Review of Systems   Review of Systems  All other systems reviewed and are negative.   Physical Exam Updated Vital Signs BP (!) 135/91   Pulse (!) 103   Temp 98.7 F (37.1 C)   Resp 15   LMP 05/30/2015   SpO2 99%  Physical Exam Vitals and nursing note reviewed.  Constitutional:      General: She is not in acute distress.    Appearance: Normal appearance. She is well-developed.  HENT:     Head: Normocephalic and atraumatic.  Eyes:     Conjunctiva/sclera: Conjunctivae normal.     Pupils: Pupils are equal, round, and reactive to light.  Cardiovascular:     Rate and Rhythm: Normal rate and regular rhythm.     Heart sounds: Normal heart sounds.  Pulmonary:     Effort: Pulmonary effort is normal. No respiratory distress.     Breath sounds: Normal breath sounds.  Abdominal:     General: There is no distension.     Palpations: Abdomen is soft.     Tenderness: There is no abdominal tenderness.  Musculoskeletal:        General: No deformity. Normal range of motion.     Cervical back: Normal range of motion and neck supple.  Skin:    General: Skin is warm and dry.  Neurological:     General: No focal deficit present.     Mental Status: She is alert and oriented to person, place, and time.     Comments: Mild intermittent tremor noted to the right upper extremity.     ED Results / Procedures / Treatments   Labs (all labs ordered are listed, but only abnormal results are displayed) Labs Reviewed  CBC WITH DIFFERENTIAL/PLATELET  COMPREHENSIVE METABOLIC PANEL    EKG None  Radiology CT Head Wo Contrast  Result Date: 06/07/2023 CLINICAL DATA:  Tremor.  Shaking. EXAM: CT HEAD WITHOUT CONTRAST TECHNIQUE: Contiguous axial images were obtained from the base of the skull through the vertex without intravenous contrast.  RADIATION DOSE REDUCTION: This exam was performed according to the departmental dose-optimization program which includes automated exposure control, adjustment of the mA and/or kV according to patient size and/or use of iterative reconstruction technique. COMPARISON:  CT head without contrast 04/13/2015 FINDINGS: Brain: No acute infarct, hemorrhage, or mass lesion is present. No significant white matter lesions are present. Deep brain nuclei are within normal limits. The ventricles are of normal size. No significant extraaxial fluid collection is present. The brainstem and cerebellum are within normal limits. Midline structures are within normal limits. Vascular: No hyperdense vessel or unexpected calcification. Skull: Calvarium is intact. No focal lytic or blastic lesions are present. No  significant extracranial soft tissue lesion is present. Sinuses/Orbits: The paranasal sinuses and mastoid air cells are clear. The globes and orbits are within normal limits. IMPRESSION: Normal CT appearance of the brain. Electronically Signed   By: Marin Roberts M.D.   On: 06/07/2023 12:55    Procedures Procedures    Medications Ordered in ED Medications - No data to display  ED Course/ Medical Decision Making/ A&P                                 Medical Decision Making Amount and/or Complexity of Data Reviewed Labs: ordered. Radiology: ordered.    Medical Screen Complete  This patient presented to the ED with complaint of tremor.  This complaint involves an extensive number of treatment options.   This presentation is: Chronic, Self-Limited, Previously Undiagnosed, Uncertain Prognosis, and Complicated  Patient is presenting from her PCPs office after complaining of mild tremor x 3 months.  Patient appears to have been sent by the PCP for CT of the brain.  Shortly after CT head completed, patient desired discharge.  She did not want to wait for all of her labs to result.  She had been advised  by her PCP that her tremor could suggest possible Parkinson's disease.  She reports that she is being arranged for follow-up with neurology.  Importance of close follow-up is repeatedly stressed.  Strict return precautions given and understood.   Additional history obtained:  External records from outside sources obtained and reviewed including prior ED visits and prior Inpatient records.    Problem List / ED Course:  Chronic tremor   Reevaluation:  After the interventions noted above, I reevaluated the patient and found that they have: stayed the same    Disposition:  After consideration of the diagnostic results and the patients response to treatment, I feel that the patent would benefit from close outpatient follow-up.          Final Clinical Impression(s) / ED Diagnoses Final diagnoses:  Tremor    Rx / DC Orders ED Discharge Orders     None         Wynetta Fines, MD 06/07/23 1554

## 2023-06-07 NOTE — Discharge Instructions (Addendum)
Return for any problem.  ?

## 2023-06-09 ENCOUNTER — Other Ambulatory Visit: Payer: Self-pay | Admitting: Internal Medicine

## 2023-06-09 DIAGNOSIS — E1165 Type 2 diabetes mellitus with hyperglycemia: Secondary | ICD-10-CM

## 2023-06-10 DIAGNOSIS — F331 Major depressive disorder, recurrent, moderate: Secondary | ICD-10-CM | POA: Diagnosis not present

## 2023-06-12 DIAGNOSIS — F331 Major depressive disorder, recurrent, moderate: Secondary | ICD-10-CM | POA: Diagnosis not present

## 2023-06-17 DIAGNOSIS — F331 Major depressive disorder, recurrent, moderate: Secondary | ICD-10-CM | POA: Diagnosis not present

## 2023-06-24 ENCOUNTER — Encounter: Payer: Self-pay | Admitting: Pharmacist

## 2023-06-24 ENCOUNTER — Other Ambulatory Visit (HOSPITAL_BASED_OUTPATIENT_CLINIC_OR_DEPARTMENT_OTHER): Payer: Medicare HMO | Admitting: Pharmacist

## 2023-06-24 DIAGNOSIS — E1165 Type 2 diabetes mellitus with hyperglycemia: Secondary | ICD-10-CM

## 2023-06-24 NOTE — Progress Notes (Signed)
Pharmacy Quality Measure Review  This patient is appearing on a report for being at risk of failing the adherence measure for cholesterol (statin) medications this calendar year.   Medication: atorvastatin Last fill date: 04/27/2023 for 90 day supply  Insurance report was not up to date. No action needed at this time.  Of note, LF date was 01/22/23 at the time of this report with an abs fail date of 07/02/2023. Atorvastatin was filled prior to that so risk of failure is minimal at this time.   Butch Penny, PharmD, [place]y [place], CPP Clinical Pharmacist [facility] [phone]

## 2023-06-27 ENCOUNTER — Other Ambulatory Visit: Payer: Self-pay | Admitting: Internal Medicine

## 2023-06-27 DIAGNOSIS — I10 Essential (primary) hypertension: Secondary | ICD-10-CM

## 2023-07-03 ENCOUNTER — Other Ambulatory Visit: Payer: Self-pay | Admitting: Internal Medicine

## 2023-07-03 DIAGNOSIS — E1165 Type 2 diabetes mellitus with hyperglycemia: Secondary | ICD-10-CM

## 2023-07-03 NOTE — Telephone Encounter (Signed)
Requested medication (s) are due for refill today:   Yes as ordered 2 tablets twice daily.  These are 24 hr tablets.    Requested medication (s) are on the active medication list:   Yes  Future visit scheduled:   Yes with Dr. Laural Benes on 08/25/2023   Last ordered: 06/09/2023 #90, 0 refills  Returned for dose clarification.   Ordered #90 as taking one tablet daily.   Instructions are to take 2 tablets twice daily.   These are 24 hr tablets.  Did Dr. Laural Benes mean for these to be taken twice a day?         Requested Prescriptions  Pending Prescriptions Disp Refills   metFORMIN (GLUCOPHAGE-XR) 500 MG 24 hr tablet [Pharmacy Med Name: metFORMIN HCl ER Oral Tablet Extended Release 24 Hour 500 MG] 90 tablet 0    Sig: TAKE 2 TABLETS TWICE DAILY (NEED MD APPOINTMENT FOR REFILLS)     Endocrinology:  Diabetes - Biguanides Failed - 07/03/2023 12:29 PM      Failed - HBA1C is between 0 and 7.9 and within 180 days    HbA1c, POC (controlled diabetic range)  Date Value Ref Range Status  12/30/2022 10.7 (A) 0.0 - 7.0 % Final         Failed - B12 Level in normal range and within 720 days    Vitamin B-12  Date Value Ref Range Status  12/02/2014 1,405 (H) 211 - 911 pg/mL Final         Passed - Cr in normal range and within 360 days    Creat  Date Value Ref Range Status  12/21/2015 0.56 0.50 - 1.05 mg/dL Final   Creatinine, Ser  Date Value Ref Range Status  06/07/2023 0.60 0.44 - 1.00 mg/dL Final    Comment:    POST-ULTRACENTRIFUGATION         Passed - eGFR in normal range and within 360 days    GFR, Est African American  Date Value Ref Range Status  12/21/2015 >89 >=60 mL/min Final   GFR calc Af Amer  Date Value Ref Range Status  04/28/2020 109 >59 mL/min/1.73 Final    Comment:    **[facility] currently reports eGFR in compliance with the current**   recommendations of the [employer]. [facility] will   update reporting as new guidelines are published from the NKF-ASN   Task  force.    GFR, Est Non African American  Date Value Ref Range Status  12/21/2015 >89 >=60 mL/min Final    Comment:      The estimated GFR is a calculation valid for adults (>=58 years old) that uses the CKD-EPI algorithm to adjust for age and sex. It is   not to be used for children, pregnant women, hospitalized patients,    patients on dialysis, or with rapidly changing kidney function. According to the NKDEP, eGFR >89 is normal, 60-89 shows mild impairment, 30-59 shows moderate impairment, 15-29 shows severe impairment and <15 is ESRD.      GFR, Estimated  Date Value Ref Range Status  06/07/2023 >60 >60 mL/min Final    Comment:    (NOTE) Calculated using the CKD-EPI Creatinine Equation (2021)    eGFR  Date Value Ref Range Status  02/26/2021 96 >59 mL/min/1.73 Final   EGFR  Date Value Ref Range Status  10/16/2022 102.0  Final    Comment:    Abstracted by HIM  10/16/2022 123.0  Final    Comment:    Abstracted by  HIM         Passed - Valid encounter within last 6 months    Recent Outpatient Visits           3 weeks ago Tremors of nervous system   [facility] [place]. [facility] [place], Jomarie Longs, FNP   4 months ago Uncontrolled diabetes mellitus with hyperglycemia, with long-term current use of insulin (HCC)   [facility] [place]. [facility] [place], Marylene Land M, [place]   6 months ago Diabetes mellitus without complication [facility])   [facility] - [facility] Of [place]. [facility] Lois Huxley, [place] L, RPH-CPP   6 months ago Uncontrolled diabetes mellitus with hyperglycemia, with long-term current use of insulin (HCC)   [facility] - [facility] Dept Of [place]. [facility] Jonah Blue B, MD   10 months ago Uncontrolled diabetes mellitus with hyperglycemia, with long-term current use of insulin [facility])    [facility] Of [place]. [facility] Marcine Matar, MD       Future Appointments             In 1 month Laural Benes Binnie Rail, MD [facility] - A Dept Of [place] Brideg[place]. [facility]            Passed - CBC within normal limits and completed in the last 12 months    WBC  Date Value Ref Range Status  06/07/2023 6.4 4.0 - 10.5 K/uL Final   RBC  Date Value Ref Range Status  06/07/2023 4.92 3.87 - 5.11 MIL/uL Final   Hemoglobin  Date Value Ref Range Status  06/07/2023 14.7 12.0 - 15.0 g/dL Final  46/96/2952 84.1 11.1 - 15.9 g/dL Final   HCT  Date Value Ref Range Status  06/07/2023 41.5 36.0 - 46.0 % Final   Hematocrit  Date Value Ref Range Status  01/24/2021 39.3 34.0 - 46.6 % Final   MCHC  Date Value Ref Range Status  06/07/2023 35.4 30.0 - 36.0 g/dL Final   [facility]  Date Value Ref Range Status  06/07/2023 29.9 26.0 - 34.0 pg Final   MCV  Date Value Ref Range Status  06/07/2023 84.3 80.0 - 100.0 fL Final  01/24/2021 87 79 - 97 fL Final   No results found for: "PLTCOUNTKUC", "LABPLAT", "POCPLA" RDW  Date Value Ref Range Status  06/07/2023 12.9 11.5 - 15.5 % Final  01/24/2021 13.3 11.7 - 15.4 % Final

## 2023-07-17 ENCOUNTER — Other Ambulatory Visit: Payer: Self-pay | Admitting: Internal Medicine

## 2023-07-17 DIAGNOSIS — I152 Hypertension secondary to endocrine disorders: Secondary | ICD-10-CM

## 2023-07-24 ENCOUNTER — Ambulatory Visit: Payer: Self-pay

## 2023-07-24 NOTE — Telephone Encounter (Signed)
 Summary: Question UTI   burning w/urination.. frequent .. (4 days) ... Hand shaking (l/r) 5 month.     Chief Complaint: Urinary frequency, burning. No availability, asking to be worked in. Symptoms: Above Frequency: 4 days Pertinent Negatives: Patient denies  Disposition: [] ED /[] Urgent Care (no appt availability in office) / [] Appointment(In office/virtual)/ []  [place] [facility]/ [] Home Care/ [] Refused Recommended Disposition /[] [place] [facility]/ [x]  Follow-up with PCP Additional Notes: Please advise pt.  Reason for Disposition  Urinating more frequently than usual (i.e., frequency)  Answer Assessment - Initial Assessment Questions 1. SYMPTOM: What's the main symptom you're concerned about? (e.g., frequency, incontinence)     Burning, frequency 2. ONSET: When did the    start?     4 days 3. PAIN: Is there any pain? If Yes, ask: How bad is it? (Scale: 1-10; mild, moderate, severe)     Mild 4. CAUSE: What do you think is causing the symptoms?     UTI 5. OTHER SYMPTOMS: Do you have any other symptoms? (e.g., blood in urine, fever, flank pain, pain with urination)     No 6. PREGNANCY: Is there any chance you are pregnant? When was your last menstrual period?     No  Protocols used: Urinary Symptoms-A-AH

## 2023-07-25 NOTE — Telephone Encounter (Signed)
 Spoke with patient . Patient voiced that she was seen at [facility] and was given antibiotics for UTI

## 2023-07-25 NOTE — Telephone Encounter (Signed)
 Call placed to patient unable to reach message left on VM.

## 2023-08-06 ENCOUNTER — Encounter: Payer: Self-pay | Admitting: Neurology

## 2023-08-06 ENCOUNTER — Other Ambulatory Visit: Payer: Self-pay

## 2023-08-06 DIAGNOSIS — R202 Paresthesia of skin: Secondary | ICD-10-CM

## 2023-08-07 ENCOUNTER — Other Ambulatory Visit (HOSPITAL_BASED_OUTPATIENT_CLINIC_OR_DEPARTMENT_OTHER): Payer: Self-pay | Admitting: Internal Medicine

## 2023-08-07 DIAGNOSIS — E781 Pure hyperglyceridemia: Secondary | ICD-10-CM

## 2023-08-25 ENCOUNTER — Ambulatory Visit: Payer: Medicare HMO | Admitting: Internal Medicine

## 2023-08-26 ENCOUNTER — Other Ambulatory Visit: Payer: Self-pay | Admitting: Internal Medicine

## 2023-08-26 DIAGNOSIS — E1165 Type 2 diabetes mellitus with hyperglycemia: Secondary | ICD-10-CM

## 2023-08-29 ENCOUNTER — Encounter: Payer: Medicare HMO | Admitting: Neurology

## 2023-09-01 NOTE — Progress Notes (Signed)
Assessment/Plan:   Tremor  -I had a long counseling session with the patient today.  I discussed with the patient that she likely has tremor, in part due to vraylar.  I explained that one clinically cannot tell the difference between idiopathic parkinsons disease and secondary parkinsonism from medication.  I also explained that even if one is able to get off of the medication, it can take up to 6 months to resolve.  We discussed that her Adderall can also cause tremor..  I did not advise that the patient go off of medication, as this needs to be discussed with the patients prescribing NP at the [facility].  We also discussed the role that stress can play, as she may have a degree of functional tremor as well.  We discussed the role of counseling.  Patient is going to discuss this all with her prescribing nurse practitioner.    Subjective:   Amy Glass was seen today in the movement disorders clinic for neurologic consultation at the request of Marcine Matar, MD.  The consultation is for the evaluation of a 62-month history of tremor that started in the right upper extremity and has progressed to L hand about 2 months later  Tremor: Yes.     At rest or with activation?  Rest but some with activation  Fam hx of tremor?  No.  Affected by caffeine:  No.  Affected by alcohol:  doesn't drink enough to know  Affected by stress:  No.  Affected by fatigue:  No.  Spills soup if on spoon:  No.  Spills glass of liquid if full:  No.  Affects ADL's (tying shoes, brushing teeth, etc):  No.  Tremor inducing meds:  Yes.   she is on Vraylar (been on it x about 1 year) and prior Abilify (Rx by [employer]); she is on Adderall (been on it for about a year).  She is DM x 20 years and she reports that A1C was "high" Lab Results  Component Value Date   HGBA1C 10.7 (A) 12/30/2022     Other Specific Symptoms:  Voice: no change Sleep: sleeping well Wet Pillows: Yes.  , drools  when "I am deep asleep" Postural symptoms:  No.  Falls?  No. Bradykinesia symptoms: no bradykinesia noted Loss of smell:  No. Loss of taste:  No. Urinary Incontinence:  No. Difficulty Swallowing:  No. Handwriting, micrographia: No. Trouble with ADL's:  No.  Trouble buttoning clothing: No. Depression:  "mostly okay but depends on the day.  The medications have helped." Memory changes:  some trouble with short term memory N/V:  No. Lightheaded:  No.  Syncope: No. Diplopia:  No.  CT brain in November, 2024 was normal.   ALLERGIES:   Allergies  Allergen Reactions   Nitroglycerin Other (See Comments)    Migraines, (only tried nitro patch. Has never tried the pills)   Tricor [Fenofibrate]     Stomach upset    CURRENT MEDICATIONS:  Current Outpatient Medications  Medication Instructions   ACCU-CHEK GUIDE test strip USE AS DIRECTED TO TEST BLOOD SUGAR ONCE DAILY   Accu-Chek Softclix Lancets lancets Use to check blood sugar TID. E11.65   amphetamine-dextroamphetamine (ADDERALL) 20 MG tablet 20 mg, 2 times daily   aspirin EC 81 mg, Daily   Blood Glucose Monitoring Suppl (ACCU-CHEK GUIDE) w/Device KIT Use to check blood sugar TID. E11.65   Continuous Blood Gluc Receiver (FREESTYLE LIBRE 3 READER) DEVI 1 each, Does not apply, Daily  Continuous Blood Gluc Sensor (FREESTYLE LIBRE 3 SENSOR) MISC Change Q 2 weeks   desvenlafaxine (PRISTIQ) 50 mg, Daily   fexofenadine (ALLEGRA) 180 mg, Oral, Daily   HYDROcodone-acetaminophen (NORCO) 10-325 MG tablet 1 tablet, 2 times daily PRN   icosapent Ethyl (VASCEPA) 2 g, Oral, 2 times daily   Insulin Pen Needle (PEN NEEDLES) 32G X 4 MM MISC Use to inject insulin once daily.   Lantus SoloStar 22 Units, Subcutaneous, Daily, Discontinue Tresiba   losartan (COZAAR) 100 mg, Oral, Daily   Magnesium 400 mg, Oral, Daily   metFORMIN (GLUCOPHAGE-XR) 1,000 mg, Oral, 2 times daily with meals   nystatin cream (MYCOSTATIN) 1 application , Topical, 2 times  daily   Semaglutide,0.25 or 0.5MG /DOS, (OZEMPIC, 0.25 OR 0.5 MG/DOSE,) 2 MG/3ML SOPN INJECT 0.25MG  UNDER THE SKIN ONE TIME WEEKLY   traZODone (DESYREL) 100 mg, Oral, At bedtime PRN   Vraylar 3 mg, Daily    Objective:   PHYSICAL EXAMINATION:    VITALS:   Vitals:   09/03/23 1017  BP: (!) 143/87  Pulse: 92  SpO2: 99%  Weight: 141 lb 9.6 oz (64.2 kg)  Height: 4\' 11"  (1.499 m)    GEN:  The patient appears stated age and is in NAD. HEENT:  Normocephalic, atraumatic.  The mucous membranes are moist. The superficial temporal arteries are without ropiness or tenderness. CV:  RRR Lungs:  CTAB Neck/HEME:  There are no carotid bruits bilaterally.  Neurological examination:  Orientation: The patient is alert and oriented x3.  Cranial nerves: There is good facial symmetry.  Extraocular muscles are intact. The visual fields are full to confrontational testing. The speech is fluent and clear. Soft palate rises symmetrically and there is no tongue deviation. Hearing is intact to conversational tone. Sensation: Sensation is intact to light touch throughout (facial, trunk, extremities). Vibration is intact at the bilateral big toe. There is no extinction with double simultaneous stimulation.  Motor: Strength is 5/5 in the bilateral upper and lower extremities.   Shoulder shrug is equal and symmetric.  There is no pronator drift. Deep tendon reflexes: Deep tendon reflexes are 2-/4 at the bilateral biceps, triceps, brachioradialis, patella and achilles. Plantar responses are downgoing bilaterally.  Movement examination: Tone: There is normal tone in the bilateral upper extremities.  The tone in the lower extremities is normal.  Abnormal movements: She has independent left and right upper extremity rest tremor during her history taking that was somewhat variable, but it went away during her examination, including with attempts at distraction procedures.  She had no postural tremor.  She had no  intention tremor.  Archimedes spirals are drawn well.     Coordination:  There is no decremation with RAM's, with any form of RAMS, including alternating supination and pronation of the forearm, hand opening and closing, finger taps, heel taps and toe taps.  Gait and Station: The patient has no difficulty arising out of a deep-seated chair without the use of the hands. The patient's stride length is good with decreased arm swing, L>R.  I have reviewed and interpreted the following labs independently   Chemistry      Component Value Date/Time   NA 135 06/07/2023 1353   NA 138 02/26/2021 1030   K 4.4 06/07/2023 1353   CL 103 06/07/2023 1353   CO2 19 (L) 06/07/2023 1353   BUN 7 (L) 06/07/2023 1353   BUN 12 02/26/2021 1030   CREATININE 0.60 06/07/2023 1353   CREATININE 0.56 12/21/2015 1725  Component Value Date/Time   CALCIUM 9.4 06/07/2023 1353   ALKPHOS 149 (H) 06/07/2023 1353   AST 44 (H) 06/07/2023 1353   ALT 44 06/07/2023 1353   BILITOT 0.7 06/07/2023 1353   BILITOT 0.3 12/30/2022 1220      Lab Results  Component Value Date   TSH 1.625 12/02/2014   Lab Results  Component Value Date   WBC 6.4 06/07/2023   HGB 14.7 06/07/2023   HCT 41.5 06/07/2023   MCV 84.3 06/07/2023   PLT 286 06/07/2023      Total time spent on today's visit was 60 minutes, including both face-to-face time and nonface-to-face time.  Time included that spent on review of records (prior notes available to me/labs/imaging if pertinent), discussing treatment and goals, answering patient's questions and coordinating care.  Cc:  Marcine Matar, MD

## 2023-09-02 ENCOUNTER — Other Ambulatory Visit: Payer: Self-pay | Admitting: Internal Medicine

## 2023-09-02 DIAGNOSIS — E1165 Type 2 diabetes mellitus with hyperglycemia: Secondary | ICD-10-CM

## 2023-09-02 NOTE — Telephone Encounter (Signed)
Last Fill: 03/20/23  Last OV: 06/07/23 Next OV: 04/13/24   Routing to provider for review/authorization.

## 2023-09-02 NOTE — Telephone Encounter (Unsigned)
Copied from CRM [id number]. Topic: Clinical - Medication Refill >> Sep 02, 2023  3:25 PM Ivette P wrote: Most Recent Primary Care Visit:  Provider: Eleonore Chiquito  Department: [facility]  Visit Type: OFFICE VISIT  Date: 06/07/2023  Medication: emaglutide,0.25 or 0.5MG /DOS, (OZEMPIC, 0.25 OR 0.5 MG/DOSE,) 2 MG/3ML SOPN   Has the patient contacted their pharmacy? Yes (Agent: If no, request that the patient contact the pharmacy for the refill. If patient does not wish to contact the pharmacy document the reason why and proceed with request.) (Agent: If yes, when and what did the pharmacy advise?)  Is this the correct pharmacy for this prescription? Yes If no, delete pharmacy and type the correct one.  This is the patient's preferred pharmacy:  [facility] - [place], [place] - [address] [place] [address] Phone: [phone] Fax: [phone]   Has the prescription been filled recently? No  Is the patient out of the medication? No, pt has 2 weeks left.   Has the patient been seen for an appointment in the last year OR does the patient have an upcoming appointment? Yes  Can we respond through MyChart? Yes  Agent: Please be advised that Rx refills may take up to 3 business days. We ask that you follow-up with your pharmacy.

## 2023-09-03 ENCOUNTER — Ambulatory Visit (INDEPENDENT_AMBULATORY_CARE_PROVIDER_SITE_OTHER): Payer: Medicare HMO | Admitting: Neurology

## 2023-09-03 ENCOUNTER — Encounter: Payer: Self-pay | Admitting: Neurology

## 2023-09-03 VITALS — BP 143/87 | HR 92 | Ht 59.0 in | Wt 141.6 lb

## 2023-09-03 DIAGNOSIS — G251 Drug-induced tremor: Secondary | ICD-10-CM | POA: Diagnosis not present

## 2023-09-03 MED ORDER — OZEMPIC (0.25 OR 0.5 MG/DOSE) 2 MG/3ML ~~LOC~~ SOPN: PEN_INJECTOR | SUBCUTANEOUS | 0 refills | Status: DC

## 2023-09-03 NOTE — Patient Instructions (Signed)
We have discussed that your symptoms are due, in part,  to your Vraylar and adderall  medication.  We also discussed the role that stress plays in tremor and counseling can be of value.  You need to contact your prescribing physician or provider to discuss what we discussed today.  Do not just stop the medication on your own.  If you are able to get off of the medication,  it can take up to 6 months to resolve some of the symptoms that you are experiencing.    The physicians and staff at [facility] are committed to providing excellent care. You may receive a survey requesting feedback about your experience at our office. We strive to receive "very good" responses to the survey questions. If you feel that your experience would prevent you from giving the office a "very good " response, please contact our office to try to remedy the situation. We may be reached at [phone]. Thank you for taking the time out of your busy day to complete the survey.

## 2023-09-04 ENCOUNTER — Encounter: Payer: Medicare HMO | Admitting: Neurology

## 2023-10-02 ENCOUNTER — Ambulatory Visit: Payer: Medicare HMO | Attending: Internal Medicine | Admitting: Internal Medicine

## 2023-10-02 ENCOUNTER — Encounter: Payer: Self-pay | Admitting: Internal Medicine

## 2023-10-02 VITALS — BP 110/76 | HR 78 | Temp 98.2°F | Ht 59.0 in | Wt 137.0 lb

## 2023-10-02 DIAGNOSIS — I10 Essential (primary) hypertension: Secondary | ICD-10-CM | POA: Diagnosis not present

## 2023-10-02 DIAGNOSIS — Z7984 Long term (current) use of oral hypoglycemic drugs: Secondary | ICD-10-CM | POA: Diagnosis not present

## 2023-10-02 DIAGNOSIS — L719 Rosacea, unspecified: Secondary | ICD-10-CM

## 2023-10-02 DIAGNOSIS — Z7985 Long-term (current) use of injectable non-insulin antidiabetic drugs: Secondary | ICD-10-CM | POA: Diagnosis not present

## 2023-10-02 DIAGNOSIS — E1169 Type 2 diabetes mellitus with other specified complication: Secondary | ICD-10-CM | POA: Diagnosis not present

## 2023-10-02 DIAGNOSIS — E119 Type 2 diabetes mellitus without complications: Secondary | ICD-10-CM

## 2023-10-02 DIAGNOSIS — K760 Fatty (change of) liver, not elsewhere classified: Secondary | ICD-10-CM

## 2023-10-02 DIAGNOSIS — F319 Bipolar disorder, unspecified: Secondary | ICD-10-CM

## 2023-10-02 DIAGNOSIS — E785 Hyperlipidemia, unspecified: Secondary | ICD-10-CM

## 2023-10-02 DIAGNOSIS — I152 Hypertension secondary to endocrine disorders: Secondary | ICD-10-CM

## 2023-10-02 LAB — POCT GLYCOSYLATED HEMOGLOBIN (HGB A1C): HbA1c, POC (controlled diabetic range): 7.9 % — AB (ref 0.0–7.0)

## 2023-10-02 LAB — GLUCOSE, POCT (MANUAL RESULT ENTRY): POC Glucose: 137 mg/dL — AB (ref 70–99)

## 2023-10-02 MED ORDER — METRONIDAZOLE 1 % EX GEL: Freq: Every day | CUTANEOUS | 0 refills | Status: DC

## 2023-10-02 MED ORDER — OZEMPIC (0.25 OR 0.5 MG/DOSE) 2 MG/3ML ~~LOC~~ SOPN: 0.5000 mg | PEN_INJECTOR | SUBCUTANEOUS | 2 refills | Status: DC
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
## 2023-10-02 NOTE — Patient Instructions (Signed)
 Increase Ozempic to 0.5 mg once a week.  If you develop any vomiting, abdominal pain, severe diarrhea or constipation, please stop the medication and be seen immediately or give [place] a call.  Okay to take a One-A-Day woman's vitamin.

## 2023-10-03 ENCOUNTER — Encounter: Payer: Self-pay | Admitting: Internal Medicine

## 2023-10-04 LAB — MICROALBUMIN / CREATININE URINE RATIO
Creatinine, Urine: 77.2 mg/dL
Microalb/Creat Ratio: 42 mg/g{creat} — ABNORMAL HIGH (ref 0–29)
Microalbumin, Urine: 32.7 ug/mL

## 2023-10-04 LAB — HEPATIC FUNCTION PANEL
ALT: 31 IU/L (ref 0–32)
AST: 27 IU/L (ref 0–40)
Albumin: 4.9 g/dL (ref 3.9–4.9)
Alkaline Phosphatase: 96 IU/L (ref 44–121)
Bilirubin Total: 0.3 mg/dL (ref 0.0–1.2)
Bilirubin, Direct: 0.12 mg/dL (ref 0.00–0.40)
Total Protein: 7.8 g/dL (ref 6.0–8.5)

## 2023-10-04 LAB — LIPID PANEL
Chol/HDL Ratio: 6.7 ratio — ABNORMAL HIGH (ref 0.0–4.4)
Cholesterol, Total: 222 mg/dL — ABNORMAL HIGH (ref 100–199)
HDL: 33 mg/dL — ABNORMAL LOW (ref >39)
LDL Chol Calc (NIH): 103 mg/dL — ABNORMAL HIGH (ref 0–99)
Triglycerides: 505 mg/dL — ABNORMAL HIGH (ref 0–149)
VLDL Cholesterol Cal: 86 mg/dL — ABNORMAL HIGH (ref 5–40)

## 2023-10-05 ENCOUNTER — Encounter: Payer: Self-pay | Admitting: Internal Medicine

## 2023-11-04 ENCOUNTER — Other Ambulatory Visit: Payer: Self-pay | Admitting: Internal Medicine

## 2023-11-04 DIAGNOSIS — E1169 Type 2 diabetes mellitus with other specified complication: Secondary | ICD-10-CM

## 2023-11-05 NOTE — Telephone Encounter (Signed)
 Rx 10/02/23 3ml 2RF- too soon Requested Prescriptions  Pending Prescriptions Disp Refills   OZEMPIC, 0.25 OR 0.5 MG/DOSE, 2 MG/3ML SOPN [Pharmacy Med Name: Ozempic (0.25 or 0.5 MG/DOSE) Subcutaneous Solution Pen-injector 2 MG/3ML]      Sig: INJECT 0.25MG  UNDER THE SKIN ONE TIME WEEKLY (DISCARD PEN 56 DAYS AFTER OPENING)     Endocrinology:  Diabetes - GLP-1 Receptor Agonists - semaglutide Failed - 11/05/2023 11:59 AM      Failed - HBA1C in normal range and within 180 days    HbA1c, POC (controlled diabetic range)  Date Value Ref Range Status  10/02/2023 7.9 (A) 0.0 - 7.0 % Final         Passed - Cr in normal range and within 360 days    Creat  Date Value Ref Range Status  12/21/2015 0.56 0.50 - 1.05 mg/dL Final   Creatinine, Ser  Date Value Ref Range Status  06/07/2023 0.60 0.44 - 1.00 mg/dL Final    Comment:    POST-ULTRACENTRIFUGATION         Passed - Valid encounter within last 6 months    Recent Outpatient Visits           1 month ago Type 2 diabetes mellitus with other specified complication, without long-term current use of insulin (HCC)   [facility] Dept Of [place]. [facility] Lawrance Presume, MD   5 months ago Tremors of nervous system   [facility] - A Dept Of [place]. [facility] [place], Drexel Gentles, FNP   8 months ago Uncontrolled diabetes mellitus with hyperglycemia, with long-term current use of insulin (HCC)   [facility] - A Dept Of [place]. [facility], Shelvy Dickens M, [place]   10 months ago Diabetes mellitus without complication [facility])   [facility] - A Dept Of [place]. [facility] Freada Jacobs, [place] L, RPH-CPP   10 months ago Uncontrolled diabetes mellitus with hyperglycemia, with long-term current use of insulin (HCC)   [facility] [facility] - A Dept Of . [facility] Lawrance Presume, MD       Future  Appointments             In 3 months Lincoln Renshaw Rexine Cater, MD [facility] - A Dept Of [place] Fragmin. [facility]

## 2023-11-10 LAB — HM DIABETES EYE EXAM

## 2023-11-20 ENCOUNTER — Other Ambulatory Visit: Payer: Self-pay | Admitting: Internal Medicine

## 2023-12-05 ENCOUNTER — Other Ambulatory Visit: Payer: Self-pay | Admitting: Internal Medicine

## 2023-12-05 DIAGNOSIS — E1165 Type 2 diabetes mellitus with hyperglycemia: Secondary | ICD-10-CM

## 2023-12-10 ENCOUNTER — Other Ambulatory Visit: Payer: Self-pay | Admitting: Internal Medicine

## 2023-12-15 NOTE — Progress Notes (Deleted)
 Acute Office Visit  Subjective:     Patient ID: Amy Glass, female    DOB: 09/09/1960, 63 y.o.   MRN: [id number]  No chief complaint on file.   HPI Patient is in today for bladder issues Last OV 09/2023 with Dr Lincoln Renshaw Amy Glass is a 63 y.o. female who presents for chronic ds management. Her concerns today include:  DM, HTN, hyperTG, nonobstructive CAD on Cardiac CT 02/2021, depression/anxiety, bipolar disorder, chronic neck and lower back pain.    Discussed the use of AI scribe software for clinical note transcription with the patient, who gave verbal consent to proceed.   History of Present Illness   The patient, with a history of diabetes, bipolar disorder, and hyperlipidemia, presents for chronic disease management.    DM:      Results for orders placed or performed in visit on 10/02/23  POCT glucose (manual entry)    Collection Time: 10/02/23  2:44 PM  Result Value Ref Range    POC Glucose 137 (A) 70 - 99 mg/dl  POCT glycosylated hemoglobin (Hb A1C)    Collection Time: 10/02/23  2:47 PM  Result Value Ref Range    Hemoglobin A1C        HbA1c POC (<> result, manual entry)        HbA1c, POC (prediabetic range)        HbA1c, POC (controlled diabetic range) 7.9 (A) 0.0 - 7.0 %  -Should be on glargine insulin  22 units daily, Ozempic  0.25 mg once a week and metformin  1 g twice a day.  Self stopped the glargine insulin  after starting Ozempic . -Not getting in much exercise.  Complains of feeling weak and leg pains at times.  Wanting to know if there is a vitamin that she can take to help with this. -She reports a recent strict diet of chicken, vegetables, and salads, which has resulted in a 6-pound weight loss over the past three weeks. However, she also reports feeling weak and having no energy. She has been checking her blood sugars manually three times a day, before and after meals and at night. Her blood sugar levels have improved from the 200s-370s to 130-170  since starting the diet. She is not interested in continuous glucose monitor.   HTN: Should be on Cozaar  100 mg daily and Norvasc .  She reports she has not been taking them consistently because blood pressure has been okay.  She checks blood pressure about once a week.  Last took her medications sometime last week.  Blood pressure today is good.   HL: She was enrolled in a study known as the essence trial which has come to an end.  I see the medication Vascepa  on her med list prescribed by cardiologist Dr. Maximo Spar.  Patient states she has it but has not been taking it consistently.   Hepatic steatosis: Had liver ultrasound done through [facility] 10/25/2022 that revealed hepatomegaly with hepatic steatosis. Elastography/FibroScan showed steatosis grade of S2/314 and fibrosis stage of F4/18.6.  Referred to gastroenterology by a physician assistant August of last year.  Looks like [place] [facility] tried to reach her unsuccessfully.   Chronic facial dermatitis: The patient also reports facial redness that has been present since her 40s and has become constant. She describes it as red, red, red, and uses makeup to cover it. She reports occasional itching and the presence of small bumps. She has not noticed any triggers for the redness.   Bipolar disorder: She is plugged in  with a psychiatrist and a therapist and sees them regularly.  Reports that she was taken off Adderall and Vraylar because neurologist felt they may have been contributing to tremors in her hands.  She remains on Pristiq   She continues to see pain management through [facility] Type 2 diabetes mellitus with other specified complication, without long-term current use of insulin  (HCC) (Primary) -A1c is not at goal but improved from when it was last checked in June when it was around 10. Patient self stopped glargine insulin .  Given that A1c has improved without it, we will keep her off of it.  We discussed increasing  the Ozempic  instead to 0.5 mg once a week.  Advised to watch out for potential side effects like vomiting, abdominal pain, severe diarrhea or constipation.  If any of this occurs, she should stop the medicine and get in right away.  Continue metformin  1 g twice a day. -Encouraged her to continue healthy eating habits. Advised that it is okay for her to take a multivitamin One-A-Day woman's tablet. - POCT glycosylated hemoglobin (Hb A1C) - POCT glucose (manual entry) - Semaglutide ,0.25 or 0.5MG /DOS, (OZEMPIC , 0.25 OR 0.5 MG/DOSE,) 2 MG/3ML SOPN; Inject 0.5 mg into the skin once a week. INJECT MG UNDER THE SKIN ONE TIME WEEKLY  Dispense: 3 mL; Refill: 2 - Microalbumin / creatinine urine ratio   2. Long-term (current) use of injectable non-insulin  antidiabetic drugs 3. Diabetes mellitus treated with oral medication (HCC) See #1 above.   4. Hyperlipidemia associated with type 2 diabetes mellitus (HCC) Encouraged her to take the Vascepa  as prescribed by Dr. Maximo Spar. - Lipid panel - Hepatic function panel   5. Hypertension associated with type 2 diabetes mellitus (HCC) Blood pressure at goal.  She has not been taking Cozaar  and Norvasc  consistently and reports that the last time she took medication was sometime last week.  Advised her to hold off on taking the medications but continue to monitor blood pressure at least twice a week.  If blood pressure starts to increase above 130/80, she should restart the Cozaar .   6. Bipolar I disorder with depression (HCC) Plugged in with behavioral health.  Reports she is doing okay on the Pristiq.   7. Rosacea Looks like this may be rosacea.  Will start her on MetroGel  and refer to dermatology. - Ambulatory referral to Dermatology - metroNIDAZOLE  (METROGEL ) 1 % gel; Apply topically daily.  Dispense: 45 g; Refill: 0   8. Hepatic steatosis Recheck liver function.  Will resubmit referral to GI. - Hepatic function panel     ROS      Objective:    LMP  05/30/2015  {Vitals History (Optional):23777}  Physical Exam  No results found for any visits on 12/18/23.      Assessment & Plan:   Problem List Items Addressed This Visit   None   No orders of the defined types were placed in this encounter.   No follow-ups on file.  Arlene Lacy, MD

## 2023-12-18 ENCOUNTER — Ambulatory Visit: Admitting: Critical Care Medicine

## 2023-12-22 ENCOUNTER — Encounter: Payer: Self-pay | Admitting: Internal Medicine

## 2023-12-30 ENCOUNTER — Encounter: Payer: Self-pay | Admitting: Nurse Practitioner

## 2023-12-30 ENCOUNTER — Ambulatory Visit: Attending: Nurse Practitioner | Admitting: Nurse Practitioner

## 2023-12-30 VITALS — BP 131/82 | HR 83 | Resp 19 | Ht 59.0 in | Wt 140.0 lb

## 2023-12-30 DIAGNOSIS — R3589 Other polyuria: Secondary | ICD-10-CM

## 2023-12-30 LAB — POCT URINALYSIS DIP (CLINITEK)
Bilirubin, UA: NEGATIVE
Blood, UA: NEGATIVE
Glucose, UA: NEGATIVE mg/dL
Ketones, POC UA: NEGATIVE mg/dL
Nitrite, UA: NEGATIVE
POC PROTEIN,UA: NEGATIVE
Spec Grav, UA: 1.01 (ref 1.010–1.025)
Urobilinogen, UA: 0.2 U/dL
pH, UA: 5.5 (ref 5.0–8.0)

## 2023-12-30 NOTE — Progress Notes (Signed)
 Assessment & Plan:   Amy Glass was seen today for polyuria.  Diagnoses and all orders for this visit:  Polyuria -     POCT URINALYSIS DIP (CLINITEK) -     Urinalysis, Complete -     Urine Culture    Patient has been counseled on age-appropriate routine health concerns for screening and prevention. These are reviewed and up-to-date. Referrals have been placed accordingly. Immunizations are up-to-date or declined.    Subjective:   Chief Complaint  Patient presents with   Polyuria    Amy Glass 63 y.o. female presents to office today for urinary frequency  She is a patient of Dr. Lincoln Renshaw.   Notes urinary frequency which has been occurring over the past 4 months. Constantly feels like she has to urinate however when she does urinate it is only small amounts.  She does have a history of uterine fibroids from a previous Pelvic CT 9 years ago. She currently denies pelvic pressure/pain, flank pain, N/V.     Review of Systems  Constitutional:  Negative for fever, malaise/fatigue and weight loss.  HENT: Negative.  Negative for nosebleeds.   Eyes: Negative.  Negative for blurred vision, double vision and photophobia.  Respiratory: Negative.  Negative for cough and shortness of breath.   Cardiovascular: Negative.  Negative for chest pain, palpitations and leg swelling.  Gastrointestinal: Negative.  Negative for heartburn, nausea and vomiting.  Genitourinary:  Positive for frequency and urgency. Negative for dysuria, flank pain and hematuria.  Musculoskeletal: Negative.  Negative for myalgias.  Neurological: Negative.  Negative for dizziness, focal weakness, seizures and headaches.  Psychiatric/Behavioral: Negative.  Negative for suicidal ideas.     Past Medical History:  Diagnosis Date   ADHD (attention deficit hyperactivity disorder)    Allergy    Anxiety    as child    COVID 2020   mild   Depression    as child   Diabetes mellitus without complication (HCC) Dx  2003   Diabetes mellitus, type II (HCC)    Diverticulosis    GERD (gastroesophageal reflux disease)    Hiatal hernia    Hyperlipidemia    Hyperplastic colon polyp    Hypertension Dx 2003   Schatzki's ring     Past Surgical History:  Procedure Laterality Date   BACK SURGERY  2002   lumbar   BREAST BIOPSY Left 05/11/2018   NON-CASEATING GRANULOMATOUS INFLAMMATION   BROW LIFT Bilateral 09/17/2016   Procedure: BLEPHAROPLASTY;  Surgeon: Alger Infield, MD;  Location: [facility];  Service: Plastics;  Laterality: Bilateral;   CARPAL TUNNEL RELEASE Right    CARPAL TUNNEL RELEASE Left 06/2016   CESAREAN SECTION  05/20/2003   COLONOSCOPY     ESOPHAGEAL MANOMETRY N/A 10/25/2015   Procedure: ESOPHAGEAL MANOMETRY (EM);  Surgeon: Nannette Babe, MD;  Location: [facility];  Service: Gastroenterology;  Laterality: N/A;   POLYPECTOMY     PTOSIS REPAIR Bilateral 09/17/2016   Procedure: BILATERAL PTOSIS REPAIR EYELID WITH SUTURE TECHNIQUE, BILATERAL UPPER LID BLEPHAROPLASTY WITH EXCESS SKIN WEIGHING EYELID DOWN.;  Surgeon: Alger Infield, MD;  Location: [facility];  Service: Plastics;  Laterality: Bilateral;   SHOULDER SURGERY  08/2013   b/l shoulder    SHOULDER SURGERY Right 11/2015   TMJ ARTHROPLASTY Bilateral 01/21/2022   Procedure: TEMPOROMANDIBULAR JOINT (TMJ) ARTHROTOMY, MINISECTOMY WITH ABDOMINAL FAT GRAFT;  Surgeon: Ascencion Lava, DMD;  Location: [facility];  Service: Oral Surgery;  Laterality: Bilateral;   UPPER GASTROINTESTINAL ENDOSCOPY  Family History  Problem Relation Age of Onset   Diabetes Mother    Heart disease Mother    Hyperlipidemia Mother    Renal cancer Maternal Aunt    Stomach cancer Maternal Uncle    Diabetes Maternal Grandmother    Hyperlipidemia Maternal Grandmother    Healthy Daughter    Healthy Daughter    Healthy Daughter    Colon cancer Neg Hx    Esophageal cancer Neg Hx    Rectal cancer Neg Hx    Breast cancer Neg Hx     Colon polyps Neg Hx     Social History Reviewed with no changes to be made today.   Outpatient Medications Prior to Visit  Medication Sig Dispense Refill   desvenlafaxine (PRISTIQ) 50 MG 24 hr tablet Take 50 mg by mouth daily.     HYDROcodone -acetaminophen  (NORCO) 10-325 MG tablet Take 1 tablet by mouth 2 (two) times daily as needed.     Insulin  Pen Needle (PEN NEEDLES) 32G X 4 MM MISC Use to inject insulin  once daily. 100 each 1   losartan  (COZAAR ) 100 MG tablet TAKE 1 TABLET EVERY DAY 90 tablet 1   metFORMIN  (GLUCOPHAGE -XR) 500 MG 24 hr tablet TAKE 2 TABLETS TWICE DAILY WITH A MEAL 360 tablet 3   Semaglutide ,0.25 or 0.5MG /DOS, (OZEMPIC , 0.25 OR 0.5 MG/DOSE,) 2 MG/3ML SOPN Inject 0.5 mg into the skin once a week. INJECT MG UNDER THE SKIN ONE TIME WEEKLY 3 mL 2   traZODone  (DESYREL ) 100 MG tablet Take 1 tablet (100 mg total) by mouth at bedtime as needed for sleep. 90 tablet 2   ACCU-CHEK GUIDE test strip USE AS DIRECTED TO TEST BLOOD SUGAR ONCE DAILY (Patient not taking: Reported on 12/30/2023) 100 strip 3   Accu-Chek Softclix Lancets lancets Use to check blood sugar TID. E11.65 (Patient not taking: Reported on 12/30/2023) 100 each 3   aspirin EC 81 MG tablet Take 81 mg by mouth daily. Swallow whole. (Patient not taking: Reported on 12/30/2023)     Blood Glucose Monitoring Suppl (ACCU-CHEK GUIDE) w/Device KIT Use to check blood sugar TID. E11.65 (Patient not taking: Reported on 12/30/2023) 1 kit 0   Continuous Blood Gluc Receiver (FREESTYLE LIBRE 3 READER) DEVI 1 each by Does not apply route daily. (Patient not taking: Reported on 12/30/2023) 1 each 0   icosapent  Ethyl (VASCEPA ) 1 g capsule Take 2 capsules (2 g total) by mouth 2 (two) times daily. (Patient not taking: Reported on 12/30/2023) 120 capsule 0   metroNIDAZOLE  (METROGEL ) 1 % gel Apply topically daily. (Patient not taking: Reported on 12/30/2023) 45 g 0   No facility-administered medications prior to visit.    Allergies  Allergen  Reactions   Nitroglycerin  Other (See Comments)    Migraines, (only tried nitro patch. Has never tried the pills)   Tricor  [Fenofibrate ]     Stomach upset       Objective:    BP 131/82 (BP Location: Left Arm, Patient Position: Sitting, Cuff Size: Normal)   Pulse 83   Resp 19   Wt 140 lb (63.5 kg)   LMP 05/30/2015   SpO2 98%   BMI 28.28 kg/m  Wt Readings from Last 3 Encounters:  12/30/23 140 lb (63.5 kg)  10/02/23 137 lb (62.1 kg)  09/03/23 141 lb 9.6 oz (64.2 kg)    Physical Exam Vitals and nursing note reviewed.  Constitutional:      Appearance: She is well-developed.  HENT:     Head: Normocephalic and atraumatic.  Cardiovascular:     Rate and Rhythm: Normal rate and regular rhythm.     Heart sounds: Normal heart sounds. No murmur heard.    No friction rub. No gallop.  Pulmonary:     Effort: Pulmonary effort is normal. No tachypnea or respiratory distress.     Breath sounds: Normal breath sounds. No decreased breath sounds, wheezing, rhonchi or rales.  Chest:     Chest wall: No tenderness.  Abdominal:     General: Bowel sounds are normal.     Palpations: Abdomen is soft.  Musculoskeletal:        General: Normal range of motion.     Cervical back: Normal range of motion.  Skin:    General: Skin is warm and dry.  Neurological:     Mental Status: She is alert and oriented to person, place, and time.     Coordination: Coordination normal.  Psychiatric:        Behavior: Behavior normal. Behavior is cooperative.        Thought Content: Thought content normal.        Judgment: Judgment normal.          Patient has been counseled extensively about nutrition and exercise as well as the importance of adherence with medications and regular follow-up. The patient was given clear instructions to go to ER or return to medical center if symptoms don't improve, worsen or new problems develop. The patient verbalized understanding.   Follow-up: Return if symptoms worsen or  fail to improve.   Collins Dean, FNP-BC [facility] [place], [place] [phone]   12/30/2023, 2:59 PM

## 2023-12-31 LAB — MICROSCOPIC EXAMINATION
Bacteria, UA: NONE SEEN
Casts: NONE SEEN /LPF
RBC, Urine: NONE SEEN /HPF (ref 0–2)

## 2023-12-31 LAB — URINALYSIS, COMPLETE
Bilirubin, UA: NEGATIVE
Glucose, UA: NEGATIVE
Ketones, UA: NEGATIVE
Nitrite, UA: NEGATIVE
Protein,UA: NEGATIVE
RBC, UA: NEGATIVE
Specific Gravity, UA: 1.012 (ref 1.005–1.030)
Urobilinogen, Ur: 0.2 mg/dL (ref 0.2–1.0)
pH, UA: 6 (ref 5.0–7.5)

## 2024-01-01 LAB — URINE CULTURE

## 2024-01-03 ENCOUNTER — Other Ambulatory Visit: Payer: Self-pay | Admitting: Internal Medicine

## 2024-01-03 DIAGNOSIS — E1169 Type 2 diabetes mellitus with other specified complication: Secondary | ICD-10-CM

## 2024-01-05 ENCOUNTER — Ambulatory Visit: Payer: Self-pay | Admitting: Nurse Practitioner

## 2024-01-06 NOTE — Telephone Encounter (Signed)
 Copied from CRM [id number]. Topic: Clinical - Lab/Test Results >> Jan 06, 2024  2:31 PM Bridgette Campus T wrote:  Reason for CRM: returning call to office, already was informed no UTI, states there was a call this morning- please call [phone]- still has the frequency no pain or blood, not much coming out

## 2024-01-07 ENCOUNTER — Encounter: Payer: Self-pay | Admitting: Internal Medicine

## 2024-01-09 ENCOUNTER — Other Ambulatory Visit: Payer: Self-pay | Admitting: Internal Medicine

## 2024-01-09 DIAGNOSIS — R350 Frequency of micturition: Secondary | ICD-10-CM

## 2024-02-05 ENCOUNTER — Encounter: Payer: Self-pay | Admitting: Internal Medicine

## 2024-02-05 ENCOUNTER — Ambulatory Visit: Attending: Internal Medicine | Admitting: Internal Medicine

## 2024-02-05 VITALS — BP 156/98 | HR 83 | Temp 98.5°F | Ht 59.0 in | Wt 123.0 lb

## 2024-02-05 DIAGNOSIS — E785 Hyperlipidemia, unspecified: Secondary | ICD-10-CM | POA: Diagnosis not present

## 2024-02-05 DIAGNOSIS — K760 Fatty (change of) liver, not elsewhere classified: Secondary | ICD-10-CM

## 2024-02-05 DIAGNOSIS — Z7984 Long term (current) use of oral hypoglycemic drugs: Secondary | ICD-10-CM

## 2024-02-05 DIAGNOSIS — E1159 Type 2 diabetes mellitus with other circulatory complications: Secondary | ICD-10-CM

## 2024-02-05 DIAGNOSIS — R350 Frequency of micturition: Secondary | ICD-10-CM

## 2024-02-05 DIAGNOSIS — Z7985 Long-term (current) use of injectable non-insulin antidiabetic drugs: Secondary | ICD-10-CM | POA: Diagnosis not present

## 2024-02-05 DIAGNOSIS — E119 Type 2 diabetes mellitus without complications: Secondary | ICD-10-CM

## 2024-02-05 DIAGNOSIS — I10 Essential (primary) hypertension: Secondary | ICD-10-CM | POA: Diagnosis not present

## 2024-02-05 DIAGNOSIS — E1169 Type 2 diabetes mellitus with other specified complication: Secondary | ICD-10-CM

## 2024-02-05 LAB — POCT GLYCOSYLATED HEMOGLOBIN (HGB A1C): HbA1c, POC (controlled diabetic range): 6.3 % (ref 0.0–7.0)

## 2024-02-05 LAB — GLUCOSE, POCT (MANUAL RESULT ENTRY): POC Glucose: 129 mg/dL — AB (ref 70–99)

## 2024-02-05 MED ORDER — LOSARTAN POTASSIUM 50 MG PO TABS: 50.0000 mg | ORAL_TABLET | Freq: Every day | ORAL | 1 refills | Status: DC

## 2024-02-05 NOTE — Patient Instructions (Addendum)
 Placed in [facility] [address] [place], [place] [address] PH# [phone]   VISIT SUMMARY:  Today, we reviewed your diabetes, hypertension, liver health, cholesterol levels, and urinary symptoms. Your blood sugar control has improved significantly, and you have lost weight. However, your blood pressure is high, and you need to restart your medication. We also discussed your liver condition, high triglycerides, and urinary frequency, and made plans for further evaluation and management.  YOUR PLAN:  -TYPE 2 DIABETES MELLITUS: Type 2 diabetes is a condition where your body does not use insulin  properly, leading to high blood sugar levels. Your A1c has improved to 6.3%, and you have lost weight. Continue taking Ozempic  0.5 mg once weekly and Metformin  500 mg, two tablets in the morning and two in the evening. Keep monitoring your blood sugar regularly and include protein with each meal to avoid skipping meals.  -HYPERTENSION: Hypertension is high blood pressure, which can lead to serious health problems if not managed. Your blood pressure is high today at 156/98 mmHg. Restart taking Cozaar  (losartan ) 100 mg daily, monitor your blood pressure at least twice a week, and limit your salt intake.  -METABOLIC ASSOCIATED STEATOTIC LIVER DISEASE (MASLD): MASLD is a condition where fat builds up in the liver, causing inflammation. Your weight loss and Ozempic  may help improve this condition. It is important to follow up with a gastroenterologist for further evaluation. We will provide you with the contact information for a gastroenterologist.  -HYPERTRIGLYCERIDEMIA: Hypertriglyceridemia is having high levels of triglycerides in your blood, which can increase the risk of heart disease. You need to restart Vascepa , but you do not have a current prescription. We will refer you to a lipid clinic and coordinate with Dr. Mona to manage your triglycerides and get a new prescription for Vascepa .  -URINARY FREQUENCY: You  have been experiencing a frequent need to urinate with low volume output for six months. This could be due to a urological issue. We will refer you to a urologist for further evaluation and offer a urine culture if you can provide a sample.  INSTRUCTIONS:  Please follow up with a gastroenterologist for your liver condition and with a lipid clinic for your high triglycerides. Restart taking Cozaar  (losartan ) 100 mg daily for your blood pressure and monitor it at least twice a week. Limit your salt intake. We will provide you with the contact information for the gastroenterologist and lipid clinic. Additionally, follow up with a urologist for your urinary symptoms and provide a urine sample for culture if possible.
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
## 2024-02-07 ENCOUNTER — Ambulatory Visit: Payer: Self-pay | Admitting: Internal Medicine

## 2024-02-07 LAB — URINE CULTURE

## 2024-03-03 ENCOUNTER — Encounter (HOSPITAL_BASED_OUTPATIENT_CLINIC_OR_DEPARTMENT_OTHER): Admitting: Nurse Practitioner

## 2024-03-03 NOTE — Progress Notes (Deleted)
 Ellouise Console, PA-C [address] [place], [place]  [address] Phone: [phone]   Primary Care Physician: [name], MD  Primary Gastroenterologist:  Ellouise Console, PA-C / Dr. Gordy Starch   Chief Complaint:  Hepatic Steatosis, Autoimune Hepatitis       HPI:   Amy Glass is a 63 y.o. female is referred for hepatic steatosis and AIH.  In 2024 patient saw Dr. Tamela with [facility] to evaluate transient elevated LFTs,.  Last labs 09/2023 showed LFTs had all returned to normal.  Patient had a FibroScan of the liver done at [name] 12/2022 which showed steatosis grade S2 and fibrosis score F4.  Dx with MASLD.  10/2022 complete abdominal ultrasound through [facility]: Hepatomegaly (18.8 cm), hepatic steatosis, a single gallstone.  PMH: GERD with small hiatal hernia, nonadvanced adenomatous colon polyp, and diverticulosis.  Diabetes, HTN, hypertriglyceridemia, BiPolar, anxiety and depression.  Last saw Dr. Starch in our office 10/2021 for f/u GERD controlled on omeprazole  40mg  daily.  05/2021 last colonoscopy by Dr. Starch: 4 polyps (4 mm to 7 mm) removed.  1 tubular adenoma, 2 hyperplastic, and 1 colon mucosal polyp.  Pandiverticulosis.  Good prep.  7-year repeat (05/2028).  06/2016 last EGD: 2 cm hiatal hernia, otherwise normal EGD.  Empiric esophageal dilation to 18 mm.  No biopsies.  Component Ref Range & Units (hover) 5 mo ago (10/02/23) 9 mo ago (06/07/23) 1 yr ago (12/30/22) 3 yr ago (04/28/20) 4 yr ago (09/21/19) 5 yr ago (02/12/19) 5 yr ago (02/12/19)  Total Protein 7.8 7.7 R, CM 7.7 7.3 8.0 R 7.8 R 8.8 High  R  Albumin 4.9 4.2 R, CM 4.8 4.5 R 4.2 R 4.4 R 5.2 High  R  Bilirubin Total 0.3 0.7 R, CM 0.3 0.3 0.6 R 0.7 R 1.0 R  Bilirubin, Direct 0.12  0.13      Alkaline Phosphatase 96 149 High  R, CM 204 High  78 CM 120 R 68 R 79 R  AST 27 44 High  R, CM 87 High  20 28 R 31 R 36 R  ALT 31 44 R, CM 126 High  21 39 R 33     Current  Outpatient Medications  Medication Sig Dispense Refill   ACCU-CHEK GUIDE test strip USE AS DIRECTED TO TEST BLOOD SUGAR ONCE DAILY 100 strip 3   Accu-Chek Softclix Lancets lancets Use to check blood sugar TID. E11.65 100 each 3   aspirin EC 81 MG tablet Take 81 mg by mouth daily. Swallow whole. (Patient not taking: Reported on 02/05/2024)     Blood Glucose Monitoring Suppl (ACCU-CHEK GUIDE) w/Device KIT Use to check blood sugar TID. E11.65 1 kit 0   Continuous Blood Gluc Receiver (FREESTYLE LIBRE 3 READER) DEVI 1 each by Does not apply route daily. 1 each 0   desvenlafaxine (PRISTIQ) 50 MG 24 hr tablet Take 50 mg by mouth daily.     HYDROcodone -acetaminophen  (NORCO) 10-325 MG tablet Take 1 tablet by mouth 2 (two) times daily as needed.     icosapent  Ethyl (VASCEPA ) 1 g capsule Take 2 capsules (2 g total) by mouth 2 (two) times daily. (Patient not taking: Reported on 02/05/2024) 120 capsule 0   Insulin  Pen Needle (PEN NEEDLES) 32G X 4 MM MISC Use to inject insulin  once daily. 100 each 1   losartan  (COZAAR ) 50 MG tablet Take 1 tablet (50 mg total) by mouth daily. 90 tablet 1  metFORMIN  (GLUCOPHAGE -XR) 500 MG 24 hr tablet TAKE 2 TABLETS TWICE DAILY WITH A MEAL 360 tablet 3   Semaglutide ,0.25 or 0.5MG /DOS, (OZEMPIC , 0.25 OR 0.5 MG/DOSE,) 2 MG/3ML SOPN Inject 0.5 mg into the skin once a week. 3 mL 2   traZODone  (DESYREL ) 100 MG tablet Take 1 tablet (100 mg total) by mouth at bedtime as needed for sleep. 90 tablet 2   No current facility-administered medications for this visit.    Allergies as of 03/04/2024 - Review Complete 02/05/2024  Allergen Reaction Noted   Nitroglycerin  Other (See Comments) 10/01/2013   Tricor  [fenofibrate ]  02/27/2018    Past Medical History:  Diagnosis Date   ADHD (attention deficit hyperactivity disorder)    Allergy    Anxiety    as child    COVID 2020   mild   Depression    as child   Diabetes mellitus without complication (HCC) Dx 2003   Diabetes mellitus,  type II (HCC)    Diverticulosis    GERD (gastroesophageal reflux disease)    Hiatal hernia    Hyperlipidemia    Hyperplastic colon polyp    Hypertension Dx 2003   Schatzki's ring     Past Surgical History:  Procedure Laterality Date   BACK SURGERY  2002   lumbar   BREAST BIOPSY Left 05/11/2018   NON-CASEATING GRANULOMATOUS INFLAMMATION   BROW LIFT Bilateral 09/17/2016   Procedure: BLEPHAROPLASTY;  Surgeon: Earlis Ranks, MD;  Location: [facility];  Service: Plastics;  Laterality: Bilateral;   CARPAL TUNNEL RELEASE Right    CARPAL TUNNEL RELEASE Left 06/2016   CESAREAN SECTION  05/20/2003   COLONOSCOPY     ESOPHAGEAL MANOMETRY N/A 10/25/2015   Procedure: ESOPHAGEAL MANOMETRY (EM);  Surgeon: Gordy CHRISTELLA Starch, MD;  Location: [facility];  Service: Gastroenterology;  Laterality: N/A;   POLYPECTOMY     PTOSIS REPAIR Bilateral 09/17/2016   Procedure: BILATERAL PTOSIS REPAIR EYELID WITH SUTURE TECHNIQUE, BILATERAL UPPER LID BLEPHAROPLASTY WITH EXCESS SKIN WEIGHING EYELID DOWN.;  Surgeon: Earlis Ranks, MD;  Location: [facility];  Service: Plastics;  Laterality: Bilateral;   SHOULDER SURGERY  08/2013   b/l shoulder    SHOULDER SURGERY Right 11/2015   TMJ ARTHROPLASTY Bilateral 01/21/2022   Procedure: TEMPOROMANDIBULAR JOINT (TMJ) ARTHROTOMY, MINISECTOMY WITH ABDOMINAL FAT GRAFT;  Surgeon: Sheryle Hamilton, DMD;  Location: [facility];  Service: Oral Surgery;  Laterality: Bilateral;   UPPER GASTROINTESTINAL ENDOSCOPY      Review of Systems:    All systems reviewed and negative except where noted in HPI.    Physical Exam:  LMP 05/30/2015  Patient's last menstrual period was 05/30/2015.  General: Well-nourished, well-developed in no acute distress.  Lungs: Clear to auscultation bilaterally. Non-labored. Heart: Regular rate and rhythm, no murmurs rubs or gallops.  Abdomen: Bowel sounds are normal; Abdomen is Soft; No hepatosplenomegaly, masses or hernias;  No  Abdominal Tenderness; No guarding or rebound tenderness. Neuro: Alert and oriented x 3.  Grossly intact.  Psych: Alert and cooperative, normal mood and affect.   Imaging Studies: No results found.  Labs: CBC    Component Value Date/Time   WBC 6.4 06/07/2023 1212   RBC 4.92 06/07/2023 1212   HGB 14.7 06/07/2023 1212   HGB 12.4 01/24/2021 0957   HCT 41.5 06/07/2023 1212   HCT 39.3 01/24/2021 0957   PLT 286 06/07/2023 1212   PLT 268 01/24/2021 0957   MCV 84.3 06/07/2023 1212   MCV 87 01/24/2021 0957   MCH 29.9  06/07/2023 1212   MCHC 35.4 06/07/2023 1212   RDW 12.9 06/07/2023 1212   RDW 13.3 01/24/2021 0957   LYMPHSABS 2.6 06/07/2023 1212   MONOABS 0.4 06/07/2023 1212   EOSABS 0.2 06/07/2023 1212   BASOSABS 0.1 06/07/2023 1212    CMP     Component Value Date/Time   NA 135 06/07/2023 1353   NA 138 02/26/2021 1030   K 4.4 06/07/2023 1353   CL 103 06/07/2023 1353   CO2 19 (L) 06/07/2023 1353   GLUCOSE 224 (H) 06/07/2023 1353   BUN 7 (L) 06/07/2023 1353   BUN 12 02/26/2021 1030   CREATININE 0.60 06/07/2023 1353   CREATININE 0.56 12/21/2015 1725   CALCIUM  9.4 06/07/2023 1353   PROT 7.8 10/02/2023 1530   ALBUMIN 4.9 10/02/2023 1530   AST 27 10/02/2023 1530   ALT 31 10/02/2023 1530   ALKPHOS 96 10/02/2023 1530   BILITOT 0.3 10/02/2023 1530   GFRNONAA >60 06/07/2023 1353   GFRNONAA >89 12/21/2015 1725   GFRAA 109 04/28/2020 1033   GFRAA >89 12/21/2015 1725       Assessment and Plan:   Amy Glass is a 63 y.o. y/o female   1.  Hepatic steatosis / MASLD 2.  Hepatomegaly 3.  Transient elevated LFTs which returned to normal  Plan: - Labs: CBC, CMP, PT/INR - Labs: ANA, AMA, ASMA, ceruloplasmin, viral hepatitis A/B/C, iron panel, ferritin, immunoglobulins, alpha-1 antitrypsin  - Fib-4 - RUQ US  Liver Elastography - Recommend a low-fat diet, regular exercise, and weight loss. Patient education handout about fatty liver disease was given and discussed  from up-to-date.     Ellouise Console, PA-C  Follow up 6 months

## 2024-03-04 ENCOUNTER — Ambulatory Visit: Admitting: Pharmacist

## 2024-03-04 ENCOUNTER — Ambulatory Visit: Payer: Self-pay

## 2024-03-04 ENCOUNTER — Ambulatory Visit: Admitting: Physician Assistant

## 2024-03-04 NOTE — Telephone Encounter (Signed)
 FYI Only or Action Required?: Action required by provider: request for appointment.  Patient was last seen in primary care on 02/05/2024 by [name], MD.  Called Nurse Triage reporting Follow-up.   Triage Disposition: Call PCP Within 24 Hours  Patient/caregiver understands and will follow disposition?: Yes      Copied from CRM [id number]. Topic: Appointments - Scheduling Inquiry for Clinic >> Mar 04, 2024 10:06 AM [name] wrote: Reason for CRM: Patient requesting to reschedule appointment for today with Amy Glass, patient is not feeling well.   Patient requesting call back to reschedule, [phone] Reason for Disposition  [1] Follow-up call from patient regarding patient's clinical status AND [2] information NON-URGENT  Additional Information  Commented on: Answer Assessment    Pt initially calling to reschedule today's F/U appt with PCP d/t feeling depressed today/crying frequently today. Triager confirmed that this is a chronic issue and pt endorses having a therapist and on medication management.  Upon further investigation, today's appt was with [facility] pharmacist to review BP medications. Pt was under the impression that it was with PCP as [facility]   Triager will forward encounter for Dr Vicci 's office to review and advise/give clarification. Patient verbalized understanding and is expecting call back from office for next steps.  Answer Assessment - Initial Assessment Questions 1. CONCERN: What happened that made you call today?     Calling to reschedule appt 2. DEPRESSION SYMPTOM SCREENING: How are you feeling overall? (e.g., decreased energy, increased sleeping or difficulty sleeping, difficulty concentrating, feelings of sadness, guilt, hopelessness, or worthlessness)     Endorses has been crying all morning 3. RISK OF HARM - SUICIDAL IDEATION:  Do you ever have thoughts of hurting or killing yourself?  (e.g., yes, no, no but preoccupation with  thoughts about death)     denies 4. RISK OF HARM - HOMICIDAL IDEATION:  Do you ever have thoughts of hurting or killing someone else?  (e.g., yes, no, no but preoccupation with thoughts about death)     denies 5. FUNCTIONAL IMPAIRMENT: How have things been going for you overall? Have you had more difficulty than usual doing your normal daily activities?  (e.g., better, same, worse; self-care, school, work, interactions)     Worse today 6. SUPPORT: Who is with you now? Who do you live with? Do you have family or friends who you can talk to?      Yes has support 7. THERAPIST: Do you have a counselor or therapist? If Yes, ask: What is their name?     Endorses seeing a therapist, Tawni Evern Spiro also being on depression medication - has not missed any medication 8. STRESSORS: Has there been any new stress or recent changes in your life?     unknown 9. ALCOHOL  USE OR SUBSTANCE USE (DRUG USE): Do you drink alcohol  or use any illegal drugs?     N/a 10. OTHER: Do you have any other physical symptoms right now? (e.g., fever)       N/a 11. PREGNANCY: Is there any chance you are pregnant? When was your last menstrual period?       N/a  Protocols used: Depression-A-AH, PCP Call - No Triage-A-AH

## 2024-03-19 ENCOUNTER — Telehealth: Payer: Self-pay | Admitting: Internal Medicine

## 2024-03-19 MED ORDER — AMLODIPINE BESYLATE 5 MG PO TABS: 5.0000 mg | ORAL_TABLET | Freq: Every day | ORAL | 1 refills | Status: DC

## 2024-03-19 NOTE — Addendum Note (Signed)
 Addended by: VICCI SOBER B on: 03/19/2024 10:59 PM   Modules accepted: Orders

## 2024-03-19 NOTE — Telephone Encounter (Signed)
 Copied from CRM #[id number]. Topic: Clinical - Medication Question >> Mar 19, 2024 11:12 AM Amy Glass wrote:  Aripiprazole  seems to be making pt blood pressure too high and pt would like to know if she should double her bp medication. Pt does not know what her current blood pressure is and does not have a machine at home. But it was  148/91 yesterday at her Otolaryngology appointment

## 2024-03-19 NOTE — Telephone Encounter (Signed)
 Message sent to pt via My chart

## 2024-03-23 ENCOUNTER — Encounter: Payer: Self-pay | Admitting: *Deleted

## 2024-03-23 NOTE — Research (Signed)
 Message left for Amy Glass to inform her that she was receiving drug during the Essence research study. Thanked her for being in the study, and encouraged her to call if she needs anything. She has an appointment to see Rosaline Bane 04-08-2024. Reminded her of this. Voices understanding.

## 2024-03-26 ENCOUNTER — Other Ambulatory Visit: Payer: Self-pay | Admitting: Internal Medicine

## 2024-03-26 NOTE — Telephone Encounter (Unsigned)
 Copied from CRM #[id number]. Topic: Clinical - Medication Refill >> Mar 26, 2024 11:03 AM Amy B wrote: Medication: Ozempic   Has the patient contacted their pharmacy? Yes (Agent: If no, request that the patient contact the pharmacy for the refill. If patient does not wish to contact the pharmacy document the reason why and proceed with request.) (Agent: If yes, when and what did the pharmacy advise?)  This is the patient's preferred pharmacy:  [id number] [place], [place] - [address] [place] [address] Phone: [phone] Fax: [phone]  Is this the correct pharmacy for this prescription? Yes If no, delete pharmacy and type the correct one.   Has the prescription been filled recently? No  Is the patient out of the medication? Yes  Has the patient been seen for an appointment in the last year OR does the patient have an upcoming appointment? Yes  Can we respond through MyChart? Yes  Agent: Please be advised that Rx refills may take up to 3 business days. We ask that you follow-up with your pharmacy.

## 2024-04-08 ENCOUNTER — Encounter (HOSPITAL_BASED_OUTPATIENT_CLINIC_OR_DEPARTMENT_OTHER): Admitting: Nurse Practitioner

## 2024-04-08 NOTE — Progress Notes (Deleted)
  Cardiology Office Note   Date:  04/08/2024  ID:  Amy Glass, DOB 1960/10/14, MRN [id number] PCP: [name], MD  [facility] Providers Cardiologist:  Vinie JAYSON Maxcy, MD { Click to update primary MD,subspecialty MD or APP then REFRESH:1}    PMH Type 2 diabetes Hypertension Bipolar type 1 disorder Depression Dyslipidemia Coronary artery disease CCTA 03/05/2021 CAC score 53.6 (91st percentile) Mild mixed density plaque 25 to 49% in proximal LAD Minimal mixed density plaque < 25% in RCA  Referred to [facility] Lipid Disorders clinic and seen by Dr. Maxcy 05/2018.  History of high triglycerides.  This lady whose family is from [place] has had triglycerides for many years, fatty liver disease, and elevated liver enzymes.  She also has history of diabetes and hypertension.  She reported history on gemfibrozil  due to GI upset on fenofibrate .  She has difficulty swallowing the gemfibrozil  tablets due to their size.  History of esophageal dilation in the past.  She was following a low-fat diet and aiming to exercise regularly.  She was started on Vascepa  but was only taking 1 g twice daily.  She reported chest pain and had CCTA on 03/05/2021 which showed mild nonobstructive CAD.  Last clinic visit was 04/29/2022 with Amy Beauvais, NP.  She reported significant dietary changes, regular exercise, and part-time work at [employer].  She continued to avoid fatty foods. No concerning cardiac symptoms.  She was notified on 03/23/2024 that she was in the essence research study and was receiving the study drug.  History of Present Illness Amy Glass is a 63 y.o. female ***  ROS: ***  Studies Reviewed      ***  No results found for: LIPOA  Risk Assessment/Calculations {Does this patient have ATRIAL FIBRILLATION?:[phone]} No BP recorded.  {Refresh Note OR Click here to enter BP  :1}***       Physical Exam VS:  LMP 05/30/2015    Wt Readings from  Last 3 Encounters:  02/05/24 123 lb (55.8 kg)  12/30/23 140 lb (63.5 kg)  10/02/23 137 lb (62.1 kg)    GEN: Well nourished, well developed in no acute distress NECK: No JVD; No carotid bruits CARDIAC: ***RRR, no murmurs, rubs, gallops RESPIRATORY:  Clear to auscultation without rales, wheezing or rhonchi  ABDOMEN: Soft, non-tender, non-distended EXTREMITIES:  No edema; No deformity   ASSESSMENT AND PLAN ***    {Are you ordering a CV Procedure (e.g. stress test, cath, DCCV, TEE, etc)?   Press F2        :[id number]}  Dispo: ***  Signed, Rosaline Bane, NP-C

## 2024-04-12 ENCOUNTER — Other Ambulatory Visit: Payer: Self-pay | Admitting: Internal Medicine

## 2024-04-12 DIAGNOSIS — E1169 Type 2 diabetes mellitus with other specified complication: Secondary | ICD-10-CM

## 2024-04-12 NOTE — Telephone Encounter (Unsigned)
 Copied from CRM [id number]. Topic: Clinical - Medication Refill >> Apr 12, 2024 11:07 AM Kevelyn M wrote: Medication: Semaglutide ,0.25 or 0.5MG /DOS  Has the patient contacted their pharmacy? No (Agent: If no, request that the patient contact the pharmacy for the refill. If patient does not wish to contact the pharmacy document the reason why and proceed with request.) (Agent: If yes, when and what did the pharmacy advise?)  This is the patient's preferred pharmacy:  [id number] [place], [place] - [address] [place] [address] Phone: [phone] Fax: [phone]  Is this the correct pharmacy for this prescription? Yes If no, delete pharmacy and type the correct one.   Has the prescription been filled recently? No  Is the patient out of the medication? Yes  Has the patient been seen for an appointment in the last year OR does the patient have an upcoming appointment? Yes  Can we respond through MyChart? Yes  Agent: Please be advised that Rx refills may take up to 3 business days. We ask that you follow-up with your pharmacy.

## 2024-04-13 ENCOUNTER — Ambulatory Visit: Payer: Medicare HMO

## 2024-04-14 ENCOUNTER — Ambulatory Visit

## 2024-04-14 ENCOUNTER — Ambulatory Visit: Admitting: Physician Assistant

## 2024-04-14 ENCOUNTER — Telehealth: Payer: Self-pay

## 2024-04-14 ENCOUNTER — Encounter: Payer: Self-pay | Admitting: Physician Assistant

## 2024-04-14 VITALS — BP 127/83 | HR 77 | Ht 59.0 in | Wt 123.0 lb

## 2024-04-14 DIAGNOSIS — R259 Unspecified abnormal involuntary movements: Secondary | ICD-10-CM | POA: Diagnosis not present

## 2024-04-14 DIAGNOSIS — Z79899 Other long term (current) drug therapy: Secondary | ICD-10-CM | POA: Diagnosis not present

## 2024-04-14 NOTE — Telephone Encounter (Signed)
 Copied from CRM #[id number]. Topic: Clinical - Medical Advice >> Apr 13, 2024  2:46 PM Emylou G wrote: Reason for CRM: Patient called: feels like their is a twitch on her tongue that keeps coming and going this past week ( sometimes its hard ) - on left side. Would like call back

## 2024-04-14 NOTE — Telephone Encounter (Signed)
 Per Patient going to mobile unit today.  [employer] [address] [place] [address]

## 2024-04-14 NOTE — Patient Instructions (Signed)
 The name of the 2 test are Genomind and Genesight  I strongly encourage you to speak with your behavioral health provider as soon as possible and tell them about the potential side effect that you are having.  Please let us  know if there is anything else we can do for you  Kirk CANDIE Sage, PA-C Physician Assistant [facility] [email or web address]   Tardive Dyskinesia Tardive dyskinesia is a disorder that causes uncontrollable body movements. It occurs in some people who are taking certain medicines to treat a mental illness (neuroleptic medicine) or have taken this type of medicine in the past. These medicines block the effects of a specific brain chemical called dopamine.  Sometimes, tardive dyskinesia starts months or years after someone took the medicine. Not everyone who takes a neuroleptic medicine will get tardive dyskinesia. What are the causes? This condition is caused by changes in your brain that are associated with taking a neuroleptic medicine. What increases the risk? If you are taking a neuroleptic medicine, your risk for tardive dyskinesia may be higher if: You are taking an older type of neuroleptic medicine. You have been taking the medicine for a long time at a high dose. You are a woman past the age of menopause. You are older than 60 years. You have a history of alcohol  or drug abuse. What are the signs or symptoms? Abnormal, uncontrollable movements are the main symptom of tardive dyskinesia. These types of movements may include: Grimacing. Sticking out or twisting your tongue. Making chewing or sucking sounds. Blinking your eyes. Twisting, swaying, or thrusting your body. Foot tapping or finger waving. Rapid movements of your arms or legs. How is this diagnosed? Your health care provider may suspect that you have tardive dyskinesia if: You have been taking neuroleptic medicines. You have abnormal movements  that you cannot control. If you are taking a medicine that can cause tardive dyskinesia, your health care provider may screen you for early signs of the condition. This may include: Observing your body movements. Using a specific rating scale called the Abnormal Involuntary Movement Scale (AIMS). You may also have tests to rule out other conditions that cause abnormal body movements, including: Parkinson's disease. Huntington's disease. Stroke. How is this treated? The best treatment for tardive dyskinesia is to lower the dose of your medicine or to switch to a different medicine at the first sign of abnormal and uncontrolled movements. There is no cure for long-term (chronic) tardive dyskinesia. Some medicines may help control the movements. These include: Medicines called VMAT2 inhibitors that control chemicals in the brain. Clozapine, a medicine used to treat mental illness (antipsychotic). Some muscle relaxants. Some anti-seizure medicines. Some medicines used to treat high blood pressure. Some tranquilizers (sedatives). Follow these instructions at home:     Take over-the-counter and prescription medicines only as told by your health care provider. Do not stop or start taking any medicines without talking to your health care provider first. Do not abuse drugs or alcohol . Keep all follow-up visits. This is important. Contact a health care provider if: You are unable to eat or drink. You have had a fall. Your symptoms get worse. This information is not intended to replace advice given to you by your health care provider. Make sure you discuss any questions you have with your health care provider. Document Revised: 11/27/2022 Document Reviewed: 06/03/2021 Elsevier Patient Education  2024 [employer].

## 2024-04-14 NOTE — Progress Notes (Addendum)
 This encounter was created in error - please disregard.
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
## 2024-04-15 ENCOUNTER — Encounter: Payer: Self-pay | Admitting: Physician Assistant

## 2024-04-19 ENCOUNTER — Telehealth: Payer: Self-pay | Admitting: Internal Medicine

## 2024-04-19 NOTE — Telephone Encounter (Signed)
 Good Afternoon Dr.Pyrtle,  Patient calling wanting to schedule an office visit with you or PA due to having LQ pain, she was a patient of yours in April 2023 but then switched to [facility] due to having GI issues and them having availability in May 2024. Patient wants to transfer care back to us  due having her colonoscopy done at our office.   Please advise for future scheduling  Thank you

## 2024-04-20 NOTE — Telephone Encounter (Signed)
 Ok for a POD A appt

## 2024-04-21 ENCOUNTER — Encounter (HOSPITAL_BASED_OUTPATIENT_CLINIC_OR_DEPARTMENT_OTHER): Payer: Self-pay

## 2024-05-10 ENCOUNTER — Encounter: Payer: Self-pay | Admitting: Gastroenterology

## 2024-05-10 ENCOUNTER — Ambulatory Visit: Admitting: Gastroenterology

## 2024-05-10 VITALS — BP 118/78 | HR 87 | Ht 59.0 in | Wt 127.0 lb

## 2024-05-10 DIAGNOSIS — R194 Change in bowel habit: Secondary | ICD-10-CM

## 2024-05-10 DIAGNOSIS — K449 Diaphragmatic hernia without obstruction or gangrene: Secondary | ICD-10-CM

## 2024-05-10 DIAGNOSIS — K5900 Constipation, unspecified: Secondary | ICD-10-CM | POA: Diagnosis not present

## 2024-05-10 DIAGNOSIS — R7989 Other specified abnormal findings of blood chemistry: Secondary | ICD-10-CM

## 2024-05-10 DIAGNOSIS — K219 Gastro-esophageal reflux disease without esophagitis: Secondary | ICD-10-CM

## 2024-05-10 DIAGNOSIS — R1032 Left lower quadrant pain: Secondary | ICD-10-CM

## 2024-05-10 DIAGNOSIS — Z860101 Personal history of adenomatous and serrated colon polyps: Secondary | ICD-10-CM

## 2024-05-10 DIAGNOSIS — Z860102 Personal history of hyperplastic colon polyps: Secondary | ICD-10-CM | POA: Diagnosis not present

## 2024-05-10 MED ORDER — NA SULFATE-K SULFATE-MG SULF 17.5-3.13-1.6 GM/177ML PO SOLN: 1.0000 | Freq: Once | ORAL | 0 refills | Status: AC

## 2024-05-10 NOTE — Patient Instructions (Signed)
 Miralax: 1 capful daily IbGard: as needed usually before food  Your provider has requested that you go to the basement level for lab work before leaving today. Press B on the elevator. The lab is located at the first door on the left as you exit the elevator.  We have sent the following medications to your pharmacy for you to pick up at your convenience: SUPREP  You have been scheduled for a colonoscopy. Please follow written instructions given to you at your visit today.   If you use inhalers (even only as needed), please bring them with you on the day of your procedure.  DO NOT TAKE 7 DAYS PRIOR TO TEST- Trulicity (dulaglutide) Ozempic , Wegovy  (semaglutide ) Mounjaro (tirzepatide) Bydureon Bcise (exanatide extended release)  DO NOT TAKE 1 DAY PRIOR TO YOUR TEST Rybelsus  (semaglutide ) Adlyxin (lixisenatide) Victoza  (liraglutide ) Byetta (exanatide) ___________________________________________________________________________  Due to recent changes in healthcare laws, you may see the results of your imaging and laboratory studies on MyChart before your provider has had a chance to review them.  We understand that in some cases there may be results that are confusing or concerning to you. Not all laboratory results come back in the same time frame and the provider may be waiting for multiple results in order to interpret others.  Please give us  48 hours in order for your provider to thoroughly review all the results before contacting the office for clarification of your results.   _______________________________________________________  If your blood pressure at your visit was 140/90 or greater, please contact your primary care physician to follow up on this.  _______________________________________________________  If you are age 62 or older, your body mass index should be between 23-30. Your Body mass index is 25.65 kg/m. If this is out of the aforementioned range listed, please  consider follow up with your Primary Care Provider.  If you are age 62 or younger, your body mass index should be between 19-25. Your Body mass index is 25.65 kg/m. If this is out of the aformentioned range listed, please consider follow up with your Primary Care Provider.   ________________________________________________________  The [facility] providers would like to encourage you to use MYCHART to communicate with providers for non-urgent requests or questions.  Due to long hold times on the telephone, sending your provider a message by [facility] may be a faster and more efficient way to get a response.  Please allow 48 business hours for a response.  Please remember that this is for non-urgent requests.  _______________________________________________________  [facility] is using a team-based approach to care.  Your team is made up of your doctor and two to three APPS. Our APPS (Nurse Practitioners and Physician Assistants) work with your physician to ensure care continuity for you. They are fully qualified to address your health concerns and develop a treatment plan. They communicate directly with your gastroenterologist to care for you. Seeing the Advanced Practice Practitioners on your physician's team can help you by facilitating care more promptly, often allowing for earlier appointments, access to diagnostic testing, procedures, and other specialty referrals.
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
## 2024-05-18 ENCOUNTER — Telehealth: Payer: Self-pay | Admitting: Gastroenterology

## 2024-05-18 NOTE — Telephone Encounter (Signed)
 Liver biopsy with [facility] 01/09/2023 Steatohepatitis, grade 2, stage II.  No evidence of iron deposition changes are not consistent with autoimmune hepatitis

## 2024-06-02 ENCOUNTER — Encounter (HOSPITAL_BASED_OUTPATIENT_CLINIC_OR_DEPARTMENT_OTHER): Payer: Self-pay | Admitting: Otolaryngology

## 2024-06-02 ENCOUNTER — Other Ambulatory Visit: Payer: Self-pay

## 2024-06-03 ENCOUNTER — Other Ambulatory Visit: Payer: Self-pay | Admitting: Otolaryngology

## 2024-06-08 ENCOUNTER — Encounter: Payer: Self-pay | Admitting: Internal Medicine

## 2024-06-08 ENCOUNTER — Ambulatory Visit: Attending: Internal Medicine | Admitting: Internal Medicine

## 2024-06-08 VITALS — BP 103/66 | HR 90 | Temp 98.3°F | Ht 59.0 in | Wt 131.0 lb

## 2024-06-08 DIAGNOSIS — E1169 Type 2 diabetes mellitus with other specified complication: Secondary | ICD-10-CM | POA: Diagnosis not present

## 2024-06-08 DIAGNOSIS — E785 Hyperlipidemia, unspecified: Secondary | ICD-10-CM | POA: Diagnosis not present

## 2024-06-08 DIAGNOSIS — E1159 Type 2 diabetes mellitus with other circulatory complications: Secondary | ICD-10-CM | POA: Diagnosis not present

## 2024-06-08 DIAGNOSIS — F330 Major depressive disorder, recurrent, mild: Secondary | ICD-10-CM

## 2024-06-08 DIAGNOSIS — E119 Type 2 diabetes mellitus without complications: Secondary | ICD-10-CM

## 2024-06-08 DIAGNOSIS — I10 Essential (primary) hypertension: Secondary | ICD-10-CM

## 2024-06-08 DIAGNOSIS — Z7984 Long term (current) use of oral hypoglycemic drugs: Secondary | ICD-10-CM

## 2024-06-08 DIAGNOSIS — Z01818 Encounter for other preprocedural examination: Secondary | ICD-10-CM

## 2024-06-08 DIAGNOSIS — E1165 Type 2 diabetes mellitus with hyperglycemia: Secondary | ICD-10-CM

## 2024-06-08 DIAGNOSIS — Z7985 Long-term (current) use of injectable non-insulin antidiabetic drugs: Secondary | ICD-10-CM | POA: Diagnosis not present

## 2024-06-08 LAB — POCT GLYCOSYLATED HEMOGLOBIN (HGB A1C): HbA1c, POC (controlled diabetic range): 6.4 % (ref 0.0–7.0)

## 2024-06-08 LAB — GLUCOSE, POCT (MANUAL RESULT ENTRY): POC Glucose: 221 mg/dL — AB (ref 70–99)

## 2024-06-08 MED ORDER — METFORMIN HCL ER 500 MG PO TB24
1000.0000 mg | ORAL_TABLET | Freq: Every day | ORAL | Status: AC
Start: 2024-06-08 — End: ?

## 2024-06-08 NOTE — Patient Instructions (Addendum)
  VISIT SUMMARY: Today, you came in for a check-up to manage your chronic conditions and to get clearance for your brow lifting surgery scheduled for tomorrow. We discussed your diabetes, hypertension, and cholesterol management, as well as your recent discontinuation of depression medications. We also talked about your increased appetite and weight gain since stopping your depression medications.  YOUR PLAN: -PREOPERATIVE MEDICAL CLEARANCE FOR BROW LIFT SURGERY: You are scheduled for brow lift surgery tomorrow, and it is important to ensure that your chronic conditions are stable before the procedure. We will monitor your health closely to make sure you are ready for surgery.  -TYPE 2 DIABETES MELLITUS: Type 2 diabetes is a condition where your body does not use insulin  properly, leading to high blood sugar levels. Your A1c is at 6.4, which is within the target range. Continue taking metformin  1000 mg daily and Ozempic  0.25 mg weekly, but hold off on Ozempic  until after your surgery. Keep monitoring your blood sugar regularly. Increase your Ozempic  dose to 0.5 mg weekly and reduce metformin  to 1000 mg once daily.  -HYPERTENSION: Hypertension is high blood pressure, which can lead to serious health issues if not managed. Your blood pressure is well-controlled at 103/66 mmHg. Continue taking Cozaar  50 mg daily and amlodipine  5 mg daily. Maintain a low-salt diet to help control your blood pressure.  -HYPERLIPIDEMIA: Hyperlipidemia is having high levels of fats (lipids) in your blood, which can increase your risk of heart disease. You have not had a follow-up for your cholesterol since March and are not currently on any cholesterol-lowering medication. We have referred you to Dr. Mona for cholesterol management and ordered blood tests to check your cholesterol, kidney, and liver function.  -DEPRESSION: Depression is a mood disorder that causes persistent feelings of sadness and loss of interest. You  stopped taking Pristiq and Buspar two months ago and have experienced increased crying and appetite since then. We have referred you to [facility] for psychiatric evaluation and management. If you have any thoughts of self-harm, please seek emergency care immediately.  INSTRUCTIONS: Please follow up with Dr. Mona for cholesterol management and complete the blood tests for cholesterol, kidney, and liver function as ordered. Additionally, attend the psychiatric evaluation at [facility] as referred. If you experience any thoughts of self-harm, seek emergency care immediately.                      Contains text generated by Abridge.                                 Contains text generated by Abridge.
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
## 2024-06-09 ENCOUNTER — Other Ambulatory Visit: Payer: Self-pay

## 2024-06-09 ENCOUNTER — Ambulatory Visit (HOSPITAL_BASED_OUTPATIENT_CLINIC_OR_DEPARTMENT_OTHER): Admitting: Certified Registered"

## 2024-06-09 ENCOUNTER — Ambulatory Visit: Payer: Self-pay | Admitting: Internal Medicine

## 2024-06-09 ENCOUNTER — Other Ambulatory Visit: Payer: Self-pay | Admitting: Internal Medicine

## 2024-06-09 ENCOUNTER — Encounter (HOSPITAL_BASED_OUTPATIENT_CLINIC_OR_DEPARTMENT_OTHER)
Admission: RE | Disposition: A | Discharge status: Home / Self Care | Payer: Self-pay | Source: Home / Self Care | Attending: Otolaryngology

## 2024-06-09 ENCOUNTER — Ambulatory Visit (HOSPITAL_BASED_OUTPATIENT_CLINIC_OR_DEPARTMENT_OTHER)
Admission: RE | Admit: 2024-06-09 | Discharge: 2024-06-09 | Disposition: A | Discharge status: Home / Self Care | Attending: Otolaryngology | Admitting: Otolaryngology

## 2024-06-09 ENCOUNTER — Encounter (HOSPITAL_BASED_OUTPATIENT_CLINIC_OR_DEPARTMENT_OTHER): Payer: Self-pay | Admitting: Otolaryngology

## 2024-06-09 DIAGNOSIS — I10 Essential (primary) hypertension: Secondary | ICD-10-CM | POA: Insufficient documentation

## 2024-06-09 DIAGNOSIS — E781 Pure hyperglyceridemia: Secondary | ICD-10-CM

## 2024-06-09 DIAGNOSIS — E1169 Type 2 diabetes mellitus with other specified complication: Secondary | ICD-10-CM

## 2024-06-09 DIAGNOSIS — Z7984 Long term (current) use of oral hypoglycemic drugs: Secondary | ICD-10-CM | POA: Diagnosis not present

## 2024-06-09 DIAGNOSIS — K219 Gastro-esophageal reflux disease without esophagitis: Secondary | ICD-10-CM | POA: Diagnosis not present

## 2024-06-09 DIAGNOSIS — H02831 Dermatochalasis of right upper eyelid: Secondary | ICD-10-CM | POA: Insufficient documentation

## 2024-06-09 DIAGNOSIS — H02834 Dermatochalasis of left upper eyelid: Secondary | ICD-10-CM | POA: Diagnosis present

## 2024-06-09 DIAGNOSIS — E119 Type 2 diabetes mellitus without complications: Secondary | ICD-10-CM | POA: Diagnosis not present

## 2024-06-09 DIAGNOSIS — H57813 Brow ptosis, bilateral: Secondary | ICD-10-CM | POA: Diagnosis present

## 2024-06-09 DIAGNOSIS — E1159 Type 2 diabetes mellitus with other circulatory complications: Secondary | ICD-10-CM

## 2024-06-09 HISTORY — PX: BROW LIFT: SHX178

## 2024-06-09 LAB — NO BLOOD PRODUCTS

## 2024-06-09 LAB — CBC
Hematocrit: 41.4 % (ref 34.0–46.6)
Hemoglobin: 13.5 g/dL (ref 11.1–15.9)
MCH: 28.2 pg (ref 26.6–33.0)
MCHC: 32.6 g/dL (ref 31.5–35.7)
MCV: 86 fL (ref 79–97)
Platelets: 274 x10E3/uL (ref 150–450)
RBC: 4.79 x10E6/uL (ref 3.77–5.28)
RDW: 13.4 % (ref 11.7–15.4)
WBC: 6.5 x10E3/uL (ref 3.4–10.8)

## 2024-06-09 LAB — COMPREHENSIVE METABOLIC PANEL WITH GFR
ALT: 14 IU/L (ref 0–32)
AST: 16 IU/L (ref 0–40)
Albumin: 4.6 g/dL (ref 3.9–4.9)
Alkaline Phosphatase: 159 IU/L — ABNORMAL HIGH (ref 49–135)
BUN/Creatinine Ratio: 31 — ABNORMAL HIGH (ref 12–28)
BUN: 15 mg/dL (ref 8–27)
Bilirubin Total: <0.2 mg/dL (ref 0.0–1.2)
CO2: 22 mmol/L (ref 20–29)
Calcium: 9.7 mg/dL (ref 8.7–10.3)
Chloride: 98 mmol/L (ref 96–106)
Creatinine, Ser: 0.49 mg/dL — ABNORMAL LOW (ref 0.57–1.00)
Globulin, Total: 2.6 g/dL (ref 1.5–4.5)
Glucose: 164 mg/dL — ABNORMAL HIGH (ref 70–99)
Potassium: 4.4 mmol/L (ref 3.5–5.2)
Sodium: 138 mmol/L (ref 134–144)
Total Protein: 7.2 g/dL (ref 6.0–8.5)
eGFR: 106 mL/min/1.73 (ref >59)

## 2024-06-09 LAB — LIPID PANEL
Chol/HDL Ratio: 9.7 ratio — ABNORMAL HIGH (ref 0.0–4.4)
Cholesterol, Total: 271 mg/dL — ABNORMAL HIGH (ref 100–199)
HDL: 28 mg/dL — ABNORMAL LOW (ref >39)
Triglycerides: 940 mg/dL (ref 0–149)

## 2024-06-09 LAB — GLUCOSE, CAPILLARY
Glucose-Capillary: 117 mg/dL — ABNORMAL HIGH (ref 70–99)
Glucose-Capillary: 117 mg/dL — ABNORMAL HIGH (ref 70–99)

## 2024-06-09 SURGERY — BLEPHAROPLASTY
Anesthesia: Monitor Anesthesia Care | Site: Eye | Laterality: Bilateral

## 2024-06-09 MED ORDER — TOBRAMYCIN 0.3 % OP OINT
TOPICAL_OINTMENT | OPHTHALMIC | Status: DC | PRN
  Administered 2024-06-09: 1 via OPHTHALMIC

## 2024-06-09 MED ORDER — DROPERIDOL 2.5 MG/ML IJ SOLN: 0.6250 mg | Freq: Once | INTRAMUSCULAR | Status: DC | PRN

## 2024-06-09 MED ORDER — PROPOFOL 500 MG/50ML IV EMUL
INTRAVENOUS | Status: DC | PRN
  Administered 2024-06-09: 100 ug/kg/min via INTRAVENOUS

## 2024-06-09 MED ORDER — ERYTHROMYCIN 5 MG/GM OP OINT: 1.0000 | TOPICAL_OINTMENT | Freq: Two times a day (BID) | OPHTHALMIC | 0 refills | Status: AC

## 2024-06-09 MED ORDER — MIDAZOLAM HCL 2 MG/2ML IJ SOLN
INTRAMUSCULAR | Status: AC
  Filled 2024-06-09: qty 2

## 2024-06-09 MED ORDER — ONDANSETRON 4 MG PO TBDP
4.0000 mg | ORAL_TABLET | Freq: Once | ORAL | Status: AC
  Administered 2024-06-09: 4 mg via ORAL

## 2024-06-09 MED ORDER — BUPIVACAINE HCL (PF) 0.25 % IJ SOLN
INTRAMUSCULAR | Status: AC
  Filled 2024-06-09: qty 30

## 2024-06-09 MED ORDER — LIDOCAINE-EPINEPHRINE 1 %-1:100000 IJ SOLN
INTRAMUSCULAR | Status: AC
  Filled 2024-06-09: qty 1

## 2024-06-09 MED ORDER — LIDOCAINE-EPINEPHRINE 1 %-1:100000 IJ SOLN
INTRAMUSCULAR | Status: DC | PRN
  Administered 2024-06-09: 4.5 mL

## 2024-06-09 MED ORDER — FENTANYL CITRATE (PF) 100 MCG/2ML IJ SOLN
INTRAMUSCULAR | Status: AC
  Filled 2024-06-09: qty 2

## 2024-06-09 MED ORDER — BSS IO SOLN
INTRAOCULAR | Status: AC
  Filled 2024-06-09: qty 15

## 2024-06-09 MED ORDER — MIDAZOLAM HCL (PF) 2 MG/2ML IJ SOLN
INTRAMUSCULAR | Status: DC | PRN
  Administered 2024-06-09: 2 mg via INTRAVENOUS

## 2024-06-09 MED ORDER — TETRACAINE HCL 0.5 % OP SOLN
OPHTHALMIC | Status: AC
  Filled 2024-06-09: qty 4

## 2024-06-09 MED ORDER — FENTANYL CITRATE (PF) 100 MCG/2ML IJ SOLN
INTRAMUSCULAR | Status: DC | PRN
  Administered 2024-06-09 (×2): 25 ug via INTRAVENOUS
  Administered 2024-06-09: 50 ug via INTRAVENOUS

## 2024-06-09 MED ORDER — LACTATED RINGERS IV SOLN: INTRAVENOUS | Status: DC

## 2024-06-09 MED ORDER — SODIUM BICARBONATE 4.2 % IV SOLN
INTRAVENOUS | Status: DC | PRN
  Administered 2024-06-09: .5 mL via INTRAVENOUS

## 2024-06-09 MED ORDER — FENTANYL CITRATE (PF) 100 MCG/2ML IJ SOLN
25.0000 ug | INTRAMUSCULAR | Status: DC | PRN
  Administered 2024-06-09 (×3): 50 ug via INTRAVENOUS

## 2024-06-09 MED ORDER — CEFAZOLIN SODIUM-DEXTROSE 2-4 GM/100ML-% IV SOLN
INTRAVENOUS | Status: AC
  Filled 2024-06-09: qty 100

## 2024-06-09 MED ORDER — CEFAZOLIN SODIUM-DEXTROSE 2-4 GM/100ML-% IV SOLN
2.0000 g | INTRAVENOUS | Status: AC
  Administered 2024-06-09: 2 g via INTRAVENOUS

## 2024-06-09 MED ORDER — ONDANSETRON HCL 4 MG/2ML IJ SOLN
INTRAMUSCULAR | Status: AC
Start: 2024-06-09 — End: 2024-06-09
  Filled 2024-06-09: qty 2

## 2024-06-09 MED ORDER — POVIDONE-IODINE 5 % OP SOLN
OPHTHALMIC | Status: AC
Start: 2024-06-09 — End: 2024-06-09
  Filled 2024-06-09: qty 30

## 2024-06-09 MED ORDER — BACITRACIN ZINC 500 UNIT/GM EX OINT
TOPICAL_OINTMENT | CUTANEOUS | Status: DC | PRN
  Administered 2024-06-09: 1 via TOPICAL

## 2024-06-09 MED ORDER — ONDANSETRON HCL 4 MG/2ML IJ SOLN
INTRAMUSCULAR | Status: DC | PRN
  Administered 2024-06-09: 4 mg via INTRAVENOUS

## 2024-06-09 MED ORDER — TETRACAINE HCL 0.5 % OP SOLN
OPHTHALMIC | Status: DC | PRN
  Administered 2024-06-09: 2 [drp] via OPHTHALMIC

## 2024-06-09 MED ORDER — ONDANSETRON 4 MG PO TBDP
ORAL_TABLET | ORAL | Status: AC
  Filled 2024-06-09: qty 1

## 2024-06-09 MED ORDER — TOBRAMYCIN-DEXAMETHASONE 0.3-0.1 % OP OINT
TOPICAL_OINTMENT | OPHTHALMIC | Status: AC
Start: 2024-06-09 — End: 2024-06-09
  Filled 2024-06-09: qty 3.5

## 2024-06-09 MED ORDER — GEMFIBROZIL 600 MG PO TABS: 600.0000 mg | ORAL_TABLET | Freq: Two times a day (BID) | ORAL | 1 refills | Status: DC

## 2024-06-09 MED ORDER — POVIDONE-IODINE 5 % OP SOLN
OPHTHALMIC | Status: DC | PRN
  Administered 2024-06-09: 1 via OPHTHALMIC

## 2024-06-09 MED ORDER — SODIUM BICARBONATE 4.2 % IV SOLN
INTRAVENOUS | Status: AC
  Filled 2024-06-09: qty 5

## 2024-06-09 MED ORDER — LIDOCAINE 2% (20 MG/ML) 5 ML SYRINGE
INTRAMUSCULAR | Status: AC
  Filled 2024-06-09: qty 5

## 2024-06-09 MED ORDER — 0.9 % SODIUM CHLORIDE (POUR BTL) OPTIME
TOPICAL | Status: DC | PRN
  Administered 2024-06-09: 500 mL

## 2024-06-09 MED ORDER — PHENYLEPHRINE HCL (PRESSORS) 10 MG/ML IV SOLN
INTRAVENOUS | Status: DC | PRN
  Administered 2024-06-09: 160 ug via INTRAVENOUS

## 2024-06-09 MED ORDER — BACITRACIN ZINC 500 UNIT/GM EX OINT
TOPICAL_OINTMENT | CUTANEOUS | Status: AC
Start: 2024-06-09 — End: 2024-06-09
  Filled 2024-06-09: qty 28.35

## 2024-06-09 MED ORDER — PROPOFOL 10 MG/ML IV BOLUS
INTRAVENOUS | Status: AC
  Filled 2024-06-09: qty 20

## 2024-06-09 SURGICAL SUPPLY — 48 items
APPLICATOR COTTON TIP 6 STRL (MISCELLANEOUS) IMPLANT
APPLICATOR DR MATTHEWS STRL (MISCELLANEOUS) ×1 IMPLANT
BLADE SURG 11 STRL SS (BLADE) IMPLANT
BLADE SURG 15 STRL LF DISP TIS (BLADE) ×2 IMPLANT
BNDG EYE OVAL 2 1/8 X 2 5/8 (GAUZE/BANDAGES/DRESSINGS) IMPLANT
CAUTERY EYE LOW TEMP OLD (MISCELLANEOUS) IMPLANT
CORD BIPOLAR FORCEPS 12FT (ELECTRODE) IMPLANT
COVER BACK TABLE 60X90IN (DRAPES) ×1 IMPLANT
COVER MAYO STAND STRL (DRAPES) ×1 IMPLANT
DRAPE SURG 17X23 STRL (DRAPES) IMPLANT
DRAPE U-SHAPE 76X120 STRL (DRAPES) ×1 IMPLANT
DRSG TELFA 3X8 NADH STRL (GAUZE/BANDAGES/DRESSINGS) IMPLANT
ELECT COATED BLADE 2.86 ST (ELECTRODE) IMPLANT
ELECT NDL BLADE 2-5/6 (NEEDLE) ×1 IMPLANT
ELECT NEEDLE BLADE 2-5/6 (NEEDLE) ×1 IMPLANT
ELECTRODE REM PT RTRN 9FT ADLT (ELECTROSURGICAL) ×1 IMPLANT
FORCEPS BIPOLAR SPETZLER 8 1.0 (NEUROSURGERY SUPPLIES) IMPLANT
GAUZE SPONGE 4X4 12PLY STRL (GAUZE/BANDAGES/DRESSINGS) ×1 IMPLANT
GLOVE BIO SURGEON STRL SZ7.5 (GLOVE) ×1 IMPLANT
GLOVE BIOGEL PI IND STRL 8 (GLOVE) ×1 IMPLANT
GOWN STRL REUS W/ TWL LRG LVL3 (GOWN DISPOSABLE) ×1 IMPLANT
GOWN STRL REUS W/ TWL XL LVL3 (GOWN DISPOSABLE) ×1 IMPLANT
MARKER SKIN DUAL TIP RULER LAB (MISCELLANEOUS) IMPLANT
NDL HYPO 30GX1 BEV (NEEDLE) IMPLANT
NDL HYPO 30X.5 LL (NEEDLE) ×1 IMPLANT
NDL PRECISIONGLIDE 27X1.5 (NEEDLE) IMPLANT
NDL SAFETY ECLIPSE 18X1.5 (NEEDLE) ×1 IMPLANT
NEEDLE HYPO 30GX1 BEV (NEEDLE) ×1 IMPLANT
NEEDLE HYPO 30X.5 LL (NEEDLE) ×1 IMPLANT
NEEDLE PRECISIONGLIDE 27X1.5 (NEEDLE) IMPLANT
PACK BASIN DAY SURGERY FS (CUSTOM PROCEDURE TRAY) ×1 IMPLANT
PENCIL SMOKE EVACUATOR (MISCELLANEOUS) ×1 IMPLANT
SHEET MEDIUM DRAPE 40X70 STRL (DRAPES) IMPLANT
SHIELD EYE MED CORNL SHD 22X21 (OPHTHALMIC RELATED) ×1 IMPLANT
SLEEVE SCD COMPRESS KNEE MED (STOCKING) IMPLANT
SOLN 0.9% NACL POUR BTL 1000ML (IV SOLUTION) ×1 IMPLANT
SUCTION TUBE FRAZIER 10FR DISP (SUCTIONS) IMPLANT
SUT ETHILON 6 0 P 1 (SUTURE) ×1 IMPLANT
SUT PLAIN 6 0 TG1408 (SUTURE) IMPLANT
SUT PLAIN GUT FAST 5-0 (SUTURE) ×1 IMPLANT
SUT VIC AB 4-0 PS2 18 (SUTURE) IMPLANT
SUT VIC AB 5-0 P-3 18X BRD (SUTURE) IMPLANT
SUT VICRYL RAPIDE 4-0 (SUTURE) IMPLANT
SYR 5ML LL (SYRINGE) ×1 IMPLANT
SYR 5ML LUER SLIP (SYRINGE) ×1 IMPLANT
SYR CONTROL 10ML LL (SYRINGE) IMPLANT
TOWEL GREEN STERILE FF (TOWEL DISPOSABLE) ×1 IMPLANT
TUBE CONNECTING 20X1/4 (TUBING) IMPLANT

## 2024-06-09 NOTE — Anesthesia Preprocedure Evaluation (Addendum)
 Anesthesia Evaluation  Patient identified by MRN, date of birth, ID band Patient awake    Reviewed: Allergy & Precautions, NPO status , Patient's Chart, lab work & pertinent test results  History of Anesthesia Complications Negative for: history of anesthetic complications  Airway Mallampati: IV  TM Distance: >3 FB Neck ROM: Full    Dental  (+) Missing,    Pulmonary neg pulmonary ROS   Pulmonary exam normal        Cardiovascular hypertension, Pt. on medications Normal cardiovascular exam     Neuro/Psych   Anxiety Depression Bipolar Disorder   negative neurological ROS     GI/Hepatic Neg liver ROS, hiatal hernia,GERD  Controlled,,  Endo/Other  diabetes (on Ozempic ), Type 2, Oral Hypoglycemic Agents    Renal/GU negative Renal ROS     Musculoskeletal negative musculoskeletal ROS (+)    Abdominal   Peds  Hematology  (+) REFUSES BLOOD PRODUCTS, JEHOVAH'S WITNESS  Anesthesia Other Findings Dermatochalasis of both upper eyelids; Brow ptosis, bilateral  Reproductive/Obstetrics                              Anesthesia Physical Anesthesia Plan  ASA: 2  Anesthesia Plan: MAC   Post-op Pain Management: Tylenol  PO (pre-op)*   Induction:   PONV Risk Score and Plan: 2 and Treatment may vary due to age or medical condition, Propofol  infusion, Ondansetron  and Midazolam   Airway Management Planned: Natural Airway and Simple Face Mask  Additional Equipment: None  Intra-op Plan:   Post-operative Plan:   Informed Consent: I have reviewed the patients History and Physical, chart, labs and discussed the procedure including the risks, benefits and alternatives for the proposed anesthesia with the patient or authorized representative who has indicated his/her understanding and acceptance.       Plan Discussed with: CRNA  Anesthesia Plan Comments:          Anesthesia Quick Evaluation

## 2024-06-09 NOTE — Discharge Instructions (Addendum)
 Post-operative Patient Instructions after Eyelid Surgery Amy MATSU. Hoshal MD  Surgery What to expect: Mild bruising and swelling is normal For the first few days after surgery apply ice packs to your eyelids; 20 minutes on , 20 minutes off throughout the day to reduce bruising  Recovery/Restrictions: -No strenuous activity for at least the first week after your procedure -Bruising and swelling are expected and will take weeks to go away (consider using ice packs) -Please contact our office immediately if you experience any signs/symptoms of infection (redness, pain, or fever of 100.58F or greater)  Wound Wound care:  1. Apply antibiotic ointment provided to you at surgery twice daily until your first postop visit.   Care Healing Period: For the best healing, please protect the area from the sun   You may be asked to begin massaging the scars several weeks after surgery. Scars can be massaged in horizontal, vertical, and circular motions.   Adult Post-Operative Pain Management  Pain medication is given immediately following your surgery to help with post-operative pain. Do not wake up or set an alarm to wake up and take pain medications. Sleep and rest.  Upon your discharge home, we suggest scheduled doses of Acetaminophen  (Tylenol ) every 6 hours and Ibuprofen  (Motrin ) every 6 hours, alternating between medications every 3 hours (i.e. Take Tylenol  and wait 3 hours, then take Motrin  and wait 3 hours, repeat) for the first 3-4 days after surgery. If you are without significant pain, medications can be taken more infrequently. It is important to follow dosing instructions on the medication bottle or prescription.   Sample of medication dosing schedule  Give dose of: Time: Given:  Acetaminophen  12 a.m.   Ibuprofen  3 a.m.   Acetaminophen  6 a.m.   Ibuprofen  9 a.m.   Acetaminophen  12 p.m.   Ibuprofen  3 p.m.   Acetaminophen  6 p.m.   Ibuprofen  9 p.m.    If you need to call after clinic  hours for a concern, call [phone] and ask for the "physician on call for ENT."  [address] [place], [place] [address] Phone: [phone]     Post Anesthesia Home Care Instructions  Activity: Get plenty of rest for the remainder of the day. A responsible individual must stay with you for 24 hours following the procedure.  For the next 24 hours, DO NOT: -Drive a car -Advertising copywriter -Drink alcoholic beverages -Take any medication unless instructed by your physician -Make any legal decisions or sign important papers.  Meals: Start with liquid foods such as gelatin or soup. Progress to regular foods as tolerated. Avoid greasy, spicy, heavy foods. If nausea and/or vomiting occur, drink only clear liquids until the nausea and/or vomiting subsides. Call your physician if vomiting continues.  Special Instructions/Symptoms: Your throat may feel dry or sore from the anesthesia or the breathing tube placed in your throat during surgery. If this causes discomfort, gargle with warm salt water. The discomfort should disappear within 24 hours.

## 2024-06-09 NOTE — Op Note (Signed)
 OPERATIVE NOTE  Amy Glass Date/Time of Admission: 06/09/2024  9:38 AM  CSN: [id number];FMW:[id number] Attending Provider: Luciano Standing, MD Room/Bed: [facility]/NONE DOB: [birth year] Age: 63 y.o.   Pre-Op Diagnosis: Dermatochalasis of both upper eyelids; Brow ptosis, bilateral  Post-Op Diagnosis: Dermatochalasis of both upper eyelids; Brow ptosis, bilateral  Procedure: Bilateral upper eyelid blepharoplasty CPT 15823  Anesthesia: Monitor Anesthesia Care  Surgeon(s): Standing KANDICE Luciano, MD  Staff: Circulator: Myrna Coots D, RN Scrub Person: Alto Charmaine PARAS  Implants: * No implants in log *  Specimens: * No specimens in log *  Complications: none  EBL: <5 ML  IVF: Per anesthesia ML  Condition: stable  Operative Findings:  Bilateral moderately severe recurrent upper eyelid dermatochalasis   Indications: This is a very pleasant 63 year old female with history of upper blepharoplasty and ptosis repair with Dr. Arelia 09/17/16 who presents with symptomatic recurrent upper eyelid dermatochalasis with documented visual field deficits. She presents for revision upper eyelid blepharoplasty  Informed consent obtained. RBA discussed including risks of pain, bleeding, infection, scarring, numbness, poor cosmesis, asymmetry, lagophthalmos, recurrent issue, need for further surgery, risks of anesthesia.   Description of Operation:  The patient was identified in the pre-operative area and consent confirmed in the chart.  The patient was brought to the operating room by the anesthetist and a preoperative huddle was performed confirming the patient's identity and procedure to be performed.  Once all were in agreement we proceed with surgery.  MAC anesthesia was induced the patient was turned 90 degrees from anesthetist.  Preoperative timeout was performed.  The patient's eyes were anesthetized with topical 0.5% tetracaine  eyedrops.  Next standard blepharoplasty incisions  were marked out in the supratarsal crease approximately 9 mm above the lash line bilaterally.  Brown forceps were used to form skin pinch technique to determine the amount of upper eyelid skin to remove.  Calipers were used to ensure at least 15 mm of upper eyelid skin was preserved bilaterally. The incisions were designed from the medial canthus into the crows feet rhytids.  Local anesthetic 1% lidocaine  with epinephrine  and sodium bicarbonate  was used to anesthetize the eyelids.  The patient was prepped and draped in standard sterile fashion for procedure of this kind.  Starting on the right side a 15 blade was used to perform the skin only upper eyelid blepharoplasty excision.  After skin was removed with Wescott scissors Bovie was used for hemostasis.  A cooled ice pack was placed along the eyelid and we proceeded to address the left side.  A 15 blade was used to make the upper blepharoplasty incision and a Wescott scissor was used to excise the skin only flap.  Bovie was used for hemostasis and ice pack was applied.  Next we proceeded with closure.  Both eyes were reapproximated with two simple interrupted 6-0 nylon sutures followed by 6-0 plain gut running suture.  There was ease of eye closure at the end of the procedure.  The eyes were dressed with antibiotic ointment and eyelids were cleansed with balanced salt solution. Tobradex  ointment was applied. Patient was then turned back to the anesthetist to awaken and brought in the recovery room in stable condition.    Standing KANDICE Luciano, MD [facility]  06/09/2024

## 2024-06-09 NOTE — H&P (Signed)
 Amy Glass is an 63 y.o. female.    Chief Complaint:  Dermatochalasis   HPI: Patient presents today for planned elective procedure. She denies any interval change in history since office visit on 04/20/24.   Past Medical History:  Diagnosis Date   ADHD (attention deficit hyperactivity disorder)    Allergy    Anxiety    as child    COVID 2020   mild   Depression    as child   Diabetes mellitus without complication (HCC) Dx 2003   Diabetes mellitus, type II (HCC)    Diverticulosis    GERD (gastroesophageal reflux disease)    Hiatal hernia    Hyperlipidemia    Hyperplastic colon polyp    Hypertension Dx 2003   Schatzki's ring     Past Surgical History:  Procedure Laterality Date   BACK SURGERY  2002   lumbar   BREAST BIOPSY Left 05/11/2018   NON-CASEATING GRANULOMATOUS INFLAMMATION   BROW LIFT Bilateral 09/17/2016   Procedure: BLEPHAROPLASTY;  Surgeon: Earlis Ranks, MD;  Location: [facility];  Service: Plastics;  Laterality: Bilateral;   CARPAL TUNNEL RELEASE Right    CARPAL TUNNEL RELEASE Left 06/2016   CESAREAN SECTION  05/20/2003   COLONOSCOPY     ESOPHAGEAL MANOMETRY N/A 10/25/2015   Procedure: ESOPHAGEAL MANOMETRY (EM);  Surgeon: Gordy CHRISTELLA Starch, MD;  Location: [facility];  Service: Gastroenterology;  Laterality: N/A;   POLYPECTOMY     PTOSIS REPAIR Bilateral 09/17/2016   Procedure: BILATERAL PTOSIS REPAIR EYELID WITH SUTURE TECHNIQUE, BILATERAL UPPER LID BLEPHAROPLASTY WITH EXCESS SKIN WEIGHING EYELID DOWN.;  Surgeon: Earlis Ranks, MD;  Location: [facility];  Service: Plastics;  Laterality: Bilateral;   SHOULDER SURGERY  08/2013   b/l shoulder    SHOULDER SURGERY Right 11/2015   TMJ ARTHROPLASTY Bilateral 01/21/2022   Procedure: TEMPOROMANDIBULAR JOINT (TMJ) ARTHROTOMY, MINISECTOMY WITH ABDOMINAL FAT GRAFT;  Surgeon: Sheryle Hamilton, DMD;  Location: [facility];  Service: Oral Surgery;  Laterality: Bilateral;   UPPER  GASTROINTESTINAL ENDOSCOPY      Family History  Problem Relation Age of Onset   Diabetes Mother    Heart disease Mother    Hyperlipidemia Mother    Renal cancer Maternal Aunt    Stomach cancer Maternal Uncle    Diabetes Maternal Grandmother    Hyperlipidemia Maternal Grandmother    Healthy Daughter    Healthy Daughter    Healthy Daughter    Colon cancer Neg Hx    Esophageal cancer Neg Hx    Rectal cancer Neg Hx    Breast cancer Neg Hx    Colon polyps Neg Hx     Social History:  reports that she has never smoked. She has never used smokeless tobacco. She reports that she does not drink alcohol  and does not use drugs.  Allergies:  Allergies  Allergen Reactions   Fenofibrate  Nausea Only    Stomach upset   Nitroglycerin  Other (See Comments)    Migraines, (only tried nitro patch. Has never tried the pills)   Oxycodone  Nausea Only    Medications Prior to Admission  Medication Sig Dispense Refill   amLODipine  (NORVASC ) 5 MG tablet Take 1 tablet (5 mg total) by mouth daily. 90 tablet 1   HYDROcodone -acetaminophen  (NORCO) 10-325 MG tablet Take 1 tablet by mouth every 6 (six) hours as needed.     losartan  (COZAAR ) 50 MG tablet Take 1 tablet (50 mg total) by mouth daily. 90 tablet 1   metFORMIN  (GLUCOPHAGE -XR)  500 MG 24 hr tablet Take 2 tablets (1,000 mg total) by mouth daily with breakfast.     traZODone  (DESYREL ) 100 MG tablet Take 1 tablet (100 mg total) by mouth at bedtime as needed for sleep. 90 tablet 2   ACCU-CHEK GUIDE test strip USE AS DIRECTED TO TEST BLOOD SUGAR ONCE DAILY 100 strip 3   Accu-Chek Softclix Lancets lancets Use to check blood sugar TID. E11.65 100 each 3   Blood Glucose Monitoring Suppl (ACCU-CHEK GUIDE) w/Device KIT Use to check blood sugar TID. E11.65 1 kit 0   Continuous Blood Gluc Receiver (FREESTYLE LIBRE 3 READER) DEVI 1 each by Does not apply route daily. 1 each 0   Insulin  Pen Needle (PEN NEEDLES) 32G X 4 MM MISC Use to inject insulin  once daily. 100  each 1   Semaglutide ,0.25 or 0.5MG /DOS, (OZEMPIC , 0.25 OR 0.5 MG/DOSE,) 2 MG/3ML SOPN Inject 0.5 mg into the skin once a week. 3 mL 2    Results for orders placed or performed in visit on 06/08/24 (from the past 48 hours)  POCT glucose (manual entry)     Status: Abnormal   Collection Time: 06/08/24  2:11 PM  Result Value Ref Range   POC Glucose 221 (A) 70 - 99 mg/dl  POCT glycosylated hemoglobin (Hb A1C)     Status: Normal   Collection Time: 06/08/24  2:15 PM  Result Value Ref Range   Hemoglobin A1C     HbA1c POC (<> result, manual entry)     HbA1c, POC (prediabetic range)     HbA1c, POC (controlled diabetic range) 6.4 0.0 - 7.0 %  Lipid panel     Status: Abnormal   Collection Time: 06/08/24  3:37 PM  Result Value Ref Range   Cholesterol, Total 271 (H) 100 - 199 mg/dL   Triglycerides 059 (HH) 0 - 149 mg/dL    Comment: Results confirmed on dilution.    HDL 28 (L) >39 mg/dL   VLDL Cholesterol Cal Comment (A) 5 - 40 mg/dL    Comment: The calculation for the VLDL cholesterol is not valid when triglyceride level is >800 mg/dL.    LDL Chol Calc (NIH) Comment (A) 0 - 99 mg/dL    Comment: Triglyceride result indicated is too high for an accurate LDL cholesterol estimation.    LDL CALC COMMENT: Comment     Comment: In the absence of the LDL-c value, if the Total Cholesterol (TC) is >260 mg/dL for those <83 years old or >290 for those >/=62 years old, consider evaluating for Familial Hypercholesterolemia(FH) if clinically indicated. If the TC is below these limits, the probability of FH cannot be determined.    Chol/HDL Ratio 9.7 (H) 0.0 - 4.4 ratio    Comment:                                   T. Chol/HDL Ratio                                             Men  Women                               1/2 Avg.Risk  3.4    3.3  Avg.Risk  5.0    4.4                                2X Avg.Risk  9.6    7.1                                3X Avg.Risk 23.4    11.0   Comprehensive metabolic panel with GFR     Status: Abnormal   Collection Time: 06/08/24  3:37 PM  Result Value Ref Range   Glucose 164 (H) 70 - 99 mg/dL   BUN 15 8 - 27 mg/dL   Creatinine, Ser 9.50 (L) 0.57 - 1.00 mg/dL   eGFR 893 >40 fO/fpw/8.26   BUN/Creatinine Ratio 31 (H) 12 - 28   Sodium 138 134 - 144 mmol/L   Potassium 4.4 3.5 - 5.2 mmol/L   Chloride 98 96 - 106 mmol/L   CO2 22 20 - 29 mmol/L   Calcium  9.7 8.7 - 10.3 mg/dL   Total Protein 7.2 6.0 - 8.5 g/dL   Albumin 4.6 3.9 - 4.9 g/dL   Globulin, Total 2.6 1.5 - 4.5 g/dL   Bilirubin Total <9.7 0.0 - 1.2 mg/dL   Alkaline Phosphatase 159 (H) 49 - 135 IU/L   AST 16 0 - 40 IU/L   ALT 14 0 - 32 IU/L  CBC     Status: None   Collection Time: 06/08/24  3:37 PM  Result Value Ref Range   WBC 6.5 3.4 - 10.8 x10E3/uL   RBC 4.79 3.77 - 5.28 x10E6/uL   Hemoglobin 13.5 11.1 - 15.9 g/dL   Hematocrit 58.5 65.9 - 46.6 %   MCV 86 79 - 97 fL   MCH 28.2 26.6 - 33.0 pg   MCHC 32.6 31.5 - 35.7 g/dL   RDW 86.5 88.2 - 84.5 %   Platelets 274 150 - 450 x10E3/uL   No results found.  ROS: negative other than stated in HPI  Blood pressure 112/74, pulse 78, temperature 97.8 F (36.6 C), temperature source Temporal, resp. rate 14, height 4' 11 (1.499 m), weight 58.9 kg, last menstrual period 05/30/2015, SpO2 100%.  PHYSICAL EXAM: General: Resting comfortably in NAD  Lungs: Non-labored respiratinos  Studies Reviewed: NA   Assessment/Plan Bilateral recurrent upper eyelid dermatochalasis  Proceed with revision upper eyelid blepharoplasty under MAC anesthesia. Informed consent obtained. RBA discussed.     Electronically signed by:  Elspeth Coddington, MD  Facial Plastic & Reconstructive Surgery Otolaryngology - Head and Neck Surgery [facility]  06/09/2024, 10:13 AM

## 2024-06-09 NOTE — Transfer of Care (Signed)
 Immediate Anesthesia Transfer of Care Note  Patient: Amy Glass  Procedure(s) Performed: BLEPHAROPLASTY (Bilateral: Eye)  Patient Location: PACU  Anesthesia Type:MAC  Level of Consciousness: awake, alert , and patient cooperative  Airway & Oxygen Therapy: Patient Spontanous Breathing and Patient connected to face mask oxygen  Post-op Assessment: Report given to RN and Post -op Vital signs reviewed and stable  Post vital signs: Reviewed and stable  Last Vitals:  Vitals Value Taken Time  BP 89/43 06/09/24 11:48  Temp    Pulse 73 06/09/24 11:49  Resp 16 06/09/24 11:49  SpO2 94 % 06/09/24 11:49  Vitals shown include unfiled device data.  Last Pain:  Vitals:   06/09/24 1003  TempSrc: Temporal  PainSc: 2       Patients Stated Pain Goal: 2 (06/09/24 1003)  Complications: No notable events documented.

## 2024-06-10 ENCOUNTER — Encounter (HOSPITAL_BASED_OUTPATIENT_CLINIC_OR_DEPARTMENT_OTHER): Payer: Self-pay | Admitting: Otolaryngology

## 2024-06-10 NOTE — Anesthesia Postprocedure Evaluation (Signed)
 Anesthesia Post Note  Patient: Amy Glass  Procedure(s) Performed: BLEPHAROPLASTY (Bilateral: Eye)     Patient location during evaluation: PACU Anesthesia Type: MAC Level of consciousness: awake Pain management: pain level controlled Vital Signs Assessment: post-procedure vital signs reviewed and stable Respiratory status: spontaneous breathing, nonlabored ventilation and respiratory function stable Cardiovascular status: blood pressure returned to baseline and stable Postop Assessment: no apparent nausea or vomiting Anesthetic complications: no   No notable events documented.  Last Vitals:  Vitals:   06/09/24 1315 06/09/24 1353  BP: (!) (P) 100/59 111/72  Pulse: 76 80  Resp: 12 16  Temp:  (!) 36.3 C  SpO2: 95% 97%    Last Pain:  Vitals:   06/10/24 0943  TempSrc:   PainSc: 0-No pain                 Yan Pankratz P Weston Kallman

## 2024-06-16 ENCOUNTER — Encounter: Payer: Self-pay | Admitting: Internal Medicine

## 2024-06-23 ENCOUNTER — Telehealth: Payer: Self-pay | Admitting: Internal Medicine

## 2024-06-23 DIAGNOSIS — E1169 Type 2 diabetes mellitus with other specified complication: Secondary | ICD-10-CM

## 2024-06-23 NOTE — Telephone Encounter (Signed)
 Copied from CRM #[id number]. Topic: Clinical - Medication Refill >> Jun 23, 2024 10:42 AM Berneda FALCON wrote: Medication: Semaglutide ,0.25 or 0.5MG /DOS, (OZEMPIC , 0.25 OR 0.5 MG/DOSE,) 2 MG/3ML SOPN-she is currently on the 0.5MG  dose  traZODone  (DESYREL ) 50 MG tablet (please note that this was previously prescribed by mental health PCP but she is unable to see this PCP anymore and would like to know if Dr. Vicci can prescribe this for her, please)  Has the patient contacted their pharmacy? Yes (Agent: If no, request that the patient contact the pharmacy for the refill. If patient does not wish to contact the pharmacy document the reason why and proceed with request.) (Agent: If yes, when and what did the pharmacy advise?)  This is the patient's preferred pharmacy:   [facility] [address] ([address]), [place] - [address] [place] ([place]) [place] [address] Phone: [phone] Fax: [phone]  Is this the correct pharmacy for this prescription? Yes If no, delete pharmacy and type the correct one.   Has the prescription been filled recently? No  Is the patient out of the medication? No  Has the patient been seen for an appointment in the last year OR does the patient have an upcoming appointment? Yes  Can we respond through MyChart? Yes  Agent: Please be advised that Rx refills may take up to 3 business days. We ask that you follow-up with your pharmacy.

## 2024-06-25 ENCOUNTER — Encounter: Payer: Self-pay | Admitting: Internal Medicine

## 2024-06-25 ENCOUNTER — Ambulatory Visit: Admitting: Internal Medicine

## 2024-06-25 VITALS — BP 107/63 | HR 76 | Temp 98.1°F | Resp 12 | Ht 59.0 in | Wt 127.0 lb

## 2024-06-25 DIAGNOSIS — D120 Benign neoplasm of cecum: Secondary | ICD-10-CM

## 2024-06-25 DIAGNOSIS — K5900 Constipation, unspecified: Secondary | ICD-10-CM

## 2024-06-25 DIAGNOSIS — D123 Benign neoplasm of transverse colon: Secondary | ICD-10-CM

## 2024-06-25 DIAGNOSIS — R194 Change in bowel habit: Secondary | ICD-10-CM

## 2024-06-25 DIAGNOSIS — R1032 Left lower quadrant pain: Secondary | ICD-10-CM

## 2024-06-25 MED ORDER — SODIUM CHLORIDE 0.9 % IV SOLN: 500.0000 mL | Freq: Once | INTRAVENOUS | Status: DC

## 2024-06-25 MED ORDER — OZEMPIC (0.25 OR 0.5 MG/DOSE) 2 MG/3ML ~~LOC~~ SOPN: 0.5000 mg | PEN_INJECTOR | SUBCUTANEOUS | 2 refills | Status: AC
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
## 2024-06-25 NOTE — Progress Notes (Signed)
 Transferred to PACU via stretcher.  Not responding to stimulation at this time.  VSS upon leaving procedure room.

## 2024-06-25 NOTE — Patient Instructions (Addendum)
 Resume previous diet Continue present medications Await pathology results Repeat colonoscopy at a future date to be determined after pathology results come back Dr Pamula nurse may call you to schedule an appointment in January, otherwise, call in a few weeks to make appointment with Dr Albertus for February, schedule is not open yet   Handouts/information given for polyps and diverticulosis  YOU HAD AN ENDOSCOPIC PROCEDURE TODAY AT THE  ENDOSCOPY CENTER:   Refer to the procedure report that was given to you for any specific questions about what was found during the examination.  If the procedure report does not answer your questions, please call your gastroenterologist to clarify.  If you requested that your care partner not be given the details of your procedure findings, then the procedure report has been included in a sealed envelope for you to review at your convenience later.  YOU SHOULD EXPECT: Some feelings of bloating in the abdomen. Passage of more gas than usual.  Walking can help get rid of the air that was put into your GI tract during the procedure and reduce the bloating. If you had a lower endoscopy (such as a colonoscopy or flexible sigmoidoscopy) you may notice spotting of blood in your stool or on the toilet paper. If you underwent a bowel prep for your procedure, you may not have a normal bowel movement for a few days.  Please Note:  You might notice some irritation and congestion in your nose or some drainage.  This is from the oxygen used during your procedure.  There is no need for concern and it should clear up in a day or so.  SYMPTOMS TO REPORT IMMEDIATELY:  Following lower endoscopy (colonoscopy):  Excessive amounts of blood in the stool  Significant tenderness or worsening of abdominal pains  Swelling of the abdomen that is new, acute  Fever of 100F or higher For urgent or emergent issues, a gastroenterologist can be reached at any hour by calling [phone]  [phone]. Do not use MyChart messaging for urgent concerns.   DIET:  We do recommend a small meal at first, but then you may proceed to your regular diet.  Drink plenty of fluids but you should avoid alcoholic beverages for 24 hours.  ACTIVITY:  You should plan to take it easy for the rest of today and you should NOT DRIVE or use heavy machinery until tomorrow (because of the sedation medicines used during the test).    FOLLOW UP: Our staff will call the number listed on your records the next business day following your procedure.  We will call around 7:15- 8:00 am to check on you and address any questions or concerns that you may have regarding the information given to you following your procedure. If we do not reach you, we will leave a message.     If any biopsies were taken you will be contacted by phone or by letter within the next 1-3 weeks.  Please call us  at [phone] if you have not heard about the biopsies in 3 weeks.   SIGNATURES/CONFIDENTIALITY: You and/or your care partner have signed paperwork which will be entered into your electronic medical record.  These signatures attest to the fact that that the information above on your After Visit Summary has been reviewed and is understood.  Full responsibility of the confidentiality of this discharge information lies with you and/or your care-partner.

## 2024-06-25 NOTE — Telephone Encounter (Signed)
 Requested Prescriptions  Pending Prescriptions Disp Refills   Semaglutide ,0.25 or 0.5MG /DOS, (OZEMPIC , 0.25 OR 0.5 MG/DOSE,) 2 MG/3ML SOPN 3 mL 2    Sig: Inject 0.5 mg into the skin once a week.     Endocrinology:  Diabetes - GLP-1 Receptor Agonists - semaglutide  Failed - 06/25/2024  5:10 PM      Failed - Cr in normal range and within 360 days    Creat  Date Value Ref Range Status  12/21/2015 0.56 0.50 - 1.05 mg/dL Final   Creatinine, Ser  Date Value Ref Range Status  06/08/2024 0.49 (L) 0.57 - 1.00 mg/dL Final         Passed - HBA1C in normal range and within 180 days    HbA1c, POC (controlled diabetic range)  Date Value Ref Range Status  06/08/2024 6.4 0.0 - 7.0 % Final         Passed - Valid encounter within last 6 months    Recent Outpatient Visits           2 weeks ago Type 2 diabetes mellitus with other specified complication, without long-term current use of insulin  (HCC)   [facility] [place]. [facility] Vicci Sober B, MD   4 months ago Type 2 diabetes mellitus with other specified complication, without long-term current use of insulin  [facility])   [place] [facility] [place]. [facility] Vicci Sober NOVAK, MD   5 months ago Polyuria   [place] [facility] [place]. [facility] [place], [place] W, NP   8 months ago Type 2 diabetes mellitus with other specified complication, without long-term current use of insulin  [facility])   [place] [facility] [place]. [facility] Vicci Sober NOVAK, MD   1 year ago Tremors of nervous system    [facility] [place] [facility] [place]. [facility] [place], [place]

## 2024-06-25 NOTE — Progress Notes (Signed)
 Updated medical record with pt, pt had brow lift 06/09/24

## 2024-06-25 NOTE — Progress Notes (Signed)
 Called to room to assist during endoscopic procedure.  Patient ID and intended procedure confirmed with present staff. Received instructions for my participation in the procedure from the performing physician.

## 2024-06-25 NOTE — Op Note (Signed)
 [facility] Patient Name: Amy Glass Procedure Date: 06/25/2024 12:48 PM MRN: [id number] Endoscopist: Gordy CHRISTELLA Starch , MD, [id number] Age: 63 Referring MD:  Date of Birth: Mar 26, 1961 Gender: Female Account #: [id number] Procedure:                Colonoscopy Indications:              Abdominal pain in the left lower quadrant, Change                            in bowel habits; hx of non-advanced adenoma; last                            colonoscopy Nov 2022 (TA x 1) Medicines:                Monitored Anesthesia Care Procedure:                Pre-Anesthesia Assessment:                           - Prior to the procedure, a History and Physical                            was performed, and patient medications and                            allergies were reviewed. The patient's tolerance of                            previous anesthesia was also reviewed. The risks                            and benefits of the procedure and the sedation                            options and risks were discussed with the patient.                            All questions were answered, and informed consent                            was obtained. Prior Anticoagulants: The patient has                            taken no anticoagulant or antiplatelet agents. ASA                            Grade Assessment: II - A patient with mild systemic                            disease. After reviewing the risks and benefits,                            the patient was deemed in satisfactory condition to  undergo the procedure.                           After obtaining informed consent, the colonoscope                            was passed under direct vision. Throughout the                            procedure, the patient's blood pressure, pulse, and                            oxygen saturations were monitored continuously. The                            Olympus Scope SN: [id number]  was introduced through                            the anus and advanced to the cecum, identified by                            appendiceal orifice and ileocecal valve. The                            colonoscopy was performed without difficulty. The                            patient tolerated the procedure well. The quality                            of the bowel preparation was good. The ileocecal                            valve, appendiceal orifice, and rectum were                            photographed. Scope In: 1:18:09 PM Scope Out: 1:32:09 PM Scope Withdrawal Time: 0 hours 10 minutes 18 seconds  Total Procedure Duration: 0 hours 14 minutes 0 seconds  Findings:                 The digital rectal exam was normal.                           A 4 mm polyp was found in the cecum. The polyp was                            sessile. The polyp was removed with a cold snare.                            Resection and retrieval were complete.                           A 3 mm polyp was found in the transverse colon. The  polyp was sessile. The polyp was removed with a                            cold snare. Resection and retrieval were complete.                           Multiple medium-mouthed and small-mouthed                            diverticula were found in the sigmoid colon,                            descending colon and ascending colon.                           The retroflexed view of the distal rectum and anal                            verge was normal and showed no anal or rectal                            abnormalities. Complications:            No immediate complications. Estimated Blood Loss:     Estimated blood loss: none. Impression:               - One 4 mm polyp in the cecum, removed with a cold                            snare. Resected and retrieved.                           - One 3 mm polyp in the transverse colon, removed                             with a cold snare. Resected and retrieved.                           - Moderate diverticulosis in the sigmoid colon, in                            the descending colon and in the ascending colon.                           - The distal rectum and anal verge are normal on                            retroflexion view. Recommendation:           - Patient has a contact number available for                            emergencies. The signs and symptoms of potential  delayed complications were discussed with the                            patient. Return to normal activities tomorrow.                            Written discharge instructions were provided to the                            patient.                           - Resume previous diet.                           - Continue present medications.                           - Await pathology results.                           - Office visit in Jan or Feb 2026 with me; abnormal                            Fibroscan = 18 kPa.                           - Repeat colonoscopy is recommended for                            surveillance. The colonoscopy date will be                            determined after pathology results from today's                            exam become available for review. Gordy CHRISTELLA Starch, MD 06/25/2024 1:36:13 PM This report has been signed electronically.

## 2024-06-28 ENCOUNTER — Telehealth: Payer: Self-pay

## 2024-06-28 NOTE — Telephone Encounter (Signed)
 Follow up call to pt, no answer.

## 2024-06-30 ENCOUNTER — Ambulatory Visit: Payer: Self-pay | Admitting: Internal Medicine

## 2024-06-30 ENCOUNTER — Telehealth: Payer: Self-pay

## 2024-06-30 LAB — SURGICAL PATHOLOGY

## 2024-06-30 NOTE — Telephone Encounter (Signed)
 Copied from CRM #[id number]. Topic: Clinical - Prescription Issue >> Jun 30, 2024 11:54 AM Hadassah PARAS wrote: Reason for CRM: Pt is following up on traZODone  (DESYREL ) 50 MG tablet. Please advise pt on # [id number]

## 2024-06-30 NOTE — Telephone Encounter (Signed)
 Routing to PCP for review.

## 2024-07-01 ENCOUNTER — Other Ambulatory Visit: Payer: Self-pay | Admitting: Internal Medicine

## 2024-07-01 NOTE — Telephone Encounter (Signed)
 Called & spoke to the patient. Verified name & DOB. Informed that per Dr.Johnson this was not a medication that I was prescribing for her. She discussed with her behavioral health provider. Patient expressed verbal understanding.

## 2024-07-01 NOTE — Telephone Encounter (Signed)
 This was not a medication that I was prescribing for her.  She discussed with her behavioral health provider.

## 2024-07-20 ENCOUNTER — Encounter (HOSPITAL_BASED_OUTPATIENT_CLINIC_OR_DEPARTMENT_OTHER): Payer: Self-pay

## 2024-07-20 NOTE — Progress Notes (Unsigned)
 " Cardiology Office Note   Date:  07/21/2024  ID:  Emrie, Amy Glass 10/21/1960, MRN [id number] PCP: [name], MD  [facility] Providers Cardiologist:  Vinie JAYSON Maxcy, MD     PMH Type 2 diabetes Hypertension Bipolar type 1 disorder Depression Dyslipidemia Coronary artery disease CCTA 03/05/2021 CAC score 53.6 (91st percentile) Mild mixed density plaque 25 to 49% in proximal LAD Minimal mixed density plaque < 25% in RCA   Referred to Advanced Lipid Disorders clinic and seen by Dr. Maxcy 05/2018.  History of high triglycerides. Family is from [place] and she has had triglycerides for many years, fatty liver disease, and elevated liver enzymes.  She also has history of diabetes and hypertension. Was on gemfibrozil  due to GI upset on fenofibrate .  She has difficulty swallowing the gemfibrozil  tablets due to their size.  History of esophageal dilation in the past.  She was following a low-fat diet and aiming to exercise regularly.  She was started on Vascepa  but was only taking 1 g twice daily.   She reported chest pain and had CCTA on 03/05/2021 which showed mild nonobstructive CAD.   Last clinic visit was 04/29/2022 with Josefa Beauvais, NP.  She reported significant dietary changes, regular exercise, and part-time work at [employer].  She continued to avoid fatty foods. No concerning cardiac symptoms.   She was notified on 03/23/2024 that she was in the essence research study and was receiving the study drug.  Lipid panel 06/08/2024 with total cholesterol 271, triglycerides 940, HDL 28, unable to calculate LDL  History of Present Illness Discussed the use of AI scribe software for clinical note transcription with the patient, who gave verbal consent to proceed.  History of Present Illness Amy Glass is a very pleasant 63 year old female who presents for follow-up of dyslipidemia. She has particularly elevated triglycerides with recent triglycerides  of 940 mg/dL.  No abdominal pain, nausea or vomiting.  She takes gemfibrozil  twice daily, which per record review she has taken in the past, however she thought this was a new medication.. She previously took Vascepa  but stopped because the capsules were too large to swallow. She did not tolerate fenofibrate . She also uses Ozempic  for weight management. She works as a conservation officer, nature and previously attended exercise classes that she found beneficial. She avoids fried foods and large amounts of rice, pasta, and sugary foods, and mainly eats chicken, soups, and whole wheat bread. She walks her dog for exercise. She denies chest pain, shortness of breath, edema, fatigue, palpitations, presyncope, syncope, orthopnea, and PND. She is tearful today and reports she is suffering from depression. She recently discontinued sertraline and buspirone because she feels that they were not effective.  She reports that she has a therapist whom she communicates well with.   ROS: See HPI  Studies Reviewed EKG Interpretation Date/Time:  Wednesday July 21 2024 11:32:31 EST Ventricular Rate:  87 PR Interval:  164 QRS Duration:  90 QT Interval:  364 QTC Calculation: 438 R Axis:   57  Text Interpretation: Normal sinus rhythm Normal ECG When compared with ECG of 28-May-2022 15:48, Criteria for Anterior infarct are no longer Present Confirmed by Percy Browning [phone]) on 07/21/2024 11:43:10 AM     No results found for: LIPOA  Risk Assessment/Calculations           Physical Exam VS:  BP 112/70   Pulse 87   Ht 4' 11 (1.499 m)   Wt 131 lb (59.4  kg)   LMP 05/30/2015   SpO2 98%   BMI 26.46 kg/m    Wt Readings from Last 3 Encounters:  07/21/24 131 lb (59.4 kg)  06/25/24 127 lb (57.6 kg)  06/09/24 129 lb 13.6 oz (58.9 kg)    GEN: Well nourished, well developed in no acute distress NECK: No JVD; No carotid bruits CARDIAC: RRR, no murmurs, rubs, gallops RESPIRATORY:  Clear to auscultation without rales,  wheezing or rhonchi  ABDOMEN: Soft, non-tender, non-distended EXTREMITIES:  No edema; No deformity   Assessment & Plan Hypertriglyceridemia   Dyslipidemia LDL goal < 70 Lipid panel completed 06/08/2024 with total cholesterol 271, triglycerides 940, HDL 28, and unable to calculate LDL.  History of severe triglyceridemia with improvement while on study drug in the essence trial.  She reports she recently started gemfibrozil  600 mg twice daily, however there is documentation that she took it in the past.  She does not specifically recall.  Reviewed concerning symptoms of pancreatitis to be aware of, he is currently asymptomatic. History of elevated liver enzymes which are currently normal. She does not want to retry Vascepa  due to difficulty swallowing.  Will try low-dose statin with close follow-up of liver enzymes.  Reviewed potential side effects of rosuvastatin to report. - Start rosuvastatin 5 mg daily - Continue gemfibrozil   - Recheck cholesterol levels in 2 to 3 months - Monitor for pancreatitis signs, such as abdominal pain - Encourage regular physical activity and dietary modifications  Hypertension BP initially elevated but improved on my recheck to 112/70 mmHg.  She denies concerning side effects with medications.  Renal function stable on labs completed 06/08/2024.  No change in antihypertensive therapy today. - Continue losartan , amlodipine  - Management per PCP  CAD Cardiac risk CT calcium  score of 53.6 (91st percentile) with mild mixed density plaque (25 to 49%) in proximal LAD, minimal mixed density plaque (<25%) in RCA. She denies chest pain, dyspnea, or other symptoms concerning for angina.  No indication for further ischemic evaluation at this time.  - Continue amlodipine , losartan , gemfibrozil  - We are adding rosuvastatin 5 mg daily for LDL goal 55 or lower and to potentially lower triglycerides - Be as physically active as possible every day and aim for at least 150 minutes  of moderate intensity exercise each week - Heart healthy diet avoiding processed foods, saturated fat, sugar, and other simple carbohydrates encouraged  Type 2 diabetes mellitus on insulin  A1C of 6.4% on labs 06/08/24, well controlled on current therapy. No acute concerns. - Management per PCP - Heart healthy diet avoiding processed foods, saturated fat, sugar, and other simple carbohydrates encouraged         Dispo: 3 months with Dr. Mona at [facility] office (closer to home)  Signed, Rosaline Bane, NP-C "

## 2024-07-21 ENCOUNTER — Other Ambulatory Visit (HOSPITAL_BASED_OUTPATIENT_CLINIC_OR_DEPARTMENT_OTHER): Payer: Self-pay | Admitting: *Deleted

## 2024-07-21 ENCOUNTER — Ambulatory Visit (INDEPENDENT_AMBULATORY_CARE_PROVIDER_SITE_OTHER): Admitting: Nurse Practitioner

## 2024-07-21 ENCOUNTER — Encounter (HOSPITAL_BASED_OUTPATIENT_CLINIC_OR_DEPARTMENT_OTHER): Payer: Self-pay | Admitting: Nurse Practitioner

## 2024-07-21 VITALS — BP 112/70 | HR 87 | Ht 59.0 in | Wt 131.0 lb

## 2024-07-21 DIAGNOSIS — Z7189 Other specified counseling: Secondary | ICD-10-CM

## 2024-07-21 DIAGNOSIS — I152 Hypertension secondary to endocrine disorders: Secondary | ICD-10-CM

## 2024-07-21 DIAGNOSIS — E1169 Type 2 diabetes mellitus with other specified complication: Secondary | ICD-10-CM

## 2024-07-21 DIAGNOSIS — I2510 Atherosclerotic heart disease of native coronary artery without angina pectoris: Secondary | ICD-10-CM | POA: Diagnosis not present

## 2024-07-21 DIAGNOSIS — E119 Type 2 diabetes mellitus without complications: Secondary | ICD-10-CM | POA: Diagnosis not present

## 2024-07-21 DIAGNOSIS — E1159 Type 2 diabetes mellitus with other circulatory complications: Secondary | ICD-10-CM

## 2024-07-21 DIAGNOSIS — E781 Pure hyperglyceridemia: Secondary | ICD-10-CM

## 2024-07-21 DIAGNOSIS — E785 Hyperlipidemia, unspecified: Secondary | ICD-10-CM

## 2024-07-21 MED ORDER — ROSUVASTATIN CALCIUM 5 MG PO TABS: 5.0000 mg | ORAL_TABLET | Freq: Every day | ORAL | 3 refills | Status: AC

## 2024-07-21 NOTE — Patient Instructions (Addendum)
"    Medication Instructions:   START Rosuvastatin one (1) tablet by mouth ( 5 mg) daily.   *If you need a refill on your cardiac medications before your next appointment, please call your pharmacy*  Lab Work:  Your physician recommends that you return for a FASTING direct ldl and NMR, in 3 months, fasting after midnight a week prior to your appointment with Dr. Mona. Pt given paperwork today.    If you have labs (blood work) drawn today and your tests are completely normal, you will receive your results only by: MyChart Message (if you have MyChart) OR A paper copy in the mail If you have any lab test that is abnormal or we need to change your treatment, we will call you to review the results.  Testing/Procedures:  None ordered.   Follow-Up: At [facility], you and your health needs are our priority.  As part of our continuing mission to provide you with exceptional heart care, our providers are all part of one team.  This team includes your primary Cardiologist (physician) and Advanced Practice Providers or APPs (Physician Assistants and Nurse Practitioners) who all work together to provide you with the care you need, when you need it.  Your next appointment:   3 month(s)  Provider:   Vinie JAYSON Mona, MD    We recommend signing up for the patient portal called MyChart.  Sign up information is provided on this After Visit Summary.  MyChart is used to connect with patients for Virtual Visits (Telemedicine).  Patients are able to view lab/test results, encounter notes, upcoming appointments, etc.  Non-urgent messages can be sent to your provider as well.   To learn more about what you can do with MyChart, go to [email or web address].             "

## 2024-07-23 ENCOUNTER — Other Ambulatory Visit: Payer: Self-pay | Admitting: Internal Medicine

## 2024-07-23 DIAGNOSIS — I152 Hypertension secondary to endocrine disorders: Secondary | ICD-10-CM

## 2024-07-23 NOTE — Telephone Encounter (Signed)
 Requested Prescriptions  Pending Prescriptions Disp Refills   losartan  (COZAAR ) 50 MG tablet [Pharmacy Med Name: LOSARTAN  POTASSIUM 50 MG TAB] 90 tablet 1    Sig: TAKE 1 TABLET BY MOUTH EVERY DAY     Cardiovascular:  Angiotensin Receptor Blockers Failed - 07/23/2024  1:52 PM      Failed - Cr in normal range and within 180 days    Creat  Date Value Ref Range Status  12/21/2015 0.56 0.50 - 1.05 mg/dL Final   Creatinine, Ser  Date Value Ref Range Status  06/08/2024 0.49 (L) 0.57 - 1.00 mg/dL Final         Passed - K in normal range and within 180 days    Potassium  Date Value Ref Range Status  06/08/2024 4.4 3.5 - 5.2 mmol/L Final         Passed - Patient is not pregnant      Passed - Last BP in normal range    BP Readings from Last 1 Encounters:  07/21/24 112/70         Passed - Valid encounter within last 6 months    Recent Outpatient Visits           1 month ago Type 2 diabetes mellitus with other specified complication, without long-term current use of insulin  (HCC)   [place] [facility] [place]. [facility] Vicci Sober B, MD   5 months ago Type 2 diabetes mellitus with other specified complication, without long-term current use of insulin  [facility])   [place] [facility] [place]. [facility] Vicci Sober NOVAK, MD   6 months ago Polyuria   [place] [facility] Of [place]. [facility] [place], [place] W, NP   9 months ago Type 2 diabetes mellitus with other specified complication, without long-term current use of insulin  [facility])   [place] [facility] Dept [facility] [place]. [facility] Vicci Sober NOVAK, MD   1 year ago Tremors of nervous system   [place] [facility] Dept Of [place]. [facility] [place], [place]

## 2024-08-11 ENCOUNTER — Other Ambulatory Visit: Payer: Self-pay | Admitting: Internal Medicine

## 2024-08-11 DIAGNOSIS — E1159 Type 2 diabetes mellitus with other circulatory complications: Secondary | ICD-10-CM

## 2024-08-11 MED ORDER — LOSARTAN POTASSIUM 50 MG PO TABS: 50.0000 mg | ORAL_TABLET | Freq: Every day | ORAL | 0 refills | Status: AC

## 2024-08-11 MED ORDER — GEMFIBROZIL 600 MG PO TABS: 600.0000 mg | ORAL_TABLET | Freq: Two times a day (BID) | ORAL | 0 refills | Status: AC

## 2024-08-11 MED ORDER — AMLODIPINE BESYLATE 5 MG PO TABS: 5.0000 mg | ORAL_TABLET | Freq: Every day | ORAL | 0 refills | Status: AC

## 2024-08-11 NOTE — Telephone Encounter (Signed)
 Appointment scheduled 10/07/24, change in pharmacy- will forward refills Requested Prescriptions  Pending Prescriptions Disp Refills   amLODipine  (NORVASC ) 5 MG tablet 90 tablet 1    Sig: Take 1 tablet (5 mg total) by mouth daily.     Cardiovascular: Calcium  Channel Blockers 2 Passed - 08/11/2024  2:03 PM      Passed - Last BP in normal range    BP Readings from Last 1 Encounters:  07/21/24 112/70         Passed - Last Heart Rate in normal range    Pulse Readings from Last 1 Encounters:  07/21/24 87         Passed - Valid encounter within last 6 months    Recent Outpatient Visits           2 months ago Type 2 diabetes mellitus with other specified complication, without long-term current use of insulin  (HCC)   [facility] [place]. [facility] Amy Sober Glass, Amy Glass   6 months ago Type 2 diabetes mellitus with other specified complication, without long-term current use of insulin  [facility])   [facility] [place] [facility] [place]. [facility] Amy Sober NOVAK, Amy Glass   7 months ago Polyuria   [place] [facility] [place]. [facility] [place], Amy Glass, Amy Glass   10 months ago Type 2 diabetes mellitus with other specified complication, without long-term current use of insulin  [facility])   [facility] [place]. [facility] Amy Sober NOVAK, Amy Glass   1 year ago Tremors of nervous system    [facility] [place]. [facility] Amy Pac, Amy Glass               gemfibrozil  (LOPID ) 600 MG tablet 180 tablet 0    Sig: Take 1 tablet (600 mg total) by mouth 2 (two) times daily before a meal.     Cardiovascular:  Antilipid - Fibric Acid Derivatives Failed - 08/11/2024  2:03 PM      Failed - Cr in normal range and within 360 days    Creat  Date Value Ref Range Status  12/21/2015 0.56 0.50 - 1.05 mg/dL Final   Creatinine,  Ser  Date Value Ref Range Status  06/08/2024 0.49 (L) 0.57 - 1.00 mg/dL Final         Failed - Lipid Panel in normal range within the last 12 months    Cholesterol, Total  Date Value Ref Range Status  06/08/2024 271 (H) 100 - 199 mg/dL Final   LDL Chol Calc (NIH)  Date Value Ref Range Status  06/08/2024 Comment (A) 0 - 99 mg/dL Final    Comment:    Triglyceride result indicated is too high for an accurate LDL cholesterol estimation.    Direct LDL  Date Value Ref Range Status  09/21/2019 89.0 0 - 99 mg/dL Final    Comment:    Performed at [facility], [address], [place], [place] [address]   HDL  Date Value Ref Range Status  06/08/2024 28 (L) >39 mg/dL Final   Triglycerides  Date Value Ref Range Status  06/08/2024 940 (HH) 0 - 149 mg/dL Final    Comment:    Results confirmed on dilution.          Passed - ALT in normal range  and within 360 days    ALT  Date Value Ref Range Status  06/08/2024 14 0 - 32 IU/L Final         Passed - AST in normal range and within 360 days    AST  Date Value Ref Range Status  06/08/2024 16 0 - 40 IU/L Final         Passed - HGB in normal range and within 360 days    Hemoglobin  Date Value Ref Range Status  06/08/2024 13.5 11.1 - 15.9 g/dL Final         Passed - HCT in normal range and within 360 days    Hematocrit  Date Value Ref Range Status  06/08/2024 41.4 34.0 - 46.6 % Final         Passed - PLT in normal range and within 360 days    Platelets  Date Value Ref Range Status  06/08/2024 274 150 - 450 x10E3/uL Final         Passed - WBC in normal range and within 360 days    WBC  Date Value Ref Range Status  06/08/2024 6.5 3.4 - 10.8 x10E3/uL Final  06/07/2023 6.4 4.0 - 10.5 K/uL Final         Passed - eGFR is 30 or above and within 360 days    GFR, Est African American  Date Value Ref Range Status  12/21/2015 >89 >=60 mL/min Final   GFR calc Af Amer  Date Value Ref Range Status  04/28/2020 109  >59 mL/min/1.73 Final    Comment:    **[facility] currently reports eGFR in compliance with the current**   recommendations of the [employer]. [facility] will   update reporting as new guidelines are published from the NKF-ASN   Task force.    GFR, Est Non African American  Date Value Ref Range Status  12/21/2015 >89 >=60 mL/min Final    Comment:      The estimated GFR is a calculation valid for adults (>=107 years old) that uses the CKD-EPI algorithm to adjust for age and sex. It is   not to be used for children, pregnant women, hospitalized patients,    patients on dialysis, or with rapidly changing kidney function. According to the NKDEP, eGFR >89 is normal, 60-89 shows mild impairment, 30-59 shows moderate impairment, 15-29 shows severe impairment and <15 is ESRD.      GFR, Estimated  Date Value Ref Range Status  06/07/2023 >60 >60 mL/min Final    Comment:    (NOTE) Calculated using the CKD-EPI Creatinine Equation (2021)    eGFR  Date Value Ref Range Status  06/08/2024 106 >59 mL/min/1.73 Final         Passed - Valid encounter within last 12 months    Recent Outpatient Visits           2 months ago Type 2 diabetes mellitus with other specified complication, without long-term current use of insulin  (HCC)   [place] [facility] [place]. [facility] Amy Sober Glass, Amy Glass   6 months ago Type 2 diabetes mellitus with other specified complication, without long-term current use of insulin  [facility])   [place] [facility] Of [place]. [facility] Amy Sober NOVAK, Amy Glass   7 months ago Polyuria   [place] [facility] [place]. [facility] Amy Haze ORN, Amy Glass   10  months ago Type 2 diabetes mellitus with other specified complication, without long-term current use of insulin  [facility])   [place] [facility] Of [place]. [facility] Amy Barnie NOVAK, Amy Glass   1 year ago Tremors of nervous system   [place] [facility] [facility] [place]. [facility] Amy Pac, Amy Glass               losartan  (COZAAR ) 50 MG tablet 90 tablet 1    Sig: Take 1 tablet (50 mg total) by mouth daily.     Cardiovascular:  Angiotensin Receptor Blockers Failed - 08/11/2024  2:03 PM      Failed - Cr in normal range and within 180 days    Creat  Date Value Ref Range Status  12/21/2015 0.56 0.50 - 1.05 mg/dL Final   Creatinine, Ser  Date Value Ref Range Status  06/08/2024 0.49 (L) 0.57 - 1.00 mg/dL Final         Passed - K in normal range and within 180 days    Potassium  Date Value Ref Range Status  06/08/2024 4.4 3.5 - 5.2 mmol/L Final         Passed - Patient is not pregnant      Passed - Last BP in normal range    BP Readings from Last 1 Encounters:  07/21/24 112/70         Passed - Valid encounter within last 6 months    Recent Outpatient Visits           2 months ago Type 2 diabetes mellitus with other specified complication, without long-term current use of insulin  (HCC)   [place] [facility] [place]. [facility] Amy Barnie Glass, Amy Glass   6 months ago Type 2 diabetes mellitus with other specified complication, without long-term current use of insulin  [facility])   [place] [facility] [place]. [facility] Amy Barnie NOVAK, Amy Glass   7 months ago Polyuria   [place] [facility] [place] Of [place]. [facility] Amy Glass, Amy Glass, Amy Glass   10 months ago Type 2 diabetes mellitus with other specified complication, without long-term current use of insulin  [facility])   [place] [facility] [place]. [facility] Amy Barnie NOVAK, Amy Glass   1 year ago Tremors of nervous system   [place] [facility] [place] [facility] [place]. [facility] Amy Glass, Amy Glass

## 2024-08-11 NOTE — Telephone Encounter (Signed)
 Copied from CRM #[id number]. Topic: Clinical - Medication Refill >> Aug 11, 2024  9:31 AM Zebedee SAUNDERS wrote: Medication: amLODipine  (NORVASC ) 5 MG tablet,gemfibrozil  (LOPID ) 600 MG tablet, losartan  (COZAAR ) 50 MG tablet  Has the patient contacted their pharmacy? Yes (Agent: If no, request that the patient contact the pharmacy for the refill. If patient does not wish to contact the pharmacy document the reason why and proceed with request.) (Agent: If yes, when and what did the pharmacy advise?)Pharmacy need script  This is the patient's preferred pharmacy:   [facility] [address] ([address]), [place] - [address] [place] ([place]) [place] [address] Phone: [phone] Fax: [phone]  Is this the correct pharmacy for this prescription? Yes If no, delete pharmacy and type the correct one.   Has the prescription been filled recently? Yes  Is the patient out of the medication? Yes  Has the patient been seen for an appointment in the last year OR does the patient have an upcoming appointment? Yes  Can we respond through MyChart? Yes  Agent: Please be advised that Rx refills may take up to 3 business days. We ask that you follow-up with your pharmacy.

## 2024-09-01 ENCOUNTER — Ambulatory Visit: Admitting: Internal Medicine

## 2024-10-07 ENCOUNTER — Ambulatory Visit: Payer: Self-pay | Admitting: Internal Medicine

## 2024-10-14 ENCOUNTER — Ambulatory Visit: Admitting: Internal Medicine

## 2024-10-26 ENCOUNTER — Ambulatory Visit: Payer: Self-pay
